# Patient Record
Sex: Male | Born: 1940 | Race: White | Hispanic: No | Marital: Married | Smoking: Former smoker
Health system: Southern US, Community
[De-identification: ages and names within clinical notes are randomized; demographics above are authoritative.]

## PROBLEM LIST (undated history)

## (undated) DIAGNOSIS — N529 Male erectile dysfunction, unspecified: Secondary | ICD-10-CM

## (undated) DIAGNOSIS — Z7901 Long term (current) use of anticoagulants: Secondary | ICD-10-CM

## (undated) DIAGNOSIS — E291 Testicular hypofunction: Secondary | ICD-10-CM

## (undated) DIAGNOSIS — Z85828 Personal history of other malignant neoplasm of skin: Secondary | ICD-10-CM

## (undated) DIAGNOSIS — M1990 Unspecified osteoarthritis, unspecified site: Secondary | ICD-10-CM

## (undated) DIAGNOSIS — Z86010 Personal history of colonic polyps: Secondary | ICD-10-CM

## (undated) DIAGNOSIS — Z860101 Personal history of adenomatous and serrated colon polyps: Secondary | ICD-10-CM

## (undated) DIAGNOSIS — Z8669 Personal history of other diseases of the nervous system and sense organs: Secondary | ICD-10-CM

## (undated) DIAGNOSIS — C439 Malignant melanoma of skin, unspecified: Secondary | ICD-10-CM

## (undated) DIAGNOSIS — Z87442 Personal history of urinary calculi: Secondary | ICD-10-CM

## (undated) DIAGNOSIS — G4733 Obstructive sleep apnea (adult) (pediatric): Secondary | ICD-10-CM

## (undated) DIAGNOSIS — Z974 Presence of external hearing-aid: Secondary | ICD-10-CM

## (undated) DIAGNOSIS — Z9989 Dependence on other enabling machines and devices: Secondary | ICD-10-CM

## (undated) DIAGNOSIS — I351 Nonrheumatic aortic (valve) insufficiency: Secondary | ICD-10-CM

## (undated) DIAGNOSIS — IMO0001 Reserved for inherently not codable concepts without codable children: Secondary | ICD-10-CM

## (undated) DIAGNOSIS — I809 Phlebitis and thrombophlebitis of unspecified site: Secondary | ICD-10-CM

## (undated) DIAGNOSIS — N401 Enlarged prostate with lower urinary tract symptoms: Secondary | ICD-10-CM

## (undated) DIAGNOSIS — I499 Cardiac arrhythmia, unspecified: Secondary | ICD-10-CM

## (undated) DIAGNOSIS — I4819 Other persistent atrial fibrillation: Secondary | ICD-10-CM

## (undated) DIAGNOSIS — T148XXA Other injury of unspecified body region, initial encounter: Secondary | ICD-10-CM

## (undated) DIAGNOSIS — D126 Benign neoplasm of colon, unspecified: Secondary | ICD-10-CM

## (undated) DIAGNOSIS — I77810 Thoracic aortic ectasia: Secondary | ICD-10-CM

## (undated) DIAGNOSIS — G709 Myoneural disorder, unspecified: Secondary | ICD-10-CM

## (undated) DIAGNOSIS — J189 Pneumonia, unspecified organism: Secondary | ICD-10-CM

## (undated) DIAGNOSIS — J302 Other seasonal allergic rhinitis: Secondary | ICD-10-CM

## (undated) DIAGNOSIS — I440 Atrioventricular block, first degree: Secondary | ICD-10-CM

## (undated) DIAGNOSIS — I10 Essential (primary) hypertension: Secondary | ICD-10-CM

## (undated) HISTORY — DX: Obstructive sleep apnea (adult) (pediatric): G47.33

## (undated) HISTORY — PX: TONSILLECTOMY: SUR1361

## (undated) HISTORY — DX: Benign neoplasm of colon, unspecified: D12.6

## (undated) HISTORY — DX: Nonrheumatic aortic (valve) insufficiency: I35.1

## (undated) HISTORY — PX: LITHOTRIPSY: SUR834

## (undated) HISTORY — PX: CATARACT EXTRACTION: SUR2

## (undated) HISTORY — PX: CYSTOSCOPY: SUR368

## (undated) HISTORY — PX: AMPUTATION FINGER: SHX6594

## (undated) HISTORY — DX: Unspecified osteoarthritis, unspecified site: M19.90

## (undated) HISTORY — DX: Other seasonal allergic rhinitis: J30.2

## (undated) HISTORY — PX: CATARACT EXTRACTION W/ INTRAOCULAR LENS IMPLANT: SHX1309

## (undated) HISTORY — DX: Malignant melanoma of skin, unspecified: C43.9

## (undated) HISTORY — PX: UPPER GI ENDOSCOPY: SHX6162

## (undated) HISTORY — DX: Other persistent atrial fibrillation: I48.19

## (undated) HISTORY — DX: Essential (primary) hypertension: I10

---

## 1968-02-12 HISTORY — PX: TONSILLECTOMY: SUR1361

## 1982-02-11 HISTORY — PX: ELBOW SURGERY: SHX618

## 1998-02-11 DIAGNOSIS — C439 Malignant melanoma of skin, unspecified: Secondary | ICD-10-CM

## 1998-02-11 DIAGNOSIS — Z85820 Personal history of malignant melanoma of skin: Secondary | ICD-10-CM

## 1998-02-11 HISTORY — DX: Personal history of malignant melanoma of skin: Z85.820

## 1998-02-11 HISTORY — PX: OTHER SURGICAL HISTORY: SHX169

## 1998-02-11 HISTORY — DX: Malignant melanoma of skin, unspecified: C43.9

## 1998-03-10 ENCOUNTER — Other Ambulatory Visit: Admission: RE | Admit: 1998-03-10 | Discharge: 1998-03-10 | Payer: Self-pay

## 1998-08-23 ENCOUNTER — Encounter (INDEPENDENT_AMBULATORY_CARE_PROVIDER_SITE_OTHER): Payer: Self-pay | Admitting: Specialist

## 1998-08-23 ENCOUNTER — Other Ambulatory Visit: Admission: RE | Admit: 1998-08-23 | Discharge: 1998-08-23 | Payer: Self-pay | Admitting: Gastroenterology

## 2000-01-12 HISTORY — PX: MELANOMA EXCISION: SHX5266

## 2000-01-28 ENCOUNTER — Ambulatory Visit (HOSPITAL_BASED_OUTPATIENT_CLINIC_OR_DEPARTMENT_OTHER): Admission: RE | Admit: 2000-01-28 | Discharge: 2000-01-28 | Payer: Self-pay | Admitting: *Deleted

## 2000-01-28 ENCOUNTER — Encounter (INDEPENDENT_AMBULATORY_CARE_PROVIDER_SITE_OTHER): Payer: Self-pay | Admitting: *Deleted

## 2001-07-12 HISTORY — PX: MASS EXCISION: SHX2000

## 2002-03-10 ENCOUNTER — Encounter: Admission: RE | Admit: 2002-03-10 | Discharge: 2002-03-10 | Payer: Self-pay | Admitting: Sports Medicine

## 2002-03-29 ENCOUNTER — Ambulatory Visit (HOSPITAL_BASED_OUTPATIENT_CLINIC_OR_DEPARTMENT_OTHER): Admission: RE | Admit: 2002-03-29 | Discharge: 2002-03-29 | Payer: Self-pay | Admitting: Sports Medicine

## 2002-12-20 ENCOUNTER — Encounter: Admission: RE | Admit: 2002-12-20 | Discharge: 2002-12-20 | Payer: Self-pay | Admitting: Family Medicine

## 2002-12-20 ENCOUNTER — Encounter: Admission: RE | Admit: 2002-12-20 | Discharge: 2002-12-20 | Payer: Self-pay | Admitting: Sports Medicine

## 2002-12-21 ENCOUNTER — Encounter: Admission: RE | Admit: 2002-12-21 | Discharge: 2002-12-21 | Payer: Self-pay | Admitting: Family Medicine

## 2002-12-28 ENCOUNTER — Encounter: Admission: RE | Admit: 2002-12-28 | Discharge: 2002-12-28 | Payer: Self-pay | Admitting: Family Medicine

## 2003-03-15 ENCOUNTER — Encounter: Admission: RE | Admit: 2003-03-15 | Discharge: 2003-03-15 | Payer: Self-pay | Admitting: Sports Medicine

## 2003-03-21 ENCOUNTER — Encounter: Admission: RE | Admit: 2003-03-21 | Discharge: 2003-03-21 | Payer: Self-pay | Admitting: Family Medicine

## 2003-05-05 ENCOUNTER — Ambulatory Visit (HOSPITAL_COMMUNITY): Admission: RE | Admit: 2003-05-05 | Discharge: 2003-05-05 | Payer: Self-pay | Admitting: Urology

## 2003-05-08 ENCOUNTER — Observation Stay (HOSPITAL_COMMUNITY): Admission: EM | Admit: 2003-05-08 | Discharge: 2003-05-09 | Payer: Self-pay | Admitting: Emergency Medicine

## 2003-05-12 ENCOUNTER — Ambulatory Visit (HOSPITAL_COMMUNITY): Admission: RE | Admit: 2003-05-12 | Discharge: 2003-05-12 | Payer: Self-pay | Admitting: Urology

## 2003-05-12 HISTORY — PX: CYSTOSCOPY W/ URETEROSCOPY W/ LITHOTRIPSY: SUR380

## 2004-01-17 ENCOUNTER — Ambulatory Visit: Payer: Self-pay | Admitting: Family Medicine

## 2004-03-06 ENCOUNTER — Ambulatory Visit: Payer: Self-pay | Admitting: Sports Medicine

## 2004-04-04 ENCOUNTER — Ambulatory Visit: Payer: Self-pay | Admitting: Sports Medicine

## 2004-04-18 ENCOUNTER — Ambulatory Visit: Payer: Self-pay | Admitting: Sports Medicine

## 2004-10-04 ENCOUNTER — Ambulatory Visit: Payer: Self-pay | Admitting: Gastroenterology

## 2004-10-17 ENCOUNTER — Ambulatory Visit: Payer: Self-pay | Admitting: Gastroenterology

## 2004-12-26 ENCOUNTER — Ambulatory Visit: Payer: Self-pay | Admitting: Family Medicine

## 2005-01-18 ENCOUNTER — Ambulatory Visit (HOSPITAL_BASED_OUTPATIENT_CLINIC_OR_DEPARTMENT_OTHER): Admission: RE | Admit: 2005-01-18 | Discharge: 2005-01-18 | Payer: Self-pay | Admitting: Urology

## 2005-01-18 ENCOUNTER — Ambulatory Visit (HOSPITAL_COMMUNITY): Admission: RE | Admit: 2005-01-18 | Discharge: 2005-01-18 | Payer: Self-pay | Admitting: Urology

## 2005-01-18 ENCOUNTER — Encounter (INDEPENDENT_AMBULATORY_CARE_PROVIDER_SITE_OTHER): Payer: Self-pay | Admitting: Specialist

## 2005-01-18 HISTORY — PX: SATURATION BIOPSY OF PROSTATE: SHX2375

## 2005-01-20 ENCOUNTER — Inpatient Hospital Stay (HOSPITAL_COMMUNITY): Admission: EM | Admit: 2005-01-20 | Discharge: 2005-01-24 | Payer: Self-pay | Admitting: Emergency Medicine

## 2005-03-26 ENCOUNTER — Ambulatory Visit: Payer: Self-pay | Admitting: Sports Medicine

## 2005-04-19 ENCOUNTER — Ambulatory Visit: Payer: Self-pay | Admitting: Family Medicine

## 2005-04-24 ENCOUNTER — Ambulatory Visit: Payer: Self-pay | Admitting: Family Medicine

## 2005-04-30 ENCOUNTER — Ambulatory Visit: Payer: Self-pay | Admitting: Sports Medicine

## 2005-05-07 ENCOUNTER — Ambulatory Visit: Payer: Self-pay | Admitting: Sports Medicine

## 2005-09-03 ENCOUNTER — Ambulatory Visit: Payer: Self-pay | Admitting: Family Medicine

## 2005-10-22 ENCOUNTER — Ambulatory Visit: Payer: Self-pay | Admitting: Sports Medicine

## 2005-10-22 ENCOUNTER — Ambulatory Visit (HOSPITAL_COMMUNITY): Admission: RE | Admit: 2005-10-22 | Discharge: 2005-10-22 | Payer: Self-pay | Admitting: Family Medicine

## 2006-04-10 DIAGNOSIS — H269 Unspecified cataract: Secondary | ICD-10-CM | POA: Insufficient documentation

## 2006-04-10 DIAGNOSIS — K5732 Diverticulitis of large intestine without perforation or abscess without bleeding: Secondary | ICD-10-CM | POA: Insufficient documentation

## 2006-04-10 DIAGNOSIS — D126 Benign neoplasm of colon, unspecified: Secondary | ICD-10-CM | POA: Insufficient documentation

## 2006-04-10 DIAGNOSIS — C439 Malignant melanoma of skin, unspecified: Secondary | ICD-10-CM | POA: Insufficient documentation

## 2006-04-10 DIAGNOSIS — N200 Calculus of kidney: Secondary | ICD-10-CM | POA: Insufficient documentation

## 2006-05-06 ENCOUNTER — Ambulatory Visit: Payer: Self-pay | Admitting: Sports Medicine

## 2006-05-06 DIAGNOSIS — R972 Elevated prostate specific antigen [PSA]: Secondary | ICD-10-CM | POA: Insufficient documentation

## 2006-05-06 DIAGNOSIS — I10 Essential (primary) hypertension: Secondary | ICD-10-CM | POA: Insufficient documentation

## 2006-05-08 LAB — CONVERTED CEMR LAB
ALT: 20 units/L (ref 0–53)
AST: 20 units/L (ref 0–37)
Albumin: 4.5 g/dL (ref 3.5–5.2)
Alkaline Phosphatase: 85 units/L (ref 39–117)
Calcium: 9.8 mg/dL (ref 8.4–10.5)
Cholesterol: 215 mg/dL — ABNORMAL HIGH (ref 0–200)
PSA: 2.58 ng/mL (ref 0.10–4.00)
Potassium: 4.3 meq/L (ref 3.5–5.3)
Sodium: 138 meq/L (ref 135–145)
Total Bilirubin: 0.6 mg/dL (ref 0.3–1.2)
Total CHOL/HDL Ratio: 4.1

## 2006-05-22 ENCOUNTER — Encounter (INDEPENDENT_AMBULATORY_CARE_PROVIDER_SITE_OTHER): Payer: Self-pay | Admitting: Sports Medicine

## 2006-06-03 ENCOUNTER — Encounter (INDEPENDENT_AMBULATORY_CARE_PROVIDER_SITE_OTHER): Payer: Self-pay | Admitting: Sports Medicine

## 2006-06-03 ENCOUNTER — Ambulatory Visit: Payer: Self-pay | Admitting: Family Medicine

## 2006-06-03 LAB — CONVERTED CEMR LAB
AST: 19 units/L (ref 0–37)
Albumin: 4.6 g/dL (ref 3.5–5.2)
Basophils Absolute: <0.2 10*3/uL
CO2: 23 meq/L (ref 19–32)
Creatinine, Ser: 1 mg/dL (ref 0.40–1.50)
Granulocyte count absolute: 5.9 10*3/uL
Granulocyte percent: 69.8 %
HCT: 41.5 %
Hemoglobin: 14.6 g/dL
Lymphs Abs: 2 10*3/uL
MCV: 92.6 fL
Monocytes Absolute: 0.5 10*3/uL
Potassium: 5 meq/L (ref 3.5–5.3)

## 2006-06-10 ENCOUNTER — Telehealth (INDEPENDENT_AMBULATORY_CARE_PROVIDER_SITE_OTHER): Payer: Self-pay | Admitting: Sports Medicine

## 2006-10-09 ENCOUNTER — Ambulatory Visit: Payer: Self-pay | Admitting: Gastroenterology

## 2006-10-21 ENCOUNTER — Ambulatory Visit: Payer: Self-pay | Admitting: Gastroenterology

## 2007-07-13 ENCOUNTER — Ambulatory Visit: Payer: Self-pay | Admitting: Family Medicine

## 2007-07-13 DIAGNOSIS — M479 Spondylosis, unspecified: Secondary | ICD-10-CM | POA: Insufficient documentation

## 2007-07-14 ENCOUNTER — Telehealth: Payer: Self-pay | Admitting: *Deleted

## 2007-07-14 ENCOUNTER — Telehealth: Payer: Self-pay | Admitting: Family Medicine

## 2007-07-14 ENCOUNTER — Encounter: Payer: Self-pay | Admitting: Family Medicine

## 2007-07-14 DIAGNOSIS — D2920 Benign neoplasm of unspecified testis: Secondary | ICD-10-CM | POA: Insufficient documentation

## 2007-07-14 LAB — CONVERTED CEMR LAB
AST: 20 units/L (ref 0–37)
Albumin: 4.5 g/dL (ref 3.5–5.2)
Glucose, Bld: 103 mg/dL — ABNORMAL HIGH (ref 70–99)
HDL: 49 mg/dL (ref >39)
LDL Cholesterol: 122 mg/dL — ABNORMAL HIGH (ref 0–99)
Potassium: 5.1 meq/L (ref 3.5–5.3)
Sodium: 140 meq/L (ref 135–145)
Total Bilirubin: 0.7 mg/dL (ref 0.3–1.2)
Total Protein: 7.4 g/dL (ref 6.0–8.3)

## 2007-07-15 ENCOUNTER — Encounter: Payer: Self-pay | Admitting: Family Medicine

## 2007-07-16 LAB — CONVERTED CEMR LAB: PSA, Free: 1 ng/mL

## 2007-08-27 ENCOUNTER — Encounter: Payer: Self-pay | Admitting: Family Medicine

## 2007-12-08 ENCOUNTER — Telehealth: Payer: Self-pay | Admitting: *Deleted

## 2008-05-13 ENCOUNTER — Ambulatory Visit: Payer: Self-pay | Admitting: Family Medicine

## 2008-05-24 ENCOUNTER — Ambulatory Visit: Payer: Self-pay | Admitting: Family Medicine

## 2008-05-27 ENCOUNTER — Encounter: Payer: Self-pay | Admitting: *Deleted

## 2008-06-20 ENCOUNTER — Ambulatory Visit: Payer: Self-pay | Admitting: Gastroenterology

## 2008-06-20 DIAGNOSIS — R1013 Epigastric pain: Secondary | ICD-10-CM | POA: Insufficient documentation

## 2008-06-20 DIAGNOSIS — K222 Esophageal obstruction: Secondary | ICD-10-CM | POA: Insufficient documentation

## 2008-06-20 LAB — CONVERTED CEMR LAB
Albumin: 3.8 g/dL (ref 3.5–5.2)
BUN: 14 mg/dL (ref 6–23)
Basophils Relative: 0.7 % (ref 0.0–3.0)
Chloride: 107 meq/L (ref 96–112)
Creatinine, Ser: 1 mg/dL (ref 0.4–1.5)
GFR calc non Af Amer: 79.05 mL/min (ref >60)
Glucose, Bld: 92 mg/dL (ref 70–99)
Hemoglobin: 13.7 g/dL (ref 13.0–17.0)
Lymphs Abs: 1.6 10*3/uL (ref 0.7–4.0)
MCHC: 35.4 g/dL (ref 30.0–36.0)
Neutro Abs: 2.3 10*3/uL (ref 1.4–7.7)
Platelets: 188 10*3/uL (ref 150.0–400.0)
Potassium: 4.5 meq/L (ref 3.5–5.1)
RBC: 4.19 M/uL — ABNORMAL LOW (ref 4.22–5.81)
Sodium: 142 meq/L (ref 135–145)
Total Bilirubin: 0.7 mg/dL (ref 0.3–1.2)
WBC: 4.7 10*3/uL (ref 4.5–10.5)

## 2008-06-21 ENCOUNTER — Ambulatory Visit: Payer: Self-pay | Admitting: Gastroenterology

## 2008-06-21 ENCOUNTER — Encounter: Payer: Self-pay | Admitting: Gastroenterology

## 2008-06-24 ENCOUNTER — Encounter: Payer: Self-pay | Admitting: Gastroenterology

## 2008-06-24 ENCOUNTER — Telehealth: Payer: Self-pay | Admitting: Gastroenterology

## 2008-07-06 ENCOUNTER — Telehealth (INDEPENDENT_AMBULATORY_CARE_PROVIDER_SITE_OTHER): Payer: Self-pay | Admitting: *Deleted

## 2008-07-06 ENCOUNTER — Ambulatory Visit: Payer: Self-pay | Admitting: Family Medicine

## 2008-07-12 ENCOUNTER — Ambulatory Visit: Payer: Self-pay | Admitting: Gastroenterology

## 2008-07-15 ENCOUNTER — Encounter: Payer: Self-pay | Admitting: Family Medicine

## 2008-08-10 ENCOUNTER — Ambulatory Visit: Payer: Self-pay | Admitting: Family Medicine

## 2008-08-10 LAB — CONVERTED CEMR LAB: PSA: 2.53 ng/mL (ref 0.10–4.00)

## 2008-08-11 ENCOUNTER — Encounter: Payer: Self-pay | Admitting: Family Medicine

## 2009-04-07 ENCOUNTER — Encounter: Payer: Self-pay | Admitting: Family Medicine

## 2009-10-04 ENCOUNTER — Ambulatory Visit: Payer: Self-pay | Admitting: Family Medicine

## 2009-10-04 DIAGNOSIS — M79609 Pain in unspecified limb: Secondary | ICD-10-CM | POA: Insufficient documentation

## 2009-10-04 DIAGNOSIS — R5383 Other fatigue: Secondary | ICD-10-CM

## 2009-10-04 DIAGNOSIS — R5381 Other malaise: Secondary | ICD-10-CM | POA: Insufficient documentation

## 2009-10-04 LAB — CONVERTED CEMR LAB
ALT: 19 units/L (ref 0–53)
AST: 29 units/L (ref 0–37)
Cholesterol: 193 mg/dL (ref 0–200)
HDL: 51 mg/dL (ref >39)
Hemoglobin: 14.9 g/dL (ref 13.0–17.0)
LDL Cholesterol: 115 mg/dL — ABNORMAL HIGH (ref 0–99)
MCHC: 35 g/dL (ref 30.0–36.0)
MCV: 90.1 fL (ref 78.0–100.0)
Platelets: 200 10*3/uL (ref 150–400)
Potassium: 4.2 meq/L (ref 3.5–5.3)
RBC: 4.73 M/uL (ref 4.22–5.81)
Sodium: 137 meq/L (ref 135–145)
Total CHOL/HDL Ratio: 3.8
Triglycerides: 136 mg/dL (ref <150)

## 2009-10-05 ENCOUNTER — Telehealth: Payer: Self-pay | Admitting: *Deleted

## 2009-10-06 ENCOUNTER — Encounter: Payer: Self-pay | Admitting: Family Medicine

## 2009-12-04 ENCOUNTER — Encounter: Payer: Self-pay | Admitting: Family Medicine

## 2009-12-20 ENCOUNTER — Encounter: Payer: Self-pay | Admitting: Family Medicine

## 2010-02-11 HISTORY — PX: TENDON REPAIR: SHX5111

## 2010-03-08 ENCOUNTER — Other Ambulatory Visit: Payer: Self-pay | Admitting: Dermatology

## 2010-03-15 NOTE — Assessment & Plan Note (Signed)
Summary: Mario [name],df   Vital Signs:  Patient profile:   70 year old male Weight:      206.6 pounds Temp:     97.8 degrees F oral Pulse rate:   63 / minute Pulse rhythm:   regular BP sitting:   152 / 95  (left arm) Cuff size:   large  Vitals Entered By: Loralee Pacas CMA (October 04, 2009 10:02 AM)    Primary Care Provider:  Albertha Ghee MD   History of Present Illness: 1) Here for check up  2) BP readings at home 135-145 / 80s. Has started training hard again as he wants to compete   one last time in weight lifting---feels this may be contributing to his elevated levels. No chest pains, no SOB. Is not running rigt now but has no exertional pain with his weigt lifting. We had him on benazepril last--he thought that may have contributed to his stomach issues--was previously on tenormin--did well with that.  3) had right triceps injury (sounds like avulsion of insertion while doing tricep extensions with 175 punds).. At the moment the triceps ruptured, he felt instant pain in his right hand. Was seen by Dr Eulah Pont who did surgery on his arm--he still has hand pain, loss of dexterity and notes hand swelling daily. Wants to see a Hydrographic surveyor.  4) Has nothad his PSA checked recently--asymptomatic. (was supposed to have a 6 m check with [place] before his f/u at [facility])  5) follow with dermatology for complete skin check every 4 months or so for his hx melanoma, BCC and one SCC.  6) follows with urology yearly at [facility]  Habits & Providers  Alcohol-Tobacco-Diet     Tobacco Status: quit     Year Quit: 1983  Current Medications (verified): 1)  Hyomax-Sl 0.125 Mg Subl (Hyoscyamine Sulfate) .... Take 2 Tabs Sublingual Q.4 H. P.r.n. 2)  Atenolol 50 Mg Tabs (Atenolol) .[name].. 1 By Mouth Once Daily  Allergies (verified): 1)  Lisinopril  Social History: Smoking Status:  quit  Review of Systems  The patient denies anorexia, fever, weight loss, hoarseness, chest pain, syncope, dyspnea on exertion,  peripheral edema, prolonged cough, headaches, hemoptysis, abdominal pain, hematochezia, and severe indigestion/heartburn.    Physical Exam  General:  alert and well-developed.   Eyes:  pupils equal, pupils round, and pupils reactive to light.   Nose:  no external deformity.   Mouth:  good dentition.   Neck:  supple, full ROM, no masses, no thyromegaly, and no carotid bruits.   Lungs:  normal respiratory effort and normal breath sounds.   Heart:  normal rate, regular rhythm, and no murmur.   Abdomen:  soft, non-tender, and normal bowel sounds.   Genitalia:  [facility] Prostate:  deferred to urology at [facility]:  well healed scar Right elbow (tricep area).  Right hand is swollen diffusely--about 10-20% larger than left. Poor exterity of finger movements right c/w left (his dominant hand is right). remaining MSK grossly without deficit  Pulses:  radial pulses 2+ B = Neurologic:  alert & oriented X3 and gait normal.  no gross focal deficit Skin:  deferred to derm for complete skin exam Psych:  Oriented X3, memory intact for recent and remote, normally interactive, good eye contact, not anxious appearing, and not depressed appearing.     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 Take 2 tabs sublingual q.4 h. p.r.n. 2)  Atenolol 50 Mg Tabs (Atenolol) .[name].. 1 by mouth once daily Lipid-[facility] [phone]) Comp Met-[facility] [phone]) PSA-[facility] [phone]) CBC-[facility] (44010) [facility]- Est  Level 4 (27253) Orthopedic Surgeon Referral (Ortho Surgeon)  Patient Instructions: 1)  We are starting you on generic tenormin--it is called atenolol. I am starting you on  a small dose. Please record 2-3 readings a week for the next few weeks--different times of day---and send them in by mail. 2)  Call me with problems relating to the new medicine. 3)  I will send you a letter about your labs and we will call you in a few days with an appointment for the hand surgeon. 4)  Geat to see you! Prescriptions: ATENOLOL 50 MG TABS (ATENOLOL) 1 by mouth once daily  #90 x 3   Entered and Authorized by:   Denny Levy MD   Signed by:   Denny Levy MD on 10/04/2009   Method used:   Electronically to        [facility] (retail)       [address]       [place], [place]  [address]       Ph: [phone]       Fax: [phone]   RxID:   [id number]    Impression & Recommendations:  His updated medication list for this problem includes:    Atenolol 50 Mg Tabs (Atenolol) .[name]... 1 by mouth once daily  Orders: Lipid-[facility] ([id number]) Comp Met-[facility] [phone]) CBC-[facility] (76283) [facility]- Est  Level 4 (15176)   Orders: Orthopedic Surgeon Referral (Ortho Surgeon)   Orders: PSA-[facility] ([id number]) [facility]- Est  Level 4 (69485)   Complete Medication List: 1)  Hyomax-sl 0.125 Mg Subl (Hyoscyamine sulfate) .... Take 2 tabs sublingual q.4 h. p.r.n. 2)  Atenolol 50 Mg Tabs (Atenolol) .[name].. 1 by mouth once daily  Other Orders: Testosterone-[facility] ([id number])   Prevention & Chronic Care Immunizations   Influenza vaccine: Not documented    Tetanus booster: 07/13/2007: given   Tetanus booster due: 07/12/2017    Pneumococcal  vaccine: given  (07/13/2007)   Pneumococcal vaccine due: None    H. zoster vaccine: Not documented  Colorectal Screening   Hemoccult: Done.  (12/12/2001)   Hemoccult due: 12/13/2002    Colonoscopy: Results: Polyp.  Results: Diverticulosis.       Location:  [facility].    (10/21/2006)   Colonoscopy due: 12/2011  Other Screening   PSA: 2.53  (08/10/2008)   PSA ordered.   PSA due due: 05/2007   Smoking status: quit  (10/04/2009)  Lipids   Total Cholesterol: 202  (07/13/2007)   LDL: 122  (07/13/2007)   LDL Direct: Not documented   HDL: 49  (07/13/2007)   Triglycerides: 157  (07/13/2007)  Hypertension   Last Blood Pressure: 152 /  95  (10/04/2009)   Serum creatinine: 1.0  (06/20/2008)   Serum potassium 4.5  (06/20/2008) CMP ordered   Self-Management Support :    Hypertension self-management support: Not documented

## 2010-03-15 NOTE — Miscellaneous (Signed)
    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

## 2010-03-15 NOTE — Consult Note (Signed)
Summary: [facility]   Imported By: De Nurse 12/20/2009 14:47:56  _____________________________________________________________________  External Attachment:    Type:   Image     Comment:   External Document

## 2010-03-15 NOTE — Progress Notes (Signed)
   Phone Note Outgoing Call   Summary of Call: DEAR WHITE TEAM please let him know his PSA was NORMAL at 2.3. I will send a note about the rest of his labs Thanks!  Denny Levy MD  October 05, 2009 10:06 AM   Follow-up for Phone Call        informed pt of results. and told him that Dr. Jennette Kettle will send a letter concerning the rest of his results Follow-up by: Loralee Pacas CMA,  October 05, 2009 11:19 AM

## 2010-03-15 NOTE — Miscellaneous (Signed)
   Clinical Lists Changes  Orders: Added new Test order of PSA-[facility] [phone]) - Signed      Complete Medication List: 1)  Hyomax-sl 0.125 Mg Subl (Hyoscyamine sulfate) .... Take 2 tabs sublingual q.4 h. p.r.n.

## 2010-03-15 NOTE — Letter (Signed)
Summary: LAB Letter  [facility]  [address]   [place], [place] [address]   Phone: [phone]  Fax: [phone]    10/06/2009  Mario [name] [address] [place], [place]  [address]  Dear [name],  Cholesterol               193 mg/dL                        ATP III Classification:           < 200        mg/dL        Desirable          200 - 239     mg/dL        Borderline High          >= 240        mg/dL        High         Triglyceride              136 mg/dL                   <629   HDL Cholesterol           51 mg/dL                    >52   Total Chol/HDL Ratio      3.8 Ratio  VLDL Cholesterol (Calc)                             27 mg/dL                    8-41  LDL Cholesterol (Calc)                          115 mg/dL                   3-244 Your cholesterol looks great. Your PSA is normal. Your testosterone is just a little low---not low enogh that I would be in a hurry to start replacement. You have history of a benign neoplasm of the testis--so it might be a good idea to ask the Urologist you see at [facility] about whether testosterone replacement would be OK for yu to take/  All of the other labs including blood sugar, hemoglobin and kidney function were normal        Sincerely,   Denny Levy MD  Appended Document: LAB Letter mailed

## 2010-03-15 NOTE — Letter (Signed)
Summary:  Letter: PSA DUE  [facility]  [address]   [place], [place] [address]   Phone: [phone]  Fax: [phone]    04/07/2009  [facility] [address] [place], [place]  [address]  Dear Mr. WESELY,  I had sent myself a note electronically to remind me that we wanted to rechek your PSA at this time. I will put in a lab order, you can come by at your convenience for a lab only test. No fasting required. If you have gotten it drawn elsewhere let me know.  Hope your shoulder is doing better.         Sincerely,   Denny Levy MD

## 2010-03-22 ENCOUNTER — Encounter: Payer: Self-pay | Admitting: *Deleted

## 2010-04-10 ENCOUNTER — Encounter: Payer: Self-pay | Admitting: Family Medicine

## 2010-04-16 ENCOUNTER — Encounter (HOSPITAL_COMMUNITY): Payer: Medicare Other | Attending: General Surgery

## 2010-04-16 ENCOUNTER — Other Ambulatory Visit: Payer: Self-pay | Admitting: General Surgery

## 2010-04-16 DIAGNOSIS — Z01810 Encounter for preprocedural cardiovascular examination: Secondary | ICD-10-CM | POA: Insufficient documentation

## 2010-04-16 DIAGNOSIS — Z01812 Encounter for preprocedural laboratory examination: Secondary | ICD-10-CM | POA: Insufficient documentation

## 2010-04-16 LAB — BASIC METABOLIC PANEL
CO2: 26 mEq/L (ref 19–32)
Calcium: 9.5 mg/dL (ref 8.4–10.5)
Creatinine, Ser: 0.59 mg/dL (ref 0.4–1.5)
GFR calc Af Amer: >60 mL/min (ref >60)
Sodium: 138 mEq/L (ref 135–145)

## 2010-04-16 LAB — DIFFERENTIAL
Basophils Relative: 1 % (ref 0–1)
Eosinophils Relative: 5 % (ref 0–5)
Lymphs Abs: 2.6 10*3/uL (ref 0.7–4.0)
Monocytes Absolute: 0.7 10*3/uL (ref 0.1–1.0)
Neutro Abs: 2.9 10*3/uL (ref 1.7–7.7)

## 2010-04-16 LAB — CBC
HCT: 41.6 % (ref 39.0–52.0)
RDW: 13 % (ref 11.5–15.5)
WBC: 6.6 10*3/uL (ref 4.0–10.5)

## 2010-04-16 LAB — SURGICAL PCR SCREEN: Staphylococcus aureus: NEGATIVE

## 2010-04-18 ENCOUNTER — Other Ambulatory Visit: Payer: Self-pay | Admitting: General Surgery

## 2010-04-18 ENCOUNTER — Ambulatory Visit (HOSPITAL_COMMUNITY)
Admission: RE | Admit: 2010-04-18 | Discharge: 2010-04-18 | Disposition: A | Discharge status: Home / Self Care | Payer: Medicare Other | Source: Ambulatory Visit | Attending: General Surgery | Admitting: General Surgery

## 2010-04-18 DIAGNOSIS — D1739 Benign lipomatous neoplasm of skin and subcutaneous tissue of other sites: Secondary | ICD-10-CM | POA: Insufficient documentation

## 2010-04-18 DIAGNOSIS — Z85820 Personal history of malignant melanoma of skin: Secondary | ICD-10-CM | POA: Insufficient documentation

## 2010-04-18 DIAGNOSIS — I10 Essential (primary) hypertension: Secondary | ICD-10-CM | POA: Insufficient documentation

## 2010-04-18 HISTORY — PX: LIPOMA EXCISION: SHX5283

## 2010-04-24 ENCOUNTER — Encounter: Payer: Self-pay | Admitting: Home Health Services

## 2010-04-26 NOTE — Op Note (Signed)
  NAME:  Mario [name], Mario [name] NO.:  [id number]  MEDICAL RECORD NO.:  [id number]           PATIENT TYPE:  O  LOCATION:  PADM                         FACILITY:  [facility]  PHYSICIAN:  Juanetta Gosling, MDDATE OF BIRTH:  [birth year]  DATE OF PROCEDURE: DATE OF DISCHARGE:                              OPERATIVE REPORT   PREOPERATIVE DIAGNOSIS:  Right thigh lipoma, 2 x 3 cm subcutaneous.  POSTOPERATIVE DIAGNOSIS:  Right thigh lipoma, 2 x 3 cm subcutaneous.  PROCEDURE:  Excision of right thigh lipoma, 2 x 3 cm subcutaneous.  SURGEON:  Juanetta Gosling, M.D.  ASSISTANT:  None.  ANESTHESIA:  General.  SPECIMENS:  Lipoma to pathology.  ESTIMATED BLOOD LOSS:  Minimal.  COMPLICATIONS:  None.  DRAINS:  None.  DISPOSITION:  Recovery room in stable condition.  INDICATIONS:  [name] is a 70 year old male with a history of a longstanding right thigh mass, begun causing symptoms, causing difficulty doing some of his activities.  He had a hard what felt like a 3 or 4 cm subcutaneous mass in his right inner thigh.  We discussed excising this in the operating room due to its location and it's filling.  PROCEDURE IN DETAIL:  After informed consent was obtained, patient was taken to the operating room.  He was administered 1 gram of intravenous cefazolin.  Sequential compression devices were placed on the lower extremities prior to induction of anesthesia.  He was then placed under general anesthesia with an LMA and was then placed in lithotomy position, with this, it was the best to identify this area.  It was on his inner thigh towards his perineum.  He then was prepped and draped in the standard sterile surgical fashion.  A surgical time-out was then performed.  I infiltrated 0.25% Marcaine and made an incision and excised the lipoma fairly easily.  Hemostasis was observed.  I closed this with a 3-0 Vicryl and 4-0 Monocryl.  Steri-Strips and a dressing was placed.   He tolerated this well, extubated in the operating room, transferred to the recovery room in stable condition.     Juanetta Gosling, MD     MCW/MEDQ  D:  04/18/2010  T:  04/18/2010  Job:  [id number]  cc:   Nestor Ramp, MD  Electronically Signed by Emelia Loron MD on 04/26/2010 09:25:34 AM

## 2010-06-29 NOTE — Op Note (Signed)
NAME:  Mario [name], Mario [name] NO.:  [id number]   MEDICAL RECORD NO.:  [id number]                   PATIENT TYPE:  AMB   LOCATION:  DAY                                  FACILITY:  [facility]   PHYSICIAN:  Sigmund I. Patsi Sears, M.D.         DATE OF BIRTH:  05-Mar-1940   DATE OF PROCEDURE:  05/12/2003  DATE OF DISCHARGE:                                 OPERATIVE REPORT   PREOPERATIVE DIAGNOSES:  Obstructed left ureter status post lithotripsy.   POSTOPERATIVE DIAGNOSES:  Obstructed left ureter status post lithotripsy.   OPERATION:  Cystourethroscopy, left retrograde pyelogram with  interpretation, removal of left ureteral orifice stone and left double J  catheter.   SURGEON:  Sigmund I. Patsi Sears, M.D.   ANESTHESIA:  General LMA.   PREPARATION:  After appropriate preanesthesia, the patient was brought to  the operating room, placed on the operating room in the dorsal supine  position where general LMA anesthesia was introduced. He was then replaced  in dorsal lithotomy position where the pubis was prepped with Betadine  solution and draped in the usual fashion.   DESCRIPTION OF PROCEDURE:  Cystourethroscopy was accomplished. Left  retrograde pyelogram was performed which showed a stone in the left lower  ureter, in the intramural portion of the ureter. There was hydronephrosis  above that, mild to moderate.  A guidewire was passed in the renal pelvis,  double J catheter passed.  In the process of opening up the tunnel of the  left ureter, a left ureteral stone was recovered. This tiny chip appeared to  be the offending ureteral obstruction.  The double J was passed, measuring 6  [place] x28 cm.  It coiled in the renal pelvis and the bladder. The patient  was awakened after given IV Toradol and taken to the recovery room in good  condition.                                               Sigmund I. Patsi Sears, M.D.    SIT/MEDQ  D:  05/12/2003  T:  05/12/2003   Job:  [id number]

## 2010-06-29 NOTE — H&P (Signed)
NAME:  ETHAN, CLAYBURN NO.:  [id number]   MEDICAL RECORD NO.:  [id number]          PATIENT TYPE:  INP   LOCATION:  1421                         FACILITY:  [facility]   PHYSICIAN:  Heloise Purpura, MD      DATE OF BIRTH:  Sep 10, 1940   DATE OF ADMISSION:  01/19/2005  DATE OF DISCHARGE:                                HISTORY & PHYSICAL   CHIEF COMPLAINT:  Fever and chills.   HISTORY:  Mr. Kronberg is a 70 year old gentleman who underwent a prostate  biopsy by Dr. Patsi Sears on Thursday under anesthesia in the operating room.  On Saturday, the patient began having severe fever and chills as well as  malaise.  I initially spoke with him and his wife over the phone.  At that  particular time, he was feeling much better.  He was not on oral antibiotics  following his procedure.  I, therefore, called in oral Cipro for the patient  to begin.  However, the patient then began developing fever and chills again  and, therefore, presented to the emergency department where he was  evaluated.   PAST MEDICAL HISTORY:  Melanoma.   PAST SURGICAL HISTORY:  1.  Left elbow surgery.  2.  Eye surgery.  3.  Removal of melanoma.  4.  Right orchiectomy.   MEDICATIONS:  None.   ALLERGIES:  No known drug allergies.   FAMILY HISTORY:  Noncontributory.   SOCIAL HISTORY:  Noncontributory.   REVIEW OF SYSTEMS:  CONSTITUTIONAL:  The patient has had significant fever  and chills.  GI: Positive for nausea. CARDIOVASCULAR: No chest pain or  palpitations.  GU: The patient denies dysuria, frequency, urgency, or  hematuria.   PHYSICAL EXAMINATION:  VITAL SIGNS:  Temperature 99.1, pulse 95,  respirations 20, blood pressure 168/71.  GENERAL:  The patient is a well-nourished, well-developed, age-appropriate  male in no acute distress.  HEENT:  Normocephalic and atraumatic.  Oropharynx clear.  LUNGS:  Clear bilaterally.  CARDIOVASCULAR:  Regular rate and rhythm.  ABDOMEN:  Soft and nondistended.   The patient does have significant  tenderness over his suprapubic region.  BACK:  No CVA tenderness.  PERINEUM:  Tender.  RECTAL:  Deferred secondary to acute probable prostatitis.  EXTREMITIES:  No edema.  NEUROLOGIC:  Grossly intact.   LABORATORY STUDIES:  The patient's white blood count is currently 10.5.  Blood cultures and urine cultures are currently pending.  Urinalysis  demonstrated too-numerous-to-count red blood cells, 7 to 10 white blood  cells, and many bacteria.   IMPRESSION:  Acute prostatitis status post prostate biopsy.   PLAN:  1.  Urine cultures and blood cultures have been sent.  2.  The patient will be started on IV ciprofloxacin and managed with IV      fluid hydration.  3.  We will plan to follow him clinically.  If he appears to be clinically      worsening, we will consider broadening his antibiotic coverage.      However, due to the fact that he had not been on Cipro upon presentation  to the emergency department, we will begin with ciprofloxacin to cover      the usual organisms pending culture results.           ______________________________  Heloise Purpura, MD  Electronically Signed     LB/MEDQ  D:  01/20/2005  T:  01/20/2005  Job:  [id number]

## 2010-06-29 NOTE — Op Note (Signed)
NAME:  ERYCK, NEGRON NO.:  [id number]   MEDICAL RECORD NO.:  [id number]          PATIENT TYPE:  AMB   LOCATION:  [facility]                         FACILITY:  [facility]   PHYSICIAN:  Sigmund I. Patsi Sears, M.D.DATE OF BIRTH:  February 02, 1941   DATE OF PROCEDURE:  01/18/2005  DATE OF DISCHARGE:                                 OPERATIVE REPORT   PREOPERATIVE DIAGNOSIS:  PSA elevation.   POSTOPERATIVE DIAGNOSIS:  PSA elevation.   OPERATION:  Transrectal needle ultrasound, biopsy of the prostate.   SURGEON:  Dr. Patsi Sears   ANESTHESIA:  General LMA.   PREPARATION:  After appropriate preanesthesia, the patient is brought to the  operating room, placed on the operating table in dorsal supine position  where general LMA anesthesia was introduced.  He was then re-placed in the  left lateral decubitus position where a transrectal ultrasound was  accomplished.   HISTORY:  This 70 year old male has a history of BPH and significant bladder  outlet obstruction symptoms. However, he was noted to have an elevated PSA  velocity with a current PSA of 5.7, for prostate ultrasound and of biopsy.   PROCEDURE:  The prostate is ultrasounded in the horizontal and longitudinal  positions. The rectal wall and seminal vesicals are normal. The prostate is  biopsied x12, and the prostate is mapped. The patient tolerated the  procedure with no bleeding noted. He will be notified with results. The  prostate size is pending.      Sigmund I. Patsi Sears, M.D.  Electronically Signed     SIT/MEDQ  D:  01/18/2005  T:  01/18/2005  Job:  [id number]

## 2010-06-29 NOTE — Discharge Summary (Signed)
NAMEMarland Kitchen  JONAVEN, HILGERS NO.:  [id number]   MEDICAL RECORD NO.:  [id number]          PATIENT TYPE:  INP   LOCATION:  1421                         FACILITY:  [facility]   PHYSICIAN:  Heloise Purpura, MD      DATE OF BIRTH:  1941-02-07   DATE OF ADMISSION:  01/19/2005  DATE OF DISCHARGE:  01/24/2005                                 DISCHARGE SUMMARY   ADMISSION DIAGNOSIS:  Acute prostatitis.   DISCHARGE DIAGNOSIS:  Acute prostatitis.   HISTORY:  For full details, please see admission history and physical.  Briefly, Mario [name] is a 70 year old gentleman who had a prostate biopsy  performed by Dr. Patsi Sears. He subsequently developed shaking, chills and  fever. He was evaluated in the emergency room and therefore admitted  secondary to his febrile acute prostatitis. He was admitted for management  with IV antibiotics and IV fluid hydration.   HOSPITAL COURSE:  The patient was initially placed on IV ciprofloxacin and  intravenous fluids. Over the course of the next two days, the patient  continued to remain slightly febrile. A urine culture which was obtained  upon admission demonstrated E-coli which was resistant to ciprofloxacin but  sensitive to trimethoprim sulfamethoxazole. Following adjustment of his  antibiotic therapy, the patient subsequently defervesced by January 23, 2005 and remained afebrile on January 24, 2005. He was therefore able to be  discharged home on oral antibiotics.   DISPOSITION:  Home.   DISCHARGE MEDICATIONS:  The patient was instructed to resume his regular  home medications. He was also given a prescription for 14-day course of  trimethoprim sulfamethoxazole.   FOLLOW UP:  He was instructed to follow-up with Dr. Patsi Sears as instructed  for further evaluation.           ______________________________  Heloise Purpura, MD  Electronically Signed     LB/MEDQ  D:  03/13/2005  T:  03/14/2005  Job:  [id number]

## 2010-07-25 ENCOUNTER — Ambulatory Visit (INDEPENDENT_AMBULATORY_CARE_PROVIDER_SITE_OTHER): Payer: BC Managed Care – PPO | Admitting: Family Medicine

## 2010-07-25 VITALS — BP 140/81 | HR 71 | Temp 97.6°F | Ht 72.0 in | Wt 206.0 lb

## 2010-07-25 DIAGNOSIS — R300 Dysuria: Secondary | ICD-10-CM

## 2010-07-25 DIAGNOSIS — R509 Fever, unspecified: Secondary | ICD-10-CM

## 2010-07-25 DIAGNOSIS — R61 Generalized hyperhidrosis: Secondary | ICD-10-CM

## 2010-07-25 LAB — URINALYSIS, ROUTINE W REFLEX MICROSCOPIC
Bilirubin Urine: NEGATIVE
Ketones, ur: NEGATIVE mg/dL
Leukocytes, UA: NEGATIVE
Urobilinogen, UA: 0.2 mg/dL (ref 0.0–1.0)
pH: 5.5 (ref 5.0–8.0)

## 2010-07-25 LAB — COMPREHENSIVE METABOLIC PANEL
Albumin: 4.4 g/dL (ref 3.5–5.2)
CO2: 24 mEq/L (ref 19–32)
Potassium: 4.4 mEq/L (ref 3.5–5.3)
Sodium: 138 mEq/L (ref 135–145)
Total Bilirubin: 0.5 mg/dL (ref 0.3–1.2)
Total Protein: 7 g/dL (ref 6.0–8.3)

## 2010-07-25 LAB — CBC
HCT: 42.3 % (ref 39.0–52.0)
MCH: 30.5 pg (ref 26.0–34.0)
MCV: 89.6 fL (ref 78.0–100.0)
RDW: 13.6 % (ref 11.5–15.5)
WBC: 10.8 10*3/uL — ABNORMAL HIGH (ref 4.0–10.5)

## 2010-07-25 NOTE — Patient Instructions (Signed)
Please make an appointment to see me in 2 weeks STAFF: OK to dbl book THANKS!

## 2010-07-26 ENCOUNTER — Other Ambulatory Visit (INDEPENDENT_AMBULATORY_CARE_PROVIDER_SITE_OTHER): Payer: Medicare Other | Admitting: Family Medicine

## 2010-07-26 ENCOUNTER — Encounter: Payer: Self-pay | Admitting: Family Medicine

## 2010-07-26 DIAGNOSIS — N410 Acute prostatitis: Secondary | ICD-10-CM

## 2010-07-26 LAB — DIFFERENTIAL
Basophils Absolute: 0.1 10*3/uL (ref 0.0–0.1)
Basophils Relative: 1 % (ref 0–1)
Eosinophils Relative: 1 % (ref 0–5)
Monocytes Relative: 9 % (ref 3–12)

## 2010-07-26 MED ORDER — SULFAMETHOXAZOLE-TMP DS 800-160 MG PO TABS: 1.0000 | ORAL_TABLET | Freq: Two times a day (BID) | ORAL | Status: AC

## 2010-07-26 MED ORDER — ALFUZOSIN HCL ER 10 MG PO TB24: 10.0000 mg | ORAL_TABLET | Freq: Every day | ORAL | Status: AC

## 2010-07-26 MED ORDER — CIPROFLOXACIN HCL 250 MG PO TABS: 250.0000 mg | ORAL_TABLET | Freq: Two times a day (BID) | ORAL | Status: AC

## 2010-07-26 NOTE — Progress Notes (Signed)
  Subjective:    Patient ID: Mario [name], male    DOB: 10-14-1940, 70 y.o.   MRN: [id number]  HPI  Followup recent diagnosis of pneumonia. For the last 6-8 weeks he's had episodic periods of cough with fatigue and fever. Started after he sprayed his house with some would finish. He was seen at an urgent care center and told he had pneumonia. Placed on levofloxacin and an inhaler. He feels he is getting some better. He still having night sweats that are fairly severe. He has also had some burning on urination that has gotten some better since he started on the antibiotic.  Review of Systems    positive for fever,  Sweats, fatigue. Has had cough but that is improving. Positive for dysuria that is also improving. Denies headache or chest pains or shortness of breath. Objective:   Physical Exam GENERALl: Well developed, well nourished, in no acute distress. NECK: Supple, FROM, without lymphadenopathy.  THYROID: normal without nodularity CAROTID ARTERIES: without bruits LUNGS: clear to auscultation bilaterally. No wheezes or rales. HEART: Regular rate and rhythm, no murmurs ABDOMEN: soft with positive bowel sounds NEURO: No gross focal deficits         Assessment & Plan:  #1: Diagnosis of pneumonia and placed on levofloxacin at urgent care center. He is on day 5 of antibiotic. Continues to have night sweats and some cough fatigue. In review he has had symptoms for about 6 weeks that are intermittent. Lung exam today was totally clear. We'll have him complete the levofloxacin. Gets some lab work today in followup one week. I would also like to get his records from the urgent care center including his chest x-ray.

## 2010-07-27 LAB — URINE CULTURE: Colony Count: NO GROWTH

## 2010-08-10 ENCOUNTER — Ambulatory Visit (INDEPENDENT_AMBULATORY_CARE_PROVIDER_SITE_OTHER): Payer: BC Managed Care – PPO | Admitting: Family Medicine

## 2010-08-10 DIAGNOSIS — R972 Elevated prostate specific antigen [PSA]: Secondary | ICD-10-CM

## 2010-08-10 DIAGNOSIS — N410 Acute prostatitis: Secondary | ICD-10-CM

## 2010-08-14 NOTE — Progress Notes (Signed)
  Subjective:    Patient ID: Mario [name], male    DOB: [birth year], 70 y.o.   MRN: [id number]  HPI Followup of respiratory infection that was partially treated when I saw him initially a few weeks ago. I also in the interim start him on some antibiotics for what I think is a prostatitis. He has continued to have night sweats up until 2 nights ago and he still having occasional chills at night. He reports feeling almost back to his baseline for the first time in many weeks. He's had no urinary burning or difficulties. His cough is gone. His energy level is improved.   Review of Systems     See history of present illness. Objective:   Physical Exam    GENERAL: Well-developed male no acute distress LUNGS: Clear to auscultation bilaterally CARDIOVASCULAR: Regular rate and rhythm no murmur ABDOMEN: Soft nontender positive bowel sounds    Assessment & Plan:  #1 prostatitis. I still have not received the records from where he was diagnosed with "pneumonia". Given the whole clinical picture I certainly think a prostatitis preceded any respiratory infection. He is going to bring those records with him next time. Given the fact that he is just now starting to improve he'll continue his antibiotic therapy for his otitis or 4 more weeks and see him back at that point. #2. Elevated PSA. He has had this in the past reviewed his records with him today. It is entirely possible that the prostatitis is causing this elevation. We will need to follow this very closely and I plan to check PSA one week after he has completed antibiotic therapy or at the next office visit. I do not expect it to be fully resolve but should be trending down.  to follow it long-term and make sure he returns to his baseline.  We discussed his weight lifting which certainly may be an inciting factor. He tends to do squats with over 300 or 350 pounds. It is possible that during this time he is getting bacterial movement across the wall of  the prostate from the surrounding gastrointestinal and GU structures. We spent a long time discussing this, and  he says he is going to retire the heavy weights and stick to squats with 135 or less

## 2010-09-06 ENCOUNTER — Other Ambulatory Visit: Payer: Self-pay | Admitting: Dermatology

## 2010-09-26 ENCOUNTER — Encounter: Payer: Self-pay | Admitting: Family Medicine

## 2010-09-26 ENCOUNTER — Ambulatory Visit (INDEPENDENT_AMBULATORY_CARE_PROVIDER_SITE_OTHER): Payer: BC Managed Care – PPO | Admitting: Family Medicine

## 2010-09-26 VITALS — BP 137/84 | HR 56 | Ht 72.0 in | Wt 209.0 lb

## 2010-09-26 DIAGNOSIS — N419 Inflammatory disease of prostate, unspecified: Secondary | ICD-10-CM

## 2010-09-26 LAB — POCT URINALYSIS DIPSTICK
Blood, UA: NEGATIVE
Glucose, UA: NEGATIVE
Leukocytes, UA: NEGATIVE
Protein, UA: NEGATIVE
Spec Grav, UA: 1.02
Urobilinogen, UA: 0.2
pH, UA: 5.5

## 2010-09-26 NOTE — Progress Notes (Signed)
  Subjective:    Patient ID: Mario [name], male    DOB: 08-23-1940, 70 y.o.   MRN: [id number]  HPI  Followup prostatitis. Energy level is better. No longer having sweats and chills at night. Still having a fair amount of pain when he sits. No penile discharge.  Review of Systems No unusual weight loss or gain. No burning on urination.    Objective:   Physical Exam  Genital well developed male no acute distress Abdomen soft positive bowel sounds We deferred GU exam today his fever he decided that he is going to see the urologist and it would not change our management.     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 On Sunday for complete resolution of symptoms. I think he would best be served by following up with his urologist Dr. Leta Jungling and he will make that appointment today.

## 2010-09-27 ENCOUNTER — Telehealth: Payer: Self-pay | Admitting: Family Medicine

## 2010-09-27 NOTE — Telephone Encounter (Signed)
Dear [facility] Team Tell him his PSA is [facility] better--the PSA is 3.2. Tell him I STILL want him to seethe Dr at [facility]! Denny Levy

## 2010-09-27 NOTE — Telephone Encounter (Signed)
Spoke with patient and informed him of below 

## 2010-10-17 ENCOUNTER — Encounter: Payer: Self-pay | Admitting: Family Medicine

## 2010-10-17 NOTE — Progress Notes (Signed)
  Subjective:    Patient ID: Mario [name], male    DOB: 23-Feb-1940, 70 y.o.   MRN: [id number]  HPI    Review of Systems     Objective:   Physical Exam        Assessment & Plan:  PSA at [facility] 3.2 on 10/11/2010

## 2010-10-31 ENCOUNTER — Other Ambulatory Visit: Payer: Self-pay | Admitting: Family Medicine

## 2010-11-05 ENCOUNTER — Other Ambulatory Visit: Payer: Self-pay | Admitting: Family Medicine

## 2010-11-05 MED ORDER — ATENOLOL 50 MG PO TABS: 50.0000 mg | ORAL_TABLET | Freq: Every day | ORAL | Status: DC

## 2011-01-17 ENCOUNTER — Other Ambulatory Visit: Payer: Self-pay | Admitting: Dermatology

## 2011-02-12 DIAGNOSIS — D126 Benign neoplasm of colon, unspecified: Secondary | ICD-10-CM

## 2011-02-12 HISTORY — DX: Benign neoplasm of colon, unspecified: D12.6

## 2011-10-23 ENCOUNTER — Encounter: Payer: Self-pay | Admitting: Family Medicine

## 2011-10-23 ENCOUNTER — Ambulatory Visit (INDEPENDENT_AMBULATORY_CARE_PROVIDER_SITE_OTHER): Payer: BC Managed Care – PPO | Admitting: Family Medicine

## 2011-10-23 VITALS — BP 148/62 | HR 65 | Temp 99.4°F | Ht 72.0 in | Wt 209.0 lb

## 2011-10-23 DIAGNOSIS — H9209 Otalgia, unspecified ear: Secondary | ICD-10-CM

## 2011-10-23 DIAGNOSIS — H6120 Impacted cerumen, unspecified ear: Secondary | ICD-10-CM

## 2011-10-23 DIAGNOSIS — Z23 Encounter for immunization: Secondary | ICD-10-CM

## 2011-10-23 DIAGNOSIS — I10 Essential (primary) hypertension: Secondary | ICD-10-CM

## 2011-10-23 LAB — BASIC METABOLIC PANEL
CO2: 24 mEq/L (ref 19–32)
Calcium: 9.8 mg/dL (ref 8.4–10.5)
Creat: 1.13 mg/dL (ref 0.50–1.35)

## 2011-10-23 MED ORDER — HYDROCORTISONE-ACETIC ACID 1-2 % OT SOLN: 3.0000 [drp] | Freq: Two times a day (BID) | OTIC | Status: AC

## 2011-10-23 MED ORDER — ATENOLOL 100 MG PO TABS: 100.0000 mg | ORAL_TABLET | Freq: Every day | ORAL | Status: DC

## 2011-10-23 MED ORDER — ATENOLOL 50 MG PO TABS: 100.0000 mg | ORAL_TABLET | Freq: Every day | ORAL | Status: DC

## 2011-10-24 NOTE — Progress Notes (Signed)
  Subjective:    Patient ID: Mario [name], male    DOB: 1940/08/02, 71 y.o.   MRN: [id number]  HPI  #1 followup hypertension. He brings with him several blood pressure readings which range from about 1:30 to 145 systolic and 70-85 diastolic. He's having no chest pain. No shortness of breath. No lower stream any edema. He is still quite active lifting heavy weights. He is doing a 6 her 50 pound weight press and can benchpress 300 pounds. He is no longer doing squats or cleaning jerk, both of which we thought contributed to his recurrent prostatitis. He has recently seen his urologist and had a PSA done that was actually improved from his previous. #2. Low testosterone. His urologist is giving him AndroGel. Feels like this is made a significant improvement in his fatigue level and general sense of well-being. He's having no problems with that. He was concerned this might elevate his PSA but in fact his PSA on last check was fine. Does note some facial redness associated with this replacement therapy and has questions about whether or not that means his blood pressure is elevated as well. #3. Bilateral ear itching and some decreased hearing particularly noted in the right ear going on several months.  Review of Systems Denies unusual weight change, fevers, sweats, chills. See history of present also above for additional partner review of systems.    Objective:   Physical Exam  Vital signs reviewed. GENERAL: Well developed, well nourished, no acute distress PROGRESS or: Regular rate and rhythm without murmur gallop or rub LUNGS: Clear to auscultation bilaterally without wheeze ABDOMEN: Soft positive bowel sounds nontender nondistended SKIN: Some generalized erythema of the face without lesion. EARS: There is cerumen occluding the right ear canal. Once as removed his TM looks fairly normal. Both ear canal seem quite irritated but without sign of infection.      Assessment & Plan:  Decreased  hearing in the right ear likely secondary to cerumen impaction which was removed. Both ear canals are somewhat irritated I think this is the cause of his discomfort. We'll treat that with some VoSoL.

## 2011-10-24 NOTE — Assessment & Plan Note (Signed)
Not quite ideal control. He has been on 50 mg of atenolol. I will take him to 100 mg and he will followup in one month. I'm concerned that we might drop his blood pressure and pulse a little too low, which would interfere with his weightlifting activities. Alternatively we could consider other medication or other dosing.

## 2011-10-28 ENCOUNTER — Encounter: Payer: Self-pay | Admitting: Family Medicine

## 2011-10-31 ENCOUNTER — Encounter: Payer: Self-pay | Admitting: Gastroenterology

## 2011-11-21 ENCOUNTER — Ambulatory Visit (AMBULATORY_SURGERY_CENTER): Payer: BC Managed Care – PPO | Admitting: *Deleted

## 2011-11-21 VITALS — Ht 72.0 in | Wt 212.6 lb

## 2011-11-21 DIAGNOSIS — Z1211 Encounter for screening for malignant neoplasm of colon: Secondary | ICD-10-CM

## 2011-11-21 MED ORDER — NA SULFATE-K SULFATE-MG SULF 17.5-3.13-1.6 GM/177ML PO SOLN: ORAL | Status: DC

## 2011-12-05 ENCOUNTER — Encounter: Payer: Self-pay | Admitting: Gastroenterology

## 2011-12-05 ENCOUNTER — Ambulatory Visit (AMBULATORY_SURGERY_CENTER): Payer: Medicare Other | Admitting: Gastroenterology

## 2011-12-05 VITALS — BP 110/60 | HR 56 | Temp 98.2°F | Resp 21 | Ht 72.0 in | Wt 212.0 lb

## 2011-12-05 DIAGNOSIS — Z1211 Encounter for screening for malignant neoplasm of colon: Secondary | ICD-10-CM

## 2011-12-05 DIAGNOSIS — K5730 Diverticulosis of large intestine without perforation or abscess without bleeding: Secondary | ICD-10-CM

## 2011-12-05 DIAGNOSIS — D126 Benign neoplasm of colon, unspecified: Secondary | ICD-10-CM

## 2011-12-05 MED ORDER — SODIUM CHLORIDE 0.9 % IV SOLN: 500.0000 mL | INTRAVENOUS | Status: DC

## 2011-12-05 NOTE — Op Note (Signed)
[facility] [address] [place], [address]   COLONOSCOPY PROCEDURE REPORT  PATIENT: Mario [name], Mario [name]  MR#: [id number] BIRTHDATE: 09-02-1940 , 71  yrs. old GENDER: Male ENDOSCOPIST: Louis Meckel, MD REFERRED AO:ZHYQ Neal, M.D. PROCEDURE DATE:  12/05/2011 PROCEDURE:   Colonoscopy with cold biopsy polypectomy ASA CLASS:   Class II INDICATIONS: MEDICATIONS: MAC sedation, administered by CRNA and propofol (Diprivan) 300mg  IV  DESCRIPTION OF PROCEDURE:   After the risks benefits and alternatives of the procedure were thoroughly explained, informed consent was obtained.  A digital rectal exam revealed no abnormalities of the rectum.   The LB CF-[id number]  endoscope was introduced through the anus and advanced to the cecum, which was identified by both the appendix and ileocecal valve. No adverse events experienced.   The quality of the prep was Suprep excellent The instrument was then slowly withdrawn as the colon was fully examined.      COLON FINDINGS: A sessile polyp measuring 2 mm in size was found in the sigmoid colon.  A polypectomy was performed with cold forceps. The resection was complete and the polyp tissue was completely retrieved.   Moderate diverticulosis was noted in the sigmoid colon.   Mild diverticulosis was noted at the cecum, in the transverse colon, and descending colon.  Retroflexed views revealed no abnormalities. The time to cecum=4 minutes 39 seconds. Withdrawal time=6 minutes 36 seconds.  The scope was withdrawn and the procedure completed. COMPLICATIONS: There were no complications.  ENDOSCOPIC IMPRESSION: 1.   Sessile polyp measuring 2 mm in size was found in the sigmoid colon; polypectomy was performed with cold forceps 2.   Moderate diverticulosis was noted in the sigmoid colon 3.   Mild diverticulosis was noted at the cecum, in the transverse colon, and descending colon  RECOMMENDATIONS: If the polyp(s) removed today  are proven to be adenomatous (pre-cancerous) polyps, you will need a repeat colonoscopy in 5 years.  Otherwise you should continue to follow colorectal cancer screening guidelines for "routine risk" patients with colonoscopy in 10 years.  You will receive a letter within 1-2 weeks with the results of your biopsy as well as final recommendations.  Please call my office if you have not received a letter after 3 weeks.   eSigned:  Louis Meckel, MD 12/05/2011 11:12 AM   cc:

## 2011-12-05 NOTE — Patient Instructions (Addendum)

## 2011-12-05 NOTE — Progress Notes (Addendum)
Patient did not have preoperative order for IV antibiotic SSI prophylaxis. (G8918)  Patient did not experience any of the following events: a burn prior to discharge; a fall within the facility; wrong site/side/patient/procedure/implant event; or a hospital transfer or hospital admission upon discharge from the facility. (G8907)  

## 2011-12-06 ENCOUNTER — Telehealth: Payer: Self-pay | Admitting: *Deleted

## 2011-12-06 NOTE — Telephone Encounter (Signed)
  Follow up Call-  Call back number 12/05/2011  Post procedure Call Back phone  # [phone]  Permission to leave phone message Yes     Patient questions:  Do you have a fever, pain , or abdominal swelling? no Pain Score  0 *  Have you tolerated food without any problems? yes  Have you been able to return to your normal activities? yes  Do you have any questions about your discharge instructions: Diet   no Medications  no Follow up visit  no  Do you have questions or concerns about your Care? no  Actions: * If pain score is 4 or above: No action needed, pain <4.

## 2011-12-11 ENCOUNTER — Encounter: Payer: Self-pay | Admitting: Gastroenterology

## 2011-12-14 ENCOUNTER — Encounter: Payer: Self-pay | Admitting: Family Medicine

## 2011-12-18 ENCOUNTER — Encounter: Payer: Self-pay | Admitting: Family Medicine

## 2011-12-18 ENCOUNTER — Ambulatory Visit (INDEPENDENT_AMBULATORY_CARE_PROVIDER_SITE_OTHER): Payer: BC Managed Care – PPO | Admitting: Family Medicine

## 2011-12-18 VITALS — BP 140/84 | HR 63 | Temp 97.8°F | Ht 72.0 in | Wt 216.0 lb

## 2011-12-18 DIAGNOSIS — I10 Essential (primary) hypertension: Secondary | ICD-10-CM

## 2011-12-18 MED ORDER — ATENOLOL 100 MG PO TABS: ORAL_TABLET | ORAL | Status: DC

## 2011-12-18 NOTE — Progress Notes (Signed)
  Subjective:    Patient ID: Mario [name], male    DOB: 1941-01-01, 71 y.o.   MRN: [id number]  HPI #1. Followup hypertension. Had increased his medication at last  office visit. Somehow he got our communications confused and he ended up taking 150 mg of the atenolol daily. He brings his blood pressure readings today and they're actually quite good although still occasionally he's in the high 130s systolic. He feels much better with less headache, less palpitations on this dose.   Review of Systems Denies chest pain, has had decrease in palpitations. No lower extremity edema, shortness of breath.    Objective:   Physical Exam  Vital signs reviewed. GENERAL: Well-developed, well-nourished, no acute distress. CARDIOVASCULAR: Regular rate and rhythm no murmur gallop or rub LUNGS: Clear to auscultation bilaterally, no rales or wheeze. ABDOMEN: Soft positive bowel sounds NEURO: No gross focal neurological deficits. MSK: Movement of extremity x 4.        Assessment & Plan:

## 2011-12-18 NOTE — Assessment & Plan Note (Signed)
He is currently doing extremely well on the 150 mg dose of atenolol. We'll continue that dose as long as his insurance is willing to pay for it.  New  Prescription given. Followup 6 months. He is due for a cholesterol check today

## 2012-01-22 ENCOUNTER — Other Ambulatory Visit: Payer: Self-pay | Admitting: Dermatology

## 2012-02-26 ENCOUNTER — Encounter: Payer: Self-pay | Admitting: Family Medicine

## 2012-02-26 ENCOUNTER — Ambulatory Visit (INDEPENDENT_AMBULATORY_CARE_PROVIDER_SITE_OTHER): Payer: BC Managed Care – PPO | Admitting: Family Medicine

## 2012-02-26 VITALS — BP 153/63 | HR 69 | Temp 97.8°F | Ht 72.0 in | Wt 214.7 lb

## 2012-02-26 DIAGNOSIS — K219 Gastro-esophageal reflux disease without esophagitis: Secondary | ICD-10-CM

## 2012-02-26 DIAGNOSIS — M79605 Pain in left leg: Secondary | ICD-10-CM

## 2012-02-26 DIAGNOSIS — R51 Headache: Secondary | ICD-10-CM

## 2012-02-26 DIAGNOSIS — R519 Headache, unspecified: Secondary | ICD-10-CM

## 2012-02-26 DIAGNOSIS — M79609 Pain in unspecified limb: Secondary | ICD-10-CM

## 2012-02-26 DIAGNOSIS — K222 Esophageal obstruction: Secondary | ICD-10-CM

## 2012-02-26 MED ORDER — CYCLOBENZAPRINE HCL 10 MG PO TABS: ORAL_TABLET | ORAL | Status: DC

## 2012-02-26 MED ORDER — LANSOPRAZOLE 30 MG PO TBDP: 30.0000 mg | ORAL_TABLET | Freq: Every day | ORAL | Status: DC

## 2012-02-26 NOTE — Patient Instructions (Addendum)
I am calling ion some flexeril---take one at night and you may take one during day for headache. It will make you sleepy. I am also calling in some prevacid--one a day for at least 4 weeks. Let me see you back in 4-6 weeks. Call sooner if you have another fever or sweats.

## 2012-03-02 ENCOUNTER — Encounter: Payer: Self-pay | Admitting: Family Medicine

## 2012-03-02 ENCOUNTER — Ambulatory Visit (HOSPITAL_COMMUNITY)
Admission: RE | Admit: 2012-03-02 | Discharge: 2012-03-02 | Disposition: A | Discharge status: Home / Self Care | Payer: Medicare Other | Source: Ambulatory Visit | Attending: Family Medicine | Admitting: Family Medicine

## 2012-03-02 DIAGNOSIS — I82819 Embolism and thrombosis of superficial veins of unspecified lower extremities: Secondary | ICD-10-CM | POA: Insufficient documentation

## 2012-03-02 DIAGNOSIS — M7989 Other specified soft tissue disorders: Secondary | ICD-10-CM

## 2012-03-02 DIAGNOSIS — M79605 Pain in left leg: Secondary | ICD-10-CM

## 2012-03-02 DIAGNOSIS — M79609 Pain in unspecified limb: Secondary | ICD-10-CM

## 2012-03-02 NOTE — Progress Notes (Signed)
  Subjective:    Patient ID: Mario [name], male    DOB: 1940-04-17, 72 y.o.   MRN: [id number]  HPI #1. Headaches for 3 weeks. They start in the posterior base of his head and proceed upward. Associated with neck pain. Does not awaken with them but they use a start midmorning and last all day. He's tried [employer] which seemed to help some. #2. Has noticed some lesions on his left leg. Unsure what they are. Has also had some mild pain in his left leg intermittently over the last 2-3 weeks. #3. Increased problems with acid reflux. Bothers him most at night.  Review of Systems Denies fever sweats, chills denies locking or giving way of his knees. Denies visual changes, no shortness of breath, no chest pain. Vital signs are reviewed     Objective:   Physical Exam  GENERAL: Well-developed male no acute distress NECK: Full range of motion in flexion extension and lateral rotation. Negative Spurling's. He does have a lot of tight musculature in the posterior portion of his neck in his strep easiest. CARDIOVASCULAR: Regular rate and rhythm LUNGS: Clear to auscultation bilaterally EXTREMITY: Left lower extremity reveals a few macules that are reddish brown; no more than 4 mm in diameter and fairly sparse, no more than 10 lesions on the entire leg. Calf is nontender. He does have some varicosities. Negative Homans sign. Distally he is neurovascularly intact in both feet.      Assessment & Plan:  #1. Headaches. He has history of some arthritis in his Think these are related to musculoskeletal issues. We'll try him on some Flexeril and see if this improves. #2. He may have a mild superficial thrombophlebitis that is resolving. I'm not really concerned that he has a DVT but I think it prudent to get venous Dopplers we'll set those up. I do not think they need to the urgent. He macules appear benign. We will follow them. I'll see him back in one month #3. Regarding his reflux we'll start him on  previously.

## 2012-03-02 NOTE — Progress Notes (Signed)
*  PRELIMINARY RESULTS* Vascular Ultrasound Left lower extremity venous duplex has been completed.  Preliminary findings: Left:  No evidence of DVT or Baker's cyst. Superficial thrombosis is noted in a varicosity of the upper calf.   Farrel Demark, RDMS, RVT 03/02/2012, 10:03 AM

## 2012-03-03 ENCOUNTER — Encounter: Payer: Self-pay | Admitting: Family Medicine

## 2012-04-01 ENCOUNTER — Encounter: Payer: Self-pay | Admitting: Family Medicine

## 2012-04-01 ENCOUNTER — Ambulatory Visit (INDEPENDENT_AMBULATORY_CARE_PROVIDER_SITE_OTHER): Payer: BC Managed Care – PPO | Admitting: Family Medicine

## 2012-04-01 VITALS — BP 144/73 | HR 68 | Temp 97.9°F | Ht 72.0 in | Wt 215.1 lb

## 2012-04-01 DIAGNOSIS — M479 Spondylosis, unspecified: Secondary | ICD-10-CM

## 2012-04-01 DIAGNOSIS — M25579 Pain in unspecified ankle and joints of unspecified foot: Secondary | ICD-10-CM

## 2012-04-01 DIAGNOSIS — K219 Gastro-esophageal reflux disease without esophagitis: Secondary | ICD-10-CM

## 2012-04-02 ENCOUNTER — Encounter: Payer: Self-pay | Admitting: Family Medicine
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
## 2012-04-29 ENCOUNTER — Telehealth: Payer: Self-pay | Admitting: Family Medicine

## 2012-04-29 NOTE — Telephone Encounter (Signed)
Pt has pain in both elbows and is asking for anti inflammatory or something else - not sure if he needs to be seen first - they are asking to check with Jennette Kettle first

## 2012-04-30 MED ORDER — DICLOFENAC SODIUM 75 MG PO TBEC: 75.0000 mg | DELAYED_RELEASE_TABLET | Freq: Two times a day (BID) | ORAL | Status: DC

## 2012-04-30 NOTE — Telephone Encounter (Signed)
Left message on patient's voicemail.Kennya Schwenn S  

## 2012-04-30 NOTE — Telephone Encounter (Signed)
Dear [facility] A[facility] Team I have sent in some diclofenac. Do NOT Take any OTC advil, ibuprofen or aleve with  It.If elbows not getting better, needs appt THANKS! Denny Levy

## 2012-06-17 ENCOUNTER — Ambulatory Visit (INDEPENDENT_AMBULATORY_CARE_PROVIDER_SITE_OTHER): Payer: BC Managed Care – PPO | Admitting: Family Medicine

## 2012-06-17 ENCOUNTER — Encounter: Payer: Self-pay | Admitting: Family Medicine

## 2012-06-17 VITALS — BP 151/61 | HR 62 | Temp 97.4°F | Ht 72.0 in | Wt 214.1 lb

## 2012-06-17 DIAGNOSIS — M7712 Lateral epicondylitis, left elbow: Secondary | ICD-10-CM

## 2012-06-17 DIAGNOSIS — M7710 Lateral epicondylitis, unspecified elbow: Secondary | ICD-10-CM

## 2012-06-17 DIAGNOSIS — H6990 Unspecified Eustachian tube disorder, unspecified ear: Secondary | ICD-10-CM

## 2012-06-17 DIAGNOSIS — H6992 Unspecified Eustachian tube disorder, left ear: Secondary | ICD-10-CM

## 2012-06-17 MED ORDER — FLUTICASONE PROPIONATE 50 MCG/ACT NA SUSP: 2.0000 | Freq: Every day | NASAL | Status: DC

## 2012-06-17 MED ORDER — COLCHICINE 0.6 MG PO TABS: ORAL_TABLET | ORAL | Status: DC

## 2012-06-17 NOTE — Patient Instructions (Signed)
Today I gave you and injection for your tennis elbow. I would recommend no heavy work status no heavy weight lifting etc. for at least 3 days. When he returned to activity, especially using a chainsaw a lot, use the elbow strap. When you're done with activity for the day ice the area for 20 minutes. I'm also giving him a prescription for some colchicine. We can try that in place of the NSAIDs.  For your hearing loss, I am prescribing and nasal spray. If you are not having some improvement in ear stuffiness and decreased hearing within the next 4-6 weeks and let me know.
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
## 2012-07-22 ENCOUNTER — Other Ambulatory Visit: Payer: Self-pay | Admitting: Dermatology

## 2012-12-10 ENCOUNTER — Other Ambulatory Visit: Payer: Self-pay | Admitting: Family Medicine

## 2012-12-30 ENCOUNTER — Encounter: Payer: Self-pay | Admitting: Family Medicine

## 2012-12-30 ENCOUNTER — Ambulatory Visit (INDEPENDENT_AMBULATORY_CARE_PROVIDER_SITE_OTHER): Payer: Medicare Other | Admitting: Family Medicine

## 2012-12-30 VITALS — BP 156/84 | HR 59 | Temp 97.6°F | Ht 72.0 in | Wt 213.2 lb

## 2012-12-30 DIAGNOSIS — D2921 Benign neoplasm of right testis: Secondary | ICD-10-CM

## 2012-12-30 DIAGNOSIS — I10 Essential (primary) hypertension: Secondary | ICD-10-CM

## 2012-12-30 DIAGNOSIS — E291 Testicular hypofunction: Secondary | ICD-10-CM

## 2012-12-30 DIAGNOSIS — R972 Elevated prostate specific antigen [PSA]: Secondary | ICD-10-CM

## 2012-12-30 DIAGNOSIS — D2920 Benign neoplasm of unspecified testis: Secondary | ICD-10-CM

## 2012-12-30 DIAGNOSIS — Z23 Encounter for immunization: Secondary | ICD-10-CM

## 2012-12-30 LAB — COMPREHENSIVE METABOLIC PANEL
ALT: 22 U/L (ref 0–53)
AST: 26 U/L (ref 0–37)
Albumin: 4.2 g/dL (ref 3.5–5.2)
Alkaline Phosphatase: 70 U/L (ref 39–117)
BUN: 13 mg/dL (ref 6–23)
Calcium: 9.6 mg/dL (ref 8.4–10.5)
Chloride: 101 mEq/L (ref 96–112)
Potassium: 4.5 mEq/L (ref 3.5–5.3)
Sodium: 135 mEq/L (ref 135–145)
Total Protein: 7 g/dL (ref 6.0–8.3)

## 2012-12-30 LAB — CBC WITH DIFFERENTIAL/PLATELET
Basophils Absolute: 0 10*3/uL (ref 0.0–0.1)
Basophils Relative: 0 % (ref 0–1)
Eosinophils Absolute: 0.1 10*3/uL (ref 0.0–0.7)
MCH: 32.3 pg (ref 26.0–34.0)
MCHC: 35.6 g/dL (ref 30.0–36.0)
Monocytes Relative: 13 % — ABNORMAL HIGH (ref 3–12)
Neutro Abs: 3.5 10*3/uL (ref 1.7–7.7)
Neutrophils Relative %: 55 % (ref 43–77)
Platelets: 212 10*3/uL (ref 150–400)
RDW: 14.4 % (ref 11.5–15.5)

## 2012-12-30 NOTE — Patient Instructions (Signed)
Try increasing your blood pressure medicine to 1 1/2 tabs a day. After you have been on it a couple of weeks get three random blood pressure readings and mail them to me. Great to see you! I will send you a note about your blood work.

## 2013-01-01 ENCOUNTER — Encounter: Payer: Self-pay | Admitting: Family Medicine

## 2013-01-01 NOTE — Progress Notes (Signed)
  Subjective:    Patient ID: Mario [name], male    DOB: Jul 29, 1940, 72 y.o.   MRN: [id number]  HPI  Followup hypertension. He's only been taking 100 mg of his beta blocker. Feels like occasionally his blood pressure is up as he has some headache. No visual changes, no shortness of breath, no lower extremity edema and no chest pain. Continues to do powerlifting. Otherwise is doing well.  Review of Systems See history of present illness.    Objective:   Physical Exam  Vital signs reviewed. GENERAL: Well-developed, well-nourished, no acute distress. CARDIOVASCULAR: Regular rate and rhythm no murmur gallop or rub LUNGS: Clear to auscultation bilaterally, no rales or wheeze. ABDOMEN: Soft positive bowel sounds NEURO: No gross focal neurological deficits. MSK: Movement of extremity x 4.        Assessment & Plan:

## 2013-01-01 NOTE — Assessment & Plan Note (Signed)
Will increase him back to 150 mg of atenolol. He'll get some blood pressure readings in male that is in today. We'll see how he does with this. We'll check some blood work today

## 2013-01-05 ENCOUNTER — Encounter: Payer: Self-pay | Admitting: Family Medicine

## 2013-01-21 ENCOUNTER — Other Ambulatory Visit: Payer: Self-pay | Admitting: Dermatology

## 2013-02-26 ENCOUNTER — Ambulatory Visit: Payer: Medicare Other

## 2013-07-22 ENCOUNTER — Other Ambulatory Visit: Payer: Self-pay | Admitting: Dermatology

## 2013-08-07 ENCOUNTER — Emergency Department (HOSPITAL_COMMUNITY)
Admission: EM | Admit: 2013-08-07 | Discharge: 2013-08-07 | Disposition: A | Discharge status: Home / Self Care | Payer: Medicare HMO | Attending: Emergency Medicine | Admitting: Emergency Medicine

## 2013-08-07 ENCOUNTER — Emergency Department (HOSPITAL_COMMUNITY): Payer: Medicare HMO

## 2013-08-07 ENCOUNTER — Encounter (HOSPITAL_COMMUNITY): Payer: Self-pay | Admitting: Emergency Medicine

## 2013-08-07 DIAGNOSIS — K5712 Diverticulitis of small intestine without perforation or abscess without bleeding: Secondary | ICD-10-CM

## 2013-08-07 DIAGNOSIS — M129 Arthropathy, unspecified: Secondary | ICD-10-CM | POA: Insufficient documentation

## 2013-08-07 DIAGNOSIS — R110 Nausea: Secondary | ICD-10-CM | POA: Insufficient documentation

## 2013-08-07 DIAGNOSIS — R509 Fever, unspecified: Secondary | ICD-10-CM | POA: Insufficient documentation

## 2013-08-07 DIAGNOSIS — Z85828 Personal history of other malignant neoplasm of skin: Secondary | ICD-10-CM | POA: Insufficient documentation

## 2013-08-07 DIAGNOSIS — Z87891 Personal history of nicotine dependence: Secondary | ICD-10-CM | POA: Insufficient documentation

## 2013-08-07 DIAGNOSIS — I10 Essential (primary) hypertension: Secondary | ICD-10-CM | POA: Insufficient documentation

## 2013-08-07 DIAGNOSIS — Z79899 Other long term (current) drug therapy: Secondary | ICD-10-CM | POA: Insufficient documentation

## 2013-08-07 DIAGNOSIS — Z87442 Personal history of urinary calculi: Secondary | ICD-10-CM | POA: Insufficient documentation

## 2013-08-07 LAB — URINALYSIS, ROUTINE W REFLEX MICROSCOPIC
Bilirubin Urine: NEGATIVE
Glucose, UA: NEGATIVE mg/dL
Hgb urine dipstick: NEGATIVE
KETONES UR: NEGATIVE mg/dL
LEUKOCYTES UA: NEGATIVE
NITRITE: NEGATIVE
PH: 6 (ref 5.0–8.0)
PROTEIN: NEGATIVE mg/dL
Specific Gravity, Urine: 1.011 (ref 1.005–1.030)
Urobilinogen, UA: 0.2 mg/dL (ref 0.0–1.0)

## 2013-08-07 LAB — CBC WITH DIFFERENTIAL/PLATELET
Basophils Absolute: 0 10*3/uL (ref 0.0–0.1)
Basophils Relative: 0 % (ref 0–1)
Eosinophils Absolute: 0 10*3/uL (ref 0.0–0.7)
Eosinophils Relative: 0 % (ref 0–5)
HCT: 47.8 % (ref 39.0–52.0)
HEMOGLOBIN: 16.7 g/dL (ref 13.0–17.0)
LYMPHS ABS: 1.4 10*3/uL (ref 0.7–4.0)
LYMPHS PCT: 11 % — AB (ref 12–46)
MCH: 33 pg (ref 26.0–34.0)
MCHC: 34.9 g/dL (ref 30.0–36.0)
MCV: 94.5 fL (ref 78.0–100.0)
MONO ABS: 1.6 10*3/uL — AB (ref 0.1–1.0)
MONOS PCT: 12 % (ref 3–12)
NEUTROS ABS: 9.8 10*3/uL — AB (ref 1.7–7.7)
NEUTROS PCT: 77 % (ref 43–77)
Platelets: 171 10*3/uL (ref 150–400)
RBC: 5.06 MIL/uL (ref 4.22–5.81)
RDW: 13.9 % (ref 11.5–15.5)
WBC: 12.8 10*3/uL — ABNORMAL HIGH (ref 4.0–10.5)

## 2013-08-07 LAB — COMPREHENSIVE METABOLIC PANEL
ALK PHOS: 87 U/L (ref 39–117)
ALT: 19 U/L (ref 0–53)
AST: 27 U/L (ref 0–37)
Albumin: 3.8 g/dL (ref 3.5–5.2)
BUN: 12 mg/dL (ref 6–23)
CHLORIDE: 96 meq/L (ref 96–112)
CO2: 24 meq/L (ref 19–32)
CREATININE: 1.15 mg/dL (ref 0.50–1.35)
Calcium: 9.3 mg/dL (ref 8.4–10.5)
GFR calc Af Amer: 72 mL/min — ABNORMAL LOW (ref >90)
GFR, EST NON AFRICAN AMERICAN: 62 mL/min — AB (ref >90)
GLUCOSE: 116 mg/dL — AB (ref 70–99)
POTASSIUM: 4.4 meq/L (ref 3.7–5.3)
Sodium: 135 mEq/L — ABNORMAL LOW (ref 137–147)
Total Bilirubin: 0.9 mg/dL (ref 0.3–1.2)
Total Protein: 7.4 g/dL (ref 6.0–8.3)

## 2013-08-07 LAB — LIPASE, BLOOD: LIPASE: 38 U/L (ref 11–59)

## 2013-08-07 MED ORDER — MORPHINE SULFATE 4 MG/ML IJ SOLN
4.0000 mg | Freq: Once | INTRAMUSCULAR | Status: AC
  Administered 2013-08-07: 4 mg via INTRAVENOUS
  Filled 2013-08-07: qty 1

## 2013-08-07 MED ORDER — METRONIDAZOLE IN NACL 5-0.79 MG/ML-% IV SOLN
500.0000 mg | Freq: Once | INTRAVENOUS | Status: AC
  Administered 2013-08-07: 500 mg via INTRAVENOUS
  Filled 2013-08-07: qty 100

## 2013-08-07 MED ORDER — CIPROFLOXACIN IN D5W 400 MG/200ML IV SOLN
400.0000 mg | Freq: Once | INTRAVENOUS | Status: AC
  Administered 2013-08-07: 400 mg via INTRAVENOUS
  Filled 2013-08-07: qty 200

## 2013-08-07 MED ORDER — HYDROCODONE-ACETAMINOPHEN 5-325 MG PO TABS: 1.0000 | ORAL_TABLET | Freq: Four times a day (QID) | ORAL | Status: DC | PRN

## 2013-08-07 MED ORDER — SODIUM CHLORIDE 0.9 % IV BOLUS (SEPSIS)
1000.0000 mL | Freq: Once | INTRAVENOUS | Status: AC
  Administered 2013-08-07: 1000 mL via INTRAVENOUS

## 2013-08-07 MED ORDER — ONDANSETRON HCL 4 MG/2ML IJ SOLN
4.0000 mg | Freq: Once | INTRAMUSCULAR | Status: AC
  Administered 2013-08-07: 4 mg via INTRAVENOUS
  Filled 2013-08-07: qty 2

## 2013-08-07 MED ORDER — CIPROFLOXACIN HCL 500 MG PO TABS: 500.0000 mg | ORAL_TABLET | Freq: Two times a day (BID) | ORAL | Status: DC

## 2013-08-07 MED ORDER — METRONIDAZOLE 500 MG PO TABS: 500.0000 mg | ORAL_TABLET | Freq: Three times a day (TID) | ORAL | Status: DC

## 2013-08-07 NOTE — ED Notes (Addendum)
Pt presents to department for evaluation of L sided abdominal pain, fever and chills. Onset last night. 10/10 pain upon arrival. Also states nausea. Pt is alert and oriented x4.

## 2013-08-07 NOTE — ED Provider Notes (Addendum)
CSN: [id number]     Arrival date & time 08/07/13  1645 History   First MD Initiated Contact with Patient 08/07/13 [place]     Chief Complaint  Patient presents with  . Abdominal Pain     (Consider location/radiation/quality/duration/timing/severity/associated sxs/prior Treatment) The history is provided by the patient.  Mario [name] is a 73 y.o. male hx of HTN here with L sided ab pain. L sided abdominal pain since yesterday. He thought it was gas and took gas x with no relief. Has history of colitis with similar symptoms. Has nausea and subjective fevers. Denies urinary symptoms. Went to urgent care, sent here because WBC elevated. No previous abdominal surgeries.    Past Medical History  Diagnosis Date  . Hypertension   . Seasonal allergies   . Arthritis     neck  . Melanoma 2000    back; stage 3  . Kidney stones   . Tubular adenoma of colon 2013    colonoscpy in 5 years [name]   Past Surgical History  Procedure Laterality Date  . Cataract extraction  2002, 2004    bilateral  . Melanoma removal  2000    back  . Tendon repair  2012    right  . Elbow surgery  1984    tendon repair left arm   Family History  Problem Relation Age of Onset  . Colon cancer Neg Hx   . Stomach cancer Neg Hx    History  Substance Use Topics  . Smoking status: Former Smoker    Start date: 11/20/1976  . Smokeless tobacco: Never Used  . Alcohol Use: 12.6 oz/week    21 Cans of beer per week    Review of Systems  Constitutional: Positive for fever.  Gastrointestinal: Positive for nausea and abdominal pain.  All other systems reviewed and are negative.     Allergies  Lisinopril  Home Medications   Prior to Admission medications   Medication Sig Start Date End Date Taking? Authorizing Provider  atenolol (TENORMIN) 100 MG tablet Take 100 mg by mouth daily.   Yes Historical Provider, MD  diclofenac (VOLTAREN) 75 MG EC tablet Take 75 mg by mouth daily as needed for mild pain.   Yes  Historical Provider, MD  GLUCOSAMINE-CHONDROITIN PO Take 1 tablet by mouth.   Yes Historical Provider, MD  Multiple Vitamin (MULTIVITAMIN) tablet Take 1 tablet by mouth daily.   Yes Historical Provider, MD  Multiple Vitamins-Minerals (ZINC PO) Take 1 tablet by mouth daily.   Yes Historical Provider, MD  testosterone cypionate (DEPOTESTOTERONE CYPIONATE) 100 MG/ML injection Inject 100 mg into the muscle once a week. Inject on Wednesdays. For IM use only.   Yes Historical Provider, MD   BP 139/54  Pulse 78  Temp(Src) 99.5 F (37.5 C) (Oral)  Resp 17  SpO2 94% Physical Exam  Nursing note and vitals reviewed. Constitutional: He is oriented to person, place, and time.  Uncomfortable   HENT:  Head: Normocephalic.  Mouth/Throat: Oropharynx is clear and moist.  Eyes: Conjunctivae and EOM are normal. Pupils are equal, round, and reactive to light.  Neck: Normal range of motion. Neck supple.  Cardiovascular: Normal rate, regular rhythm and normal heart sounds.   Pulmonary/Chest: Effort normal and breath sounds normal. No respiratory distress. He has no wheezes. He has no rales.  Abdominal:  Distended, tympanic. Mild diffuse tenderness, no rebound. Worse tenderness in LLQ  Musculoskeletal: Normal range of motion. He exhibits no edema and no tenderness.  Neurological: He  is alert and oriented to person, place, and time. No cranial nerve deficit. Coordination normal.  Skin: Skin is warm and dry.  Psychiatric: He has a normal mood and affect. His behavior is normal. Judgment and thought content normal.    ED Course  Procedures (including critical care time) Labs Review Labs Reviewed  CBC WITH DIFFERENTIAL - Abnormal; Notable for the following:    WBC 12.8 (*)    Neutro Abs 9.8 (*)    Lymphocytes Relative 11 (*)    Monocytes Absolute 1.6 (*)    All other components within normal limits  COMPREHENSIVE METABOLIC PANEL - Abnormal; Notable for the following:    Sodium 135 (*)    Glucose, Bld  116 (*)    GFR calc non Af Amer 62 (*)    GFR calc Af Amer 72 (*)    All other components within normal limits  LIPASE, BLOOD  URINALYSIS, ROUTINE W REFLEX MICROSCOPIC    Imaging Review Dg Abd Acute W/chest  08/07/2013   CLINICAL DATA:  Abdominal pain.  EXAM: ACUTE ABDOMEN SERIES (ABDOMEN 2 VIEW & CHEST 1 VIEW)  COMPARISON:  January 19, 2005.  FINDINGS: There is no evidence of dilated bowel loops or free intraperitoneal air. Degenerative changes of lumbar spine are noted. No radiopaque calculi or other significant radiographic abnormality is seen. Heart size and mediastinal contours are within normal limits. Both lungs are clear.  IMPRESSION: No evidence of bowel obstruction or ileus. No acute cardiopulmonary disease.   Electronically Signed   By: Sabino Dick M.D.   On: 08/07/2013 19:59     EKG Interpretation None      MDM   Final diagnoses:  None    Mario [name] is a 73 y.o. male here with ab distention. Will get labs, xrays. Will hydrate and reassess.   11:07 PM Xray nl. WBC 12.8. Mild LLQ tenderness. Had uncomplicated diverticulitis previously. Counseled him regarding another CT vs empiric abx. Wants abx for now. Given cipro, flagyl, will d/c home with same.      Wandra Arthurs, MD 08/07/13 2308  Wandra Arthurs, MD 08/08/13 [phone]

## 2013-08-07 NOTE — Discharge Instructions (Signed)
Take cipro and flagyl for 10 days.   Take vicodin for pain.   Follow up with your doctor.   Return to ER if you have fever, severe pain, vomiting.

## 2013-08-11 ENCOUNTER — Other Ambulatory Visit: Payer: Self-pay | Admitting: Dermatology

## 2013-10-12 ENCOUNTER — Other Ambulatory Visit: Payer: Self-pay | Admitting: Family Medicine

## 2014-01-27 ENCOUNTER — Other Ambulatory Visit: Payer: Self-pay | Admitting: Dermatology

## 2014-09-02 ENCOUNTER — Encounter: Payer: Self-pay | Admitting: Family Medicine

## 2014-09-02 ENCOUNTER — Ambulatory Visit (INDEPENDENT_AMBULATORY_CARE_PROVIDER_SITE_OTHER): Payer: Medicare HMO | Admitting: Family Medicine

## 2014-09-02 VITALS — BP 176/85 | HR 64 | Temp 98.1°F | Ht 72.0 in | Wt 207.0 lb

## 2014-09-02 DIAGNOSIS — R1032 Left lower quadrant pain: Secondary | ICD-10-CM | POA: Diagnosis not present

## 2014-09-02 LAB — CBC WITH DIFFERENTIAL/PLATELET
BASOS ABS: 0 10*3/uL (ref 0.0–0.1)
Basophils Relative: 0 % (ref 0–1)
Eosinophils Absolute: 0.3 10*3/uL (ref 0.0–0.7)
Eosinophils Relative: 3 % (ref 0–5)
HCT: 45.9 % (ref 39.0–52.0)
Hemoglobin: 16 g/dL (ref 13.0–17.0)
Lymphocytes Relative: 16 % (ref 12–46)
Lymphs Abs: 1.7 10*3/uL (ref 0.7–4.0)
MCH: 32.3 pg (ref 26.0–34.0)
MCHC: 34.9 g/dL (ref 30.0–36.0)
MCV: 92.5 fL (ref 78.0–100.0)
MPV: 9.9 fL (ref 8.6–12.4)
Monocytes Absolute: 1.2 10*3/uL — ABNORMAL HIGH (ref 0.1–1.0)
Monocytes Relative: 11 % (ref 3–12)
Neutro Abs: 7.4 10*3/uL (ref 1.7–7.7)
Neutrophils Relative %: 70 % (ref 43–77)
Platelets: 194 10*3/uL (ref 150–400)
RBC: 4.96 MIL/uL (ref 4.22–5.81)
RDW: 13.6 % (ref 11.5–15.5)
WBC: 10.6 10*3/uL — AB (ref 4.0–10.5)

## 2014-09-02 LAB — COMPREHENSIVE METABOLIC PANEL
ALBUMIN: 3.7 g/dL (ref 3.5–5.2)
ALK PHOS: 69 U/L (ref 39–117)
ALT: 11 U/L (ref 0–53)
AST: 18 U/L (ref 0–37)
BUN: 13 mg/dL (ref 6–23)
CO2: 29 mEq/L (ref 19–32)
Calcium: 9.3 mg/dL (ref 8.4–10.5)
Chloride: 99 mEq/L (ref 96–112)
Creat: 1.26 mg/dL (ref 0.50–1.35)
GLUCOSE: 93 mg/dL (ref 70–99)
POTASSIUM: 4.5 meq/L (ref 3.5–5.3)
SODIUM: 137 meq/L (ref 135–145)
TOTAL PROTEIN: 6.5 g/dL (ref 6.0–8.3)
Total Bilirubin: 0.8 mg/dL (ref 0.2–1.2)

## 2014-09-02 LAB — LIPASE: Lipase: 37 U/L (ref 0–75)

## 2014-09-02 MED ORDER — METRONIDAZOLE 500 MG PO TABS: 500.0000 mg | ORAL_TABLET | Freq: Three times a day (TID) | ORAL | Status: DC

## 2014-09-02 MED ORDER — CIPROFLOXACIN HCL 500 MG PO TABS: 500.0000 mg | ORAL_TABLET | Freq: Two times a day (BID) | ORAL | Status: DC

## 2014-09-02 NOTE — Patient Instructions (Signed)
We are going to treat you for diverticulitis Take ciprofloxacin and metronidazole - sent in to your pharmacy Checking some labwork today as well  Return Monday for repeat evaluation If you get worse at all over the weekend (fevers, worsening pain, vomiting, blood in stool) please go to the ER immediately  Be well, Dr. Ardelia Mems

## 2014-09-02 NOTE — Progress Notes (Signed)
Patient ID: Mario [name], male   DOB: September 26, 1940, 74 y.o.   MRN: [id number]  HPI:  Pt presents for a same day appointment to discuss abdominal pain.  Patient reports a history of diverticulitis. Typically has flares once a year, however has had several flares in the last year. He reports that yesterday morning he began having pain in his abdomen on the front and sides, and also in the back. The pain was quite significant, and spontaneously seemed to get a little bit better throughout the day. Has had a lot of bloating and gas. He took his wife's lansoprazole which did help some. Has also tried Gas-X. Hte felt subjectively febrile last night with lots of sweats. Did not actually check his temperature. The pain is worse with movement. Denies any history of abdominal surgeries in the past. No history of hernias. He felt nauseated but not vomited. He's had diarrhea twice without blood. Thinks abdominal pain is improved from yesterday versus today. He is now tolerating intake of food and liquids normally. The pain is primarily in his left lower quadrant, also on the right side. No dysuria. No testicular or scrotal pain. This feels similar to his prior episodes of diverticulitis, which improved with taking oral antibiotics.  ROS: See HPI  [place]: hx orchiectomy, diverticulitis, HTN, melanoma, nephrolithiasis, esophageal stricture  PHYSICAL EXAM: BP 176/85 mmHg  Pulse 64  Temp(Src) 98.1 F (36.7 C) (Oral)  Ht 6' (1.829 m)  Wt 207 lb (93.895 kg)  BMI 28.07 kg/m2 Gen: NAD, pleasant, cooperative HEENT: NCAT. MMM without lesions. Face symmetric. Heart: RRR no murmur Lungs: CTAB NWOB Abdomen: normoactive bowel sounds. Soft. Moderate tenderness in LLQ. No rebound tenderness. No masses palpable. No peritoneal signs. Neuro: grossly nonfocal, speech normal  ASSESSMENT/PLAN:  1. Abdominal pain: ddx broad, includes diverticulitis, appendicitis, viral gastroenteritis, colitis, among other etiologies. At this  time most likely dx is diverticulitis, given similar presentations in the past which improved with PO antibiotic therapy. Discussed options for workup with patient, including outpatient CT abdomen and labwork, versus ER visit to expedite workup. Unfortunately today's visit was on a Friday afternoon, and I doubt we would be able to get outpatient CT scheduled before next week. While he does have tenderness, his abdominal exam does not suggest an acute surgical process and he is generally well appearing. After discussion of options with patient, will proceed with labwork today (CBC/diff, CMET, lipase) and treat empirically with cipro & flagyl for diverticulitis. Pt will return on Monday for repeat examination. Discussed ER return precautions extensively with patient, and provided in writing.  FOLLOW UP: F/u in 3 days for abdominal pain.  [place]. Ardelia Mems, [place]

## 2014-09-05 ENCOUNTER — Ambulatory Visit (INDEPENDENT_AMBULATORY_CARE_PROVIDER_SITE_OTHER): Payer: Medicare HMO | Admitting: Family Medicine

## 2014-09-05 ENCOUNTER — Encounter: Payer: Self-pay | Admitting: Family Medicine

## 2014-09-05 VITALS — BP 177/88 | HR 73 | Temp 97.7°F | Ht 73.0 in | Wt 206.9 lb

## 2014-09-05 DIAGNOSIS — I10 Essential (primary) hypertension: Secondary | ICD-10-CM | POA: Diagnosis not present

## 2014-09-05 DIAGNOSIS — K5732 Diverticulitis of large intestine without perforation or abscess without bleeding: Secondary | ICD-10-CM

## 2014-09-05 NOTE — Progress Notes (Signed)
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   Was seen in [place] [facility] on Friday, 7/22, with LLQ pain and fevers consistent with prior diverticulitis flares, started on cipro/flagyl and told to return today. Labs at that time were significant for WBC 10.6 - otherwise normal including lipase. Today he reports he has been tolerating the medications well, has had no fevers, pain is improving. He is having about 3 formed BMs per day, with 2 BMs/day baseline, and lots of gas. He still has some intermittent, mild-moderate, non-radiating, crampy-type generalized/midline abdominal pain that is relieved entirely with passing gas or a BM. He is eating normally, taking protein, about 60g per day in addition to dietary sources, and drinking milk very frequently. He has also been taking his wife's PPI.    - ROS: No fevers, chills, night sweats, HA, changes in appetite/taste, neuropathic pain, nausea, vomiting.  - Non-smoker  Objective: BP 177/88 mmHg  Pulse 73  Temp(Src) 97.7 F (36.5 C) (Oral)  Ht 6\' 1"  (1.854 m)  Wt 206 lb 14.4 oz (93.849 kg)  BMI 27.30 kg/m2 Gen: Robust 74 y.o. male in no distress GI: Normoactive BS; soft, non-tender to deep palpation x 4 quadrants, non-distended, no organomegaly or stool burden. No hernia appreciated  Assessment/Plan: Mario [name] is a 74 y.o. male here for follow up of diverticulitis.  See problem list for plan.

## 2014-09-05 NOTE — Assessment & Plan Note (Signed)
Asymptomatic. Likely white coat HTN given reports of home BP readings at goal. Will not change medications, though DASH diet principles were reviewed and PCP follow up recommended. Could consider ambulatory monitoring.

## 2014-09-05 NOTE — Patient Instructions (Signed)
Keep taking the antibiotics. If anything changes/gets worse let us know at [phone]. You should follow up with Dr. Deatra Ina as these episodes keep happening and there are symptoms that may not be related to diverticulitis.

## 2014-09-05 NOTE — Assessment & Plan Note (Addendum)
Mild, uncomplicated by history/PE, so no imaging required. Effectively treated with po abx, but recurrent episode (3 in past 12 months), endorsing frequent fecal urgency, pain w/relief with defecation. Recommended to continue abx, separating administration from milk drinking, stop taking his wife's PPI, continue hydration ad lib. Urged to follow up here if any changes/worsening during or after abx course. Follow up with gastroenterologist, Dr. Deatra Ina recommended for recurrent diverticulitis and symptoms of IBS.

## 2014-09-07 ENCOUNTER — Encounter: Payer: Self-pay | Admitting: Family Medicine

## 2014-09-20 ENCOUNTER — Encounter: Payer: Self-pay | Admitting: Gastroenterology

## 2014-09-20 ENCOUNTER — Ambulatory Visit (INDEPENDENT_AMBULATORY_CARE_PROVIDER_SITE_OTHER): Payer: Medicare HMO | Admitting: Gastroenterology

## 2014-09-20 VITALS — BP 132/70 | HR 58 | Ht 70.28 in | Wt 206.2 lb

## 2014-09-20 DIAGNOSIS — Z860100 Personal history of colon polyps, unspecified: Secondary | ICD-10-CM | POA: Insufficient documentation

## 2014-09-20 DIAGNOSIS — K222 Esophageal obstruction: Secondary | ICD-10-CM | POA: Diagnosis not present

## 2014-09-20 NOTE — Assessment & Plan Note (Signed)
The patient complains of choking with swallowing and mild dysphagia.  He may have a recurrent stricture.  Recommendations #1 EGD with dilation as indicated

## 2014-09-20 NOTE — Patient Instructions (Addendum)
You have been scheduled for an endoscopy. Please follow written instructions given to you at your visit today. If you use inhalers (even only as needed), please bring them with you on the day of your procedure. Your physician has requested that you go to www.startemmi.com and enter the access code given to you at your visit today. This web site gives a general overview about your procedure. However, you should still follow specific instructions given to you by our office regarding your preparation for the procedure.  Low-Fiber Diet Fiber is found in fruits, vegetables, and whole grains. A low-fiber diet restricts fibrous foods that are not digested in the small intestine. A diet containing about 10-15 grams of fiber per day is considered low fiber. Low-fiber diets may be used to:  Promote healing and rest the bowel during intestinal flare-ups.  Prevent blockage of a partially obstructed or narrowed gastrointestinal tract.  Reduce fecal weight and volume.  Slow the movement of feces. You may be on a low-fiber diet as a transitional diet following surgery, after an injury (trauma), or because of a short (acute) or lifelong (chronic) illness. Your health care provider will determine the length of time you need to stay on this diet.  WHAT DO I NEED TO KNOW ABOUT A LOW-FIBER DIET? Always check the fiber content on the packaging's Nutrition Facts label, especially on foods from the grains list. Ask your dietitian if you have questions about specific foods that are related to your condition, especially if the food is not listed below. In general, a low-fiber food will have less than 2 g of fiber. WHAT FOODS CAN I EAT? Grains All breads and crackers made with white flour. Sweet rolls, doughnuts, waffles, pancakes, [place] toast, bagels. Pretzels, Melba toast, zwieback. Well-cooked cereals, such as cornmeal, farina, or cream cereals. Dry cereals that do not contain whole grains, fruit, or nuts, such as  refined corn, wheat, rice, and oat cereals. Potatoes prepared any way without skins, plain pastas and noodles, refined white rice. Use white flour for baking and making sauces. Use allowed list of grains for casseroles, dumplings, and puddings.  Vegetables Strained tomato and vegetable juices. Fresh lettuce, cucumber, spinach. Well-cooked (no skin or pulp) or canned vegetables, such as asparagus, bean sprouts, beets, carrots, green beans, mushrooms, potatoes, pumpkin, spinach, yellow squash, tomato sauce/puree, turnips, yams, and zucchini. Keep servings limited to  cup.  Fruits All fruit juices except prune juice. Cooked or canned fruits without skin and seeds, such as applesauce, apricots, cherries, fruit cocktail, grapefruit, grapes, mandarin oranges, melons, peaches, pears, pineapple, and plums. Fresh fruits without skin, such as apricots, avocados, bananas, melons, pineapple, nectarines, and peaches. Keep servings limited to  cup or 1 piece.  Meat and Other Protein Sources Ground or well-cooked tender beef, ham, veal, lamb, pork, or poultry. Eggs, plain cheese. Fish, oysters, shrimp, lobster, and other seafood. Liver, organ meats. Smooth nut butters. Dairy All milk products and alternative dairy substitutes, such as soy, rice, almond, and coconut, not containing added whole nuts, seeds, or added fruit. Beverages Decaf coffee, fruit, and vegetable juices or smoothies (small amounts, with no pulp or skins, and with fruits from allowed list), sports drinks, herbal tea. Condiments Ketchup, mustard, vinegar, cream sauce, cheese sauce, cocoa powder. Spices in moderation, such as allspice, basil, bay leaves, celery powder or leaves, cinnamon, cumin powder, curry powder, ginger, mace, marjoram, onion or garlic powder, oregano, paprika, parsley flakes, ground pepper, rosemary, sage, savory, tarragon, thyme, and turmeric. Sweets and Desserts Plain  cakes and cookies, pie made with allowed fruit, pudding,  custard, cream pie. Gelatin, fruit, ice, sherbet, frozen ice pops. Ice cream, ice milk without nuts. Plain hard candy, honey, jelly, molasses, syrup, sugar, chocolate syrup, gumdrops, marshmallows. Limit overall sugar intake.  Fats and Oil Margarine, butter, cream, mayonnaise, salad oils, plain salad dressings made from allowed foods. Choose healthy fats such as olive oil, canola oil, and omega-3 fatty acids (such as found in salmon or tuna) when possible.  Other Bouillon, broth, or cream soups made from allowed foods. Any strained soup. Casseroles or mixed dishes made with allowed foods. The items listed above may not be a complete list of recommended foods or beverages. Contact your dietitian for more options.  WHAT FOODS ARE NOT RECOMMENDED? Grains All whole wheat and whole grain breads and crackers. Multigrains, rye, bran seeds, nuts, or coconut. Cereals containing whole grains, multigrains, bran, coconut, nuts, raisins. Cooked or dry oatmeal, steel-cut oats. Coarse wheat cereals, granola. Cereals advertised as high fiber. Potato skins. Whole grain pasta, wild or brown rice. Popcorn. Coconut flour. Bran, buckwheat, corn bread, multigrains, rye, wheat germ.  Vegetables Fresh, cooked or canned vegetables, such as artichokes, asparagus, beet greens, broccoli, Brussels sprouts, cabbage, celery, cauliflower, corn, eggplant, kale, legumes or beans, okra, peas, and tomatoes. Avoid large servings of any vegetables, especially raw vegetables.  Fruits Fresh fruits, such as apples with or without skin, berries, cherries, figs, grapes, grapefruit, guavas, kiwis, mangoes, oranges, papayas, pears, persimmons, pineapple, and pomegranate. Prune juice and juices with pulp, stewed or dried prunes. Dried fruits, dates, raisins. Fruit seeds or skins. Avoid large servings of all fresh fruits. Meats and Other Protein Sources Tough, fibrous meats with gristle. Chunky nut butter. Cheese made with seeds, nuts, or other  foods not recommended. Nuts, seeds, legumes (beans, including baked beans), dried peas, beans, lentils.  Dairy Yogurt or cheese that contains nuts, seeds, or added fruit.  Beverages Fruit juices with high pulp, prune juice. Caffeinated coffee and teas.  Condiments Coconut, maple syrup, pickles, olives. Sweets and Desserts Desserts, cookies, or candies that contain nuts or coconut, chunky peanut butter, dried fruits. Jams, preserves with seeds, marmalade. Large amounts of sugar and sweets. Any other dessert made with fruits from the not recommended list.  Other Soups made from vegetables that are not recommended or that contain other foods not recommended.  The items listed above may not be a complete list of foods and beverages to avoid. Contact your dietitian for more information. Document Released: 07/20/2001 Document Revised: 02/02/2013 Document Reviewed: 12/21/2012 [facility] Patient Information 2015 [place], [place]. This information is not intended to replace advice given to you by your health care provider. Make sure you discuss any questions you have with your health care provider.

## 2014-09-20 NOTE — Assessment & Plan Note (Signed)
I history patient has had at least 2 and possibly 3 episodes of acute diverticulitis in the last 3 months.  Currently he is asymptomatic.  He was instructed to start a low fiber diet in the event that he develops lower abdominal pain and did contact this office so that we may obtain a CT scan during an acute episode.  CC Dr. Nori Riis

## 2014-09-20 NOTE — Progress Notes (Signed)
_                                                                                                                History of Present Illness:  Mario [name] is a pleasant 74 year old white male referred at the request of Dr. Milta Deiters for evaluation of abdominal pain.  In the past 6 months he's had 3 discrete episodes of severe lower abdominal pain clinically diagnosis diverticulitis.  He received antibiotics on 2 separate occasions.  An episode of pain dissipated within a couple of days without antibiotics.  Between episodes he has felt well.  There is been no change in bowel habits.  Last colonoscopy in 2013 demonstrated diverticulosis.  A small adenomatous polyp was removed.  Patient also is complaining of choking when he swallows.  He may have mild dysphagia to solids.  He denies pyrosis.  He has a history of an esophageal stricture.   Past Medical History  Diagnosis Date  . Hypertension   . Seasonal allergies   . Arthritis     neck  . Melanoma 2000    back; stage 3  . Kidney stones   . Tubular adenoma of colon 2013    colonoscpy in 5 years [name]   Past Surgical History  Procedure Laterality Date  . Cataract extraction  2002, 2004    bilateral  . Melanoma removal  2000    back  . Tendon repair  2012    right  . Elbow surgery  1984    tendon repair left arm  . Tonsillectomy     family history includes Heart disease in his mother. There is no history of Colon cancer or Stomach cancer. Current Outpatient Prescriptions  Medication Sig Dispense Refill  . atenolol (TENORMIN) 100 MG tablet Take 100 mg by mouth daily.    . Multiple Vitamin (MULTIVITAMIN) tablet Take 1 tablet by mouth daily.    [name] testosterone cypionate (DEPOTESTOTERONE CYPIONATE) 100 MG/ML injection Inject 100 mg into the muscle once a week. Inject on Wednesdays. For IM use only.     No current facility-administered medications for this visit.   Allergies as of 09/20/2014 - Review Complete 09/20/2014    Allergen Reaction Noted  . Lisinopril  07/12/2008    reports that he has quit smoking. He started smoking about 37 years ago. He has never used smokeless tobacco. He reports that he drinks about 12.6 oz of alcohol per week. He reports that he does not use illicit drugs.   Review of Systems: He complains of PVCs Pertinent positive and negative review of systems were noted in the above HPI section. All other review of systems were otherwise negative.  Vital signs were reviewed in today's medical record Physical Exam: General: Well developed , well nourished, no acute distress Skin: anicteric Head: Normocephalic and atraumatic Eyes:  sclerae anicteric, EOMI Ears: Normal auditory acuity Mouth: No deformity or lesions Neck: Supple, no masses or thyromegaly Lymph Nodes: no lymphadenopathy Lungs: Clear throughout to auscultation Heart: Regular rate  and rhythm; no murmurs, rubs or bruits Gastroinestinal: Soft, non tender and non distended. No masses, hepatosplenomegaly or hernias noted. Normal Bowel sounds Rectal:deferred Musculoskeletal: Symmetrical with no gross deformities  Skin: No lesions on visible extremities Pulses:  Normal pulses noted Extremities: No clubbing, cyanosis, edema or deformities noted Neurological: Alert oriented x 4, grossly nonfocal Cervical Nodes:  No significant cervical adenopathy Inguinal Nodes: No significant inguinal adenopathy Psychological:  Alert and cooperative. Normal mood and affect  See Assessment and Plan under Problem List

## 2014-09-20 NOTE — Assessment & Plan Note (Signed)
Followup colonoscopy 2018 

## 2014-09-21 ENCOUNTER — Ambulatory Visit (AMBULATORY_SURGERY_CENTER): Payer: Medicare HMO | Admitting: Gastroenterology

## 2014-09-21 ENCOUNTER — Encounter: Payer: Self-pay | Admitting: Gastroenterology

## 2014-09-21 VITALS — BP 128/82 | HR 68 | Temp 97.3°F | Resp 13 | Ht 70.0 in | Wt 206.0 lb

## 2014-09-21 DIAGNOSIS — K222 Esophageal obstruction: Secondary | ICD-10-CM

## 2014-09-21 DIAGNOSIS — R1310 Dysphagia, unspecified: Secondary | ICD-10-CM | POA: Diagnosis present

## 2014-09-21 MED ORDER — SODIUM CHLORIDE 0.9 % IV SOLN: 500.0000 mL | INTRAVENOUS | Status: DC

## 2014-09-21 NOTE — Op Note (Addendum)
[facility]  [address] [place], [address]   ENDOSCOPY PROCEDURE REPORT  PATIENT: Mario, [name]  MR#: [id number] BIRTHDATE: [birth year] , 73  yrs. old GENDER: male ENDOSCOPIST: Inda Castle, MD REFERRED BY:  Dorcas Mcmurray, M.D. PROCEDURE DATE:  09/21/2014 PROCEDURE:  EGD, diagnostic ASA CLASS:     Class II INDICATIONS:  dysphagia. MEDICATIONS: Monitored anesthesia care, Propofol 150 mg IV, and Lidocaine 40 mg IV TOPICAL ANESTHETIC:  DESCRIPTION OF PROCEDURE: After the risks benefits and alternatives of the procedure were thoroughly explained, informed consent was obtained.  The LB [id number] endoscope was introduced through the mouth and advanced to the second portion of the duodenum , Without limitations.  The instrument was slowly withdrawn as the mucosa was fully examined.    In the larynx there appeared to be a vascular nodule in an area adjacent to the vocal cords but not involving the vocal cords. Except for the findings listed, the EGD was otherwise normal. Retroflexed views revealed no abnormalities.     The scope was then withdrawn from the patient and the procedure completed.  COMPLICATIONS: There were no immediate complications.  ENDOSCOPIC IMPRESSION: 1.   In the larynx there appeared to be a vascular nodule in an area adjacent to the vocal cords but not involving the vocal cords 2.   EGD was otherwise normal  RECOMMENDATIONS: ENT evaluation  REPEAT EXAM:  eSigned:  Inda Castle, MD 09/21/2014 11:39 AM Revised: 09/21/2014 11:39 AM   CC: Melissa Montane, MD

## 2014-09-21 NOTE — Patient Instructions (Signed)
YOU HAD AN ENDOSCOPIC PROCEDURE TODAY AT [facility]:   Refer to the procedure report that was given to you for any specific questions about what was found during the examination.  If the procedure report does not answer your questions, please call your gastroenterologist to clarify.  If you requested that your care partner not be given the details of your procedure findings, then the procedure report has been included in a sealed envelope for you to review at your convenience later.  YOU SHOULD EXPECT: Some feelings of bloating in the abdomen. Passage of more gas than usual.  Walking can help get rid of the air that was put into your GI tract during the procedure and reduce the bloating. If you had a lower endoscopy (such as a colonoscopy or flexible sigmoidoscopy) you may notice spotting of blood in your stool or on the toilet paper. If you underwent a bowel prep for your procedure, you may not have a normal bowel movement for a few days.  Please Note:  You might notice some irritation and congestion in your nose or some drainage.  This is from the oxygen used during your procedure.  There is no need for concern and it should clear up in a day or so.  SYMPTOMS TO REPORT IMMEDIATELY:     Following upper endoscopy (EGD)  Vomiting of blood or coffee ground material  New chest pain or pain under the shoulder blades  Painful or persistently difficult swallowing  New shortness of breath  Fever of 100F or higher  Black, tarry-looking stools  For urgent or emergent issues, a gastroenterologist can be reached at any hour by calling [phone].   DIET: Your first meal following the procedure should be a small meal and then it is ok to progress to your normal diet. Heavy or fried foods are harder to digest and may make you feel nauseous or bloated.  Likewise, meals heavy in dairy and vegetables can increase bloating.  Drink plenty of fluids but you should avoid alcoholic beverages  for 24 hours.  ACTIVITY:  You should plan to take it easy for the rest of today and you should NOT DRIVE or use heavy machinery until tomorrow (because of the sedation medicines used during the test).    FOLLOW UP: Our staff will call the number listed on your records the next business day following your procedure to check on you and address any questions or concerns that you may have regarding the information given to you following your procedure. If we do not reach you, we will leave a message.  However, if you are feeling well and you are not experiencing any problems, there is no need to return our call.  We will assume that you have returned to your regular daily activities without incident.  If any biopsies were taken you will be contacted by phone or by letter within the next 1-3 weeks.  Please call us at [phone] if you have not heard about the biopsies in 3 weeks.    SIGNATURES/CONFIDENTIALITY: You and/or your care partner have signed paperwork which will be entered into your electronic medical record.  These signatures attest to the fact that that the information above on your After Visit Summary has been reviewed and is understood.  Full responsibility of the confidentiality of this discharge information lies with you and/or your care-partner.   Need evaluation by ENT Dr. Dr. Kelby Fam office will set this up for you .  Need  to be evaluated by your cardiologist for Irregular heart rhythm noted today in procedure room and recovery area

## 2014-09-21 NOTE — Progress Notes (Signed)
Noted NSR with PVC's and PAC's when connected to EKG monitor. [name] aware End of procedure- pt coughing constantly - suctioned increased Sa02 dropped to 89 % - 92 %  to 100 % awake encouraged swallowing  To RR HOB elevated SaO2 increased to 98% awake responsive

## 2014-09-21 NOTE — Progress Notes (Signed)
Genella Mech notified by Red Christians RN concerning need for ENT evaluation as ordered by Dr. Deatra Ina.Pt also instructed by Dr. Deatra Ina to see his cardiologist regarding irregular heart rhythm.

## 2014-09-23 ENCOUNTER — Telehealth: Payer: Self-pay

## 2014-09-23 NOTE — Telephone Encounter (Signed)
See EGD report from 09/21/14. Mario [name] notified of the appointment with Dr Redmond Baseman at [facility] on 10/06/14 arrive at 9:30 am. Report and copy of insurance card faxed to [facility].

## 2014-09-23 NOTE — Telephone Encounter (Signed)
  Follow up Call-  Call back number 09/21/2014  Post procedure Call Back phone  # [phone]  Permission to leave phone message Yes     Patient questions:  Do you have a fever, pain , or abdominal swelling? No. Pain Score  0 *  Have you tolerated food without any problems? Yes.    Have you been able to return to your normal activities? Yes.    Do you have any questions about your discharge instructions: Diet   No. Medications  No. Follow up visit  No.  Do you have questions or concerns about your Care? No.  Actions: * If pain score is 4 or above: No action needed, pain <4.

## 2014-09-27 ENCOUNTER — Ambulatory Visit (INDEPENDENT_AMBULATORY_CARE_PROVIDER_SITE_OTHER): Payer: Medicare HMO | Admitting: Cardiovascular Disease

## 2014-09-27 ENCOUNTER — Encounter: Payer: Self-pay | Admitting: Gastroenterology

## 2014-09-27 VITALS — BP 178/100 | HR 59 | Ht 72.0 in | Wt 210.7 lb

## 2014-09-27 DIAGNOSIS — I481 Persistent atrial fibrillation: Secondary | ICD-10-CM

## 2014-09-27 DIAGNOSIS — G4720 Circadian rhythm sleep disorder, unspecified type: Secondary | ICD-10-CM

## 2014-09-27 DIAGNOSIS — I4819 Other persistent atrial fibrillation: Secondary | ICD-10-CM

## 2014-09-27 DIAGNOSIS — G478 Other sleep disorders: Secondary | ICD-10-CM | POA: Diagnosis not present

## 2014-09-27 DIAGNOSIS — I10 Essential (primary) hypertension: Secondary | ICD-10-CM

## 2014-09-27 DIAGNOSIS — Z7189 Other specified counseling: Secondary | ICD-10-CM

## 2014-09-27 DIAGNOSIS — I4891 Unspecified atrial fibrillation: Secondary | ICD-10-CM

## 2014-09-27 DIAGNOSIS — G4730 Sleep apnea, unspecified: Secondary | ICD-10-CM

## 2014-09-27 MED ORDER — AMLODIPINE BESYLATE 5 MG PO TABS: 5.0000 mg | ORAL_TABLET | Freq: Every day | ORAL | Status: DC

## 2014-09-27 MED ORDER — APIXABAN 5 MG PO TABS: 5.0000 mg | ORAL_TABLET | Freq: Two times a day (BID) | ORAL | Status: DC

## 2014-09-27 MED ORDER — ATENOLOL 100 MG PO TABS: ORAL_TABLET | ORAL | Status: DC

## 2014-09-27 NOTE — Patient Instructions (Addendum)
Your physician has requested that you have an echocardiogram. Echocardiography is a painless test that uses sound waves to create images of your heart. It provides your doctor with information about the size and shape of your heart and how well your heart's chambers and valves are working. This procedure takes approximately one hour. There are no restrictions for this procedure.  Your physician has requested that you have en exercise stress myoview. For further information please visit [email or web address]. Please follow instruction sheet, as given.  Your physician has recommended that you have a sleep study. This test records several body functions during sleep, including: brain activity, eye movement, oxygen and carbon dioxide blood levels, heart rate and rhythm, breathing rate and rhythm, the flow of air through your mouth and nose, snoring, body muscle movements, and chest and belly movement.  Your physician recommends that you return for lab work fasting  Your physician has recommended you make the following change in your medication: START NEW PRESCRIPTIONS FOR ELIQUIS 5 MG and AMLODIPINE 5 mg  These has ben sent to your pharmacy. The atenolol has been changed to 100 mg in the morning and 50 mg in the evening. This change has also been sent to your pharmacy.  Your physician recommends that you schedule a follow-up appointment in: 4 weeks with Dr. Claiborne Billings.

## 2014-09-28 LAB — CBC
HEMATOCRIT: 46.8 % (ref 39.0–52.0)
Hemoglobin: 16 g/dL (ref 13.0–17.0)
MCH: 31.7 pg (ref 26.0–34.0)
MCHC: 34.2 g/dL (ref 30.0–36.0)
MCV: 92.7 fL (ref 78.0–100.0)
MPV: 10.4 fL (ref 8.6–12.4)
Platelets: 175 10*3/uL (ref 150–400)
RBC: 5.05 MIL/uL (ref 4.22–5.81)
RDW: 13.8 % (ref 11.5–15.5)
WBC: 7.5 10*3/uL (ref 4.0–10.5)

## 2014-09-28 LAB — COMPREHENSIVE METABOLIC PANEL
ALK PHOS: 77 U/L (ref 40–115)
ALT: 68 U/L — ABNORMAL HIGH (ref 9–46)
AST: 35 U/L (ref 10–35)
Albumin: 4 g/dL (ref 3.6–5.1)
BUN: 13 mg/dL (ref 7–25)
CALCIUM: 9.3 mg/dL (ref 8.6–10.3)
CO2: 26 mmol/L (ref 20–31)
Chloride: 100 mmol/L (ref 98–110)
Creat: 1.16 mg/dL (ref 0.70–1.18)
GLUCOSE: 90 mg/dL (ref 65–99)
POTASSIUM: 4.4 mmol/L (ref 3.5–5.3)
Sodium: 136 mmol/L (ref 135–146)
Total Bilirubin: 1.2 mg/dL (ref 0.2–1.2)
Total Protein: 6.4 g/dL (ref 6.1–8.1)

## 2014-09-28 LAB — MAGNESIUM: Magnesium: 1.7 mg/dL (ref 1.5–2.5)

## 2014-09-28 LAB — TSH: TSH: 3.363 u[IU]/mL (ref 0.350–4.500)

## 2014-09-29 ENCOUNTER — Encounter: Payer: Self-pay | Admitting: Cardiovascular Disease

## 2014-09-29 DIAGNOSIS — G4730 Sleep apnea, unspecified: Secondary | ICD-10-CM | POA: Insufficient documentation

## 2014-09-29 DIAGNOSIS — I4891 Unspecified atrial fibrillation: Secondary | ICD-10-CM | POA: Insufficient documentation

## 2014-09-29 DIAGNOSIS — I10 Essential (primary) hypertension: Secondary | ICD-10-CM | POA: Insufficient documentation

## 2014-09-29 DIAGNOSIS — Z7189 Other specified counseling: Secondary | ICD-10-CM | POA: Insufficient documentation

## 2014-09-29 NOTE — Progress Notes (Signed)
Patient ID: Joshu Furukawa, male   DOB: 22-Jul-1940, 74 y.o.   MRN: [id number]     Primary M.D.: Dr. Mallie Mussel    PATIENT PROFILE: Hilmer Aliberti is a 74 y.o. male who is referred through the courtesy of Dr. Deatra Ina for evaluation of an irregular heart rhythm.  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  Last week, he underwent endoscopy by Dr. Erskine Emery.  He was noted to have an irregular heart rhythm and had normal sinus rhythm with PVCs and PACs when connected to the monitor.  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  The patient states that he had undergone a cardiac catheterization in approximately 1985.  Recently, he has noticed chest tightness when working out and also has noticed significant increasing shortness of breath.  He has been aware of a somewhat irregular rhythm.  Over several months.  In July he was recently treated with diverticular lightness with Cipro and Flagyl and had noticed increased palpitations at that time.  He denies presyncope or syncope.  On further questioning, he snores very loudly and has had significant episodes of witnessed apnea.  He can fall asleep during the day at any time if circumstances permit.  He has a history of hypertension.  Recently he has been taking atenolol 150 mg in the morning.  He also takes testosterone injections once a week.  Past Medical History  Diagnosis Date  . Hypertension   . Seasonal allergies   . Arthritis     neck  . Melanoma 2000    back; stage 3  . Kidney stones   . Tubular adenoma of colon 2013    colonoscpy in 5 years [name]    Past Surgical History  Procedure Laterality Date  . Cataract extraction  2002, 2004    bilateral  . Melanoma removal  2000    back  . Tendon repair  2012    right  . Elbow surgery  1984   tendon repair left arm  . Tonsillectomy      Allergies  Allergen Reactions  . Lisinopril     REACTION: severe GI upset requiring EGD--temporally related to lisinopril--tolerated micardis    Current Outpatient Prescriptions  Medication Sig Dispense Refill  . atenolol (TENORMIN) 100 MG tablet Take 1 tablet in the morning and 1/2 tablet at night 45 tablet 6  . Multiple Vitamin (MULTIVITAMIN) tablet Take 1 tablet by mouth daily.    [name] testosterone cypionate (DEPOTESTOSTERONE CYPIONATE) 200 MG/ML injection Inject 0.5 mg into the muscle once a week.    [name] amLODipine (NORVASC) 5 MG tablet Take 1 tablet (5 mg total) by mouth daily. 180 tablet 3  . apixaban (ELIQUIS) 5 MG TABS tablet Take 1 tablet (5 mg total) by mouth 2 (two) times daily. 60 tablet 6   No current facility-administered medications for this visit.    Social History   Social History  . Marital Status: Married    Spouse Name: N/A  . Number of Children: 2  . Years of Education: N/A   Occupational History  . retired    Social History Main Topics  . Smoking status: Former Smoker    Start date: 11/20/1976  . Smokeless tobacco: Never Used  . Alcohol Use: 12.6 oz/week  21 Cans of beer per week     Comment: occ   . Drug Use: No  . Sexual Activity: Not on file   Other Topics Concern  . Not on file   Social History Narrative   Additional social history is notable in that he is married for 41 years.  He has 2 children and 2 grandchildren.  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  He completed 12th grade of education.  Remotely he had smoked but quit in 1977.  He drinks 3 beers per day.  He has been exercising fairly regularly at least 4 days per week including weightlifting and cardio for proximally 2 hours per session.  Family History  Problem Relation Age of Onset  . Colon cancer Neg Hx   . Stomach cancer Neg Hx   . Heart disease Mother    Family history is notable that his mother died  at age 69 with heart disease and had atrial fibrillation and congestive heart failure.  Father died at 29 with a brain tumor.  He has 2 sisters and both have hypertension.   ROS General: Negative; No fevers, chills, or night sweats HEENT: Negative; No changes in vision or hearing, sinus congestion, difficulty swallowing Pulmonary: Negative; No cough, wheezing, shortness of breath, hemoptysis Cardiovascular:  See HPI;  GI: Negative; No nausea, vomiting, diarrhea, or abdominal pain GU: Negative; No dysuria, hematuria, or difficulty voiding Musculoskeletal: Negative; no myalgias, joint pain, or weakness Hematologic/Oncologic: Negative; no easy bruising, bleeding Endocrine: Negative; no heat/cold intolerance; no diabetes Neuro: Negative; no changes in balance, headaches Skin: Negative; No rashes or skin lesions Psychiatric: Negative; No behavioral problems, depression Sleep: Positive for loud snoring, frequent awakenings, nonrestorative sleep, and daytime sleepiness, hypersomnolence,; no bruxism, restless legs, hypnogagnic hallucinations Other comprehensive 14 point system review is negative   Physical Exam BP 178/100 mmHg  Pulse 59  Ht 6' (1.829 m)  Wt 210 lb 11.2 oz (95.573 kg)  BMI 28.57 kg/m2  Wt Readings from Last 3 Encounters:  09/27/14 210 lb 11.2 oz (95.573 kg)  09/21/14 206 lb (93.441 kg)  09/20/14 206 lb 4 oz (93.554 kg)   General: Alert, oriented, no distress.  Skin: normal turgor, no rashes, warm and dry HEENT: Normocephalic, atraumatic. Pupils equal round and reactive to light; sclera anicteric; extraocular muscles intact; Fundi mild arterial narrowing Nose without nasal septal hypertrophy Mouth/Parynx benign; Mallinpatti scale 3 Neck: No JVD, no carotid bruits; normal carotid upstroke Lungs: clear to ausculatation and percussion; no wheezing or rales Chest wall: without tenderness to palpitation Heart: PMI not displaced, irregularly irregular rhythm with a  ventricular rate around 60, s1 s2 normal, 1/6 systolic murmur, no diastolic murmur, no rubs, gallops, thrills, or heaves Abdomen: soft, nontender; no hepatosplenomehaly, BS+; abdominal aorta nontender and not dilated by palpation. Back: no CVA tenderness Pulses 2+ Musculoskeletal: full range of motion, normal strength, no joint deformities Extremities: no clubbing cyanosis or edema, Homan's sign negative  Neurologic: grossly nonfocal; Cranial nerves grossly wnl Psychologic: Normal mood and affect   ECG (independently read by me): Atrial fibrillation with ventricular rate at 59 bpm.  Nonspecific ST-T changes.  LABS:  BMP Latest Ref Rng 09/27/2014 09/02/2014 08/07/2013  Glucose 65 - 99 mg/dL 90 93 116(H)  BUN 7 - 25 mg/dL '13 13 12  ' Creatinine 0.70 - 1.18 mg/dL 1.16 1.26 1.15  Sodium 135 - 146 mmol/L 136 137 135(L)  Potassium 3.5 - 5.3 mmol/L 4.4 4.5 4.4  Chloride 98 -  110 mmol/L 100 99 96  CO2 20 - 31 mmol/L '26 29 24  ' Calcium 8.6 - 10.3 mg/dL 9.3 9.3 9.3     Hepatic Function Latest Ref Rng 09/27/2014 09/02/2014 08/07/2013  Total Protein 6.1 - 8.1 g/dL 6.4 6.5 7.4  Albumin 3.6 - 5.1 g/dL 4.0 3.7 3.8  AST 10 - 35 U/L 35 18 27  ALT 9 - 46 U/L 68(H) 11 19  Alk Phosphatase 40 - 115 U/L 77 69 87  Total Bilirubin 0.2 - 1.2 mg/dL 1.2 0.8 0.9    CBC Latest Ref Rng 09/27/2014 09/02/2014 08/07/2013  WBC 4.0 - 10.5 K/uL 7.5 10.6(H) 12.8(H)  Hemoglobin 13.0 - 17.0 g/dL 16.0 16.0 16.7  Hematocrit 39.0 - 52.0 % 46.8 45.9 47.8  Platelets 150 - 400 K/uL 175 194 171   Lab Results  Component Value Date   MCV 92.7 09/27/2014   MCV 92.5 09/02/2014   MCV 94.5 08/07/2013   Lab Results  Component Value Date   TSH 3.363 09/27/2014   Lab Results  Component Value Date   HGBA1C 4.9 05/06/2006     BNP No results found for: BNP  ProBNP No results found for: PROBNP   Lipid Panel     Component Value Date/Time   CHOL 193 10/04/2009 1928   TRIG 136 10/04/2009 1928   HDL 51 10/04/2009 1928    CHOLHDL 3.8 Ratio 10/04/2009 1928   VLDL 27 10/04/2009 1928   LDLCALC 115* 10/04/2009 1928    RADIOLOGY: No results found.   ASSESSMENT AND PLAN: Mr. Keysean Savino is a 74 year old male who has been active for most of his life and has previously been a gym owner exercising regularly.  For the past several months he has noticed heart rhythm irregularity and apparently on endoscopy last week was noted to have sinus rhythm with PVCs and PACs.  Today is elevated at 178/100 initially and on repeat was 178/96.  I'm electing to add amlodipine 5 mg to his medical regimen and have recommended he change his atenolol and take 100 mg in the morning and 50 mg at night.  I'm scheduling him for an echo Doppler study to assess systolic and diastolic function, chamber dimensions and valvular architecture.  A complete set of laboratory will be checked including chemistry profile, magnesium level, CBC, thyroid studies, and lipid panel.  He is in atrial fibrillation and I am starting him on eliquis 5 mg twice a day.  I am also scheduling him for an exercise Myoview study in light of his chest pain to make certain this is not ischemic in etiology.  I am highly suspect that he has obstructive sleep apnea which may be playing a role in his atrial fibrillation and will schedule him for a sleep study. If he meets criteria this will be done in a split-night protocol.  I will see him in 4 weeks for cardiology reevaluation and further recommendations will be made at that time.   Troy Sine, MD, [facility] 09/29/2014 6:37 PM

## 2014-10-03 ENCOUNTER — Telehealth: Payer: Self-pay | Admitting: *Deleted

## 2014-10-03 DIAGNOSIS — R7989 Other specified abnormal findings of blood chemistry: Secondary | ICD-10-CM

## 2014-10-03 DIAGNOSIS — R945 Abnormal results of liver function studies: Principal | ICD-10-CM

## 2014-10-03 NOTE — Telephone Encounter (Signed)
-----   Message from Troy Sine, MD sent at 10/03/2014  9:21 AM EDT ----- Re check LFT's in 4 weeks; other labs good

## 2014-10-03 NOTE — Telephone Encounter (Signed)
Patient notified of lab results and recommendations. Hepatic function ordered.

## 2014-10-05 ENCOUNTER — Telehealth (HOSPITAL_COMMUNITY): Payer: Self-pay | Admitting: *Deleted

## 2014-10-05 NOTE — Telephone Encounter (Signed)
Left message on voicemail per DPR in reference to upcoming appointment scheduled on 10/07/14 at 0915 with detailed instructions given per Myocardial Perfusion Study Information Sheet for the test. LM to arrive 15 minutes early, and that it is imperative to arrive on time for appointment to keep from having the test rescheduled.Phone number given for call back for any questions. Aunika Kirsten, Ranae Palms

## 2014-10-07 ENCOUNTER — Other Ambulatory Visit: Payer: Self-pay

## 2014-10-07 ENCOUNTER — Ambulatory Visit (HOSPITAL_BASED_OUTPATIENT_CLINIC_OR_DEPARTMENT_OTHER): Payer: Medicare HMO

## 2014-10-07 ENCOUNTER — Ambulatory Visit (HOSPITAL_COMMUNITY): Payer: Medicare HMO | Attending: Cardiovascular Disease

## 2014-10-07 DIAGNOSIS — I4891 Unspecified atrial fibrillation: Secondary | ICD-10-CM

## 2014-10-07 LAB — MYOCARDIAL PERFUSION IMAGING
CHL CUP NUCLEAR SDS: 1
CHL CUP NUCLEAR SSS: 1
CSEPPHR: 77 {beats}/min
LHR: 0.3
LV dias vol: 153 mL
LV sys vol: 63 mL
Rest HR: 65 {beats}/min
SRS: 0
TID: 0.92

## 2014-10-07 MED ORDER — REGADENOSON 0.4 MG/5ML IV SOLN
0.4000 mg | Freq: Once | INTRAVENOUS | Status: AC
  Administered 2014-10-07: 0.4 mg via INTRAVENOUS

## 2014-10-07 MED ORDER — TECHNETIUM TC 99M SESTAMIBI GENERIC - CARDIOLITE
30.4000 | Freq: Once | INTRAVENOUS | Status: AC | PRN
  Administered 2014-10-07: 30 via INTRAVENOUS

## 2014-10-07 MED ORDER — TECHNETIUM TC 99M SESTAMIBI GENERIC - CARDIOLITE
10.7000 | Freq: Once | INTRAVENOUS | Status: AC | PRN
  Administered 2014-10-07: 11 via INTRAVENOUS

## 2014-10-21 ENCOUNTER — Ambulatory Visit (INDEPENDENT_AMBULATORY_CARE_PROVIDER_SITE_OTHER): Payer: Medicare HMO | Admitting: Cardiovascular Disease

## 2014-10-21 ENCOUNTER — Encounter: Payer: Self-pay | Admitting: Cardiovascular Disease

## 2014-10-21 VITALS — BP 170/82 | HR 52 | Ht 70.5 in | Wt 208.0 lb

## 2014-10-21 DIAGNOSIS — Z7189 Other specified counseling: Secondary | ICD-10-CM

## 2014-10-21 DIAGNOSIS — Z79899 Other long term (current) drug therapy: Secondary | ICD-10-CM

## 2014-10-21 DIAGNOSIS — E785 Hyperlipidemia, unspecified: Secondary | ICD-10-CM | POA: Diagnosis not present

## 2014-10-21 DIAGNOSIS — I10 Essential (primary) hypertension: Secondary | ICD-10-CM | POA: Diagnosis not present

## 2014-10-21 DIAGNOSIS — I480 Paroxysmal atrial fibrillation: Secondary | ICD-10-CM

## 2014-10-21 DIAGNOSIS — G4730 Sleep apnea, unspecified: Secondary | ICD-10-CM

## 2014-10-21 LAB — LIPID PANEL
CHOL/HDL RATIO: 3.9 ratio (ref <=5.0)
CHOLESTEROL: 160 mg/dL (ref 125–200)
HDL: 41 mg/dL (ref >=40)
LDL Cholesterol: 95 mg/dL (ref <130)
Triglycerides: 119 mg/dL (ref <150)
VLDL: 24 mg/dL (ref <30)

## 2014-10-21 LAB — COMPREHENSIVE METABOLIC PANEL
ALK PHOS: 81 U/L (ref 40–115)
ALT: 42 U/L (ref 9–46)
AST: 31 U/L (ref 10–35)
Albumin: 4 g/dL (ref 3.6–5.1)
BUN: 13 mg/dL (ref 7–25)
CO2: 27 mmol/L (ref 20–31)
CREATININE: 1.08 mg/dL (ref 0.70–1.18)
Calcium: 9.2 mg/dL (ref 8.6–10.3)
Chloride: 101 mmol/L (ref 98–110)
GLUCOSE: 90 mg/dL (ref 65–99)
POTASSIUM: 4.5 mmol/L (ref 3.5–5.3)
SODIUM: 137 mmol/L (ref 135–146)
TOTAL PROTEIN: 6.8 g/dL (ref 6.1–8.1)
Total Bilirubin: 1.1 mg/dL (ref 0.2–1.2)

## 2014-10-21 MED ORDER — AMLODIPINE BESYLATE 5 MG PO TABS: 7.5000 mg | ORAL_TABLET | Freq: Every day | ORAL | Status: DC

## 2014-10-21 MED ORDER — LOSARTAN POTASSIUM 50 MG PO TABS: 50.0000 mg | ORAL_TABLET | Freq: Every day | ORAL | Status: DC

## 2014-10-21 NOTE — Patient Instructions (Signed)
Your physician recommends that you return for lab work TODAY.  Your physician has recommended you make the following change in your medication: start new prescription for losartan. Increase the amlodipine to 1 & 1/2 tablet daily. New prescriptions has been sent to your pharmacy.  Your physician recommends that you schedule a follow-up appointment in: 2-3 weeks with Dr. Claiborne Billings.

## 2014-10-21 NOTE — Progress Notes (Signed)
Patient ID: Mario [name], male   DOB: 1940-04-05, 74 y.o.   MRN: [id number]     Primary M.D.: Dr. Mallie Mussel    PATIENT PROFILE: Mario [name] is a 74 y.o. male who was referred through the courtesy of Dr. Deatra Ina for evaluation of an irregular heart rhythm.  I saw him for initial evaluation on 09/27/2014.  He presents for follow-up evaluation.  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  Last month he underwent endoscopy by Dr. Erskine Emery and was noted to have an irregular heart rhythm with  sinus rhythm with PVCs and PACs when connected to the monitor.  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  The patient states that he had undergone a cardiac catheterization in approximately 1985.  Recently, he has noticed chest tightness when working out and also has noticed significant increasing shortness of breath.  He has been aware of a somewhat irregular rhythm.  Over several months.  In July he was recently treated with diverticular lightness with Cipro and Flagyl and had noticed increased palpitations at that time.  When I saw for initial evaluation he was in atrial fibrillation which he was completely unaware of.  On further questioning, he omitted that he snores very loudly and has had significant episodes of witnessed apnea.  He can fall asleep during the day at any time if circumstances permit.  He has a history of hypertension.  Recently he had been taking atenolol 150 mg in the morning had taken testosterone injections once a week.  When I saw him, I changed his atenolol to 100 mg in the morning and 50 Milligan grams at night.  He was significantly hypertensive and I added amlodipine 5 mg to his medical regimen.  I scheduled him for a nuclear perfusion study which was done in 10/07/2014.  There were no ECG  changes.  There was a small defect of mild severity in the basal inferior wall.  It was felt most likely this was diaphragmatic attenuation.  Ejection fraction was 59%.  An echo Doppler study revealed an EF of 50-55% and raise the possibility of mild posterior basal hypokinesis.  He had mild AR and mild MR.  He was started on eliquis at his office visit.  He denies bleeding.  He presents for follow-up evaluation.  Past Medical History  Diagnosis Date  . Hypertension   . Seasonal allergies   . Arthritis     neck  . Melanoma 2000    back; stage 3  . Kidney stones   . Tubular adenoma of colon 2013    colonoscpy in 5 years [name]    Past Surgical History  Procedure Laterality Date  . Cataract extraction  2002, 2004    bilateral  . Melanoma removal  2000    back  . Tendon repair  2012    right  . Elbow surgery  1984    tendon repair left arm  . Tonsillectomy      Allergies  Allergen Reactions  . Lisinopril     REACTION: severe GI upset requiring EGD--temporally related to lisinopril--tolerated micardis    Current Outpatient Prescriptions  Medication Sig Dispense Refill  . amLODipine (NORVASC) 5 MG tablet Take 1 tablet (5 mg total) by mouth daily. 180 tablet 3  .  apixaban (ELIQUIS) 5 MG TABS tablet Take 1 tablet (5 mg total) by mouth 2 (two) times daily. 60 tablet 6  . atenolol (TENORMIN) 100 MG tablet Take 1 tablet in the morning and 1/2 tablet at night 45 tablet 6  . Multiple Vitamin (MULTIVITAMIN) tablet Take 1 tablet by mouth daily.    [name] testosterone cypionate (DEPOTESTOSTERONE CYPIONATE) 200 MG/ML injection Inject 0.5 mg into the muscle once a week.     No current facility-administered medications for this visit.    Social History   Social History  . Marital Status: Married    Spouse Name: N/A  . Number of Children: 2  . Years of Education: N/A   Occupational History  . retired    Social History Main Topics  . Smoking status: Former Smoker    Start date:  11/20/1976  . Smokeless tobacco: Never Used  . Alcohol Use: 12.6 oz/week    21 Cans of beer per week     Comment: occ   . Drug Use: No  . Sexual Activity: Not on file   Other Topics Concern  . Not on file   Social History Narrative   Additional social history is notable in that he is married for 41 years.  He has 2 children and 2 grandchildren.  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  He completed 12th grade of education.  Remotely he had smoked but quit in 1977.  He drinks 3 beers per day.  He has been exercising fairly regularly at least 4 days per week including weightlifting and cardio for proximally 2 hours per session.  Family History  Problem Relation Age of Onset  . Colon cancer Neg Hx   . Stomach cancer Neg Hx   . Heart disease Mother    Family history is notable that his mother died at age 4 with heart disease and had atrial fibrillation and congestive heart failure.  Father died at 23 with a brain tumor.  He has 2 sisters and both have hypertension.   ROS General: Negative; No fevers, chills, or night sweats HEENT: Negative; No changes in vision or hearing, sinus congestion, difficulty swallowing Pulmonary: Negative; No cough, wheezing, shortness of breath, hemoptysis Cardiovascular:  See HPI;  GI: Negative; No nausea, vomiting, diarrhea, or abdominal pain GU: Negative; No dysuria, hematuria, or difficulty voiding Musculoskeletal: Negative; no myalgias, joint pain, or weakness Hematologic/Oncologic: Negative; no easy bruising, bleeding Endocrine: Negative; no heat/cold intolerance; no diabetes Neuro: Negative; no changes in balance, headaches Skin: Negative; No rashes or skin lesions Psychiatric: Negative; No behavioral problems, depression Sleep: Positive for loud snoring, frequent awakenings, nonrestorative sleep, and daytime sleepiness, hypersomnolence,; no bruxism, restless legs, hypnogagnic hallucinations Other comprehensive 14  point system review is negative   Physical Exam BP 170/82 mmHg  Pulse 52  Ht 5' 10.5" (1.791 m)  Wt 208 lb (94.348 kg)  BMI 29.41 kg/m2  Wt Readings from Last 3 Encounters:  10/21/14 208 lb (94.348 kg)  10/07/14 210 lb (95.255 kg)  09/27/14 210 lb 11.2 oz (95.573 kg)   General: Alert, oriented, no distress.  Skin: normal turgor, no rashes, warm and dry HEENT: Normocephalic, atraumatic. Pupils equal round and reactive to light; sclera anicteric; extraocular muscles intact; Fundi mild arterial narrowing Nose without nasal septal hypertrophy Mouth/Parynx benign; Mallinpatti scale 3 Neck: No JVD, no carotid bruits; normal carotid upstroke Lungs: clear to ausculatation and percussion; no wheezing or rales Chest wall: without tenderness to palpitation Heart: PMI not  displaced, irregularly irregular rhythm with a ventricular rate around 60, s1 s2 normal, 1/6 systolic murmur, no diastolic murmur, no rubs, gallops, thrills, or heaves Abdomen: soft, nontender; no hepatosplenomehaly, BS+; abdominal aorta nontender and not dilated by palpation. Back: no CVA tenderness Pulses 2+ Musculoskeletal: full range of motion, normal strength, no joint deformities Extremities: no clubbing cyanosis or edema, Homan's sign negative  Neurologic: grossly nonfocal; Cranial nerves grossly wnl Psychologic: Normal mood and affect  ECG (independently read by me):  Atrial fibrillation with ventricular rate at 52 bpm.  No significant ST-T change.  09/27/2014 ECG (independently read by me): Atrial fibrillation with ventricular rate at 59 bpm.  Nonspecific ST-T changes.  LABS:  BMP Latest Ref Rng 09/27/2014 09/02/2014 08/07/2013  Glucose 65 - 99 mg/dL 90 93 116(H)  BUN 7 - 25 mg/dL '13 13 12  ' Creatinine 0.70 - 1.18 mg/dL 1.16 1.26 1.15  Sodium 135 - 146 mmol/L 136 137 135(L)  Potassium 3.5 - 5.3 mmol/L 4.4 4.5 4.4  Chloride 98 - 110 mmol/L 100 99 96  CO2 20 - 31 mmol/L '26 29 24  ' Calcium 8.6 - 10.3 mg/dL 9.3 9.3  9.3     Hepatic Function Latest Ref Rng 09/27/2014 09/02/2014 08/07/2013  Total Protein 6.1 - 8.1 g/dL 6.4 6.5 7.4  Albumin 3.6 - 5.1 g/dL 4.0 3.7 3.8  AST 10 - 35 U/L 35 18 27  ALT 9 - 46 U/L 68(H) 11 19  Alk Phosphatase 40 - 115 U/L 77 69 87  Total Bilirubin 0.2 - 1.2 mg/dL 1.2 0.8 0.9    CBC Latest Ref Rng 09/27/2014 09/02/2014 08/07/2013  WBC 4.0 - 10.5 K/uL 7.5 10.6(H) 12.8(H)  Hemoglobin 13.0 - 17.0 g/dL 16.0 16.0 16.7  Hematocrit 39.0 - 52.0 % 46.8 45.9 47.8  Platelets 150 - 400 K/uL 175 194 171   Lab Results  Component Value Date   MCV 92.7 09/27/2014   MCV 92.5 09/02/2014   MCV 94.5 08/07/2013   Lab Results  Component Value Date   TSH 3.363 09/27/2014   Lab Results  Component Value Date   HGBA1C 4.9 05/06/2006     BNP No results found for: BNP  ProBNP No results found for: PROBNP   Lipid Panel     Component Value Date/Time   CHOL 193 10/04/2009 1928   TRIG 136 10/04/2009 1928   HDL 51 10/04/2009 1928   CHOLHDL 3.8 Ratio 10/04/2009 1928   VLDL 27 10/04/2009 1928   LDLCALC 115* 10/04/2009 1928    RADIOLOGY: No results found.   ASSESSMENT AND PLAN: Mr. Amarien Carne is a 74 year old male who has been active for most of his life and has previously been a gym owner exercising regularly.  He was recently found to be in atrial fibrillation which he was completely unaware of.  His AF, rate is controlled on his current dose of atenolol 100 mg in the morning and 50 g at night.  His blood pressure today continues to be elevated despite the addition of amlodipine 5 mg to his medical regimen.  I am increasing this to 7.5 mg and am also starting him on losartan 50 g daily.  I reviewed his nuclear stress test as well as his echo Doppler study in detail with him.  He is on anticoagulation with eloquence 5 mg twice a day.  He is fasting today.  No recheck chemistry profile and will also check lipid studies.  I reviewed his additional laboratory from last month with him.  He  will return in 2-3 weeks for follow-up evaluation to reassess his blood pressure and AF.  If at that time, he still in atrial fibrillation plans will be made for outpatient cardioversion.  I also scheduled him for a sleep study, which has not yet been done.  He tells me he tentatively scheduled for October 31.  He has had periods where he wakes up gasping for breath with witnessed apnea.  Try to see if we can expedite his sleep study.  Time spent: 25 minutes  Troy Sine, MD, [facility] 10/21/2014 10:40 AM

## 2014-10-31 ENCOUNTER — Encounter: Payer: Self-pay | Admitting: *Deleted

## 2014-11-01 ENCOUNTER — Telehealth: Payer: Self-pay | Admitting: Cardiovascular Disease

## 2014-11-01 ENCOUNTER — Ambulatory Visit
Admission: RE | Admit: 2014-11-01 | Discharge: 2014-11-01 | Disposition: A | Discharge status: Home / Self Care | Payer: Medicare HMO | Source: Ambulatory Visit | Attending: Cardiovascular Disease | Admitting: Cardiovascular Disease

## 2014-11-01 ENCOUNTER — Encounter: Payer: Self-pay | Admitting: Cardiovascular Disease

## 2014-11-01 ENCOUNTER — Ambulatory Visit (HOSPITAL_BASED_OUTPATIENT_CLINIC_OR_DEPARTMENT_OTHER): Payer: Medicare HMO | Attending: Cardiovascular Disease

## 2014-11-01 ENCOUNTER — Ambulatory Visit (INDEPENDENT_AMBULATORY_CARE_PROVIDER_SITE_OTHER): Payer: Medicare HMO | Admitting: Cardiovascular Disease

## 2014-11-01 ENCOUNTER — Telehealth: Payer: Self-pay | Admitting: *Deleted

## 2014-11-01 VITALS — Ht 72.0 in | Wt 210.0 lb

## 2014-11-01 VITALS — BP 148/74 | HR 50 | Ht 72.0 in | Wt 210.1 lb

## 2014-11-01 DIAGNOSIS — Z7901 Long term (current) use of anticoagulants: Secondary | ICD-10-CM

## 2014-11-01 DIAGNOSIS — Z01818 Encounter for other preprocedural examination: Secondary | ICD-10-CM | POA: Diagnosis not present

## 2014-11-01 DIAGNOSIS — D689 Coagulation defect, unspecified: Secondary | ICD-10-CM

## 2014-11-01 DIAGNOSIS — G478 Other sleep disorders: Secondary | ICD-10-CM

## 2014-11-01 DIAGNOSIS — G4730 Sleep apnea, unspecified: Secondary | ICD-10-CM

## 2014-11-01 DIAGNOSIS — G4761 Periodic limb movement disorder: Secondary | ICD-10-CM | POA: Insufficient documentation

## 2014-11-01 DIAGNOSIS — G4733 Obstructive sleep apnea (adult) (pediatric): Secondary | ICD-10-CM | POA: Diagnosis not present

## 2014-11-01 DIAGNOSIS — I480 Paroxysmal atrial fibrillation: Secondary | ICD-10-CM

## 2014-11-01 DIAGNOSIS — I481 Persistent atrial fibrillation: Secondary | ICD-10-CM

## 2014-11-01 DIAGNOSIS — I4819 Other persistent atrial fibrillation: Secondary | ICD-10-CM

## 2014-11-01 DIAGNOSIS — I10 Essential (primary) hypertension: Secondary | ICD-10-CM | POA: Diagnosis not present

## 2014-11-01 DIAGNOSIS — R0683 Snoring: Secondary | ICD-10-CM | POA: Insufficient documentation

## 2014-11-01 DIAGNOSIS — I4891 Unspecified atrial fibrillation: Secondary | ICD-10-CM | POA: Diagnosis not present

## 2014-11-01 DIAGNOSIS — G4720 Circadian rhythm sleep disorder, unspecified type: Secondary | ICD-10-CM

## 2014-11-01 MED ORDER — ATENOLOL 50 MG PO TABS: ORAL_TABLET | ORAL | Status: DC

## 2014-11-01 MED ORDER — LOSARTAN POTASSIUM 100 MG PO TABS: 100.0000 mg | ORAL_TABLET | Freq: Every day | ORAL | Status: DC

## 2014-11-01 MED ORDER — ZOLPIDEM TARTRATE 5 MG PO TABS: 5.0000 mg | ORAL_TABLET | Freq: Once | ORAL | Status: DC

## 2014-11-01 NOTE — Telephone Encounter (Signed)
Called and spoke with Hughes @ [facility]. Provided her with authorization number for sleep study scheduled for tonight.    PA # [id number].

## 2014-11-01 NOTE — Telephone Encounter (Signed)
Patients sleep study that is scheduled for tonight has been denied.  Holland Falling states he does not meet criteria for an in lab study and are suggesting a home study first.  I spoke with the patient and his wife and they state he has an appt with Dr. Claiborne Billings this morning at 11:00 and will talk to [facility] and Dr. Claiborne Billings then

## 2014-11-01 NOTE — Progress Notes (Signed)
Patient ID: Mario [name], male   DOB: 1940-06-18, 74 y.o.   MRN: [id number]     Primary M.D.: Dr. Mallie Mussel   HPI:  Mario [name] is  Mario [name] is a 74 y.o. male who was referred through the courtesy of Dr. Deatra Ina for evaluation of an irregular heart rhythm.  He presents for follow-up evaluation of his atrial fibrillation.  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  He underwent endoscopy by Dr. Erskine Emery and was noted to have an irregular heart rhythm with  sinus rhythm with PVCs and PACs when connected to the monitor.  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  The patient states that he had undergone a cardiac catheterization in approximately 1985.  Recently, he has noticed chest tightness when working out and also has noticed significant increasing shortness of breath.  He has been aware of a somewhat irregular rhythm.  Over several months.  In July he was recently treated with diverticular lightness with Cipro and Flagyl and had noticed increased palpitations at that time.  When I saw for initial evaluation on 09/27/2014 he was in atrial fibrillation which he was completely unaware of.  On further questioning, he omitted that he snores very loudly and has had significant episodes of witnessed apnea.  He can fall asleep during the day at any time if circumstances permit.  He has a history of hypertension.  Recently he had been taking atenolol 150 mg in the morning had taken testosterone injections once a week.  I changed his atenolol to 100 mg in the morning and 50 mg grams at night.  He was significantly hypertensive and I added amlodipine 5 mg to his medical regimen.  I scheduled him for a nuclear perfusion study which was done in 10/07/2014.  There were no ECG changes.  There was a small defect of mild severity  in the basal inferior wall.  It was felt most likely this was diaphragmatic attenuation.  Ejection fraction was 59%.  An echo Doppler study revealed an EF of 50-55% and raise the possibility of mild posterior basal hypokinesis.  He had mild AR and mild MR.  He was started on eliquis at his office visit.  Follow-up evaluation.  Several weeks ago, his blood pressure was still elevated.  I increased his amlodipine to 7.5 mg in the morning and also started him on losartan 50 mg daily.  He was scheduled for sleep study, which had not yet been done.  He presents today for follow-up evaluation.  He tells me that this morning he was contacted by his insurance company and they were going to deny his sleep study, which was scheduled for tonight.  He states he has been waking up gasping for breath at nighttime.  He is unaware of his heart rhythm.  He denies chest pain.  He presents for evaluation.   Past Medical History  Diagnosis Date  . Hypertension   . Seasonal allergies   . Arthritis     neck  . Melanoma 2000    back; stage 3  . Kidney stones   . Tubular adenoma of colon 2013    colonoscpy in 5 years [name]    Past Surgical History  Procedure Laterality Date  . Cataract extraction  2002, 2004  bilateral  . Melanoma removal  2000    back  . Tendon repair  2012    right  . Elbow surgery  1984    tendon repair left arm  . Tonsillectomy      Allergies  Allergen Reactions  . Lisinopril     REACTION: severe GI upset requiring EGD--temporally related to lisinopril--tolerated micardis    Current Outpatient Prescriptions  Medication Sig Dispense Refill  . amLODipine (NORVASC) 5 MG tablet Take 1.5 tablets (7.5 mg total) by mouth daily. 45 tablet 6  . apixaban (ELIQUIS) 5 MG TABS tablet Take 1 tablet (5 mg total) by mouth 2 (two) times daily. 60 tablet 6  . atenolol (TENORMIN) 100 MG tablet Take 1 tablet in the morning and 1/2 tablet at night 45 tablet 6  . losartan (COZAAR) 50 MG  tablet Take 1 tablet (50 mg total) by mouth daily. 30 tablet 6  . Multiple Vitamin (MULTIVITAMIN) tablet Take 1 tablet by mouth daily.    . testosterone cypionate (DEPOTESTOSTERONE CYPIONATE) 200 MG/ML injection Inject 0.5 mg into the muscle once a week.     No current facility-administered medications for this visit.    Social History   Social History  . Marital Status: Married    Spouse Name: N/A  . Number of Children: 2  . Years of Education: N/A   Occupational History  . retired    Social History Main Topics  . Smoking status: Former Smoker    Start date: 11/20/1976  . Smokeless tobacco: Never Used  . Alcohol Use: 12.6 oz/week    21 Cans of beer per week     Comment: occ   . Drug Use: No  . Sexual Activity: Not on file   Other Topics Concern  . Not on file   Social History Narrative   Additional social history is notable in that he is married for 41 years.  He has 2 children and 2 grandchildren.  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  He completed 12th grade of education.  Remotely he had smoked but quit in 1977.  He drinks 3 beers per day.  He has been exercising fairly regularly at least 4 days per week including weightlifting and cardio for proximally 2 hours per session.  Family History  Problem Relation Age of Onset  . Colon cancer Neg Hx   . Stomach cancer Neg Hx   . Heart disease Mother    Family history is notable that his mother died at age 93 with heart disease and had atrial fibrillation and congestive heart failure.  Father died at 65 with a brain tumor.  He has 2 sisters and both have hypertension.   ROS General: Negative; No fevers, chills, or night sweats HEENT: Negative; No changes in vision or hearing, sinus congestion, difficulty swallowing Pulmonary: Negative; No cough, wheezing, shortness of breath, hemoptysis Cardiovascular:  See HPI;  GI: Negative; No nausea, vomiting, diarrhea, or abdominal pain GU: Negative;  No dysuria, hematuria, or difficulty voiding Musculoskeletal: Negative; no myalgias, joint pain, or weakness Hematologic/Oncologic: Negative; no easy bruising, bleeding Endocrine: Negative; no heat/cold intolerance; no diabetes Neuro: Negative; no changes in balance, headaches Skin: Negative; No rashes or skin lesions Psychiatric: Negative; No behavioral problems, depression Sleep: Positive for loud snoring, frequent awakenings, nonrestorative sleep, and daytime sleepiness, hypersomnolence,; no bruxism, restless legs, hypnogagnic hallucinations Other comprehensive 14 point system review is negative   Physical Exam BP 148/74 mmHg  Pulse 50  Ht   6' (1.829 m)  Wt 210 lb 1.6 oz (95.301 kg)  BMI 28.49 kg/m2  Repeat blood pressure by me 150/78  Wt Readings from Last 3 Encounters:  11/01/14 210 lb 1.6 oz (95.301 kg)  10/21/14 208 lb (94.348 kg)  10/07/14 210 lb (95.255 kg)   General: Alert, oriented, no distress.  Skin: normal turgor, no rashes, warm and dry HEENT: Normocephalic, atraumatic. Pupils equal round and reactive to light; sclera anicteric; extraocular muscles intact; Fundi mild arterial narrowing Nose without nasal septal hypertrophy Mouth/Parynx benign; Mallinpatti scale 3 Neck: No JVD, no carotid bruits; normal carotid upstroke Lungs: clear to ausculatation and percussion; no wheezing or rales Chest wall: without tenderness to palpitation Heart: PMI not displaced, irregularly irregular rhythm with a ventricular rate in the 50's, s1 s2 normal, 1/6 systolic murmur, no diastolic murmur, no rubs, gallops, thrills, or heaves Abdomen: soft, nontender; no hepatosplenomehaly, BS+; abdominal aorta nontender and not dilated by palpation. Back: no CVA tenderness Pulses 2+ Musculoskeletal: full range of motion, normal strength, no joint deformities Extremities: no clubbing cyanosis or edema, Homan's sign negative  Neurologic: grossly nonfocal; Cranial nerves grossly wnl Psychologic:  Normal mood and affect  ECG (independently read by me): Atrial fibrillation with a slow ventricular response in the 50s.  QTc interval 364 ms.  ECG (independently read by me):  Atrial fibrillation with ventricular rate at 52 bpm.  No significant ST-T change.  09/27/2014 ECG (independently read by me): Atrial fibrillation with ventricular rate at 59 bpm.  Nonspecific ST-T changes.  LABS:  BMP Latest Ref Rng 10/21/2014 09/27/2014 09/02/2014  Glucose 65 - 99 mg/dL 90 90 93  BUN 7 - 25 mg/dL 13 13 13  Creatinine 0.70 - 1.18 mg/dL 1.08 1.16 1.26  Sodium 135 - 146 mmol/L 137 136 137  Potassium 3.5 - 5.3 mmol/L 4.5 4.4 4.5  Chloride 98 - 110 mmol/L 101 100 99  CO2 20 - 31 mmol/L 27 26 29  Calcium 8.6 - 10.3 mg/dL 9.2 9.3 9.3     Hepatic Function Latest Ref Rng 10/21/2014 09/27/2014 09/02/2014  Total Protein 6.1 - 8.1 g/dL 6.8 6.4 6.5  Albumin 3.6 - 5.1 g/dL 4.0 4.0 3.7  AST 10 - 35 U/L 31 35 18  ALT 9 - 46 U/L 42 68(H) 11  Alk Phosphatase 40 - 115 U/L 81 77 69  Total Bilirubin 0.2 - 1.2 mg/dL 1.1 1.2 0.8    CBC Latest Ref Rng 09/27/2014 09/02/2014 08/07/2013  WBC 4.0 - 10.5 K/uL 7.5 10.6(H) 12.8(H)  Hemoglobin 13.0 - 17.0 g/dL 16.0 16.0 16.7  Hematocrit 39.0 - 52.0 % 46.8 45.9 47.8  Platelets 150 - 400 K/uL 175 194 171   Lab Results  Component Value Date   MCV 92.7 09/27/2014   MCV 92.5 09/02/2014   MCV 94.5 08/07/2013   Lab Results  Component Value Date   TSH 3.363 09/27/2014   Lab Results  Component Value Date   HGBA1C 4.9 05/06/2006     BNP No results found for: BNP  ProBNP No results found for: PROBNP   Lipid Panel     Component Value Date/Time   CHOL 160 10/21/2014 1055   TRIG 119 10/21/2014 1055   HDL 41 10/21/2014 1055   CHOLHDL 3.9 10/21/2014 1055   VLDL 24 10/21/2014 1055   LDLCALC 95 10/21/2014 1055    RADIOLOGY: No results found.   ASSESSMENT AND PLAN: Mario [name] is a 74-year-old male who was recently found to be in atrial fibrillation on    exam last month and at that time was unaware of his rhythm abnormality.  His AF, rate is controlled on his current dose of atenolol 100 mg in the morning and 50 g at night.  His blood pressure today remains elevated, but is improved with the adjustments to his medical regimen.  Several weeks ago.  I am now further titrating his losartan to 100 mg daily.  He is significant only bradycardic, and I will reduce his atenolol to 75 mg in the morning and 50 mg at night.  He continues to be on eloquence for anticoagulation.  He is taking amlodipine 7.5 mg daily in addition to his atenolol and losartan for blood pressure.  We had been able to expedite his sleep study which had been scheduled for October 31 and was to be done tonight.  He received a phone call today from his insurance company stating that they would not cover it.  In house sleep study.  While Mario [name] was in the office, I spent over 25 minutes on the telephone with his  insurance company and directly spoke  with the Market researcher.  As result, I was able to get a sleep study authorized for this evening.  (Authorization number: [id number]).  I will defer his planned cardioversion for several weeks to allow time for his CPAP initiation since the risk of recurrent AF is greater if his sleep apnea is left untreated.   Time spent: 25 minutes  Troy Sine, MD, [facility] 11/01/2014 12:17 PM

## 2014-11-01 NOTE — Patient Instructions (Signed)
Your physician has recommended you make the following change in your medication: the losartan has been increased to 100 mg daily. The atenolol has been changed to 75 mg in the AM and 50 mg at night., new prescriptions were sent to your pharmacy to reflect this change.  Your physician has recommended that you have a Cardioversion (DCCV). Electrical Cardioversion uses a jolt of electricity to your heart either through paddles or wired patches attached to your chest. This is a controlled, usually prescheduled, procedure. Defibrillation is done under light anesthesia in the hospital, and you usually go home the day of the procedure. This is done to get your heart back into a normal rhythm. You are not awake for the procedure. Please see the instruction sheet given to you today. This will be scheduled in 3 weeks with Dr. Claiborne Billings.   Your physician recommends that you return for lab work and chest xray prior to your procedure. The blood work cannot be older than 7 days.

## 2014-11-03 ENCOUNTER — Encounter: Payer: Self-pay | Admitting: Cardiovascular Disease

## 2014-11-03 DIAGNOSIS — Z7901 Long term (current) use of anticoagulants: Secondary | ICD-10-CM | POA: Insufficient documentation

## 2014-11-07 NOTE — Telephone Encounter (Signed)
I spoke with medical director and was able to get  it approved; so sleep study was done

## 2014-11-14 NOTE — Sleep Study (Signed)
NAME: Mario [name] DATE OF BIRTH:  01/25/1941 MEDICAL RECORD NUMBER [id number]  LOCATION: [facility]   DATE OF STUDY: 11/01/2014  Gender: Male D.O.B: January 08, 1941 Age (years): 49 Referring Provider: Shelva Majestic MD, ABSM Height (inches): 72 Interpreting Physician: Shelva Majestic MD, ABSM Weight (lbs): 210 RPSGT: Madelon Lips BMI: 28 MRN: Neck Size: 17.00  CLINICAL INFORMATION Sleep Study Type: NPSG  Indication for sleep study: OSA, Snoring, daytime sleepiness, nonrestorative sleep in this patient with atrial fibrillation.  Epworth Sleepiness Score: 12  SLEEP STUDY TECHNIQUE As per the AASM Manual for the Scoring of Sleep and Associated Events v2.3 (April 2016) with a hypopnea requiring 4% desaturations.  The channels recorded and monitored were frontal, central and occipital EEG, electrooculogram (EOG), submentalis EMG (chin), nasal and oral airflow, thoracic and abdominal wall motion, anterior tibialis EMG, snore microphone, electrocardiogram, and pulse oximetry.  MEDICATIONS  amLODipine (NORVASC) 5 MG tablet 7.5 mg, Daily apixaban (ELIQUIS) 5 MG TABS tablet 5 mg, 2 times daily atenolol (TENORMIN) 50 MG tablet losartan (COZAAR) 100 MG tablet 100 mg, Daily Multiple Vitamin (MULTIVITAMIN) tablet 1 tablet, Daily testosterone cypionate (DEPOTESTOSTERONE CYPIONATE) 200 MG/ML injection 0.5 mg, Weekly  Note: Received from: External Pharmacy Received Sig: (Written 09/27/2014 1411)  zolpidem (AMBIEN) 5 MG tablet   SLEEP ARCHITECTURE The study was initiated at 10:27:47 PM and ended at 4:46:58 AM.  Sleep onset time was 2.0 minutes and the sleep efficiency was 61.8%. The total sleep time was 234.5 minutes.  Wake after sleep onset (WASO): 142.7 minutes  Stage REM latency was 119.0 minutes.  The patient spent 18.34% of the night in stage N1 sleep, 57.78% in stage N2 sleep, 7.89% in stage N3 and 15.99% in REM.  Alpha intrusion was absent.  Supine sleep was  39.23%.  RESPIRATORY PARAMETERS The overall apnea/hypopnea index (AHI) was 12.3 per hour. There were 31 total apneas, including 30 obstructive, 1 central and 0 mixed apneas. There were 17 hypopneas and 15 RERAs.  The AHI during Stage REM sleep was 9.6 per hour.  AHI while supine was 22.8 per hour.  The mean oxygen saturation was 92.90%. The minimum SpO2 during sleep was 81.00%.  Loud snoring was noted during this study.  CARDIAC DATA The 2 lead EKG demonstrated atrial fibrillation. The mean heart rate was 58.87 beats per minute. Other EKG findings include: None.  LEG MOVEMENT DATA The total PLMS were 96 with a resulting PLMS index of 24.56. Associated arousal with leg movement index was 1.0 .  IMPRESSIONS Mild obstructive sleep apnea/hypopnea syndrome with an overall AHI of 12.3/h. events were moderate with supine sleep with an AHI of 22.8 per hour. No significant central sleep apnea occurred during this study (CAI = 0.3/h). Respiratory events with oxygen desaturation to a nadir of 81.00%. Reduced sleep efficiency at 61.8%. Loud snoring. No cardiac abnormalities were noted during this study. Mild periodic limb movements of sleep occurred during the study. No significant associated arousals.  DIAGNOSIS Obstructive Sleep Apnea (327.23 [G47.33 ICD-10]) Periodic limb movement disorder  RECOMMENDATIONS Therapeutic CPAP titration is recommended in this patient with symptomatic sleep apnea and cardiovascular comorbidities including atrial fibrillation to determine optimal pressure required to alleviate sleep disordered breathing. The patient should be counseled to avoid sleeping in the supine position and consider positional therapy. Avoid alcohol, sedatives and other CNS depressants that may worsen sleep apnea and disrupt normal sleep architecture. Sleep hygiene should be reviewed to assess factors that may improve sleep quality. Weight management and regular exercise  should be  initiated or continued if appropriate.  Troy Sine, MD, [place], American Board of Sleep Medicine  ELECTRONICALLY SIGNED ON:  11/14/2014, 5:44 PM [place] PH: [phone]   FX: [phone] [place]

## 2014-11-17 ENCOUNTER — Other Ambulatory Visit: Payer: Self-pay | Admitting: *Deleted

## 2014-11-17 ENCOUNTER — Telehealth: Payer: Self-pay | Admitting: *Deleted

## 2014-11-17 DIAGNOSIS — G4733 Obstructive sleep apnea (adult) (pediatric): Secondary | ICD-10-CM

## 2014-11-17 DIAGNOSIS — I4891 Unspecified atrial fibrillation: Secondary | ICD-10-CM

## 2014-11-17 NOTE — Telephone Encounter (Signed)
-----   Message from Troy Sine, MD sent at 11/14/2014  6:03 PM EDT ----- Mario [name]; set up for CPAP titration ASAP so that we then can do a cardioversion.

## 2014-11-17 NOTE — Progress Notes (Signed)
CPAP TITRATION STUDY ORDERED.

## 2014-11-17 NOTE — Telephone Encounter (Signed)
Left detailed message sleep study results and recommendations. CPAP titration study ordered.

## 2014-11-22 ENCOUNTER — Other Ambulatory Visit: Payer: Self-pay | Admitting: *Deleted

## 2014-11-22 DIAGNOSIS — Z01818 Encounter for other preprocedural examination: Secondary | ICD-10-CM

## 2014-11-22 DIAGNOSIS — I480 Paroxysmal atrial fibrillation: Secondary | ICD-10-CM

## 2014-11-23 ENCOUNTER — Ambulatory Visit (HOSPITAL_BASED_OUTPATIENT_CLINIC_OR_DEPARTMENT_OTHER): Payer: Medicare HMO | Attending: Cardiovascular Disease | Admitting: Radiology

## 2014-11-23 VITALS — Ht 72.0 in | Wt 210.0 lb

## 2014-11-23 DIAGNOSIS — I4891 Unspecified atrial fibrillation: Secondary | ICD-10-CM | POA: Diagnosis not present

## 2014-11-23 DIAGNOSIS — I4819 Other persistent atrial fibrillation: Secondary | ICD-10-CM

## 2014-11-23 DIAGNOSIS — I481 Persistent atrial fibrillation: Secondary | ICD-10-CM

## 2014-11-23 DIAGNOSIS — G4733 Obstructive sleep apnea (adult) (pediatric): Secondary | ICD-10-CM | POA: Diagnosis not present

## 2014-11-23 DIAGNOSIS — G4730 Sleep apnea, unspecified: Secondary | ICD-10-CM | POA: Diagnosis present

## 2014-11-24 DIAGNOSIS — I10 Essential (primary) hypertension: Secondary | ICD-10-CM | POA: Diagnosis not present

## 2014-11-24 DIAGNOSIS — I480 Paroxysmal atrial fibrillation: Secondary | ICD-10-CM | POA: Diagnosis not present

## 2014-11-24 DIAGNOSIS — I4891 Unspecified atrial fibrillation: Secondary | ICD-10-CM | POA: Diagnosis not present

## 2014-11-24 DIAGNOSIS — D689 Coagulation defect, unspecified: Secondary | ICD-10-CM | POA: Diagnosis not present

## 2014-11-24 LAB — APTT: APTT: 40 s — AB (ref 24–37)

## 2014-11-24 LAB — PROTIME-INR
INR: 1.12 (ref <1.50)
PROTHROMBIN TIME: 14.5 s (ref 11.6–15.2)

## 2014-11-25 LAB — BASIC METABOLIC PANEL
BUN: 13 mg/dL (ref 7–25)
CALCIUM: 9.5 mg/dL (ref 8.6–10.3)
CO2: 28 mmol/L (ref 20–31)
Chloride: 98 mmol/L (ref 98–110)
Creat: 1.1 mg/dL (ref 0.70–1.18)
Glucose, Bld: 85 mg/dL (ref 65–99)
POTASSIUM: 4.3 mmol/L (ref 3.5–5.3)
SODIUM: 137 mmol/L (ref 135–146)

## 2014-11-25 LAB — CBC
HEMATOCRIT: 45.1 % (ref 39.0–52.0)
Hemoglobin: 15.9 g/dL (ref 13.0–17.0)
MCH: 32.1 pg (ref 26.0–34.0)
MCHC: 35.3 g/dL (ref 30.0–36.0)
MCV: 91.1 fL (ref 78.0–100.0)
MPV: 9.4 fL (ref 8.6–12.4)
PLATELETS: 169 10*3/uL (ref 150–400)
RBC: 4.95 MIL/uL (ref 4.22–5.81)
RDW: 14.3 % (ref 11.5–15.5)
WBC: 6 10*3/uL (ref 4.0–10.5)

## 2014-11-28 ENCOUNTER — Telehealth: Payer: Self-pay | Admitting: Cardiovascular Disease

## 2014-11-28 ENCOUNTER — Other Ambulatory Visit: Payer: Self-pay | Admitting: Cardiovascular Disease

## 2014-11-28 MED ORDER — AMLODIPINE BESYLATE 5 MG PO TABS: 7.5000 mg | ORAL_TABLET | Freq: Every day | ORAL | Status: DC

## 2014-11-28 NOTE — Sleep Study (Signed)
Patient Name: Duriel, Deery Date: 11/23/2014 Gender: Male D.O.B: 05-11-1940 Age (years): 53 Referring Provider: Shelva Majestic MD, ABSM Height (inches): 73 Interpreting Physician: Shelva Majestic MD, ABSM Weight (lbs): 210 RPSGT: Zadie Rhine BMI: 28 MRN: Neck Size: 16.50  CLINICAL INFORMATION The patient is referred for a CPAP titration to treat sleep apnea. AHI on diagnostic study was 12.3/hr and oxygen desaturated to a nadir of 81%.   Date of NPSG, Split Night or HST: 11/14/2014  SLEEP STUDY TECHNIQUE As per the AASM Manual for the Scoring of Sleep and Associated Events v2.3 (April 2016) with a hypopnea requiring 4% desaturations. The channels recorded and monitored were frontal, central and occipital EEG, electrooculogram (EOG), submentalis EMG (chin), nasal and oral airflow, thoracic and abdominal wall motion, anterior tibialis EMG, snore microphone, electrocardiogram, and pulse oximetry. Continuous positive airway pressure (CPAP) was initiated at the beginning of the study and titrated to treat sleep-disordered breathing.  MEDICATIONS    amLODipine (NORVASC) 5 MG tablet 7.5 mg, Daily     apixaban (ELIQUIS) 5 MG TABS tablet 5 mg, 2 times daily     atenolol (TENORMIN) 50 MG tablet      losartan (COZAAR) 100 MG tablet 100 mg, Daily     Multiple Vitamin (MULTIVITAMIN) tablet 1 tablet, Daily     testosterone cypionate (DEPOTESTOSTERONE CYPIONATE) 200 MG/ML injection 0.5 mg,  Weekly     Note: Received from: External Pharmacy Received Sig: (Written 09/27/2014 1411)   zolpidem (AMBIEN) 5 MG tablet   Medications administered by patient during sleep study : No sleep medicine administered.  TECHNICIAN COMMENTS Comments added by technician: PT DID NOT TAKEN ANY MEDICATION. ONE RESTROOM VISTED  Comments added by scorer: N/A  RESPIRATORY PARAMETERS Optimal PAP Pressure (cm): 8 AHI at Optimal Pressure (/hr): 0.6 Overall Minimal O2 (%): 88.00 Supine % at Optimal Pressure  (%): 34 Minimal O2 at Optimal Pressure (%): 88.0    SLEEP ARCHITECTURE The study was initiated at 10:26:16 PM and ended at 5:26:32 AM. Sleep onset time was 10.0 minutes and the sleep efficiency was 79.1%. The total sleep time was 332.5 minutes. Wake after sleep onset (WASO): 77.8 minutes The patient spent 5.11% of the night in stage N1 sleep, 56.84% in stage N2 sleep, 0.00% in stage N3 and 38.05% in REM.Stage REM latency was 64.0 minutes Wake after sleep onset was 77.8. Alpha intrusion was absent. Supine sleep was 79.70%.  CARDIAC DATA The 2 lead EKG demonstrated atrial fibrillation. The mean heart rate was 47.38 beats per minute. Other EKG findings include: None.  LEG MOVEMENT DATA The total Periodic Limb Movements of Sleep (PLMS) were 241. The PLMS index was 43.49. A PLMS index of <15 is considered normal in adults.  IMPRESSIONS - The optimal CPAP pressure was 8 cm of water. - Central sleep apnea was not noted during this titration (CAI = 0.0/h). - Moderate oxygen desaturations were observed during this titration (min O2 = 88.00%). - Resolution of snoring with CPAP therapy. - Atrial Fibrillation was present throughout the study. - Moderate periodic limb movements were observed during this study. Arousals associated with PLMs were rare.  DIAGNOSIS - Obstructive Sleep Apnea (327.23 [G47.33 ICD-10])  RECOMMENDATIONS - Recommend initial CPAP therapy with EPR/C-Flex of 3 at 8 cm water pressure with heated humidification. - The patient tolerated CPAP well. - Avoid alcohol, sedatives and other CNS depressants that may worsen sleep apnea and disrupt normal sleep architecture. - Sleep hygiene should be reviewed to assess factors that may improve  sleep quality. - Weight management and regular exercise should be initiated or continued.   Troy Sine, MD, [place], American Board of Sleep Medicine  ELECTRONICALLY SIGNED ON:  11/28/2014, 3:22 PM [place] PH: [phone]   FX: [phone] [place]

## 2014-11-28 NOTE — Telephone Encounter (Signed)
Pt wants to know when will he receive his C-Pap machine?

## 2014-11-29 ENCOUNTER — Encounter: Payer: Self-pay | Admitting: *Deleted

## 2014-11-30 NOTE — Telephone Encounter (Signed)
Pt still have not received his C Pap  Machine,please call her today.

## 2014-11-30 NOTE — Telephone Encounter (Signed)
Returned a call to patient's wife informing her that the study was just done and there are additional steps before he gets his machine. The information has to be sent over to the [employer] company then they have to verify benefits first before they set him up on therapy. She had concerns that Dr. Claiborne Billings wanted him on therapy before he does his DCCV which is scheduled for tomorrow. I paged Dr. Claiborne Billings and questioned if he wanted them to keep the cardioversion appointment. He said that it will be okay but give them the choice. The patient's wife was informed and she decided they will keep the cardioversion appointment.

## 2014-12-01 ENCOUNTER — Ambulatory Visit (HOSPITAL_COMMUNITY)
Admission: RE | Admit: 2014-12-01 | Discharge: 2014-12-01 | Disposition: A | Discharge status: Home / Self Care | Payer: Medicare HMO | Source: Ambulatory Visit | Attending: Cardiovascular Disease | Admitting: Cardiovascular Disease

## 2014-12-01 ENCOUNTER — Encounter (HOSPITAL_COMMUNITY): Payer: Self-pay | Admitting: *Deleted

## 2014-12-01 ENCOUNTER — Encounter (HOSPITAL_COMMUNITY)
Admission: RE | Disposition: A | Discharge status: Home / Self Care | Payer: Self-pay | Source: Ambulatory Visit | Attending: Cardiovascular Disease

## 2014-12-01 ENCOUNTER — Telehealth: Payer: Self-pay | Admitting: *Deleted

## 2014-12-01 ENCOUNTER — Ambulatory Visit (HOSPITAL_COMMUNITY): Payer: Medicare HMO | Admitting: Certified Registered"

## 2014-12-01 DIAGNOSIS — I481 Persistent atrial fibrillation: Secondary | ICD-10-CM | POA: Diagnosis not present

## 2014-12-01 DIAGNOSIS — Z888 Allergy status to other drugs, medicaments and biological substances status: Secondary | ICD-10-CM | POA: Insufficient documentation

## 2014-12-01 DIAGNOSIS — Z87442 Personal history of urinary calculi: Secondary | ICD-10-CM | POA: Insufficient documentation

## 2014-12-01 DIAGNOSIS — M1388 Other specified arthritis, other site: Secondary | ICD-10-CM | POA: Diagnosis not present

## 2014-12-01 DIAGNOSIS — I4891 Unspecified atrial fibrillation: Secondary | ICD-10-CM | POA: Insufficient documentation

## 2014-12-01 DIAGNOSIS — I4819 Other persistent atrial fibrillation: Secondary | ICD-10-CM | POA: Insufficient documentation

## 2014-12-01 DIAGNOSIS — G4730 Sleep apnea, unspecified: Secondary | ICD-10-CM | POA: Diagnosis not present

## 2014-12-01 DIAGNOSIS — Z87891 Personal history of nicotine dependence: Secondary | ICD-10-CM | POA: Insufficient documentation

## 2014-12-01 DIAGNOSIS — I10 Essential (primary) hypertension: Secondary | ICD-10-CM | POA: Diagnosis not present

## 2014-12-01 DIAGNOSIS — Z86010 Personal history of colonic polyps: Secondary | ICD-10-CM | POA: Diagnosis not present

## 2014-12-01 DIAGNOSIS — Z85820 Personal history of malignant melanoma of skin: Secondary | ICD-10-CM | POA: Diagnosis not present

## 2014-12-01 DIAGNOSIS — Z7901 Long term (current) use of anticoagulants: Secondary | ICD-10-CM | POA: Diagnosis not present

## 2014-12-01 DIAGNOSIS — I480 Paroxysmal atrial fibrillation: Secondary | ICD-10-CM

## 2014-12-01 DIAGNOSIS — Z01818 Encounter for other preprocedural examination: Secondary | ICD-10-CM

## 2014-12-01 HISTORY — PX: CARDIOVERSION: SHX1299

## 2014-12-01 SURGERY — CARDIOVERSION
Anesthesia: Monitor Anesthesia Care

## 2014-12-01 MED ORDER — LIDOCAINE HCL (CARDIAC) 20 MG/ML IV SOLN
INTRAVENOUS | Status: DC | PRN
  Administered 2014-12-01: 60 mg via INTRAVENOUS

## 2014-12-01 MED ORDER — SODIUM CHLORIDE 0.9 % IV SOLN
INTRAVENOUS | Status: DC
  Administered 2014-12-01: 13:00:00 via INTRAVENOUS

## 2014-12-01 MED ORDER — SODIUM CHLORIDE 0.9 % IV SOLN: 250.0000 mL | INTRAVENOUS | Status: DC

## 2014-12-01 MED ORDER — SODIUM CHLORIDE 0.9 % IJ SOLN: 3.0000 mL | Freq: Two times a day (BID) | INTRAMUSCULAR | Status: DC

## 2014-12-01 MED ORDER — HYDROCORTISONE 1 % EX CREA: 1.0000 "application " | TOPICAL_CREAM | Freq: Three times a day (TID) | CUTANEOUS | Status: DC | PRN

## 2014-12-01 MED ORDER — PROPOFOL 10 MG/ML IV BOLUS
INTRAVENOUS | Status: DC | PRN
  Administered 2014-12-01: 150 mg via INTRAVENOUS

## 2014-12-01 MED ORDER — LACTATED RINGERS IV SOLN
INTRAVENOUS | Status: DC | PRN
  Administered 2014-12-01: 13:00:00 via INTRAVENOUS

## 2014-12-01 MED ORDER — SODIUM CHLORIDE 0.9 % IJ SOLN: 3.0000 mL | INTRAMUSCULAR | Status: DC | PRN

## 2014-12-01 NOTE — Anesthesia Postprocedure Evaluation (Signed)
  Anesthesia Post-op Note  Patient: Mario [name]  Procedure(s) Performed: Procedure(s): CARDIOVERSION (N/A)  Patient Location: Endoscopy Unit  Anesthesia Type:General  Level of Consciousness: awake and sedated  Airway and Oxygen Therapy: Patient connected to nasal cannula oxygen  Post-op Pain: none  Post-op Assessment: Patient's Cardiovascular Status Stable              Post-op Vital Signs: stable  Last Vitals:  Filed Vitals:   12/01/14 1117  BP: 141/76  Pulse: 60  Resp: 15    Complications: No apparent anesthesia complications

## 2014-12-01 NOTE — Progress Notes (Signed)
[facility]

## 2014-12-01 NOTE — Interval H&P Note (Signed)
History and Physical Interval Note:  12/01/2014 1:05 PM  Mario [name]  has presented today for surgery, with the diagnosis of AFIB  The various methods of treatment have been discussed with the patient and family. After consideration of risks, benefits and other options for treatment, the patient has consented to  Procedure(s): CARDIOVERSION (N/A) as a surgical intervention .  The patient's history has been reviewed, patient examined, no change in status, stable for surgery.  I have reviewed the patient's chart and labs.  Questions were answered to the patient's satisfaction.     KELLY,THOMAS A

## 2014-12-01 NOTE — Discharge Instructions (Signed)
Monitored Anesthesia Care °Monitored anesthesia care is an anesthesia service for a medical procedure. Anesthesia is the loss of the ability to feel pain. It is produced by medicines called anesthetics. It may affect a small area of your body (local anesthesia), a large area of your body (regional anesthesia), or your entire body (general anesthesia). The need for monitored anesthesia care depends your procedure, your condition, and the potential need for regional or general anesthesia. It is often provided during procedures where:  °· General anesthesia may be needed if there are complications. This is because you need special care when you are under general anesthesia.   °· You will be under local or regional anesthesia. This is so that you are able to have higher levels of anesthesia if needed.   °· You will receive calming medicines (sedatives). This is especially the case if sedatives are given to put you in a semi-conscious state of relaxation (deep sedation). This is because the amount of sedative needed to produce this state can be hard to predict. Too much of a sedative can produce general anesthesia. °Monitored anesthesia care is performed by one or more health care providers who have special training in all types of anesthesia. You will need to meet with these health care providers before your procedure. During this meeting, they will ask you about your medical history. They will also give you instructions to follow. (For example, you will need to stop eating and drinking before your procedure. You may also need to stop or change medicines you are taking.) During your procedure, your health care providers will stay with you. They will:  °· Watch your condition. This includes watching your blood pressure, breathing, and level of pain.   °· Diagnose and treat problems that occur.   °· Give medicines if they are needed. These may include calming medicines (sedatives) and anesthetics.   °· Make sure you are  comfortable.   °Having monitored anesthesia care does not necessarily mean that you will be under anesthesia. It does mean that your health care providers will be able to manage anesthesia if you need it or if it occurs. It also means that you will be able to have a different type of anesthesia than you are having if you need it. When your procedure is complete, your health care providers will continue to watch your condition. They will make sure any medicines wear off before you are allowed to go home.  °  °This information is not intended to replace advice given to you by your health care provider. Make sure you discuss any questions you have with your health care provider. °  °Document Released: 10/24/2004 Document Revised: 02/18/2014 Document Reviewed: 03/11/2012 °Elsevier Interactive Patient Education ©2016 [employer] °Electrical Cardioversion, Care After °Refer to this sheet in the next few weeks. These instructions provide you with information on caring for yourself after your procedure. Your health care provider may also give you more specific instructions. Your treatment has been planned according to current medical practices, but problems sometimes occur. Call your health care provider if you have any problems or questions after your procedure. °WHAT TO EXPECT AFTER THE PROCEDURE °After your procedure, it is typical to have the following sensations: °· Some redness on the skin where the shocks were delivered. If this is tender, a sunburn lotion or hydrocortisone cream may help. °· Possible return of an abnormal heart rhythm within hours or days after the procedure. °HOME CARE INSTRUCTIONS °· Take medicines only as directed by your health care provider.   Be sure you understand how and when to take your medicine. °· Learn how to feel your pulse and check it often. °· Limit your activity for 48 hours after the procedure or as directed by your health care provider. °· Avoid or minimize caffeine and other  stimulants as directed by your health care provider. °SEEK MEDICAL CARE IF: °· You feel like your heart is beating too fast or your pulse is not regular. °· You have any questions about your medicines. °· You have bleeding that will not stop. °SEEK IMMEDIATE MEDICAL CARE IF: °· You are dizzy or feel faint. °· It is hard to breathe or you feel short of breath. °· There is a change in discomfort in your chest. °· Your speech is slurred or you have trouble moving an arm or leg on one side of your body. °· You get a serious muscle cramp that does not go away. °· Your fingers or toes turn cold or blue. °  °This information is not intended to replace advice given to you by your health care provider. Make sure you discuss any questions you have with your health care provider. °  °Document Released: 11/18/2012 Document Revised: 02/18/2014 Document Reviewed: 11/18/2012 °Elsevier Interactive Patient Education ©2016 [employer] ° °

## 2014-12-01 NOTE — Transfer of Care (Signed)
Immediate Anesthesia Transfer of Care Note  Patient: Mario [name]  Procedure(s) Performed: Procedure(s): CARDIOVERSION (N/A)  Patient Location: Endoscopy Unit  Anesthesia Type:MAC  Level of Consciousness: awake, alert  and sedated  Airway & Oxygen Therapy: Patient connected to nasal cannula oxygen  Post-op Assessment: Post -op Vital signs reviewed and stable  Post vital signs: stable  Last Vitals:  Filed Vitals:   12/01/14 1117  BP: 141/76  Pulse: 60  Resp: 15    Complications: No apparent anesthesia complications

## 2014-12-01 NOTE — H&P (View-Only) (Signed)
Patient ID: Mario [name], male   DOB: 12/03/1940, 74 y.o.   MRN: [id number]     Primary M.D.: Dr. Sarah Neal   HPI:  Mario [name] is  Mario [name] is a 74 y.o. male who was referred through the courtesy of Dr. Kaplan for evaluation of an irregular heart rhythm.  He presents for follow-up evaluation of his atrial fibrillation.  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  He underwent endoscopy by Dr. Robert Kaplan and was noted to have an irregular heart rhythm with  sinus rhythm with PVCs and PACs when connected to the monitor.  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  The patient states that he had undergone a cardiac catheterization in approximately 1985.  Recently, he has noticed chest tightness when working out and also has noticed significant increasing shortness of breath.  He has been aware of a somewhat irregular rhythm.  Over several months.  In July he was recently treated with diverticular lightness with Cipro and Flagyl and had noticed increased palpitations at that time.  When I saw for initial evaluation on 09/27/2014 he was in atrial fibrillation which he was completely unaware of.  On further questioning, he omitted that he snores very loudly and has had significant episodes of witnessed apnea.  He can fall asleep during the day at any time if circumstances permit.  He has a history of hypertension.  Recently he had been taking atenolol 150 mg in the morning had taken testosterone injections once a week.  I changed his atenolol to 100 mg in the morning and 50 mg grams at night.  He was significantly hypertensive and I added amlodipine 5 mg to his medical regimen.  I scheduled him for a nuclear perfusion study which was done in 10/07/2014.  There were no ECG changes.  There was a small defect of mild severity  in the basal inferior wall.  It was felt most likely this was diaphragmatic attenuation.  Ejection fraction was 59%.  An echo Doppler study revealed an EF of 50-55% and raise the possibility of mild posterior basal hypokinesis.  He had mild AR and mild MR.  He was started on eliquis at his office visit.  Follow-up evaluation.  Several weeks ago, his blood pressure was still elevated.  I increased his amlodipine to 7.5 mg in the morning and also started him on losartan 50 mg daily.  He was scheduled for sleep study, which had not yet been done.  He presents today for follow-up evaluation.  He tells me that this morning he was contacted by his insurance company and they were going to deny his sleep study, which was scheduled for tonight.  He states he has been waking up gasping for breath at nighttime.  He is unaware of his heart rhythm.  He denies chest pain.  He presents for evaluation.   Past Medical History  Diagnosis Date  . Hypertension   . Seasonal allergies   . Arthritis     neck  . Melanoma 2000    back; stage 3  . Kidney stones   . Tubular adenoma of colon 2013    colonoscpy in 5 years Dr Kaplan    Past Surgical History  Procedure Laterality Date  . Cataract extraction  2002, 2004      bilateral  . Melanoma removal  2000    back  . Tendon repair  2012    right  . Elbow surgery  1984    tendon repair left arm  . Tonsillectomy      Allergies  Allergen Reactions  . Lisinopril     REACTION: severe GI upset requiring EGD--temporally related to lisinopril--tolerated micardis    Current Outpatient Prescriptions  Medication Sig Dispense Refill  . amLODipine (NORVASC) 5 MG tablet Take 1.5 tablets (7.5 mg total) by mouth daily. 45 tablet 6  . apixaban (ELIQUIS) 5 MG TABS tablet Take 1 tablet (5 mg total) by mouth 2 (two) times daily. 60 tablet 6  . atenolol (TENORMIN) 100 MG tablet Take 1 tablet in the morning and 1/2 tablet at night 45 tablet 6  . losartan (COZAAR) 50 MG  tablet Take 1 tablet (50 mg total) by mouth daily. 30 tablet 6  . Multiple Vitamin (MULTIVITAMIN) tablet Take 1 tablet by mouth daily.    . testosterone cypionate (DEPOTESTOSTERONE CYPIONATE) 200 MG/ML injection Inject 0.5 mg into the muscle once a week.     No current facility-administered medications for this visit.    Social History   Social History  . Marital Status: Married    Spouse Name: N/A  . Number of Children: 2  . Years of Education: N/A   Occupational History  . retired    Social History Main Topics  . Smoking status: Former Smoker    Start date: 11/20/1976  . Smokeless tobacco: Never Used  . Alcohol Use: 12.6 oz/week    21 Cans of beer per week     Comment: occ   . Drug Use: No  . Sexual Activity: Not on file   Other Topics Concern  . Not on file   Social History Narrative   Additional social history is notable in that he is married for 41 years.  He has 2 children and 2 grandchildren.  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  He completed 12th grade of education.  Remotely he had smoked but quit in 1977.  He drinks 3 beers per day.  He has been exercising fairly regularly at least 4 days per week including weightlifting and cardio for proximally 2 hours per session.  Family History  Problem Relation Age of Onset  . Colon cancer Neg Hx   . Stomach cancer Neg Hx   . Heart disease Mother    Family history is notable that his mother died at age 93 with heart disease and had atrial fibrillation and congestive heart failure.  Father died at 65 with a brain tumor.  He has 2 sisters and both have hypertension.   ROS General: Negative; No fevers, chills, or night sweats HEENT: Negative; No changes in vision or hearing, sinus congestion, difficulty swallowing Pulmonary: Negative; No cough, wheezing, shortness of breath, hemoptysis Cardiovascular:  See HPI;  GI: Negative; No nausea, vomiting, diarrhea, or abdominal pain GU: Negative;  No dysuria, hematuria, or difficulty voiding Musculoskeletal: Negative; no myalgias, joint pain, or weakness Hematologic/Oncologic: Negative; no easy bruising, bleeding Endocrine: Negative; no heat/cold intolerance; no diabetes Neuro: Negative; no changes in balance, headaches Skin: Negative; No rashes or skin lesions Psychiatric: Negative; No behavioral problems, depression Sleep: Positive for loud snoring, frequent awakenings, nonrestorative sleep, and daytime sleepiness, hypersomnolence,; no bruxism, restless legs, hypnogagnic hallucinations Other comprehensive 14 point system review is negative   Physical Exam BP 148/74 mmHg  Pulse 50  Ht   6' (1.829 m)  Wt 210 lb 1.6 oz (95.301 kg)  BMI 28.49 kg/m2  Repeat blood pressure by me 150/78  Wt Readings from Last 3 Encounters:  11/01/14 210 lb 1.6 oz (95.301 kg)  10/21/14 208 lb (94.348 kg)  10/07/14 210 lb (95.255 kg)   General: Alert, oriented, no distress.  Skin: normal turgor, no rashes, warm and dry HEENT: Normocephalic, atraumatic. Pupils equal round and reactive to light; sclera anicteric; extraocular muscles intact; Fundi mild arterial narrowing Nose without nasal septal hypertrophy Mouth/Parynx benign; Mallinpatti scale 3 Neck: No JVD, no carotid bruits; normal carotid upstroke Lungs: clear to ausculatation and percussion; no wheezing or rales Chest wall: without tenderness to palpitation Heart: PMI not displaced, irregularly irregular rhythm with a ventricular rate in the 50's, s1 s2 normal, 1/6 systolic murmur, no diastolic murmur, no rubs, gallops, thrills, or heaves Abdomen: soft, nontender; no hepatosplenomehaly, BS+; abdominal aorta nontender and not dilated by palpation. Back: no CVA tenderness Pulses 2+ Musculoskeletal: full range of motion, normal strength, no joint deformities Extremities: no clubbing cyanosis or edema, Homan's sign negative  Neurologic: grossly nonfocal; Cranial nerves grossly wnl Psychologic:  Normal mood and affect  ECG (independently read by me): Atrial fibrillation with a slow ventricular response in the 50s.  QTc interval 364 ms.  ECG (independently read by me):  Atrial fibrillation with ventricular rate at 52 bpm.  No significant ST-T change.  09/27/2014 ECG (independently read by me): Atrial fibrillation with ventricular rate at 59 bpm.  Nonspecific ST-T changes.  LABS:  BMP Latest Ref Rng 10/21/2014 09/27/2014 09/02/2014  Glucose 65 - 99 mg/dL 90 90 93  BUN 7 - 25 mg/dL 13 13 13  Creatinine 0.70 - 1.18 mg/dL 1.08 1.16 1.26  Sodium 135 - 146 mmol/L 137 136 137  Potassium 3.5 - 5.3 mmol/L 4.5 4.4 4.5  Chloride 98 - 110 mmol/L 101 100 99  CO2 20 - 31 mmol/L 27 26 29  Calcium 8.6 - 10.3 mg/dL 9.2 9.3 9.3     Hepatic Function Latest Ref Rng 10/21/2014 09/27/2014 09/02/2014  Total Protein 6.1 - 8.1 g/dL 6.8 6.4 6.5  Albumin 3.6 - 5.1 g/dL 4.0 4.0 3.7  AST 10 - 35 U/L 31 35 18  ALT 9 - 46 U/L 42 68(H) 11  Alk Phosphatase 40 - 115 U/L 81 77 69  Total Bilirubin 0.2 - 1.2 mg/dL 1.1 1.2 0.8    CBC Latest Ref Rng 09/27/2014 09/02/2014 08/07/2013  WBC 4.0 - 10.5 K/uL 7.5 10.6(H) 12.8(H)  Hemoglobin 13.0 - 17.0 g/dL 16.0 16.0 16.7  Hematocrit 39.0 - 52.0 % 46.8 45.9 47.8  Platelets 150 - 400 K/uL 175 194 171   Lab Results  Component Value Date   MCV 92.7 09/27/2014   MCV 92.5 09/02/2014   MCV 94.5 08/07/2013   Lab Results  Component Value Date   TSH 3.363 09/27/2014   Lab Results  Component Value Date   HGBA1C 4.9 05/06/2006     BNP No results found for: BNP  ProBNP No results found for: PROBNP   Lipid Panel     Component Value Date/Time   CHOL 160 10/21/2014 1055   TRIG 119 10/21/2014 1055   HDL 41 10/21/2014 1055   CHOLHDL 3.9 10/21/2014 1055   VLDL 24 10/21/2014 1055   LDLCALC 95 10/21/2014 1055    RADIOLOGY: No results found.   ASSESSMENT AND PLAN: Mario [name] is a 74-year-old male who was recently found to be in atrial fibrillation on    exam last month and at that time was unaware of his rhythm abnormality.  His AF, rate is controlled on his current dose of atenolol 100 mg in the morning and 50 g at night.  His blood pressure today remains elevated, but is improved with the adjustments to his medical regimen.  Several weeks ago.  I am now further titrating his losartan to 100 mg daily.  He is significant only bradycardic, and I will reduce his atenolol to 75 mg in the morning and 50 mg at night.  He continues to be on eloquence for anticoagulation.  He is taking amlodipine 7.5 mg daily in addition to his atenolol and losartan for blood pressure.  We had been able to expedite his sleep study which had been scheduled for October 31 and was to be done tonight.  He received a phone call today from his insurance company stating that they would not cover it.  In house sleep study.  While Mario [name] was in the office, I spent over 25 minutes on the telephone with his  insurance company and directly spoke  with the Market researcher.  As result, I was able to get a sleep study authorized for this evening.  (Authorization number: [id number]).  I will defer his planned cardioversion for several weeks to allow time for his CPAP initiation since the risk of recurrent AF is greater if his sleep apnea is left untreated.   Time spent: 25 minutes  Troy Sine, MD, [facility] 11/01/2014 12:17 PM

## 2014-12-01 NOTE — CV Procedure (Signed)
  CARDIOVERSION NOTE   Procedure: Electrical Cardioversion Indications:  Atrial Fibrillation  Procedure Details:  Consent: Risks of procedure as well as the alternatives and risks of each were explained to the (patient/caregiver).  Consent for procedure obtained.  Time Out: Verified patient identification, verified procedure, site/side was marked, verified correct patient position, special equipment/implants available, medications/allergies/relevent history reviewed, required imaging and test results available.  Performed  Patient placed on cardiac monitor, pulse oximetry, supplemental oxygen as necessary.  Sedation given: Short-acting barbiturates and Lidocaine 60 mg and propofol 150 mg Pacer pads placed anterior and posterior chest.  Cardioverted 3 time(s).  Cardioverted at 200 J.  Evaluation: Findings: Post procedure EKG shows: NSR Complications: None Patient did tolerate procedure well.    Troy Sine, MD, [facility] 12/01/2014 1:13 PM

## 2014-12-01 NOTE — Telephone Encounter (Signed)
Faxed CPAP referral to [facility]. 

## 2014-12-01 NOTE — Anesthesia Preprocedure Evaluation (Addendum)
Anesthesia Evaluation  Patient identified by MRN, date of birth, ID band Patient awake    Reviewed: Allergy & Precautions, NPO status , Patient's Chart, lab work & pertinent test results  Airway Mallampati: I  TM Distance: >3 FB     Dental  (+) Teeth Intact, Dental Advisory Given   Pulmonary sleep apnea , former smoker,    breath sounds clear to auscultation       Cardiovascular hypertension, Pt. on medications and Pt. on home beta blockers  Rhythm:Irregular Rate:Normal     Neuro/Psych    GI/Hepatic negative GI ROS, Neg liver ROS,   Endo/Other  negative endocrine ROS  Renal/GU Renal InsufficiencyRenal disease     Musculoskeletal  (+) Arthritis ,   Abdominal (+)  Abdomen: soft. Bowel sounds: normal.  Peds  Hematology negative hematology ROS (+)   Anesthesia Other Findings   Reproductive/Obstetrics                           Anesthesia Physical Anesthesia Plan  ASA: II  Anesthesia Plan: General   Post-op Pain Management:    Induction: Intravenous  Airway Management Planned: Mask  Additional Equipment:   Intra-op Plan:   Post-operative Plan:   Informed Consent: I have reviewed the patients History and Physical, chart, labs and discussed the procedure including the risks, benefits and alternatives for the proposed anesthesia with the patient or authorized representative who has indicated his/her understanding and acceptance.     Plan Discussed with: CRNA  Anesthesia Plan Comments:         Anesthesia Quick Evaluation

## 2014-12-02 ENCOUNTER — Encounter (HOSPITAL_COMMUNITY): Payer: Self-pay | Admitting: Cardiovascular Disease

## 2014-12-09 ENCOUNTER — Telehealth: Payer: Self-pay | Admitting: Cardiovascular Disease

## 2014-12-09 NOTE — Telephone Encounter (Signed)
Pt called in wanting to speak with Mariann Laster. She says that she has yet to hear anything in regards to the pt's CPAP machine. She wanted to know if would help any for her to contact the people who handle the CPAP equipment. Please f/u  Thanks

## 2014-12-09 NOTE — Telephone Encounter (Signed)
Returned call to patient's wife.She stated it has been 2 weeks and have not heard anything about receiving new cpap machine.Abbie Sons in clinic today will send message to her.

## 2014-12-09 NOTE — Telephone Encounter (Signed)
Returned a call to patient's wife. She informed me that [facility] called them and they have a appointment on Monday morning for CPAP set up.

## 2014-12-12 ENCOUNTER — Ambulatory Visit (HOSPITAL_BASED_OUTPATIENT_CLINIC_OR_DEPARTMENT_OTHER): Payer: Medicare HMO

## 2014-12-12 DIAGNOSIS — G4733 Obstructive sleep apnea (adult) (pediatric): Secondary | ICD-10-CM | POA: Diagnosis not present

## 2015-01-02 ENCOUNTER — Ambulatory Visit: Payer: Medicare HMO | Admitting: Cardiovascular Disease

## 2015-01-11 DIAGNOSIS — G4733 Obstructive sleep apnea (adult) (pediatric): Secondary | ICD-10-CM | POA: Diagnosis not present

## 2015-01-12 ENCOUNTER — Ambulatory Visit (INDEPENDENT_AMBULATORY_CARE_PROVIDER_SITE_OTHER): Payer: Medicare HMO | Admitting: Cardiovascular Disease

## 2015-01-12 ENCOUNTER — Encounter: Payer: Self-pay | Admitting: Cardiovascular Disease

## 2015-01-12 VITALS — BP 140/80 | HR 62 | Ht 72.0 in | Wt 214.0 lb

## 2015-01-12 DIAGNOSIS — Z7901 Long term (current) use of anticoagulants: Secondary | ICD-10-CM

## 2015-01-12 DIAGNOSIS — I480 Paroxysmal atrial fibrillation: Secondary | ICD-10-CM

## 2015-01-12 DIAGNOSIS — I10 Essential (primary) hypertension: Secondary | ICD-10-CM

## 2015-01-12 DIAGNOSIS — G4733 Obstructive sleep apnea (adult) (pediatric): Secondary | ICD-10-CM | POA: Insufficient documentation

## 2015-01-12 DIAGNOSIS — Z9989 Dependence on other enabling machines and devices: Secondary | ICD-10-CM

## 2015-01-12 MED ORDER — ATENOLOL 50 MG PO TABS: ORAL_TABLET | ORAL | Status: DC

## 2015-01-12 NOTE — Patient Instructions (Signed)
Your physician has recommended you make the following change in your medication: increase the atenolol to 1& 1/2 tablets twice a day.  Your physician wants you to follow-up in: 6 months or sooner if needed. You will receive a reminder letter in the mail two months in advance. If you don't receive a letter, please call our office to schedule the follow-up appointment.  If you need a refill on your cardiac medications before your next appointment, please call your pharmacy.

## 2015-01-12 NOTE — Progress Notes (Signed)
Patient ID: Mario [name], male   DOB: October 26, 1940, 74 y.o.   MRN: [id number]     Primary M.D.: Dr. Mallie Mussel   HPI:  Mario [name] is  Mario [name] is a 74 y.o. male  Has a history of PAF, and recent diagnosis of obstructive sleep apnea.  He presents for sleep clinic following initiation of CPAP therapy.  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  He underwent endoscopy by Dr. Erskine Emery and was noted to have an irregular heart rhythm with  sinus rhythm with PVCs and PACs when connected to the monitor.  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  He had undergone a cardiac catheterization in approximately 1985.  Recently, he has noticed chest tightness when working out and also has noticed significant increasing shortness of breath.  He has been aware of a somewhat irregular rhythm.  Over several months.  In July he was recently treated with diverticular lightness with Cipro and Flagyl and had noticed increased palpitations at that time.  When I saw for initial evaluation on 09/27/2014 he was in atrial fibrillation which he was completely unaware of.  On further questioning, he omitted that he snores very loudly and has had significant episodes of witnessed apnea.  He can fall asleep during the day at any time if circumstances permit.  He has a history of hypertension.  Recently he had been taking atenolol 150 mg in the morning had taken testosterone injections once a week.  I changed his atenolol to 100 mg in the morning and 50 mg grams at night.  He was significantly hypertensive and I added amlodipine 5 mg to his medical regimen.  I scheduled him for a nuclear perfusion study which was done in 10/07/2014.  There were no ECG changes.  There was a small defect of mild severity in the basal inferior wall.  It was felt most  likely this was diaphragmatic attenuation.  Ejection fraction was 59%.  An echo Doppler study revealed an EF of 50-55% and raise the possibility of mild posterior basal hypokinesis.  He had mild AR and mild MR.  He was started on eliquis at his office visit.  On follow-up evaluation, his blood pressure was still elevated.  I increased his amlodipine to 7.5 mg in the morning and also started him on losartan 50 mg daily.  He was scheduled for sleep study, which had not yet been done.  He underwent a sleep study which confirm my suspicion for obstructive sleep apnea.  Overall sleep apnea was mild with an AHI of 12.3 , but events were moderate with supine sleep with an AHI of 22.8 per hour. He has significant oxygen desaturation to a nadir of 81% and on the initial study had reduced sleep efficiency at 61.8% and evidence for loud snoring.  He subsequently underwent a CPAP titration trial and he was titrated up to 8 cm water pressure with excellent result.  He underwent successful cardioversion for his atrial fibrillation  On 12/01/2014.Mario [name] He admits to one brief episode following the cardioversion while sleeping before CPAP therapy was initiated and this had reverted back to normal rhythm in the morning when he awakened  Since initiating CPAP therapy feel significantly improved.  However, at times when he sleeps on his back.  He still  feels he may not be getting enough pressure.  He has noticed occasional palpitation.  CPAP was initiated on November 14, 2014. He has a ResMed air fluid.  PT 10 pillow mask, medium size. A download was reviewed from 12/12/2014 through 01/10/2015. His compliance is excellent at 100% of usage.  He is averaging 8 hours and 24 minutes.  His AHI was still mildly elevated at 6.5, but significant improved initially.  There is no significant leak.  Since initiating CPAP he notes more energy. His snoring is markedly improved except at times while lying on his back.  He feels the air still  may be inadequate. He tells me he will need also undergo a biopsy and possible removal of a laryngeal nodule  by Dr. Redmond Baseman.  He presents for evaluation  Past Medical History  Diagnosis Date  . Hypertension   . Seasonal allergies   . Arthritis     neck  . Melanoma ([place]) 2000    back; stage 3  . Kidney stones   . Tubular adenoma of colon 2013    colonoscpy in 5 years [name]    Past Surgical History  Procedure Laterality Date  . Cataract extraction  2002, 2004    bilateral  . Melanoma removal  2000    back  . Tendon repair  2012    right  . Elbow surgery  1984    tendon repair left arm  . Tonsillectomy    . Cardioversion N/A 12/01/2014    Procedure: CARDIOVERSION;  Surgeon: Troy Sine, MD;  Location: [facility] END[facility]COPY;  Service: Cardiovascular;  Laterality: N/A;    Allergies  Allergen Reactions  . Lisinopril     REACTION: severe GI upset requiring EGD--temporally related to lisinopril--tolerated micardis    Current Outpatient Prescriptions  Medication Sig Dispense Refill  . amLODipine (NORVASC) 5 MG tablet Take 1.5 tablets (7.5 mg total) by mouth daily. 45 tablet 6  . apixaban (ELIQUIS) 5 MG TABS tablet Take 1 tablet (5 mg total) by mouth 2 (two) times daily. 60 tablet 6  . atenolol (TENORMIN) 50 MG tablet Take 1.5 tablets twice a day 90 tablet 6  . losartan (COZAAR) 100 MG tablet Take 1 tablet (100 mg total) by mouth daily. (Patient taking differently: Take 100 mg by mouth 2 (two) times daily. ) 90 tablet 3  . Multiple Vitamin (MULTIVITAMIN) tablet Take 1 tablet by mouth daily.    Mario [name] testosterone cypionate (DEPOTESTOSTERONE CYPIONATE) 200 MG/ML injection Inject 0.5 mg into the muscle once a week.     No current facility-administered medications for this visit.    Social History   Social History  . Marital Status: Married    Spouse Name: N/A  . Number of Children: 2  . Years of Education: N/A   Occupational History  . retired    Social History Main Topics  .  Smoking status: Former Smoker    Start date: 11/20/1976  . Smokeless tobacco: Never Used  . Alcohol Use: 12.6 oz/week    21 Cans of beer per week     Comment: occ   . Drug Use: No  . Sexual Activity: Not on file   Other Topics Concern  . Not on file   Social History Narrative   Additional social history is notable in that he is married for 41 years.  He has 2 children and 2 grandchildren.  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  He completed 12th grade  of education.  Remotely he had smoked but quit in 1977.  He drinks 3 beers per day.  He has been exercising fairly regularly at least 4 days per week including weightlifting and cardio for proximally 2 hours per session.  Family History  Problem Relation Age of Onset  . Colon cancer Neg Hx   . Stomach cancer Neg Hx   . Heart disease Mother    Family history is notable that his mother died at age 58 with heart disease and had atrial fibrillation and congestive heart failure.  Father died at 17 with a brain tumor.  He has 2 sisters and both have hypertension.   ROS General: Negative; No fevers, chills, or night sweats HEENT: Negative; No changes in vision or hearing, sinus congestion, difficulty swallowing Pulmonary: Negative; No cough, wheezing, shortness of breath, hemoptysis Cardiovascular:  See HPI;  GI: Negative; No nausea, vomiting, diarrhea, or abdominal pain GU: Negative; No dysuria, hematuria, or difficulty voiding Musculoskeletal: Negative; no myalgias, joint pain, or weakness Hematologic/Oncologic: Negative; no easy bruising, bleeding Endocrine: Negative; no heat/cold intolerance; no diabetes Neuro: Negative; no changes in balance, headaches Skin: Negative; No rashes or skin lesions Psychiatric: Negative; No behavioral problems, depression Sleep: Positive for loud snoring, frequent awakenings, nonrestorative sleep, and daytime sleepiness, hypersomnolence,; no bruxism, restless legs,  hypnogagnic hallucinations Other comprehensive 14 point system review is negative   Physical Exam BP 140/80 mmHg  Pulse 62  Ht 6' (1.829 m)  Wt 214 lb (97.07 kg)  BMI 29.02 kg/m2  Repeat blood pressure by me 150/78  Wt Readings from Last 3 Encounters:  01/12/15 214 lb (97.07 kg)  11/23/14 210 lb (95.255 kg)  11/01/14 210 lb (95.255 kg)   General: Alert, oriented, no distress.  Skin: normal turgor, no rashes, warm and dry HEENT: Normocephalic, atraumatic. Pupils equal round and reactive to light; sclera anicteric; extraocular muscles intact; Fundi mild arterial narrowing Nose without nasal septal hypertrophy Mouth/Parynx benign; Mallinpatti scale 3 Neck: No JVD, no carotid bruits; normal carotid upstroke Lungs: clear to ausculatation and percussion; no wheezing or rales Chest wall: without tenderness to palpitation Heart: PMI not displaced, irregularly irregular rhythm with a ventricular rate in the 50's, s1 s2 normal, 1/6 systolic murmur, no diastolic murmur, no rubs, gallops, thrills, or heaves Abdomen: soft, nontender; no hepatosplenomehaly, BS+; abdominal aorta nontender and not dilated by palpation. Back: no CVA tenderness Pulses 2+ Musculoskeletal: full range of motion, normal strength, no joint deformities Extremities: no clubbing cyanosis or edema, Homan's sign negative  Neurologic: grossly nonfocal; Cranial nerves grossly wnl Psychologic: Normal mood and affect  ECG (independently read by me):  Normal sinus rhythm at 62 bpm. Nonspecific ST-T changes.  September 2016 ECG (independently read by me): Atrial fibrillation with a slow ventricular response in the 50s.  QTc interval 364 ms.  Prior CG (independently read by me):  Atrial fibrillation with ventricular rate at 52 bpm.  No significant ST-T change.  09/27/2014 ECG (independently read by me): Atrial fibrillation with ventricular rate at 59 bpm.  Nonspecific ST-T changes.  LABS:  BMP Latest Ref Rng 11/24/2014  10/21/2014 09/27/2014  Glucose 65 - 99 mg/dL 85 90 90  BUN 7 - 25 mg/dL _0 Creatinine 0.70 - 1.18 mg/dL 1.10 1.08 1.16  Sodium 135 - 146 mmol/L 137 137 136  Potassium 3.5 - 5.3 mmol/L 4.3 4.5 4.4  Chloride 98 - 110 mmol/L 98 101 100  CO2 20 - 31 mmol/L _1 Calcium 8.6 -  10.3 mg/dL 9.5 9.2 9.3     Hepatic Function Latest Ref Rng 10/21/2014 09/27/2014 09/02/2014  Total Protein 6.1 - 8.1 g/dL 6.8 6.4 6.5  Albumin 3.6 - 5.1 g/dL 4.0 4.0 3.7  AST 10 - 35 U/L 31 35 18  ALT 9 - 46 U/L 42 68(H) 11  Alk Phosphatase 40 - 115 U/L 81 77 69  Total Bilirubin 0.2 - 1.2 mg/dL 1.1 1.2 0.8    CBC Latest Ref Rng 11/24/2014 09/27/2014 09/02/2014  WBC 4.0 - 10.5 K/uL 6.0 7.5 10.6(H)  Hemoglobin 13.0 - 17.0 g/dL 15.9 16.0 16.0  Hematocrit 39.0 - 52.0 % 45.1 46.8 45.9  Platelets 150 - 400 K/uL 169 175 194   Lab Results  Component Value Date   MCV 91.1 11/24/2014   MCV 92.7 09/27/2014   MCV 92.5 09/02/2014   Lab Results  Component Value Date   TSH 3.363 09/27/2014   Lab Results  Component Value Date   HGBA1C 4.9 05/06/2006     BNP No results found for: BNP  ProBNP No results found for: PROBNP   Lipid Panel     Component Value Date/Time   CHOL 160 10/21/2014 1055   TRIG 119 10/21/2014 1055   HDL 41 10/21/2014 1055   CHOLHDL 3.9 10/21/2014 1055   VLDL 24 10/21/2014 1055   LDLCALC 95 10/21/2014 1055    RADIOLOGY: No results found.   ASSESSMENT AND PLAN: Mr. Gurfateh Mcclain is a 74 year old male who was recently found to be in atrial fibrillation  Was started on anticoagulation.  He has been on beta blocker therapy and was started on ARB therapy with losartan.  Due to high suspicion for sleep apnea, a subsequent sleep study revealed  Mild tomoderate sleep apnea  Overall, but moderate sleep apnea during supine sleep within HI of 22.8 per hour.  He feels significantly improved since initiating CPAP therapy.  His blood pressure today is stable. At this point, with his positional  component noted on his study.  I am recommending changing him from a fixed pressure of 8 cm to a CPAP auto which will allow for increased increased pressures with supine sleep. It has  been over a month since his cardioversion and he is maintaining sinus rhythm.  He is in need for a biopsy and possible removal of a laryngeal nodule  by Dr. Redmond Baseman. This can be done but he will need to hold his eliquis for a minimum of 48 hours prior to undergoing the procedure.  With some occasional palpitations I am further titrating his atenolol to 75 mg twice a day from his present dose.  He will continue taking the losartan 100 mg daily as well as amlodipine.  Once he is stable from a surgical standpoint and is given clearance to resume anticoagulation eliquis will need to be reinstituted.  As long as he is stable, I will see him in 6 months for reevaluation.  Time spent: 25 minutes  Troy Sine, MD, [facility] 01/12/2015 9:16 PM

## 2015-01-16 DIAGNOSIS — Z85820 Personal history of malignant melanoma of skin: Secondary | ICD-10-CM | POA: Diagnosis not present

## 2015-01-16 DIAGNOSIS — D1801 Hemangioma of skin and subcutaneous tissue: Secondary | ICD-10-CM | POA: Diagnosis not present

## 2015-01-16 DIAGNOSIS — D485 Neoplasm of uncertain behavior of skin: Secondary | ICD-10-CM | POA: Diagnosis not present

## 2015-01-16 DIAGNOSIS — Z85828 Personal history of other malignant neoplasm of skin: Secondary | ICD-10-CM | POA: Diagnosis not present

## 2015-01-16 DIAGNOSIS — L821 Other seborrheic keratosis: Secondary | ICD-10-CM | POA: Diagnosis not present

## 2015-01-16 DIAGNOSIS — L565 Disseminated superficial actinic porokeratosis (DSAP): Secondary | ICD-10-CM | POA: Diagnosis not present

## 2015-01-16 DIAGNOSIS — L570 Actinic keratosis: Secondary | ICD-10-CM | POA: Diagnosis not present

## 2015-01-16 DIAGNOSIS — L820 Inflamed seborrheic keratosis: Secondary | ICD-10-CM | POA: Diagnosis not present

## 2015-01-16 DIAGNOSIS — L111 Transient acantholytic dermatosis [Grover]: Secondary | ICD-10-CM | POA: Diagnosis not present

## 2015-01-17 ENCOUNTER — Encounter: Payer: Self-pay | Admitting: Family Medicine

## 2015-01-17 DIAGNOSIS — J387 Other diseases of larynx: Secondary | ICD-10-CM | POA: Insufficient documentation

## 2015-01-17 DIAGNOSIS — R69 Illness, unspecified: Secondary | ICD-10-CM | POA: Diagnosis not present

## 2015-01-24 ENCOUNTER — Encounter (HOSPITAL_COMMUNITY): Payer: Self-pay | Admitting: *Deleted

## 2015-01-24 ENCOUNTER — Other Ambulatory Visit (HOSPITAL_COMMUNITY): Payer: Self-pay | Admitting: Otolaryngology

## 2015-01-24 NOTE — Addendum Note (Signed)
Addended by: Melida Quitter D on: 01/24/2015 04:09 PM   Modules accepted: Orders

## 2015-01-25 ENCOUNTER — Encounter (HOSPITAL_COMMUNITY)
Admission: RE | Disposition: A | Discharge status: Home / Self Care | Payer: Self-pay | Source: Ambulatory Visit | Attending: Otolaryngology

## 2015-01-25 ENCOUNTER — Ambulatory Visit (HOSPITAL_COMMUNITY): Payer: Medicare HMO | Admitting: Anesthesiology

## 2015-01-25 ENCOUNTER — Ambulatory Visit (HOSPITAL_COMMUNITY)
Admission: RE | Admit: 2015-01-25 | Discharge: 2015-01-25 | Disposition: A | Discharge status: Home / Self Care | Payer: Medicare HMO | Source: Ambulatory Visit | Attending: Otolaryngology | Admitting: Otolaryngology

## 2015-01-25 ENCOUNTER — Encounter (HOSPITAL_COMMUNITY): Payer: Self-pay | Admitting: *Deleted

## 2015-01-25 DIAGNOSIS — Z79899 Other long term (current) drug therapy: Secondary | ICD-10-CM | POA: Insufficient documentation

## 2015-01-25 DIAGNOSIS — Z85820 Personal history of malignant melanoma of skin: Secondary | ICD-10-CM | POA: Diagnosis not present

## 2015-01-25 DIAGNOSIS — M1990 Unspecified osteoarthritis, unspecified site: Secondary | ICD-10-CM | POA: Diagnosis not present

## 2015-01-25 DIAGNOSIS — I10 Essential (primary) hypertension: Secondary | ICD-10-CM | POA: Insufficient documentation

## 2015-01-25 DIAGNOSIS — D1809 Hemangioma of other sites: Secondary | ICD-10-CM | POA: Insufficient documentation

## 2015-01-25 DIAGNOSIS — J387 Other diseases of larynx: Secondary | ICD-10-CM | POA: Diagnosis not present

## 2015-01-25 DIAGNOSIS — Z87891 Personal history of nicotine dependence: Secondary | ICD-10-CM | POA: Diagnosis not present

## 2015-01-25 DIAGNOSIS — Z7901 Long term (current) use of anticoagulants: Secondary | ICD-10-CM | POA: Diagnosis not present

## 2015-01-25 DIAGNOSIS — D491 Neoplasm of unspecified behavior of respiratory system: Secondary | ICD-10-CM | POA: Diagnosis not present

## 2015-01-25 HISTORY — DX: Phlebitis and thrombophlebitis of unspecified site: I80.9

## 2015-01-25 HISTORY — PX: MICROLARYNGOSCOPY WITH LASER: SHX5972

## 2015-01-25 HISTORY — DX: Myoneural disorder, unspecified: G70.9

## 2015-01-25 HISTORY — DX: Pneumonia, unspecified organism: J18.9

## 2015-01-25 HISTORY — DX: Reserved for inherently not codable concepts without codable children: IMO0001

## 2015-01-25 HISTORY — DX: Cardiac arrhythmia, unspecified: I49.9

## 2015-01-25 HISTORY — DX: Personal history of urinary calculi: Z87.442

## 2015-01-25 LAB — BASIC METABOLIC PANEL
Anion gap: 7 (ref 5–15)
BUN: 17 mg/dL (ref 6–20)
CHLORIDE: 106 mmol/L (ref 101–111)
CO2: 23 mmol/L (ref 22–32)
CREATININE: 1.16 mg/dL (ref 0.61–1.24)
Calcium: 9.4 mg/dL (ref 8.9–10.3)
Glucose, Bld: 102 mg/dL — ABNORMAL HIGH (ref 65–99)
POTASSIUM: 4.6 mmol/L (ref 3.5–5.1)
SODIUM: 136 mmol/L (ref 135–145)

## 2015-01-25 LAB — CBC
HCT: 45.8 % (ref 39.0–52.0)
Hemoglobin: 16 g/dL (ref 13.0–17.0)
MCH: 31.4 pg (ref 26.0–34.0)
MCHC: 34.9 g/dL (ref 30.0–36.0)
MCV: 90 fL (ref 78.0–100.0)
PLATELETS: 175 10*3/uL (ref 150–400)
RBC: 5.09 MIL/uL (ref 4.22–5.81)
RDW: 13.5 % (ref 11.5–15.5)
WBC: 5.5 10*3/uL (ref 4.0–10.5)

## 2015-01-25 SURGERY — MICROLARYNGOSCOPY, WITH PROCEDURE USING LASER
Anesthesia: General

## 2015-01-25 MED ORDER — MIDAZOLAM HCL 2 MG/2ML IJ SOLN
INTRAMUSCULAR | Status: AC
  Filled 2015-01-25: qty 2

## 2015-01-25 MED ORDER — EPINEPHRINE HCL 1 MG/ML IJ SOLN
INTRAMUSCULAR | Status: DC | PRN
  Administered 2015-01-25: 30 mL

## 2015-01-25 MED ORDER — LIDOCAINE HCL (CARDIAC) 20 MG/ML IV SOLN
INTRAVENOUS | Status: AC
  Filled 2015-01-25: qty 5

## 2015-01-25 MED ORDER — SUGAMMADEX SODIUM 200 MG/2ML IV SOLN
INTRAVENOUS | Status: DC | PRN
  Administered 2015-01-25: 200 mg via INTRAVENOUS

## 2015-01-25 MED ORDER — 0.9 % SODIUM CHLORIDE (POUR BTL) OPTIME
TOPICAL | Status: DC | PRN
  Administered 2015-01-25: 1000 mL

## 2015-01-25 MED ORDER — ONDANSETRON HCL 4 MG/2ML IJ SOLN
INTRAMUSCULAR | Status: AC
  Filled 2015-01-25: qty 2

## 2015-01-25 MED ORDER — HYDROCODONE-ACETAMINOPHEN 7.5-325 MG/15ML PO SOLN: 15.0000 mL | Freq: Four times a day (QID) | ORAL | Status: DC | PRN

## 2015-01-25 MED ORDER — ROCURONIUM BROMIDE 50 MG/5ML IV SOLN
INTRAVENOUS | Status: AC
  Filled 2015-01-25: qty 1

## 2015-01-25 MED ORDER — LACTATED RINGERS IV SOLN
INTRAVENOUS | Status: DC
  Administered 2015-01-25: 10:00:00 via INTRAVENOUS

## 2015-01-25 MED ORDER — MIDAZOLAM HCL 5 MG/5ML IJ SOLN
INTRAMUSCULAR | Status: DC | PRN
  Administered 2015-01-25: 2 mg via INTRAVENOUS

## 2015-01-25 MED ORDER — SODIUM CHLORIDE 0.9 % IJ SOLN
INTRAMUSCULAR | Status: AC
  Filled 2015-01-25: qty 10

## 2015-01-25 MED ORDER — LIDOCAINE HCL (CARDIAC) 20 MG/ML IV SOLN
INTRAVENOUS | Status: DC | PRN
  Administered 2015-01-25: 40 mg via INTRAVENOUS

## 2015-01-25 MED ORDER — DEXAMETHASONE SODIUM PHOSPHATE 10 MG/ML IJ SOLN
INTRAMUSCULAR | Status: DC | PRN
  Administered 2015-01-25: 10 mg via INTRAVENOUS

## 2015-01-25 MED ORDER — HYDROCODONE-ACETAMINOPHEN 7.5-325 MG/15ML PO SOLN
15.0000 mL | Freq: Once | ORAL | Status: AC
  Administered 2015-01-25: 15 mL via ORAL

## 2015-01-25 MED ORDER — HYDROCODONE-ACETAMINOPHEN 7.5-325 MG/15ML PO SOLN
ORAL | Status: AC
  Filled 2015-01-25: qty 15

## 2015-01-25 MED ORDER — ROCURONIUM BROMIDE 100 MG/10ML IV SOLN
INTRAVENOUS | Status: DC | PRN
  Administered 2015-01-25: 20 mg via INTRAVENOUS
  Administered 2015-01-25: 40 mg via INTRAVENOUS
  Administered 2015-01-25: 10 mg via INTRAVENOUS

## 2015-01-25 MED ORDER — FENTANYL CITRATE (PF) 250 MCG/5ML IJ SOLN
INTRAMUSCULAR | Status: AC
  Filled 2015-01-25: qty 5

## 2015-01-25 MED ORDER — DEXAMETHASONE SODIUM PHOSPHATE 10 MG/ML IJ SOLN
INTRAMUSCULAR | Status: AC
  Filled 2015-01-25: qty 1

## 2015-01-25 MED ORDER — GLYCOPYRROLATE 0.2 MG/ML IJ SOLN
INTRAMUSCULAR | Status: DC | PRN
  Administered 2015-01-25: 0.2 mg via INTRAVENOUS

## 2015-01-25 MED ORDER — SUGAMMADEX SODIUM 200 MG/2ML IV SOLN
INTRAVENOUS | Status: AC
  Filled 2015-01-25: qty 2

## 2015-01-25 MED ORDER — EPINEPHRINE HCL (NASAL) 0.1 % NA SOLN
NASAL | Status: AC
  Filled 2015-01-25: qty 30

## 2015-01-25 MED ORDER — PROPOFOL 10 MG/ML IV BOLUS
INTRAVENOUS | Status: DC | PRN
  Administered 2015-01-25: 130 mg via INTRAVENOUS

## 2015-01-25 MED ORDER — FENTANYL CITRATE (PF) 100 MCG/2ML IJ SOLN
INTRAMUSCULAR | Status: DC | PRN
Start: 2015-01-25 — End: 2015-01-25
  Administered 2015-01-25: 150 ug via INTRAVENOUS
  Administered 2015-01-25: 50 ug via INTRAVENOUS

## 2015-01-25 MED ORDER — FENTANYL CITRATE (PF) 100 MCG/2ML IJ SOLN: 25.0000 ug | INTRAMUSCULAR | Status: DC | PRN

## 2015-01-25 MED ORDER — PROPOFOL 10 MG/ML IV BOLUS
INTRAVENOUS | Status: AC
  Filled 2015-01-25: qty 20

## 2015-01-25 SURGICAL SUPPLY — 29 items
BALLN PULM 12 13.5 15X75 (BALLOONS)
BALLN PULMONARY 10-12 (MISCELLANEOUS) IMPLANT
BALLOON PULM 12 13.5 15X75 (BALLOONS) IMPLANT
BANDAGE EYE OVAL (MISCELLANEOUS) ×2 IMPLANT
BLADE SURG 15 STRL LF DISP TIS (BLADE) IMPLANT
BLADE SURG 15 STRL SS (BLADE)
CANISTER SUCTION 2500CC (MISCELLANEOUS) ×2 IMPLANT
COVER MAYO STAND STRL (DRAPES) ×1 IMPLANT
COVER TABLE BACK 60X90 (DRAPES) ×2 IMPLANT
CRADLE DONUT ADULT HEAD (MISCELLANEOUS) ×1 IMPLANT
DRAPE PROXIMA HALF (DRAPES) ×2 IMPLANT
GAUZE SPONGE 4X4 12PLY STRL (GAUZE/BANDAGES/DRESSINGS) ×2 IMPLANT
GLOVE BIO SURGEON STRL SZ7.5 (GLOVE) ×2 IMPLANT
GOWN STRL REUS W/ TWL LRG LVL3 (GOWN DISPOSABLE) IMPLANT
GOWN STRL REUS W/TWL LRG LVL3 (GOWN DISPOSABLE)
KIT BASIN OR (CUSTOM PROCEDURE TRAY) ×2 IMPLANT
KIT ROOM TURNOVER OR (KITS) ×2 IMPLANT
NDL HYPO 25GX1X1/2 BEV (NEEDLE) IMPLANT
NEEDLE HYPO 25GX1X1/2 BEV (NEEDLE) IMPLANT
NS IRRIG 1000ML POUR BTL (IV SOLUTION) ×2 IMPLANT
PAD ARMBOARD 7.5X6 YLW CONV (MISCELLANEOUS) ×4 IMPLANT
PATTIES SURGICAL .5 X3 (DISPOSABLE) ×2 IMPLANT
PATTIES SURGICAL .5X1.5 (GAUZE/BANDAGES/DRESSINGS) ×1 IMPLANT
PENCIL BUTTON HOLSTER BLD 10FT (ELECTRODE) ×1 IMPLANT
SOLUTION ANTI FOG 6CC (MISCELLANEOUS) ×1 IMPLANT
SURGILUBE 2OZ TUBE FLIPTOP (MISCELLANEOUS) IMPLANT
SUT SILK 2 0 FS (SUTURE) IMPLANT
TOWEL OR 17X24 6PK STRL BLUE (TOWEL DISPOSABLE) ×4 IMPLANT
TUBE CONNECTING 12X1/4 (SUCTIONS) ×2 IMPLANT

## 2015-01-25 NOTE — Brief Op Note (Signed)
01/25/2015  12:45 PM  PATIENT:  Mario [name]  74 y.o. male  PRE-OPERATIVE DIAGNOSIS:  laryngeal mass  POST-OPERATIVE DIAGNOSIS:  laryngeal mass  PROCEDURE:  Procedure(s) with comments: MICROLARYNGOSCOPY  (N/A) - Microlaryngoscopy with 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 with CO2 laser   SURGEON:  Surgeon(s) and Role:    * Melida Quitter, MD - Primary  PHYSICIAN ASSISTANT:   ASSISTANTS: none   ANESTHESIA:   general  EBL:     BLOOD ADMINISTERED:none  DRAINS: none   LOCAL MEDICATIONS USED:  NONE  SPECIMEN:  Source of Specimen:  right medial pyriform sinus wall mass  DISPOSITION OF SPECIMEN:  PATHOLOGY  COUNTS:  YES  TOURNIQUET:  * No tourniquets in log *  DICTATION: .Other Dictation: Dictation Number [id number]  PLAN OF CARE: Discharge to home after PACU  PATIENT DISPOSITION:  PACU - hemodynamically stable.   Delay start of Pharmacological VTE agent (>24hrs) due to surgical blood loss or risk of bleeding: yes

## 2015-01-25 NOTE — Anesthesia Postprocedure Evaluation (Signed)
Anesthesia Post Note  Patient: Mario [name]  Procedure(s) Performed: Procedure(s) (LRB): MICROLARYNGOSCOPY  (N/A)  Patient location during evaluation: PACU Anesthesia Type: General Level of consciousness: awake and alert Pain management: pain level controlled Vital Signs Assessment: post-procedure vital signs reviewed and stable Respiratory status: spontaneous breathing, nonlabored ventilation and respiratory function stable Cardiovascular status: blood pressure returned to baseline and stable Postop Assessment: no signs of nausea or vomiting Anesthetic complications: no    Last Vitals:  Filed Vitals:   01/25/15 1320 01/25/15 1335  BP: 156/77   Pulse: 57 62  Temp:    Resp: 12 14    Last Pain: There were no vitals filed for this visit.               Ahja Martello,W. [place]MOND

## 2015-01-25 NOTE — Transfer of Care (Signed)
Immediate Anesthesia Transfer of Care Note  Patient: Mario [name]  Procedure(s) Performed: Procedure(s) with comments: MICROLARYNGOSCOPY  (N/A) - Microlaryngoscopy with 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 with CO2 laser   Patient Location: PACU  Anesthesia Type:General  Level of Consciousness: awake, alert , oriented and patient cooperative  Airway & Oxygen Therapy: Patient Spontanous Breathing and Patient connected to nasal cannula oxygen  Post-op Assessment: Report given to RN and Post -op Vital signs reviewed and stable  Post vital signs: Reviewed and stable  Last Vitals:  Filed Vitals:   01/25/15 0933 01/25/15 1305  BP: 150/69 152/70  Pulse: 56 66  Temp: 36.4 C 36.4 C  Resp: 99 15    Complications: No apparent anesthesia complications

## 2015-01-25 NOTE — Anesthesia Procedure Notes (Addendum)
Procedure Name: Intubation Date/Time: 01/25/2015 11:53 AM Performed by: Melina Copa, Zackariah Vanderpol R Pre-anesthesia Checklist: Patient identified, Emergency Drugs available, Suction available, Patient being monitored and Timeout performed Patient Re-evaluated:Patient Re-evaluated prior to inductionOxygen Delivery Method: Circle system utilized Preoxygenation: Pre-oxygenation with 100% oxygen Intubation Type: IV induction Ventilation: Mask ventilation without difficulty Laryngoscope Size: Mac and 4 Grade View: Grade I Laser Tube: Laser Tube and Cuffed inflated with minimal occlusive pressure - saline Tube size: 6.0 mm Number of attempts: 1 Airway Equipment and Method: Stylet Placement Confirmation: ETT inserted through vocal cords under direct vision,  positive ETCO2 and breath sounds checked- equal and bilateral Secured at: 22 cm Tube secured with: Tape Dental Injury: Teeth and Oropharynx as per pre-operative assessment

## 2015-01-25 NOTE — Anesthesia Preprocedure Evaluation (Addendum)
Anesthesia Evaluation  Patient identified by MRN, date of birth, ID band Patient awake    Reviewed: Allergy & Precautions, H&P , NPO status , Patient's Chart, lab work & pertinent test results, reviewed documented beta blocker date and time   Airway Mallampati: II  TM Distance: >3 FB Neck ROM: Full    Dental no notable dental hx. (+) Teeth Intact, Dental Advisory Given   Pulmonary sleep apnea and Continuous Positive Airway Pressure Ventilation , former smoker,    Pulmonary exam normal breath sounds clear to auscultation       Cardiovascular hypertension, Pt. on medications and Pt. on home beta blockers + dysrhythmias Atrial Fibrillation  Rhythm:Regular Rate:Normal     Neuro/Psych negative neurological ROS  negative psych ROS   GI/Hepatic negative GI ROS, Neg liver ROS,   Endo/Other  negative endocrine ROS  Renal/GU Renal disease  negative genitourinary   Musculoskeletal  (+) Arthritis , Osteoarthritis,    Abdominal   Peds  Hematology negative hematology ROS (+)   Anesthesia Other Findings   Reproductive/Obstetrics negative OB ROS                            Anesthesia Physical Anesthesia Plan  ASA: III  Anesthesia Plan: General   Post-op Pain Management:    Induction: Intravenous  Airway Management Planned: Oral ETT  Additional Equipment:   Intra-op Plan:   Post-operative Plan: Extubation in OR  Informed Consent: I have reviewed the patients History and Physical, chart, labs and discussed the procedure including the risks, benefits and alternatives for the proposed anesthesia with the patient or authorized representative who has indicated his/her understanding and acceptance.   Dental advisory given  Plan Discussed with: CRNA  Anesthesia Plan Comments:         Anesthesia Quick Evaluation

## 2015-01-25 NOTE — H&P (Signed)
Mario [name] is an 74 y.o. male.   Chief Complaint: Laryngeal mass HPI: 74 year old male had a mass found in the larynx during upper endoscopy.  In the office, he was found to have a vascular-appearing mass in the right arytenoid area.  He presents for microlaryngoscopy with biopsy versus removal.  He has had Eliquis for two days.  Past Medical History  Diagnosis Date  . Hypertension   . Seasonal allergies   . Arthritis     neck  . Tubular adenoma of colon 2013    colonoscpy in 5 years [name]  . Dysrhythmia     afib - cardioverted 01/2015  . Neuromuscular disorder ([place])   . History of kidney stones   . Pneumonia   . Shortness of breath dyspnea     with exertion  . Melanoma ([place]) 2000    back; stage 3, squamous 1 - basal- several  . Phlebitis     Past Surgical History  Procedure Laterality Date  . Cataract extraction  2002, 2004    bilateral  . Melanoma removal  2000    back  . Tendon repair Right 2012    right  . Elbow surgery Left 1984    tendon repair left arm  . Tonsillectomy    . Cardioversion N/A 12/01/2014    Procedure: CARDIOVERSION;  Surgeon: Troy Sine, MD;  Location: [place];  Service: Cardiovascular;  Laterality: N/A;  . Upper gi endoscopy    . Cystoscopy    . Lithotripsy      Family History  Problem Relation Age of Onset  . Colon cancer Neg Hx   . Stomach cancer Neg Hx   . Heart disease Mother    Social History:  reports that he has quit smoking. He started smoking about 38 years ago. He has never used smokeless tobacco. He reports that he does not drink alcohol or use illicit drugs.  Allergies:  Allergies  Allergen Reactions  . Lisinopril     REACTION: severe GI upset requiring EGD--temporally related to lisinopril--tolerated micardis    Medications Prior to Admission  Medication Sig Dispense Refill  . amLODipine (NORVASC) 5 MG tablet Take 1.5 tablets (7.5 mg total) by mouth daily. 45 tablet 6  . apixaban (ELIQUIS) 5 MG TABS tablet  Take 1 tablet (5 mg total) by mouth 2 (two) times daily. 60 tablet 6  . atenolol (TENORMIN) 50 MG tablet Take 1.5 tablets twice a day 90 tablet 6  . losartan (COZAAR) 100 MG tablet Take 1 tablet (100 mg total) by mouth daily. 90 tablet 3  . Multiple Vitamin (MULTIVITAMIN) tablet Take 1 tablet by mouth daily.    [name] testosterone cypionate (DEPOTESTOSTERONE CYPIONATE) 200 MG/ML injection Inject 100 mg into the muscle once a week.       Results for orders placed or performed during the hospital encounter of 01/25/15 (from the past 48 hour(s))  Basic metabolic panel     Status: Abnormal   Collection Time: 01/25/15  9:41 AM  Result Value Ref Range   Sodium 136 135 - 145 mmol/L   Potassium 4.6 3.5 - 5.1 mmol/L   Chloride 106 101 - 111 mmol/L   CO2 23 22 - 32 mmol/L   Glucose, Bld 102 (H) 65 - 99 mg/dL   BUN 17 6 - 20 mg/dL   Creatinine, Ser 1.16 0.61 - 1.24 mg/dL   Calcium 9.4 8.9 - 10.3 mg/dL   GFR calc non Af Amer >60 >60 mL/min  GFR calc Af Amer >60 >60 mL/min    Comment: (NOTE) The eGFR has been calculated using the CKD EPI equation. This calculation has not been validated in all clinical situations. eGFR's persistently <60 mL/min signify possible Chronic Kidney Disease.    Anion gap 7 5 - 15  CBC     Status: None   Collection Time: 01/25/15  9:41 AM  Result Value Ref Range   WBC 5.5 4.0 - 10.5 K/uL   RBC 5.09 4.22 - 5.81 MIL/uL   Hemoglobin 16.0 13.0 - 17.0 g/dL   HCT 45.8 39.0 - 52.0 %   MCV 90.0 78.0 - 100.0 fL   MCH 31.4 26.0 - 34.0 pg   MCHC 34.9 30.0 - 36.0 g/dL   RDW 13.5 11.5 - 15.5 %   Platelets 175 150 - 400 K/uL   No results found.  Review of Systems  HENT:       Throat fullness.  All other systems reviewed and are negative.   Blood pressure 150/69, pulse 56, temperature 97.5 F (36.4 C), temperature source Oral, resp. rate 99, height 6' (1.829 m), weight 97.07 kg (214 lb), SpO2 97 %. Physical Exam  Constitutional: He is oriented to person, place, and time.  He appears well-developed and well-nourished. No distress.  HENT:  Head: Normocephalic and atraumatic.  Right Ear: External ear normal.  Left Ear: External ear normal.  Nose: Nose normal.  Mouth/Throat: Oropharynx is clear and moist.  Eyes: Conjunctivae and EOM are normal. Pupils are equal, round, and reactive to light.  Neck: Normal range of motion. Neck supple.  Cardiovascular: Normal rate.   Respiratory: Effort normal.  Musculoskeletal: Normal range of motion.  Neurological: He is alert and oriented to person, place, and time. No cranial nerve deficit.  Skin: Skin is warm and dry.  Psychiatric: He has a normal mood and affect. His behavior is normal. Judgment and thought content normal.     Assessment/Plan Laryngeal mass To OR for microlaryngoscopy with biopsy versus removal, likely CO2 laser use.  May observe in hospital overnight versus discharge from recovery, depending on extent of surgery.  Mario [name] 01/25/2015, 11:39 AM

## 2015-01-26 ENCOUNTER — Encounter (HOSPITAL_COMMUNITY): Payer: Self-pay | Admitting: Otolaryngology

## 2015-01-26 NOTE — Op Note (Signed)
NAME:  THARON, SCHLEIFER NO.:  [id number]  MEDICAL RECORD NO.:  [id number]  LOCATION:  [facility]                         FACILITY:  [facility]  PHYSICIAN:  Onnie Graham, MD     DATE OF BIRTH:  [birth year]  DATE OF PROCEDURE:  01/25/2015 DATE OF DISCHARGE:  01/25/2015                              OPERATIVE REPORT   PREOPERATIVE DIAGNOSIS:  Right medial piriform sinus mass.  POSTOPERATIVE DIAGNOSIS:  Right medial piriform sinus mass.  PROCEDURE:  Suspended microdirect laryngoscopy with CO2 laser excision of right medial piriform sinus mass.  SURGEON:  Leane Para. Redmond Baseman, MD  ANESTHESIA:  General endotracheal anesthesia.  COMPLICATIONS:  None.  INDICATION:  The patient is a 74 year old male who recently underwent upper endoscopy and a mass was incidentally seen on the right larynx. Upon flexible laryngoscopy in the office, there was a vascular-appearing mass on the lateral surface of the right arytenoid or what corresponds to the medial piriform sinus wall.  He has since felt like he can feel the mass in his throat and he presents to the operating for biopsy versus removal.  FINDINGS:  The mass was multilobulated and submucosal and very soft as a fluid-filled.  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  The mass was removed from the medial piriform sinus wall and sent for pathology.  DESCRIPTION OF PROCEDURE:  The patient was identified in the holding room and informed consent having been obtained including discussion of risks, benefits, alternatives, the patient was brought to the operative suite, and put on operative table in supine position.  Anesthesia was induced and the patient was intubated by anesthesia team without difficulty.  A small laser safe tube was used.  The eyes were taped closed and bed was turned 90 degrees from anesthesia.  The patient was given intravenous Decadron during the case.  A tooth guard was placed over the  upper teeth and a Storz laryngoscope was then placed in the supraglottic position and off to the right side exposing the right piriform sinus.  This was placed in suspension on Mayo stand.  A 0- degree telescope was used to make a preoperative photograph.  Damp eye pads were taped over the eyes and a damp towel was placed over the face. An epinephrine-soaked pledget was held against the area for a couple minutes and then removed.  The mass was grasped with a cup forceps and retracted laterally while an incision was made with CO2 laser on a setting of 4 watts along the superior edge of the medial piriform sinus wall.  Dissection was then performed, trying to stay deep to the mass, but stayed close to the mass using the CO2 laser and careful dissection. Bleeding was controlled at points using an epinephrine-soaked pledgets followed as well as by using the CO2 laser and electrocautery applied to the suction tip.  Continued dissection was then performed and the mass extended down toward the apex of the piriform sinus.  Ultimately, the mass was able to be removed with overlying mucosa and was passed to nursing for pathology.  Bleeding was controlled with epinephrine soaked pledgets.  The resulting wound was then  photographed with a 0-degree telescope.  The area was then sprayed with topical lidocaine and the larynx and pharynx were suctioned while the laryngoscope was taken out of suspension and removed from the patient's mouth.  The tooth guard was removed.  The towel and damp eye pads removed.  He was returned back to Anesthesia for wake up, was extubated and moved to recovery room in stable condition.     Onnie Graham, MD     DDB/MEDQ  D:  01/25/2015  T:  01/26/2015  Job:  [id number]

## 2015-02-11 DIAGNOSIS — G4733 Obstructive sleep apnea (adult) (pediatric): Secondary | ICD-10-CM | POA: Diagnosis not present

## 2015-02-14 ENCOUNTER — Telehealth: Payer: Self-pay | Admitting: Cardiovascular Disease

## 2015-02-14 ENCOUNTER — Ambulatory Visit (INDEPENDENT_AMBULATORY_CARE_PROVIDER_SITE_OTHER): Payer: Medicare HMO | Admitting: *Deleted

## 2015-02-14 VITALS — BP 140/76 | HR 53 | Ht 72.0 in | Wt 210.0 lb

## 2015-02-14 DIAGNOSIS — I4891 Unspecified atrial fibrillation: Secondary | ICD-10-CM

## 2015-02-14 NOTE — Progress Notes (Signed)
Discussed with dr Claiborne Billings. Pt appt moved up to Friday this week. Pt made aware.

## 2015-02-14 NOTE — Telephone Encounter (Signed)
Mr. Palms is calling because he is back in AFIB . Please call   Thanks

## 2015-02-14 NOTE — Progress Notes (Signed)
Pt here and EKG confirms atrial fib with a rate of 53. Reassurance given to the pt, questions regarding atrial fib and treatment options answered. Pt given an appt to see dr Claiborne Billings 03-09-15. Will discuss with dr Claiborne Billings and call the pt later today. Pt agreed with this plan.

## 2015-02-14 NOTE — Telephone Encounter (Signed)
Spoke with pt, his DCCV was 11-2014, he has done well since then. Saturday he had a sneezing attack and then just felt bad, he went to bed. Yesterday morning when he got up he could feel his heart flip, flopping. He states his pulse feels different than the atrial fib he has felt in the past. His bp is a little higher 145/70 and his pulse is 72. He does feel better today. He is also had some issues with his CPAP that he thinks he has gotten resolved. He would like to come to the office for an EKG to see what rhythm he is in.  Follow up scheduled

## 2015-02-17 ENCOUNTER — Ambulatory Visit (INDEPENDENT_AMBULATORY_CARE_PROVIDER_SITE_OTHER): Payer: Medicare HMO | Admitting: Cardiovascular Disease

## 2015-02-17 VITALS — BP 130/70 | HR 58 | Ht 72.0 in | Wt 213.4 lb

## 2015-02-17 DIAGNOSIS — G4733 Obstructive sleep apnea (adult) (pediatric): Secondary | ICD-10-CM

## 2015-02-17 DIAGNOSIS — Z7901 Long term (current) use of anticoagulants: Secondary | ICD-10-CM

## 2015-02-17 DIAGNOSIS — J381 Polyp of vocal cord and larynx: Secondary | ICD-10-CM

## 2015-02-17 DIAGNOSIS — I480 Paroxysmal atrial fibrillation: Secondary | ICD-10-CM | POA: Diagnosis not present

## 2015-02-17 DIAGNOSIS — Z9989 Dependence on other enabling machines and devices: Secondary | ICD-10-CM

## 2015-02-17 DIAGNOSIS — Z79899 Other long term (current) drug therapy: Secondary | ICD-10-CM | POA: Diagnosis not present

## 2015-02-17 DIAGNOSIS — I10 Essential (primary) hypertension: Secondary | ICD-10-CM

## 2015-02-17 DIAGNOSIS — I4891 Unspecified atrial fibrillation: Secondary | ICD-10-CM | POA: Diagnosis not present

## 2015-02-17 DIAGNOSIS — J387 Other diseases of larynx: Secondary | ICD-10-CM

## 2015-02-17 LAB — CBC
HCT: 45.3 % (ref 39.0–52.0)
Hemoglobin: 16 g/dL (ref 13.0–17.0)
MCH: 31.5 pg (ref 26.0–34.0)
MCHC: 35.3 g/dL (ref 30.0–36.0)
MCV: 89.2 fL (ref 78.0–100.0)
MPV: 9.2 fL (ref 8.6–12.4)
PLATELETS: 208 10*3/uL (ref 150–400)
RBC: 5.08 MIL/uL (ref 4.22–5.81)
RDW: 14.7 % (ref 11.5–15.5)
WBC: 8.9 10*3/uL (ref 4.0–10.5)

## 2015-02-17 LAB — COMPREHENSIVE METABOLIC PANEL
ALBUMIN: 4.1 g/dL (ref 3.6–5.1)
ALT: 19 U/L (ref 9–46)
AST: 20 U/L (ref 10–35)
Alkaline Phosphatase: 67 U/L (ref 40–115)
BUN: 24 mg/dL (ref 7–25)
CALCIUM: 9.8 mg/dL (ref 8.6–10.3)
CHLORIDE: 102 mmol/L (ref 98–110)
CO2: 26 mmol/L (ref 20–31)
CREATININE: 1.37 mg/dL — AB (ref 0.70–1.18)
Glucose, Bld: 75 mg/dL (ref 65–99)
Potassium: 4.6 mmol/L (ref 3.5–5.3)
SODIUM: 138 mmol/L (ref 135–146)
Total Bilirubin: 0.9 mg/dL (ref 0.2–1.2)
Total Protein: 6.8 g/dL (ref 6.1–8.1)

## 2015-02-17 LAB — MAGNESIUM: MAGNESIUM: 1.9 mg/dL (ref 1.5–2.5)

## 2015-02-17 LAB — TSH: TSH: 2.626 u[IU]/mL (ref 0.350–4.500)

## 2015-02-17 MED ORDER — FLECAINIDE ACETATE 50 MG PO TABS: 50.0000 mg | ORAL_TABLET | Freq: Two times a day (BID) | ORAL | Status: DC

## 2015-02-17 NOTE — Patient Instructions (Addendum)
Medication Instructions:  DECREASE Atenolol - take 1 tablet (50 mg total) by mouth twice daily for 2 days THEN 0.5 tablet (25 mg total) by mouth twice daily START Flecainide 50 mg - take 1 tablet by mouth twice daily  >>A new prescription has been sent to the pharmacy electronically.  Labwork: Your physician recommends that you return for lab work TODAY.  Testing/Procedures: NONE  Follow-Up: Dr Claiborne Billings recommends that you schedule a follow-up appointment next week - please work him into Thursday, 02/23/15 at 11:45a per Dr Claiborne Billings.  If you need a refill on your cardiac medications before your next appointment, please call your pharmacy.    **If your heart rate is less than 54 bpm, HOLD ATENOLOL

## 2015-02-18 ENCOUNTER — Encounter: Payer: Self-pay | Admitting: Cardiovascular Disease

## 2015-02-18 NOTE — Progress Notes (Signed)
Patient ID: Jep Dyas, male   DOB: 06-20-1940, 75 y.o.   MRN: [id number]     Primary M.D.: Dr. Mallie Mussel   HPI:  Curran Lenderman is  Sandy Blouch is a 75 y.o. male  Has a history of PAF, and recent diagnosis of obstructive sleep apnea.  He presents for sleep clinic following initiation of CPAP therapy.  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  He underwent endoscopy by Dr. Erskine Emery and was noted to have an irregular heart rhythm with  sinus rhythm with PVCs and PACs when connected to the monitor.  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  He had undergone a cardiac catheterization in approximately 1985.  Recently, he has noticed chest tightness when working out and also has noticed significant increasing shortness of breath.  He has been aware of a somewhat irregular rhythm.  Over several months.  In July he was recently treated with diverticular lightness with Cipro and Flagyl and had noticed increased palpitations at that time.  When I saw for initial evaluation on 09/27/2014 he was in atrial fibrillation which he was completely unaware of.  On further questioning, he omitted that he snores very loudly and has had significant episodes of witnessed apnea.  He can fall asleep during the day at any time if circumstances permit.  He has a history of hypertension.  Recently he had been taking atenolol 150 mg in the morning had taken testosterone injections once a week.  I changed his atenolol to 100 mg in the morning and 50 mg grams at night.  He was significantly hypertensive and I added amlodipine 5 mg to his medical regimen.  I scheduled him for a nuclear perfusion study which was done in 10/07/2014.  There were no ECG changes.  There was a small defect of mild severity in the basal inferior wall.  It was felt most  likely this was diaphragmatic attenuation.  Ejection fraction was 59%.  An echo Doppler study revealed an EF of 50-55% and raise the possibility of mild posterior basal hypokinesis.  He had mild AR and mild MR.  He was started on eliquis at his office visit.  On follow-up evaluation, his blood pressure was still elevated.  I increased his amlodipine to 7.5 mg in the morning and also started him on losartan 50 mg daily.  He was scheduled for sleep study, which had not yet been done.  He underwent a sleep study which confirm my suspicion for obstructive sleep apnea.  Overall sleep apnea was mild with an AHI of 12.3 , but events were moderate with supine sleep with an AHI of 22.8 per hour. He has significant oxygen desaturation to a nadir of 81% and on the initial study had reduced sleep efficiency at 61.8% and evidence for loud snoring.  He subsequently underwent a CPAP titration trial and he was titrated up to 8 cm water pressure with excellent result.  He underwent successful cardioversion for his atrial fibrillation  On 12/01/2014. He admits to one brief episode following the cardioversion while sleeping before CPAP therapy was initiated and this had reverted back to normal rhythm in the morning when he awakened  CPAP was initiated on November 14, 2014. He has a ResMed air fluid.  PT 10 pillow mask, medium  size. A download was reviewed from 12/12/2014 through 01/10/2015. His compliance is excellent at 100% of usage.  He is averaging 8 hours and 24 minutes.  His AHI was still mildly elevated at 6.5, but significant improved initially.  There is no significant leak.  Since I last saw him, he had undergone biopsy of his laryngeal nodule, which was negative.  He had been feeling exceptionally well, but last Sunday while he was sitting in a choir.  He felt that his blood pressure had risen.  He began to sweat profusely.  Later that night he had a prolonged sneezing spell.  He went to sleep.  When he woke up in  the morning.  His heart rate was irregular and he was back in atrial fibrillation.  He has continued to use CPAP therapy.  He is seen now as an add-on for evaluation.  Past Medical History  Diagnosis Date  . Hypertension   . Seasonal allergies   . Arthritis     neck  . Tubular adenoma of colon 2013    colonoscpy in 5 years [name]  . Dysrhythmia     afib - cardioverted 01/2015  . Neuromuscular disorder ([place])   . History of kidney stones   . Pneumonia   . Shortness of breath dyspnea     with exertion  . Melanoma ([place]) 2000    back; stage 3, squamous 1 - basal- several  . Phlebitis     Past Surgical History  Procedure Laterality Date  . Cataract extraction  2002, 2004    bilateral  . Melanoma removal  2000    back  . Tendon repair Right 2012    right  . Elbow surgery Left 1984    tendon repair left arm  . Tonsillectomy    . Cardioversion N/A 12/01/2014    Procedure: CARDIOVERSION;  Surgeon: Troy Sine, MD;  Location: [facility];  Service: Cardiovascular;  Laterality: N/A;  . Upper gi endoscopy    . Cystoscopy    . Lithotripsy    . Microlaryngoscopy with laser N/A 01/25/2015    Procedure: MICROLARYNGOSCOPY ;  Surgeon: Melida Quitter, MD;  Location: [facility];  Service: ENT;  Laterality: N/A;  Microlaryngoscopy with 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 with CO2 laser     Allergies  Allergen Reactions  . Lisinopril     REACTION: severe GI upset requiring EGD--temporally related to lisinopril--tolerated micardis    Current Outpatient Prescriptions  Medication Sig Dispense Refill  . amLODipine (NORVASC) 5 MG tablet Take 1.5 tablets (7.5 mg total) by mouth daily. 45 tablet 6  . apixaban (ELIQUIS) 5 MG TABS tablet Take 1 tablet (5 mg total) by mouth 2 (two) times daily. 60 tablet 6  . atenolol (TENORMIN) 50 MG tablet Take 1.5 tablets twice a day 90 tablet 6  . losartan (COZAAR) 100 MG tablet Take 1 tablet (100 mg total) by mouth daily. 90 tablet 3  . Multiple  Vitamin (MULTIVITAMIN) tablet Take 1 tablet by mouth daily.    [name] testosterone cypionate (DEPOTESTOSTERONE CYPIONATE) 200 MG/ML injection Inject 100 mg into the muscle once a week.     . flecainide (TAMBOCOR) 50 MG tablet Take 1 tablet (50 mg total) by mouth 2 (two) times daily. 180 tablet 3   No current facility-administered medications for this visit.    Social History   Social History  . Marital Status: Married    Spouse Name: N/A  . Number of Children: 2  .  Years of Education: N/A   Occupational History  . retired    Social History Main Topics  . Smoking status: Former Smoker    Start date: 11/20/1976  . Smokeless tobacco: Never Used     Comment: quit smoking 1986 ish  . Alcohol Use: No     Comment: occ   . Drug Use: No  . Sexual Activity: Not on file   Other Topics Concern  . Not on file   Social History Narrative   Additional social history is notable in that he is married for 41 years.  He has 2 children and 2 grandchildren.  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  He completed 12th grade of education.  Remotely he had smoked but quit in 1977.  He drinks 3 beers per day.  He has been exercising fairly regularly at least 4 days per week including weightlifting and cardio for proximally 2 hours per session.  Family History  Problem Relation Age of Onset  . Colon cancer Neg Hx   . Stomach cancer Neg Hx   . Heart disease Mother    Family history is notable that his mother died at age 57 with heart disease and had atrial fibrillation and congestive heart failure.  Father died at 17 with a brain tumor.  He has 2 sisters and both have hypertension.   ROS General: Negative; No fevers, chills, or night sweats HEENT: Negative; No changes in vision or hearing, sinus congestion, difficulty swallowing Pulmonary: Negative; No cough, wheezing, shortness of breath, hemoptysis Cardiovascular:  See HPI;  GI: Negative; No nausea, vomiting, diarrhea,  or abdominal pain GU: Negative; No dysuria, hematuria, or difficulty voiding Musculoskeletal: Negative; no myalgias, joint pain, or weakness Hematologic/Oncologic: Negative; no easy bruising, bleeding Endocrine: Negative; no heat/cold intolerance; no diabetes Neuro: Negative; no changes in balance, headaches Skin: Negative; No rashes or skin lesions Psychiatric: Negative; No behavioral problems, depression Sleep: Positive for previously loud snoring, frequent awakenings, nonrestorative sleep, and daytime sleepiness, hypersomnolence, now on CPAP therapy with resolution of symptoms; no bruxism, restless legs, hypnogagnic hallucinations Other comprehensive 14 point system review is negative   Physical Exam BP 130/70 mmHg  Pulse 58  Ht 6' (1.829 m)  Wt 213 lb 6.4 oz (96.798 kg)  BMI 28.94 kg/m2  Repeat blood pressure by me 150/78  Wt Readings from Last 3 Encounters:  02/17/15 213 lb 6.4 oz (96.798 kg)  02/14/15 210 lb (95.255 kg)  01/25/15 214 lb (97.07 kg)   General: Alert, oriented, no distress.  Skin: normal turgor, no rashes, warm and dry HEENT: Normocephalic, atraumatic. Pupils equal round and reactive to light; sclera anicteric; extraocular muscles intact; Fundi mild arterial narrowing Nose without nasal septal hypertrophy Mouth/Parynx benign; Mallinpatti scale 3 Neck: No JVD, no carotid bruits; normal carotid upstroke Lungs: clear to ausculatation and percussion; no wheezing or rales Chest wall: without tenderness to palpitation Heart: PMI not displaced, irregularly irregular rhythm with a ventricular rate in the 50's, s1 s2 normal, 1/6 systolic murmur, no diastolic murmur, no rubs, gallops, thrills, or heaves Abdomen: soft, nontender; no hepatosplenomehaly, BS+; abdominal aorta nontender and not dilated by palpation. Back: no CVA tenderness Pulses 2+ Musculoskeletal: full range of motion, normal strength, no joint deformities Extremities: no clubbing cyanosis or edema,  Homan's sign negative  Neurologic: grossly nonfocal; Cranial nerves grossly wnl Psychologic: Normal mood and affect  ECG (independently read by me): Atrial fibrillation at 58 bpm.  Nonspecific ST changes.  QTc interval 380  ms.  01/12/2015 ECG  (independently read by me):  Normal sinus rhythm at 62 bpm. Nonspecific ST-T changes.  September 2016 ECG (independently read by me): Atrial fibrillation with a slow ventricular response in the 50s.  QTc interval 364 ms.  Prior CG (independently read by me):  Atrial fibrillation with ventricular rate at 52 bpm.  No significant ST-T change.  09/27/2014 ECG (independently read by me): Atrial fibrillation with ventricular rate at 59 bpm.  Nonspecific ST-T changes.  LABS:  BMP Latest Ref Rng 02/17/2015 01/25/2015 11/24/2014  Glucose 65 - 99 mg/dL 75 102(H) 85  BUN 7 - 25 mg/dL '24 17 13  ' Creatinine 0.70 - 1.18 mg/dL 1.37(H) 1.16 1.10  Sodium 135 - 146 mmol/L 138 136 137  Potassium 3.5 - 5.3 mmol/L 4.6 4.6 4.3  Chloride 98 - 110 mmol/L 102 106 98  CO2 20 - 31 mmol/L '26 23 28  ' Calcium 8.6 - 10.3 mg/dL 9.8 9.4 9.5     Hepatic Function Latest Ref Rng 02/17/2015 10/21/2014 09/27/2014  Total Protein 6.1 - 8.1 g/dL 6.8 6.8 6.4  Albumin 3.6 - 5.1 g/dL 4.1 4.0 4.0  AST 10 - 35 U/L 20 31 35  ALT 9 - 46 U/L 19 42 68(H)  Alk Phosphatase 40 - 115 U/L 67 81 77  Total Bilirubin 0.2 - 1.2 mg/dL 0.9 1.1 1.2    CBC Latest Ref Rng 02/17/2015 01/25/2015 11/24/2014  WBC 4.0 - 10.5 K/uL 8.9 5.5 6.0  Hemoglobin 13.0 - 17.0 g/dL 16.0 16.0 15.9  Hematocrit 39.0 - 52.0 % 45.3 45.8 45.1  Platelets 150 - 400 K/uL 208 175 169   Lab Results  Component Value Date   MCV 89.2 02/17/2015   MCV 90.0 01/25/2015   MCV 91.1 11/24/2014   Lab Results  Component Value Date   TSH 2.626 02/17/2015   Lab Results  Component Value Date   HGBA1C 4.9 05/06/2006     BNP No results found for: BNP  ProBNP No results found for: PROBNP   Lipid Panel     Component Value  Date/Time   CHOL 160 10/21/2014 1055   TRIG 119 10/21/2014 1055   HDL 41 10/21/2014 1055   CHOLHDL 3.9 10/21/2014 1055   VLDL 24 10/21/2014 1055   LDLCALC 95 10/21/2014 1055    RADIOLOGY: No results found.   ASSESSMENT AND PLAN: Mr. Sahil Milner is a 75 year old male who was found to be in atrial fibrillation in August 2016.  At that time, he  was started on anticoagulation.  He has been on beta blocker therapy and was started on ARB therapy with losartan.  Due to high suspicion for sleep apnea, a subsequent sleep study revealed mild to moderate sleep apnea overall, but moderate sleep apnea during supine sleep with an AHI of 22.8 per hour.  He feels significantly improved since initiating CPAP therapy.  He had undergone successful cardioversion several weeks ago after holding eloquence underwent successful biopsy of his laryngeal nodule which proved negative for malignancy.  He's been back on eliquis.  Several days ago he had a prolonged sneezing spell which may precipitated recurrent atrial fibrillation.  His ECG confirms a half with a ventricular rate at 58 and a normal QT interval.  With his fairly prompt recurrence of AF and his continued CPAP therapy, I have recommended initiation of antiarrhythmic therapy.  He has normal LV function.  I'm electing to start him on flecanide 50 mg twice a day.  He will reduce his current dose  of atenolol from 75 mg twice a day to 50 mg twice a day for the next 2 days and then he will reduce this to 25 mg twice a day.  If his resting pulse gets below 54.  He will hold his atenolol and discontinue it altogether.  I have recommended blood work be obtained today.  I will see him back in the office next week to follow-up initiation of flecainide.  If he does not pharmacologically cardiovert repeat cardioversion will be planned in several weeks.  Time spent: 25 minutes  Troy Sine, MD, [facility] 02/18/2015 11:57 AM

## 2015-02-23 ENCOUNTER — Encounter: Payer: Self-pay | Admitting: Cardiovascular Disease

## 2015-02-23 ENCOUNTER — Ambulatory Visit (INDEPENDENT_AMBULATORY_CARE_PROVIDER_SITE_OTHER): Payer: Medicare HMO | Admitting: Cardiovascular Disease

## 2015-02-23 VITALS — BP 141/78 | HR 58 | Ht 72.0 in | Wt 212.6 lb

## 2015-02-23 DIAGNOSIS — I10 Essential (primary) hypertension: Secondary | ICD-10-CM

## 2015-02-23 DIAGNOSIS — Z7901 Long term (current) use of anticoagulants: Secondary | ICD-10-CM | POA: Diagnosis not present

## 2015-02-23 DIAGNOSIS — G4733 Obstructive sleep apnea (adult) (pediatric): Secondary | ICD-10-CM | POA: Diagnosis not present

## 2015-02-23 DIAGNOSIS — Z9989 Dependence on other enabling machines and devices: Secondary | ICD-10-CM

## 2015-02-23 DIAGNOSIS — I480 Paroxysmal atrial fibrillation: Secondary | ICD-10-CM

## 2015-02-23 MED ORDER — AMLODIPINE BESYLATE 5 MG PO TABS: 7.5000 mg | ORAL_TABLET | Freq: Every day | ORAL | Status: DC

## 2015-02-23 NOTE — Patient Instructions (Signed)
Your physician recommends that you schedule a follow-up appointment in: 3 months with Dr Claiborne Billings.

## 2015-02-23 NOTE — Progress Notes (Signed)
Patient ID: Kendon Sedeno, male   DOB: 1940-08-21, 75 y.o.   MRN: [id number]     Primary M.D.: Dr. Mallie Mussel   HPI:  Andreu Drudge is  Zymier Rodgers is a 75 y.o. male  Has a history of PAF, and recent diagnosis of obstructive sleep apnea.  He is found to have recurrent atrial fibrillation when seen one week ago.  He presents for follow-up cardiology and sleep evaluation.  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  He underwent endoscopy by Dr. Erskine Emery and was noted to have an irregular heart rhythm with  sinus rhythm with PVCs and PACs when connected to the monitor.  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  He had undergone a cardiac catheterization in approximately 1985.  Recently, he has noticed chest tightness when working out and also has noticed significant increasing shortness of breath.  He has been aware of a somewhat irregular rhythm.  Over several months.  In July he was recently treated with diverticular lightness with Cipro and Flagyl and had noticed increased palpitations at that time.  When I saw for initial evaluation on 09/27/2014 he was in atrial fibrillation which he was completely unaware of.  On further questioning, he omitted that he snores very loudly and has had significant episodes of witnessed apnea.  He can fall asleep during the day at any time if circumstances permit.  He has a history of hypertension.  Recently he had been taking atenolol 150 mg in the morning had taken testosterone injections once a week.  I changed his atenolol to 100 mg in the morning and 50 mg grams at night.  He was significantly hypertensive and I added amlodipine 5 mg to his medical regimen.  I scheduled him for a nuclear perfusion study which was done in 10/07/2014.  There were no ECG changes.  There was a small  defect of mild severity in the basal inferior wall.  It was felt most likely this was diaphragmatic attenuation.  Ejection fraction was 59%.  An echo Doppler study revealed an EF of 50-55% and raise the possibility of mild posterior basal hypokinesis.  He had mild AR and mild MR.  He was started on eliquis at his office visit.  On follow-up evaluation, his blood pressure was still elevated.  I increased his amlodipine to 7.5 mg in the morning and also started him on losartan 50 mg daily.  He was scheduled for sleep study, which had not yet been done.  He underwent a sleep study which confirm my suspicion for obstructive sleep apnea.  Overall sleep apnea was mild with an AHI of 12.3 , but events were moderate with supine sleep with an AHI of 22.8 per hour. He has significant oxygen desaturation to a nadir of 81% and on the initial study had reduced sleep efficiency at 61.8% and evidence for loud snoring.  He subsequently underwent a CPAP titration trial and he was titrated up to 8 cm water pressure with excellent result.  He underwent successful cardioversion for his atrial fibrillation  On 12/01/2014. He admits to one brief episode following the cardioversion while sleeping before CPAP therapy was initiated and this had reverted back to normal rhythm in the morning when he awakened  CPAP was initiated on November 14, 2014.  He has a ResMed Airfit P10 nasalpillow mask, medium size. A download was reviewed from 12/12/2014 through 01/10/2015. His compliance is excellent at 100% of usage.  He is averaging 8 hours and 24 minutes.  His AHI was still mildly elevated at 6.5, but significant improved initially.  There is no significant leak.  Since I last saw him, he had undergone biopsy of his laryngeal nodule, which was negative.  When I saw him one week ago, he complained of several days of being back in atrial fibrillation which seem to be precipitated after prolonged sneezing spell.  When I saw him, with his  recurrent AF and normal LV function.  I recommended initiation of flecanideat 50 mg twice a day.  He was advised to reduce his atenolol from 75 g twice a day to 50 mg twice a day for 2 days and on the third day to reduce his atenolol to 25 g twice a day.  After 2 doses of flecanide he felt that his heart rhythm had again stabilized.  A download was obtained from December 10 3 2016 through 02/19/2015.  He is using CPAP with 100% compliance.  The average 9 hours of sleep per night.  He is set at an auto mode and his 95th percentile pressure was 13.7 with a maximum 16.9.  AHI was 7.9.  Of note, one week ago when I saw the patient.  We discussed turning off his ramp since at the beginning of his sleep.  He was having some difficulty getting adequate air. I rechecked a download for the past week when the has been off, AHI was slightly improved at 5.8/hr. .  He feels well.  He has more energy.  He is exercising regularly.  He believes his sleep is good quality.  He denies residual daytime sleepiness.  Past Medical History  Diagnosis Date  . Hypertension   . Seasonal allergies   . Arthritis     neck  . Tubular adenoma of colon 2013    colonoscpy in 5 years [name]  . Dysrhythmia     afib - cardioverted 01/2015  . Neuromuscular disorder ([place])   . History of kidney stones   . Pneumonia   . Shortness of breath dyspnea     with exertion  . Melanoma ([place]) 2000    back; stage 3, squamous 1 - basal- several  . Phlebitis     Past Surgical History  Procedure Laterality Date  . Cataract extraction  2002, 2004    bilateral  . Melanoma removal  2000    back  . Tendon repair Right 2012    right  . Elbow surgery Left 1984    tendon repair left arm  . Tonsillectomy    . Cardioversion N/A 12/01/2014    Procedure: CARDIOVERSION;  Surgeon: Troy Sine, MD;  Location: [facility];  Service: Cardiovascular;  Laterality: N/A;  . Upper gi endoscopy    . Cystoscopy    . Lithotripsy    .  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 Lisinopril     REACTION: severe GI upset requiring EGD--temporally related to lisinopril--tolerated micardis    Current Outpatient Prescriptions  Medication Sig Dispense Refill  . amLODipine (NORVASC) 5 MG tablet Take 1.5  tablets (7.5 mg total) by mouth daily. 135 tablet 3  . apixaban (ELIQUIS) 5 MG TABS tablet Take 1 tablet (5 mg total) by mouth 2 (two) times daily. 60 tablet 6  . atenolol (TENORMIN) 50 MG tablet Take 1.5 tablets twice a day 90 tablet 6  . flecainide (TAMBOCOR) 50 MG tablet Take 1 tablet (50 mg total) by mouth 2 (two) times daily. 180 tablet 3  . GLUCOSAMINE-CHONDROITIN PO Take 2 tablets by mouth daily.    [name] losartan (COZAAR) 100 MG tablet Take 1 tablet (100 mg total) by mouth daily. 90 tablet 3  . Multiple Vitamin (MULTIVITAMIN) tablet Take 1 tablet by mouth daily.    . Saw Palmetto, Serenoa repens, (SAW PALMETTO PO) Take 1 tablet by mouth daily.    [name] testosterone cypionate (DEPOTESTOSTERONE CYPIONATE) 200 MG/ML injection Inject 100 mg into the muscle once a week.      No current facility-administered medications for this visit.    Social History   Social History  . Marital Status: Married    Spouse Name: N/A  . Number of Children: 2  . Years of Education: N/A   Occupational History  . retired    Social History Main Topics  . Smoking status: Former Smoker    Start date: 11/20/1976  . Smokeless tobacco: Never Used     Comment: quit smoking 1986 ish  . Alcohol Use: No     Comment: occ   . Drug Use: No  . Sexual Activity: Not on file   Other Topics Concern  . Not on file   Social History Narrative   Additional social history is notable in that he is married for 41 years.  He  has 2 children and 2 grandchildren.  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  He completed 12th grade of education.  Remotely he had smoked but quit in 1977.  He drinks 3 beers per day.  He has been exercising fairly regularly at least 4 days per week including weightlifting and cardio for proximally 2 hours per session.  Family History  Problem Relation Age of Onset  . Colon cancer Neg Hx   . Stomach cancer Neg Hx   . Heart disease Mother    Family history is notable that his mother died at age 58 with heart disease and had atrial fibrillation and congestive heart failure.  Father died at 2 with a brain tumor.  He has 2 sisters and both have hypertension.   ROS General: Negative; No fevers, chills, or night sweats HEENT: Negative; No changes in vision or hearing, sinus congestion, difficulty swallowing Pulmonary: Negative; No cough, wheezing, shortness of breath, hemoptysis Cardiovascular:  See HPI;  GI: Negative; No nausea, vomiting, diarrhea, or abdominal pain GU: Negative; No dysuria, hematuria, or difficulty voiding Musculoskeletal: Negative; no myalgias, joint pain, or weakness Hematologic/Oncologic: Negative; no easy bruising, bleeding Endocrine: Negative; no heat/cold intolerance; no diabetes Neuro: Negative; no changes in balance, headaches Skin: Negative; No rashes or skin lesions Psychiatric: Negative; No behavioral problems, depression Sleep: Positive for previously loud snoring, frequent awakenings, nonrestorative sleep, and daytime sleepiness, hypersomnolence, now on CPAP therapy with resolution of symptoms; no bruxism, restless legs, hypnogagnic hallucinations Other comprehensive 14 point system review is negative   Physical Exam BP 141/78 mmHg  Pulse 58  Ht 6' (1.829 m)  Wt 212 lb 9.6 oz (96.435 kg)  BMI 28.83 kg/m2  Repeat blood pressure by me 140/76  Wt Readings from Last 3 Encounters:  02/23/15 212 lb 9.6 oz (96.435 kg)  02/17/15  213 lb 6.4 oz (96.798 kg)  02/14/15 210 lb (95.255 kg)   General: Alert, oriented, no distress.  Skin: normal turgor, no rashes, warm and dry HEENT: Normocephalic, atraumatic. Pupils equal round and reactive to light; sclera anicteric; extraocular muscles intact; Fundi mild arterial narrowing Nose without nasal septal hypertrophy Mouth/Parynx benign; Mallinpatti scale 3 Neck: No JVD, no carotid bruits; normal carotid upstroke Lungs: clear to ausculatation and percussion; no wheezing or rales Chest wall: without tenderness to palpitation Heart: PMI not displaced, rhythm is now regular with a ventricular rate in the 50's, s1 s2 normal, 1/6 systolic murmur, no diastolic murmur, no rubs, gallops, thrills, or heaves Abdomen: soft, nontender; no hepatosplenomehaly, BS+; abdominal aorta nontender and not dilated by palpation. Back: no CVA tenderness Pulses 2+ Musculoskeletal: full range of motion, normal strength, no joint deformities Extremities: no clubbing cyanosis or edema, Homan's sign negative  Neurologic: grossly nonfocal; Cranial nerves grossly wnl Psychologic: Normal mood and affect  ECG (independently read by me): Sinus bradycardia 59 bpm.  QTc interval 390 ms.  Nonspecific T changes.  02/17/15 ECG (independently read by me): Atrial fibrillation at 58 bpm.  Nonspecific ST changes.  QTc interval 380 ms.  01/12/2015 ECG  (independently read by me):  Normal sinus rhythm at 62 bpm. Nonspecific ST-T changes.  September 2016 ECG (independently read by me): Atrial fibrillation with a slow ventricular response in the 50s.  QTc interval 364 ms.  Prior CG (independently read by me):  Atrial fibrillation with ventricular rate at 52 bpm.  No significant ST-T change.  09/27/2014 ECG (independently read by me): Atrial fibrillation with ventricular rate at 59 bpm.  Nonspecific ST-T changes.  LABS:  BMP Latest Ref Rng 02/17/2015 01/25/2015 11/24/2014  Glucose 65 - 99 mg/dL 75 102(H) 85  BUN 7 - 25  mg/dL '24 17 13  ' Creatinine 0.70 - 1.18 mg/dL 1.37(H) 1.16 1.10  Sodium 135 - 146 mmol/L 138 136 137  Potassium 3.5 - 5.3 mmol/L 4.6 4.6 4.3  Chloride 98 - 110 mmol/L 102 106 98  CO2 20 - 31 mmol/L '26 23 28  ' Calcium 8.6 - 10.3 mg/dL 9.8 9.4 9.5     Hepatic Function Latest Ref Rng 02/17/2015 10/21/2014 09/27/2014  Total Protein 6.1 - 8.1 g/dL 6.8 6.8 6.4  Albumin 3.6 - 5.1 g/dL 4.1 4.0 4.0  AST 10 - 35 U/L 20 31 35  ALT 9 - 46 U/L 19 42 68(H)  Alk Phosphatase 40 - 115 U/L 67 81 77  Total Bilirubin 0.2 - 1.2 mg/dL 0.9 1.1 1.2    CBC Latest Ref Rng 02/17/2015 01/25/2015 11/24/2014  WBC 4.0 - 10.5 K/uL 8.9 5.5 6.0  Hemoglobin 13.0 - 17.0 g/dL 16.0 16.0 15.9  Hematocrit 39.0 - 52.0 % 45.3 45.8 45.1  Platelets 150 - 400 K/uL 208 175 169   Lab Results  Component Value Date   MCV 89.2 02/17/2015   MCV 90.0 01/25/2015   MCV 91.1 11/24/2014   Lab Results  Component Value Date   TSH 2.626 02/17/2015   Lab Results  Component Value Date   HGBA1C 4.9 05/06/2006     BNP No results found for: BNP  ProBNP No results found for: PROBNP   Lipid Panel     Component Value Date/Time   CHOL 160 10/21/2014 1055   TRIG 119 10/21/2014 1055   HDL 41 10/21/2014 1055   CHOLHDL 3.9 10/21/2014 1055   VLDL 24 10/21/2014 1055  [place] 95 10/21/2014 1055    RADIOLOGY: No results found.   ASSESSMENT AND PLAN: Mr. Davis Ambrosini is a 75 year old male who was found to be in atrial fibrillation in August 2016 and was started on anticoagulation.  He has been on beta blocker therapy and was started on ARB therapy with losartan.  He was found to have mild to moderate sleep apnea overall, but moderate sleep apnea during supine sleep with an AHI of 22.8 per hour.  He feels significantly improved since initiating CPAP therapy.  He had undergone successful cardioversion on 12/01/2014 and in December after holding eliquis underwent successful biopsy of his laryngeal nodule which proved negative for  malignancy.  When I saw him last week, he was back in atrial fibrillation for several days duration which was precipitated by prolonged sneezing spell.  He was started on flecanide  50 mg twice a day, and his atenolol dose was reduced such that now he is just on 25 mg twice a day.  His ECG today confirms restoration of sinus rhythm with sinus bradycardia 59 bpm.  QTc interval is normal.  He feels significantly improved.  He admits to sleeping great.  He is sleeping for over 9 hours per night.  He has discontinued his infrequent EtOH use.  I also reviewed his CPAP downloads, both for the past month and also for the past week.  His downloads have improved with discontinuance of his ramp time.  I also reviewed recent blood work done last week.  His blood pressure today is controlled on losartan 100 mg, amlodipine 7.5 mg and his reduced dose of atenolol.  I will see him in 3 months for cardiology reevaluation.  Time spent: 25 minutes  Troy Sine, MD, [facility] 02/23/2015 1:16 PM

## 2015-03-09 ENCOUNTER — Ambulatory Visit: Payer: Medicare HMO | Admitting: Cardiovascular Disease

## 2015-03-10 ENCOUNTER — Encounter: Payer: Self-pay | Admitting: *Deleted

## 2015-03-13 DIAGNOSIS — J384 Edema of larynx: Secondary | ICD-10-CM | POA: Diagnosis not present

## 2015-03-13 DIAGNOSIS — Z09 Encounter for follow-up examination after completed treatment for conditions other than malignant neoplasm: Secondary | ICD-10-CM | POA: Diagnosis not present

## 2015-03-13 DIAGNOSIS — Z87898 Personal history of other specified conditions: Secondary | ICD-10-CM | POA: Diagnosis not present

## 2015-03-14 DIAGNOSIS — G4733 Obstructive sleep apnea (adult) (pediatric): Secondary | ICD-10-CM | POA: Diagnosis not present

## 2015-03-16 DIAGNOSIS — E291 Testicular hypofunction: Secondary | ICD-10-CM | POA: Diagnosis not present

## 2015-03-16 DIAGNOSIS — N401 Enlarged prostate with lower urinary tract symptoms: Secondary | ICD-10-CM | POA: Diagnosis not present

## 2015-03-24 DIAGNOSIS — N401 Enlarged prostate with lower urinary tract symptoms: Secondary | ICD-10-CM | POA: Diagnosis not present

## 2015-03-24 DIAGNOSIS — R351 Nocturia: Secondary | ICD-10-CM | POA: Diagnosis not present

## 2015-03-24 DIAGNOSIS — N138 Other obstructive and reflux uropathy: Secondary | ICD-10-CM | POA: Diagnosis not present

## 2015-03-24 DIAGNOSIS — R972 Elevated prostate specific antigen [PSA]: Secondary | ICD-10-CM | POA: Diagnosis not present

## 2015-03-24 DIAGNOSIS — Z0000 Encounter for general adult medical examination without abnormal findings: Secondary | ICD-10-CM | POA: Diagnosis not present

## 2015-03-24 DIAGNOSIS — E291 Testicular hypofunction: Secondary | ICD-10-CM | POA: Diagnosis not present

## 2015-03-29 DIAGNOSIS — G4733 Obstructive sleep apnea (adult) (pediatric): Secondary | ICD-10-CM | POA: Diagnosis not present

## 2015-04-06 ENCOUNTER — Telehealth: Payer: Self-pay | Admitting: *Deleted

## 2015-04-06 NOTE — Telephone Encounter (Signed)
Called patient to offer flu vaccine Left message with patient's wifel to return call. Velora Heckler, RN

## 2015-04-11 ENCOUNTER — Ambulatory Visit (INDEPENDENT_AMBULATORY_CARE_PROVIDER_SITE_OTHER): Payer: Medicare HMO | Admitting: *Deleted

## 2015-04-11 DIAGNOSIS — Z23 Encounter for immunization: Secondary | ICD-10-CM

## 2015-04-11 DIAGNOSIS — G4733 Obstructive sleep apnea (adult) (pediatric): Secondary | ICD-10-CM | POA: Diagnosis not present

## 2015-05-12 DIAGNOSIS — G4733 Obstructive sleep apnea (adult) (pediatric): Secondary | ICD-10-CM | POA: Diagnosis not present

## 2015-05-16 ENCOUNTER — Encounter: Payer: Self-pay | Admitting: Cardiovascular Disease

## 2015-05-16 ENCOUNTER — Ambulatory Visit (INDEPENDENT_AMBULATORY_CARE_PROVIDER_SITE_OTHER): Payer: Medicare HMO | Admitting: Cardiovascular Disease

## 2015-05-16 VITALS — BP 152/84 | HR 59 | Ht 72.0 in | Wt 213.8 lb

## 2015-05-16 DIAGNOSIS — G4733 Obstructive sleep apnea (adult) (pediatric): Secondary | ICD-10-CM

## 2015-05-16 DIAGNOSIS — I480 Paroxysmal atrial fibrillation: Secondary | ICD-10-CM

## 2015-05-16 DIAGNOSIS — I10 Essential (primary) hypertension: Secondary | ICD-10-CM

## 2015-05-16 DIAGNOSIS — Z7901 Long term (current) use of anticoagulants: Secondary | ICD-10-CM | POA: Diagnosis not present

## 2015-05-16 DIAGNOSIS — Z9989 Dependence on other enabling machines and devices: Secondary | ICD-10-CM

## 2015-05-16 MED ORDER — FLECAINIDE ACETATE 50 MG PO TABS: 75.0000 mg | ORAL_TABLET | Freq: Two times a day (BID) | ORAL | Status: DC

## 2015-05-16 NOTE — Patient Instructions (Signed)
Increase Flecainide 50 mg 1&1/2 tablets twice a day   Schedule Treadmill Test   Your physician wants you to follow-up in: 3 months with Dr.Kelly. You will receive a reminder letter in the mail two months in advance. If you don't receive a letter, please call our office to schedule the follow-up appointment.

## 2015-05-17 ENCOUNTER — Encounter: Payer: Self-pay | Admitting: Cardiovascular Disease

## 2015-05-17 NOTE — Progress Notes (Signed)
Patient ID: Mario [name], male   DOB: 1940-03-05, 75 y.o.   MRN: [id number]     Primary M.D.: Dr. Mallie Mussel   HPI:  Mario [name] is  Mario [name] is a 75 y.o. male who has a history of PAF, and recent diagnosis of obstructive sleep apnea.  He developed recurrent atrial fibrillation and was started on flecanide.  He presents for follow-up evaluation.  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  He underwent endoscopy by Dr. Erskine Emery and was noted to have an irregular heart rhythm with  sinus rhythm with PVCs and PACs when connected to the monitor.  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  He had undergone a cardiac catheterization in approximately 1985.  Las t year  noticed chest tightness when working out and also had noticed significant increasing shortness of breath.  He has been aware of a somewhat irregular rhythm.  Over several months.  In July he was recently treated with diverticulitis with Cipro and Flagyl and had noticed increased palpitations at that time.  When I saw for initial evaluation on 09/27/2014 he was in atrial fibrillation which he was completely unaware of.  On further questioning, he omitted that he snores very loudly and has had significant episodes of witnessed apnea.  He can fall asleep during the day at any time if circumstances permit.  He has a history of hypertension.  Recently he had been taking atenolol 150 mg in the morning had taken testosterone injections once a week.  I changed his atenolol to 100 mg in the morning and 50 mg grams at night.  He was significantly hypertensive and I added amlodipine 5 mg to his medical regimen.  I scheduled him for a nuclear perfusion study which was done in 10/07/2014.  There were no ECG changes.  There was a small defect of mild severity in the basal  inferior wall.  It was felt most likely this was diaphragmatic attenuation.  Ejection fraction was 59%.  An echo Doppler study revealed an EF of 50-55% and raise the possibility of mild posterior basal hypokinesis.  He had mild AR and mild MR.  He was started on eliquis at his office visit.  On follow-up evaluation, his blood pressure was still elevated.  I increased his amlodipine to 7.5 mg in the morning and also started him on losartan 50 mg daily.  He underwent a sleep study which confirm my suspicion for obstructive sleep apnea.  Overall sleep apnea was mild with an AHI of 12.3 , but events were moderate with supine sleep with an AHI of 22.8 per hour. He has significant oxygen desaturation to a nadir of 81% and on the initial study had reduced sleep efficiency at 61.8% and evidence for loud snoring.  He subsequently underwent a CPAP titration trial and he was titrated up to 8 cm water pressure with excellent result.  He underwent successful cardioversion for his atrial fibrillation  On 12/01/2014. He admits to one brief episode following the cardioversion while sleeping before CPAP therapy was initiated and this had reverted back to normal rhythm in the morning when he awakened  CPAP was initiated on November 14, 2014. He has a ResMed Airfit P10 nasalpillow mask, medium size. A download was reviewed from 12/12/2014 through 01/10/2015. His  compliance is excellent at 100% of usage.  He is averaging 8 hours and 24 minutes.  His AHI was still mildly elevated at 6.5, but significant improved initially.  There is no significant leak.  Since I last saw him, he had undergone biopsy of his laryngeal nodule, which was negative.  When I saw him in January 2017, he complained of several days of being back in atrial fibrillation which seem to be precipitated after prolonged sneezing spell.  When I saw him, with his recurrent AF and normal LV function.  I recommended initiation of flecanide at 50 mg twice a day.  He  was advised to reduce his atenolol from 75 g twice a day to 50 mg twice a day for 2 days and on the third day to reduce his atenolol to 25 g twice a day.  After 2 doses of flecanide he felt that his heart rhythm had again stabilized.  A CPAP download  from January 24, 2015 through 02/19/2015 demonstrated 100% compliance.  He averaged 9 hours of sleep per night.  He is set at an auto mode and his 95th percentile pressure was 13.7 with a maximum 16.9.  AHI was 7.9.  Of note, one week ago when I saw the patient.  We discussed turning off his ramp since at the beginning of his sleep.  He was having some difficulty getting adequate air. I rechecked a download for the past week when the has been off, AHI was slightly improved at 5.8/hr.  He feels well.  He has more energy.  He is exercising regularly.  He believes his sleep is good quality.  He denies residual daytime sleepiness.  Since I last saw him, he states that he is been doing exceptionally well.  He has been working out very hard.  He has been under increased mental stress over the past 2-3 weeks.  2 days ago.  He states that he believes he went back into atrial fibrillation for approximately 12 hours, but then ultimately his rhythm stabilized.  He presents for follow-up evaluation.  Past Medical History  Diagnosis Date  . Hypertension   . Seasonal allergies   . Arthritis     neck  . Tubular adenoma of colon 2013    colonoscpy in 5 years [name]  . Dysrhythmia     afib - cardioverted 01/2015  . Neuromuscular disorder ([place])   . History of kidney stones   . Pneumonia   . Shortness of breath dyspnea     with exertion  . Melanoma ([place]) 2000    back; stage 3, squamous 1 - basal- several  . Phlebitis     Past Surgical History  Procedure Laterality Date  . Cataract extraction  2002, 2004    bilateral  . Melanoma removal  2000    back  . Tendon repair Right 2012    right  . Elbow surgery Left 1984    tendon repair left arm  .  Tonsillectomy    . Cardioversion N/A 12/01/2014    Procedure: CARDIOVERSION;  Surgeon: Troy Sine, MD;  Location: [facility];  Service: Cardiovascular;  Laterality: N/A;  . Upper gi endoscopy    . Cystoscopy    . Lithotripsy    . 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 Lisinopril     REACTION: severe GI upset requiring EGD--temporally related to lisinopril--tolerated micardis    Current Outpatient Prescriptions  Medication Sig Dispense Refill  . amLODipine (NORVASC) 5 MG tablet Take 1.5 tablets (7.5 mg total) by mouth daily. 135 tablet 3  . apixaban (ELIQUIS) 5 MG TABS tablet Take 1 tablet (5 mg total) by mouth 2 (two) times daily. 60 tablet 6  . atenolol (TENORMIN) 25 MG tablet Take 25 mg by mouth 2 (two) times daily.    . flecainide (TAMBOCOR) 50 MG tablet Take 1.5 tablets (75 mg total) by mouth 2 (two) times daily. 180 tablet 6  . GLUCOSAMINE-CHONDROITIN PO Take 2 tablets by mouth daily.    [name] losartan (COZAAR) 100 MG tablet Take 1 tablet (100 mg total) by mouth daily. 90 tablet 3  . Multiple Vitamin (MULTIVITAMIN) tablet Take 1 tablet by mouth daily.    . Saw Palmetto, Serenoa repens, (SAW PALMETTO PO) Take 1 tablet by mouth daily.    [name] testosterone cypionate (DEPOTESTOSTERONE CYPIONATE) 200 MG/ML injection Inject 100 mg into the muscle once a week.      No current facility-administered medications for this visit.    Social History   Social History  . Marital Status: Married    Spouse Name: N/A  . Number of Children: 2  . Years of Education: N/A   Occupational History  . retired    Social History Main Topics  . Smoking status: Former Smoker    Start date: 11/20/1976  . Smokeless tobacco: Never Used     Comment: quit smoking 1986  ish  . Alcohol Use: No     Comment: occ   . Drug Use: No  . Sexual Activity: Not on file   Other Topics Concern  . Not on file   Social History Narrative   Additional social history is notable in that he is married for 41 years.  He has 2 children and 2 grandchildren.  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  He completed 12th grade of education.  Remotely he had smoked but quit in 1977.  He drinks 3 beers per day.  He has been exercising fairly regularly at least 4 days per week including weightlifting and cardio for proximally 2 hours per session.  Family History  Problem Relation Age of Onset  . Colon cancer Neg Hx   . Stomach cancer Neg Hx   . Heart disease Mother    Family history is notable that his mother died at age 53 with heart disease and had atrial fibrillation and congestive heart failure.  Father died at 55 with a brain tumor.  He has 2 sisters and both have hypertension.   ROS General: Negative; No fevers, chills, or night sweats HEENT: Negative; No changes in vision or hearing, sinus congestion, difficulty swallowing Pulmonary: Negative; No cough, wheezing, shortness of breath, hemoptysis Cardiovascular:  See HPI;  GI: Negative; No nausea, vomiting, diarrhea, or abdominal pain GU: Negative; No dysuria, hematuria, or difficulty voiding Musculoskeletal: Negative; no myalgias, joint pain, or weakness Hematologic/Oncologic: Negative; no easy bruising, bleeding Endocrine: Negative; no heat/cold intolerance; no diabetes Neuro: Negative; no changes in balance, headaches Skin: Negative; No rashes or skin lesions Psychiatric: Negative; No behavioral problems, depression Sleep: Positive for previously loud snoring, frequent awakenings, nonrestorative sleep, and daytime sleepiness, hypersomnolence, now on CPAP therapy with resolution of symptoms; no bruxism, restless legs, hypnogagnic hallucinations Other comprehensive 14 point system review  is  negative   Physical Exam BP 152/84 mmHg  Pulse 59  Ht 6' (1.829 m)  Wt 213 lb 12.8 oz (96.979 kg)  BMI 28.99 kg/m2  Repeat blood pressure by me 140/80  Wt Readings from Last 3 Encounters:  05/16/15 213 lb 12.8 oz (96.979 kg)  02/23/15 212 lb 9.6 oz (96.435 kg)  02/17/15 213 lb 6.4 oz (96.798 kg)   General: Alert, oriented, no distress.  Skin: normal turgor, no rashes, warm and dry HEENT: Normocephalic, atraumatic. Pupils equal round and reactive to light; sclera anicteric; extraocular muscles intact; Fundi mild arterial narrowing Nose without nasal septal hypertrophy Mouth/Parynx benign; Mallinpatti scale 3 Neck: No JVD, no carotid bruits; normal carotid upstroke Lungs: clear to ausculatation and percussion; no wheezing or rales Chest wall: without tenderness to palpitation Heart: PMI not displaced, rhythm is now regular with a ventricular rate in the 50's, s1 s2 normal, 1/6 systolic murmur, no diastolic murmur, no rubs, gallops, thrills, or heaves Abdomen: soft, nontender; no hepatosplenomehaly, BS+; abdominal aorta nontender and not dilated by palpation. Back: no CVA tenderness Pulses 2+ Musculoskeletal: full range of motion, normal strength, no joint deformities Extremities: no clubbing cyanosis or edema, Homan's sign negative  Neurologic: grossly nonfocal; Cranial nerves grossly wnl Psychologic: Normal mood and affect  ECG (independently read by me): Sinus bradycardia 59 bpm.  QTc interval 376 ms.  No significant ST changes.  02/23/2015 ECG (independently read by me): Sinus bradycardia 59 bpm.  QTc interval 390 ms.  Nonspecific T changes.  02/17/15 ECG (independently read by me): Atrial fibrillation at 58 bpm.  Nonspecific ST changes.  QTc interval 380 ms.  01/12/2015 ECG  (independently read by me):  Normal sinus rhythm at 62 bpm. Nonspecific ST-T changes.  September 2016 ECG (independently read by me): Atrial fibrillation with a slow ventricular response in the 50s.  QTc  interval 364 ms.  Prior CG (independently read by me):  Atrial fibrillation with ventricular rate at 52 bpm.  No significant ST-T change.  09/27/2014 ECG (independently read by me): Atrial fibrillation with ventricular rate at 59 bpm.  Nonspecific ST-T changes.  LABS:  BMP Latest Ref Rng 02/17/2015 01/25/2015 11/24/2014  Glucose 65 - 99 mg/dL 75 102(H) 85  BUN 7 - 25 mg/dL '24 17 13  ' Creatinine 0.70 - 1.18 mg/dL 1.37(H) 1.16 1.10  Sodium 135 - 146 mmol/L 138 136 137  Potassium 3.5 - 5.3 mmol/L 4.6 4.6 4.3  Chloride 98 - 110 mmol/L 102 106 98  CO2 20 - 31 mmol/L '26 23 28  ' Calcium 8.6 - 10.3 mg/dL 9.8 9.4 9.5     Hepatic Function Latest Ref Rng 02/17/2015 10/21/2014 09/27/2014  Total Protein 6.1 - 8.1 g/dL 6.8 6.8 6.4  Albumin 3.6 - 5.1 g/dL 4.1 4.0 4.0  AST 10 - 35 U/L 20 31 35  ALT 9 - 46 U/L 19 42 68(H)  Alk Phosphatase 40 - 115 U/L 67 81 77  Total Bilirubin 0.2 - 1.2 mg/dL 0.9 1.1 1.2    CBC Latest Ref Rng 02/17/2015 01/25/2015 11/24/2014  WBC 4.0 - 10.5 K/uL 8.9 5.5 6.0  Hemoglobin 13.0 - 17.0 g/dL 16.0 16.0 15.9  Hematocrit 39.0 - 52.0 % 45.3 45.8 45.1  Platelets 150 - 400 K/uL 208 175 169   Lab Results  Component Value Date   MCV 89.2 02/17/2015   MCV 90.0 01/25/2015   MCV 91.1 11/24/2014   Lab Results  Component Value Date   TSH 2.626 02/17/2015   Lab Results  Component Value Date   HGBA1C 4.9 05/06/2006     BNP No results found for: BNP  ProBNP No results found for: PROBNP   Lipid Panel     Component Value Date/Time   CHOL 160 10/21/2014 1055   TRIG 119 10/21/2014 1055   HDL 41 10/21/2014 1055   CHOLHDL 3.9 10/21/2014 1055   VLDL 24 10/21/2014 1055   LDLCALC 95 10/21/2014 1055    RADIOLOGY: No results found.   ASSESSMENT AND PLAN: Mr. Artemis Loyal is a 75 year old male who was found to be in atrial fibrillation in August 2016 and was started on anticoagulation.  He has been on beta blocker therapy and was started on ARB therapy with losartan.  He  was found to have mild to moderate sleep apnea overall, but moderate sleep apnea during supine sleep with an AHI of 22.8 per hour.  He feels significantly improved since initiating CPAP therapy and continues to have 100% compliance.  He underwent initial successful cardioversion on 12/01/2014 and in December after holding eliquis underwent successful biopsy of his laryngeal nodule which proved negative for malignancy.  He develop recurrent atrial fibrillation in January but ultimately converted to sinus rhythm with institution of flecainide 50 mg bid.  He again had another episode of short-lived PAF lasting less than 12 hours 2 days ago despite taking atenolol 25 mg twice a day, and fllecanide 50 mg twice a day, losartan 100 mg daily and amlodipine 7.5 mg.  His blood pressure today is controlled on this regimen.  He is a muscular gentleman and weighs 213 pounds.  With his recurrent PAF, I have recommended slight additional titration of flecanide to 75 mg twice a day.  On this higher dose I will also schedule him for routine treadmill test to make certain there is no proarrhythmic effect, particularly since he does workout very aggressively and hard.  He is on Eliquis for anticoagulation.  Time spent: 25 minutes  Troy Sine, MD, [facility] 05/17/2015 4:48 PM

## 2015-06-06 ENCOUNTER — Telehealth (HOSPITAL_COMMUNITY): Payer: Self-pay

## 2015-06-06 NOTE — Telephone Encounter (Signed)
Encounter complete. 

## 2015-06-08 ENCOUNTER — Ambulatory Visit (HOSPITAL_COMMUNITY)
Admission: RE | Admit: 2015-06-08 | Discharge: 2015-06-08 | Disposition: A | Discharge status: Home / Self Care | Payer: Medicare HMO | Source: Ambulatory Visit | Attending: Cardiovascular Disease | Admitting: Cardiovascular Disease

## 2015-06-08 DIAGNOSIS — I480 Paroxysmal atrial fibrillation: Secondary | ICD-10-CM | POA: Insufficient documentation

## 2015-06-08 DIAGNOSIS — I10 Essential (primary) hypertension: Secondary | ICD-10-CM | POA: Diagnosis not present

## 2015-06-08 DIAGNOSIS — R9439 Abnormal result of other cardiovascular function study: Secondary | ICD-10-CM | POA: Insufficient documentation

## 2015-06-08 LAB — EXERCISE TOLERANCE TEST
CHL CUP RESTING HR STRESS: 75 {beats}/min
CHL RATE OF PERCEIVED EXERTION: 16
CSEPEDS: 10 s
CSEPEW: 11.9 METS
CSEPHR: 100 %
Exercise duration (min): 10 min
MPHR: 146 {beats}/min
Peak HR: 146 {beats}/min

## 2015-06-11 DIAGNOSIS — G4733 Obstructive sleep apnea (adult) (pediatric): Secondary | ICD-10-CM | POA: Diagnosis not present

## 2015-06-20 ENCOUNTER — Other Ambulatory Visit: Payer: Self-pay | Admitting: Cardiovascular Disease

## 2015-06-21 ENCOUNTER — Telehealth: Payer: Self-pay | Admitting: Cardiovascular Disease

## 2015-06-21 NOTE — Telephone Encounter (Signed)
[facility]  Pt following up on GXT results from April. Please call back and discuss.

## 2015-06-21 NOTE — Telephone Encounter (Signed)
Rx has been sent to the pharmacy electronically. ° °

## 2015-06-21 NOTE — Telephone Encounter (Signed)
Returned call to patient - he is unavailable - spoke with wife (DPR) Informed her that there is not a formal MD result note to report to them regarding GXT Informed her I would send a message to Dr. Claiborne Billings & Mariann Laster, CMA to review and advise  Per prelim report:   Study Highlights     Blood pressure demonstrated a hypotensive response to exercise.  Upsloping ST segment depression ST segment depression of 2 mm was noted during stress in the III, aVF, V5 and V6 leads, beginning at 10 minutes of stress, and returning to baseline after less than 1 minute of recovery.  Positive stress test concerning for ischemia.

## 2015-06-30 ENCOUNTER — Encounter: Payer: Self-pay | Admitting: Cardiovascular Disease

## 2015-06-30 ENCOUNTER — Ambulatory Visit (INDEPENDENT_AMBULATORY_CARE_PROVIDER_SITE_OTHER): Payer: Medicare HMO | Admitting: Cardiovascular Disease

## 2015-06-30 VITALS — BP 128/76 | HR 61 | Ht 72.0 in | Wt 214.6 lb

## 2015-06-30 DIAGNOSIS — Z9989 Dependence on other enabling machines and devices: Principal | ICD-10-CM

## 2015-06-30 DIAGNOSIS — G4733 Obstructive sleep apnea (adult) (pediatric): Secondary | ICD-10-CM

## 2015-06-30 DIAGNOSIS — I10 Essential (primary) hypertension: Secondary | ICD-10-CM

## 2015-06-30 DIAGNOSIS — M25471 Effusion, right ankle: Secondary | ICD-10-CM | POA: Diagnosis not present

## 2015-06-30 DIAGNOSIS — I480 Paroxysmal atrial fibrillation: Secondary | ICD-10-CM | POA: Diagnosis not present

## 2015-06-30 DIAGNOSIS — M25472 Effusion, left ankle: Secondary | ICD-10-CM

## 2015-06-30 MED ORDER — HYDROCHLOROTHIAZIDE 12.5 MG PO CAPS: 12.5000 mg | ORAL_CAPSULE | ORAL | Status: DC | PRN

## 2015-06-30 NOTE — Patient Instructions (Signed)
Your physician has recommended you make the following change in your medication:   1.) start new prescription for the fluid pill.as directed. This has been sent to your [facility].  Your physician wants you to follow-up in: 6 months or sooner if needed. You will receive a reminder letter in the mail two months in advance. If you don't receive a letter, please call our office to schedule the follow-up appointment.  If you need a refill on your cardiac medications before your next appointment, please call your pharmacy.

## 2015-07-02 ENCOUNTER — Encounter: Payer: Self-pay | Admitting: Cardiovascular Disease

## 2015-07-02 DIAGNOSIS — M25471 Effusion, right ankle: Secondary | ICD-10-CM | POA: Insufficient documentation

## 2015-07-02 DIAGNOSIS — M25472 Effusion, left ankle: Secondary | ICD-10-CM

## 2015-07-02 NOTE — Progress Notes (Signed)
Patient ID: Yaphet Smethurst, male   DOB: [birth year], 75 y.o.   MRN: [id number]     Primary M.D.: Dr. Mallie Mussel   HPI:  Chritopher Coster is a 75 y.o. male who has a history of PAF, and recent diagnosis of obstructive sleep apnea.  He developed recurrent atrial fibrillation and was started on flecanide.  He presents for follow-up evaluation.  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  He underwent endoscopy by Dr. Erskine Emery and was noted to have an irregular heart rhythm with  sinus rhythm with PVCs and PACs when connected to the monitor.  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  He had undergone a cardiac catheterization in approximately 1985.  Last year  noticed chest tightness when working out and also had noticed significant increasing shortness of breath.  He has been aware of a somewhat irregular rhythm.  Over several months.  In July he was recently treated with diverticulitis with Cipro and Flagyl and had noticed increased palpitations at that time.  When I saw for initial evaluation on 09/27/2014 he was in atrial fibrillation which he was completely unaware of.  On further questioning, he omitted that he snores very loudly and has had significant episodes of witnessed apnea.  He can fall asleep during the day at any time if circumstances permit.  He has a history of hypertension.  Recently he had been taking atenolol 150 mg in the morning had taken testosterone injections once a week.  I changed his atenolol to 100 mg in the morning and 50 mg grams at night.  He was significantly hypertensive and I added amlodipine 5 mg to his medical regimen.  I scheduled him for a nuclear perfusion study which was done in 10/07/2014.  There were no ECG changes.  There was a small defect of mild severity in the basal inferior wall.   It was felt most likely this was diaphragmatic attenuation.  Ejection fraction was 59%.  An echo Doppler study revealed an EF of 50-55% and raise the possibility of mild posterior basal hypokinesis.  He had mild AR and mild MR.  He was started on eliquis at his office visit.  On follow-up evaluation, his blood pressure was still elevated.  I increased his amlodipine to 7.5 mg in the morning and also started him on losartan 50 mg daily.  He underwent a sleep study which confirm my suspicion for obstructive sleep apnea.  Overall sleep apnea was mild with an AHI of 12.3 , but events were moderate with supine sleep with an AHI of 22.8 per hour. He has significant oxygen desaturation to a nadir of 81% and on the initial study had reduced sleep efficiency at 61.8% and evidence for loud snoring.  He subsequently underwent a CPAP titration trial and he was titrated up to 8 cm water pressure with excellent result.  He underwent successful cardioversion for his atrial fibrillation  On 12/01/2014. He admits to one brief episode following the cardioversion while sleeping before CPAP therapy was initiated and this had reverted back to normal rhythm in the morning when he awakened  CPAP was initiated on November 14, 2014. He has a ResMed Airfit P10 nasalpillow mask, medium size. A download was reviewed from 12/12/2014 through 01/10/2015. His compliance is excellent at 100%  of usage.  He is averaging 8 hours and 24 minutes.  His AHI was still mildly elevated at 6.5, but significant improved initially.  There is no significant leak.  He had undergone biopsy of his laryngeal nodule, which was negative.  When I saw him in January 2017, he complained of several days of being back in atrial fibrillation which seem to be precipitated after prolonged sneezing spell.  When I saw him, with his recurrent AF and normal LV function.  I recommended initiation of flecanide at 50 mg twice a day.  He was advised to reduce his atenolol  from 75 g twice a day to 50 mg twice a day for 2 days and on the third day to reduce his atenolol to 25 g twice a day.  After 2 doses of flecanide he felt that his heart rhythm had again stabilized.  A CPAP download  from January 24, 2015 through 02/19/2015 demonstrated 100% compliance.  He averaged 9 hours of sleep per night.  He is set at an auto mode and his 95th percentile pressure was 13.7 with a maximum 16.9.  AHI was 7.9.  Of note, one week ago when I saw the patient.  We discussed turning off his ramp since at the beginning of his sleep.  He was having some difficulty getting adequate air. I rechecked a download for the past week when the has been off, AHI was slightly improved at 5.8/hr.  He feels well.  He has more energy.  He is exercising regularly.  He believes his sleep is good quality.  He denies residual daytime sleepiness.  When I last saw him, he states that he is been doing exceptionally well.  He has been working out very hard.  He has been under increased mental stress and felt that  he went back into atrial fibrillation for approximately 12 hours, but then ultimately his rhythm stabilized.  When I saw him 6 weeks ago, he was in sinus rhythm.  At that time I increased his flecainide to 75 mg twice a day.  I also scheduled him for routine treadmill test to make certain he did not have any flecanide induced proarrhythmia.  This did not demonstrate any exercise-induced arrhythmia.  He was noted to have upsloping ST segment depression of approximately 1 mm with rapid resolution in less than 1 minute in early recovery.  Previously, his nuclear perfusion study did not show any ischemia.  He feels that his mental stress has improved since his daughter seems to be reconciling with her husband and is no longer planning to go through a divorce.  He denies any chest pain or shortness of breath.  On the increased flecainide dose h denies any recurrent palpitations.  He has noticed some intermittent leg  swelling near his ankles.  He is using CPAP with 100% compliance and feels that he is sleeping much better.  He presents for reevaluation.  Past Medical History  Diagnosis Date  . Hypertension   . Seasonal allergies   . Arthritis     neck  . Tubular adenoma of colon 2013    colonoscpy in 5 years [name]  . Dysrhythmia     afib - cardioverted 01/2015  . Neuromuscular disorder ([place])   . History of kidney stones   . Pneumonia   . Shortness of breath dyspnea     with exertion  . Melanoma ([place]) 2000    back; stage 3, squamous 1 - basal- several  . Phlebitis  Past Surgical History  Procedure Laterality Date  . Cataract extraction  2002, 2004    bilateral  . Melanoma removal  2000    back  . Tendon repair Right 2012    right  . Elbow surgery Left 1984    tendon repair left arm  . Tonsillectomy    . Cardioversion N/A 12/01/2014    Procedure: CARDIOVERSION;  Surgeon: Troy Sine, MD;  Location: [facility] [place];  Service: Cardiovascular;  Laterality: N/A;  . Upper gi endoscopy    . Cystoscopy    . Lithotripsy    . 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 Lisinopril     REACTION: severe GI upset requiring EGD--temporally related to lisinopril--tolerated micardis    Current Outpatient Prescriptions  Medication Sig Dispense Refill  . amLODipine (NORVASC) 5 MG tablet Take 1.5 tablets (7.5 mg total) by mouth daily. 135 tablet 3  . atenolol (TENORMIN) 25 MG tablet Take 25 mg by mouth 2 (two) times daily.    [name] ELIQUIS 5 MG TABS tablet TAKE ONE TABLET BY MOUTH TWICE DAILY 60 tablet 9  . flecainide (TAMBOCOR) 50 MG tablet Take 1.5 tablets (75 mg total) by mouth 2 (two) times daily. 180 tablet 6  . GLUCOSAMINE-CHONDROITIN PO Take 2 tablets by  mouth daily.    [name] losartan (COZAAR) 100 MG tablet Take 1 tablet (100 mg total) by mouth daily. 90 tablet 3  . Multiple Vitamin (MULTIVITAMIN) tablet Take 1 tablet by mouth daily.    . Saw Palmetto, Serenoa repens, (SAW PALMETTO PO) Take 1 tablet by mouth daily.    [name] testosterone cypionate (DEPOTESTOSTERONE CYPIONATE) 200 MG/ML injection Inject 100 mg into the muscle once a week.     . hydrochlorothiazide (MICROZIDE) 12.5 MG capsule Take 1 capsule (12.5 mg total) by mouth as needed (for swelling). 30 capsule 6   No current facility-administered medications for this visit.    Social History   Social History  . Marital Status: Married    Spouse Name: N/A  . Number of Children: 2  . Years of Education: N/A   Occupational History  . retired    Social History Main Topics  . Smoking status: Former Smoker    Start date: 11/20/1976  . Smokeless tobacco: Never Used     Comment: quit smoking 1986 ish  . Alcohol Use: No     Comment: occ   . Drug Use: No  . Sexual Activity: Not on file   Other Topics Concern  . Not on file   Social History Narrative   Additional social history is notable in that he is married for 41 years.  He has 2 children and 2 grandchildren.  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  He completed 12th grade of education.  Remotely he had smoked but quit in 1977.  He drinks 3 beers per day.  He has been exercising fairly regularly at least 4 days per week including weightlifting and cardio for~ 2 hours per session.  Family History  Problem Relation Age of Onset  . Colon cancer Neg Hx   . Stomach cancer Neg Hx   . Heart disease Mother    Family history is notable that his mother died at age  93 with heart disease and had atrial fibrillation and congestive heart failure.  Father died at 38 with a brain tumor.  He has 2 sisters and both have hypertension.   ROS General: Negative; No fevers, chills, or night sweats HEENT: Negative; No  changes in vision or hearing, sinus congestion, difficulty swallowing Pulmonary: Negative; No cough, wheezing, shortness of breath, hemoptysis Cardiovascular:  See HPI;  GI: Negative; No nausea, vomiting, diarrhea, or abdominal pain GU: Negative; No dysuria, hematuria, or difficulty voiding Musculoskeletal: Negative; no myalgias, joint pain, or weakness Hematologic/Oncologic: Negative; no easy bruising, bleeding Endocrine: Negative; no heat/cold intolerance; no diabetes Neuro: Negative; no changes in balance, headaches Skin: Negative; No rashes or skin lesions Psychiatric: Negative; No behavioral problems, depression Sleep: Positive for previously loud snoring, frequent awakenings, nonrestorative sleep, and daytime sleepiness, hypersomnolence, now on CPAP therapy with resolution of symptoms; no bruxism, restless legs, hypnogagnic hallucinations Other comprehensive 14 point system review is negative   Physical Exam BP 128/76 mmHg  Pulse 61  Ht 6' (1.829 m)  Wt 214 lb 9.6 oz (97.342 kg)  BMI 29.10 kg/m2  Repeat blood pressure by me 140/80  Wt Readings from Last 3 Encounters:  06/30/15 214 lb 9.6 oz (97.342 kg)  05/16/15 213 lb 12.8 oz (96.979 kg)  02/23/15 212 lb 9.6 oz (96.435 kg)   General: Alert, oriented, no distress.  Skin: normal turgor, no rashes, warm and dry HEENT: Normocephalic, atraumatic. Pupils equal round and reactive to light; sclera anicteric; extraocular muscles intact; Fundi mild arterial narrowing Nose without nasal septal hypertrophy Mouth/Parynx benign; Mallinpatti scale 3 Neck: No JVD, no carotid bruits; normal carotid upstroke Lungs: clear to ausculatation and percussion; no wheezing or rales Chest wall: without tenderness to palpitation Heart: PMI not displaced, rhythm is now regular with a ventricular rate in the 50's, s1 s2 normal, 1/6 systolic murmur, no diastolic murmur, no rubs, gallops, thrills, or heaves Abdomen: soft, nontender; no  hepatosplenomehaly, BS+; abdominal aorta nontender and not dilated by palpation. Back: no CVA tenderness Pulses 2+ Musculoskeletal: full range of motion, normal strength, no joint deformities Extremities: no clubbing cyanosis or edema, Homan's sign negative  Neurologic: grossly nonfocal; Cranial nerves grossly wnl Psychologic: Normal mood and affect  ECG (independently read by me): Sinus bradycardia at 56 bpm.  No significant ST-T changes.  QTc interval 389 ms.  05/16/2015 ECG (independently read by me): Sinus bradycardia 59 bpm.  QTc interval 376 ms.  No significant ST changes.  02/23/2015 ECG (independently read by me): Sinus bradycardia 59 bpm.  QTc interval 390 ms.  Nonspecific T changes.  02/17/15 ECG (independently read by me): Atrial fibrillation at 58 bpm.  Nonspecific ST changes.  QTc interval 380 ms.  01/12/2015 ECG  (independently read by me):  Normal sinus rhythm at 62 bpm. Nonspecific ST-T changes.  September 2016 ECG (independently read by me): Atrial fibrillation with a slow ventricular response in the 50s.  QTc interval 364 ms.  Prior CG (independently read by me):  Atrial fibrillation with ventricular rate at 52 bpm.  No significant ST-T change.  09/27/2014 ECG (independently read by me): Atrial fibrillation with ventricular rate at 59 bpm.  Nonspecific ST-T changes.  LABS:  BMP Latest Ref Rng 02/17/2015 01/25/2015 11/24/2014  Glucose 65 - 99 mg/dL 75 102(H) 85  BUN 7 - 25 mg/dL _0 Creatinine 0.70 - 1.18 mg/dL 1.37(H) 1.16 1.10  Sodium 135 - 146 mmol/L 138 136 137  Potassium 3.5 - 5.3 mmol/L 4.6 4.6 4.3  Chloride 98 - 110 mmol/L 102 106 98  CO2 20 - 31 mmol/L _0 Calcium 8.6 - 10.3 mg/dL 9.8 9.4 9.5     Hepatic Function Latest Ref Rng 02/17/2015 10/21/2014 09/27/2014  Total Protein 6.1 - 8.1 g/dL 6.8 6.8 6.4  Albumin 3.6 - 5.1 g/dL 4.1 4.0 4.0  AST 10 - 35 U/L 20 31 35  ALT 9 - 46 U/L 19 42 68(H)  Alk Phosphatase 40 - 115 U/L 67 81 77  Total Bilirubin  0.2 - 1.2 mg/dL 0.9 1.1 1.2    CBC Latest Ref Rng 02/17/2015 01/25/2015 11/24/2014  WBC 4.0 - 10.5 K/uL 8.9 5.5 6.0  Hemoglobin 13.0 - 17.0 g/dL 16.0 16.0 15.9  Hematocrit 39.0 - 52.0 % 45.3 45.8 45.1  Platelets 150 - 400 K/uL 208 175 169   Lab Results  Component Value Date   MCV 89.2 02/17/2015   MCV 90.0 01/25/2015   MCV 91.1 11/24/2014   Lab Results  Component Value Date   TSH 2.626 02/17/2015   Lab Results  Component Value Date   HGBA1C 4.9 05/06/2006     BNP No results found for: BNP  ProBNP No results found for: PROBNP   Lipid Panel     Component Value Date/Time   CHOL 160 10/21/2014 1055   TRIG 119 10/21/2014 1055   HDL 41 10/21/2014 1055   CHOLHDL 3.9 10/21/2014 1055   VLDL 24 10/21/2014 1055   LDLCALC 95 10/21/2014 1055    RADIOLOGY: No results found.   ASSESSMENT AND PLAN: Mr. Deejay Koppelman is a 75 year old male who was found to be in atrial fibrillation in August 2016 and was started on anticoagulation.  He has been on beta blocker therapy and was started on ARB therapy with losartan.  He was found to have mild to moderate sleep apnea overall, but moderate sleep apnea during supine sleep with an AHI of 22.8 per hour.  He feels significantly improved since initiating CPAP therapy and continues to have 100% compliance.  He underwent initial successful cardioversion on 12/01/2014 and in December after holding eliquis underwent successful biopsy of his laryngeal nodule which proved negative for malignancy.  He develop recurrent atrial fibrillation in January but ultimately converted to sinus rhythm with institution of flecainide 50 mg bid.  He again had another episode of short-lived PAF lasting less than 12 hours , which led to dose titration of flecainide to 75 mg twice a day.  He has not been aware of any palpitations since that time.  His routine treadmill test did not reveal any proarrhythmia.  He did have upsloping ST segment depression with rapid resolution  in recovery.  He is asymptomatic with reference to chest pain and a previous nuclear scan did not reveal ischemia.  He has been experiencing intermittent episodes of ankle swelling.  I'm giving him a prescription for HCTZ 12.5 mg to take on an as-needed basis.  His mental stress is significantly relieved with his daughters reconciliation with her husband.  He is tolerating eloquence without bleeding.  His blood pressure today is stable on current therapy consisting of amlodipine, atenolol and losartan.  I will see him in 6 months for cardiology reevaluation.  Time spent: 25 minutes Troy Sine, MD, [facility] 07/02/2015 1:36 PM

## 2015-07-05 DIAGNOSIS — G4733 Obstructive sleep apnea (adult) (pediatric): Secondary | ICD-10-CM | POA: Diagnosis not present

## 2015-07-05 NOTE — Telephone Encounter (Signed)
Patient saw TK in May

## 2015-07-12 DIAGNOSIS — G4733 Obstructive sleep apnea (adult) (pediatric): Secondary | ICD-10-CM | POA: Diagnosis not present

## 2015-07-17 DIAGNOSIS — L570 Actinic keratosis: Secondary | ICD-10-CM | POA: Diagnosis not present

## 2015-07-17 DIAGNOSIS — Z85828 Personal history of other malignant neoplasm of skin: Secondary | ICD-10-CM | POA: Diagnosis not present

## 2015-07-17 DIAGNOSIS — D485 Neoplasm of uncertain behavior of skin: Secondary | ICD-10-CM | POA: Diagnosis not present

## 2015-07-17 DIAGNOSIS — C44212 Basal cell carcinoma of skin of right ear and external auricular canal: Secondary | ICD-10-CM | POA: Diagnosis not present

## 2015-07-17 DIAGNOSIS — L820 Inflamed seborrheic keratosis: Secondary | ICD-10-CM | POA: Diagnosis not present

## 2015-07-17 DIAGNOSIS — D1801 Hemangioma of skin and subcutaneous tissue: Secondary | ICD-10-CM | POA: Diagnosis not present

## 2015-07-17 DIAGNOSIS — Z85820 Personal history of malignant melanoma of skin: Secondary | ICD-10-CM | POA: Diagnosis not present

## 2015-07-17 DIAGNOSIS — L821 Other seborrheic keratosis: Secondary | ICD-10-CM | POA: Diagnosis not present

## 2015-07-17 NOTE — Addendum Note (Signed)
Addended by: Therisa Doyne on: 07/17/2015 01:00 PM   Modules accepted: Orders

## 2015-08-10 DIAGNOSIS — R69 Illness, unspecified: Secondary | ICD-10-CM | POA: Diagnosis not present

## 2015-08-11 DIAGNOSIS — G4733 Obstructive sleep apnea (adult) (pediatric): Secondary | ICD-10-CM | POA: Diagnosis not present

## 2015-08-17 DIAGNOSIS — Z85820 Personal history of malignant melanoma of skin: Secondary | ICD-10-CM | POA: Diagnosis not present

## 2015-08-17 DIAGNOSIS — Z85828 Personal history of other malignant neoplasm of skin: Secondary | ICD-10-CM | POA: Diagnosis not present

## 2015-08-17 DIAGNOSIS — C44212 Basal cell carcinoma of skin of right ear and external auricular canal: Secondary | ICD-10-CM | POA: Diagnosis not present

## 2015-09-11 DIAGNOSIS — G4733 Obstructive sleep apnea (adult) (pediatric): Secondary | ICD-10-CM | POA: Diagnosis not present

## 2015-09-29 DIAGNOSIS — E291 Testicular hypofunction: Secondary | ICD-10-CM | POA: Diagnosis not present

## 2015-09-29 DIAGNOSIS — R351 Nocturia: Secondary | ICD-10-CM | POA: Diagnosis not present

## 2015-09-29 DIAGNOSIS — R972 Elevated prostate specific antigen [PSA]: Secondary | ICD-10-CM | POA: Diagnosis not present

## 2015-09-29 DIAGNOSIS — R350 Frequency of micturition: Secondary | ICD-10-CM | POA: Diagnosis not present

## 2015-09-29 DIAGNOSIS — N401 Enlarged prostate with lower urinary tract symptoms: Secondary | ICD-10-CM | POA: Diagnosis not present

## 2015-10-06 DIAGNOSIS — G4733 Obstructive sleep apnea (adult) (pediatric): Secondary | ICD-10-CM | POA: Diagnosis not present

## 2015-10-09 ENCOUNTER — Other Ambulatory Visit: Payer: Self-pay | Admitting: Cardiovascular Disease

## 2015-10-09 NOTE — Telephone Encounter (Signed)
Rx(s) sent to pharmacy electronically.  

## 2015-10-12 DIAGNOSIS — G4733 Obstructive sleep apnea (adult) (pediatric): Secondary | ICD-10-CM | POA: Diagnosis not present

## 2015-11-06 DIAGNOSIS — H52203 Unspecified astigmatism, bilateral: Secondary | ICD-10-CM | POA: Diagnosis not present

## 2015-11-06 DIAGNOSIS — Z961 Presence of intraocular lens: Secondary | ICD-10-CM | POA: Diagnosis not present

## 2015-11-11 DIAGNOSIS — G4733 Obstructive sleep apnea (adult) (pediatric): Secondary | ICD-10-CM | POA: Diagnosis not present

## 2015-11-21 ENCOUNTER — Encounter: Payer: Self-pay | Admitting: Cardiovascular Disease

## 2015-11-21 ENCOUNTER — Ambulatory Visit (INDEPENDENT_AMBULATORY_CARE_PROVIDER_SITE_OTHER): Payer: Medicare HMO | Admitting: Cardiovascular Disease

## 2015-11-21 VITALS — BP 148/72 | HR 57 | Ht 72.0 in | Wt 216.8 lb

## 2015-11-21 DIAGNOSIS — I481 Persistent atrial fibrillation: Secondary | ICD-10-CM | POA: Diagnosis not present

## 2015-11-21 DIAGNOSIS — I480 Paroxysmal atrial fibrillation: Secondary | ICD-10-CM | POA: Diagnosis not present

## 2015-11-21 DIAGNOSIS — I10 Essential (primary) hypertension: Secondary | ICD-10-CM

## 2015-11-21 DIAGNOSIS — Z79899 Other long term (current) drug therapy: Secondary | ICD-10-CM | POA: Diagnosis not present

## 2015-11-21 DIAGNOSIS — Z9989 Dependence on other enabling machines and devices: Secondary | ICD-10-CM

## 2015-11-21 DIAGNOSIS — Z7901 Long term (current) use of anticoagulants: Secondary | ICD-10-CM

## 2015-11-21 DIAGNOSIS — G4733 Obstructive sleep apnea (adult) (pediatric): Secondary | ICD-10-CM

## 2015-11-21 DIAGNOSIS — I4819 Other persistent atrial fibrillation: Secondary | ICD-10-CM

## 2015-11-21 NOTE — Progress Notes (Signed)
Patient ID: Mario [name], male   DOB: Nov 22, 1940, 75 y.o.   MRN: [id number]     Primary M.D.: Dr. Mallie Mussel   HPI:  Mario [name] is a 75 y.o. male who presents for a 5 month follow-up cardiology evaluation.  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  He underwent endoscopy by Dr. Erskine Emery and was noted to have an irregular heart rhythm with  sinus rhythm with PVCs and PACs when connected to the monitor.  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  He had undergone a cardiac catheterization in approximately 1985.  Last year  noticed chest tightness when working out and also had noticed significant increasing shortness of breath.  He has been aware of a somewhat irregular rhythm.  Over several months.  In July he was recently treated with diverticulitis with Cipro and Flagyl and had noticed increased palpitations at that time.  When I saw for initial evaluation on 09/27/2014 he was in atrial fibrillation which he was completely unaware of.  On further questioning, he omitted that he snores very loudly and has had significant episodes of witnessed apnea.  He can fall asleep during the day at any time if circumstances permit.  He has a history of hypertension.  Recently he had been taking atenolol 150 mg in the morning had taken testosterone injections once a week.  I changed his atenolol to 100 mg in the morning and 50 mg grams at night.  He was significantly hypertensive and I added amlodipine 5 mg to his medical regimen.  I scheduled him for a nuclear perfusion study which was done in 10/07/2014.  There were no ECG changes.  There was a small defect of mild severity in the basal inferior wall.  It was felt most likely this was diaphragmatic attenuation.  Ejection fraction was 59%.  An echo Doppler study revealed an EF of  50-55% and raise the possibility of mild posterior basal hypokinesis.  He had mild AR and mild MR.  He was started on eliquis at his office visit.  On follow-up evaluation, his blood pressure was still elevated.  I increased his amlodipine to 7.5 mg in the morning and also started him on losartan 50 mg daily.  He underwent a sleep study which confirm my suspicion for obstructive sleep apnea.  Overall sleep apnea was mild with an AHI of 12.3 , but events were moderate with supine sleep with an AHI of 22.8 per hour. He has significant oxygen desaturation to a nadir of 81% and on the initial study had reduced sleep efficiency at 61.8% and evidence for loud snoring.  He subsequently underwent a CPAP titration trial and he was titrated up to 8 cm water pressure with excellent result.  He underwent successful cardioversion for his atrial fibrillation  On 12/01/2014. He admits to one brief episode following the cardioversion while sleeping before CPAP therapy was initiated and this had reverted back to normal rhythm in the morning when he awakened  CPAP was initiated on November 14, 2014. He has a ResMed Airfit P10 nasalpillow mask, medium size. A download was reviewed from 12/12/2014 through 01/10/2015. His compliance is excellent at 100% of usage.  He is averaging 8 hours and 24 minutes.  His AHI was still mildly elevated at 6.5, but  significant improved initially.  There is no significant leak.  He had undergone biopsy of his laryngeal nodule, which was negative.  When I saw him in January 2017, he complained of several days of being back in atrial fibrillation which seem to be precipitated after prolonged sneezing spell.  When I saw him, with his recurrent AF and normal LV function.  I recommended initiation of flecanide at 50 mg twice a day.  He was advised to reduce his atenolol from 75 g twice a day to 50 mg twice a day for 2 days and on the third day to reduce his atenolol to 25 g twice a day.  After 2  doses of flecanide he felt that his heart rhythm had again stabilized.  A CPAP download  from January 24, 2015 through 02/19/2015 demonstrated 100% compliance.  He averaged 9 hours of sleep per night.  He is set at an auto mode and his 95th percentile pressure was 13.7 with a maximum 16.9.  AHI was 7.9.  Of note, one week ago when I saw the patient.  We discussed turning off his ramp since at the beginning of his sleep.  He was having some difficulty getting adequate air. I rechecked a download for the past week when the has been off, AHI was slightly improved at 5.8/hr.  He feels well.  He has more energy.  He is exercising regularly.  He believes his sleep is good quality.  He denies residual daytime sleepiness.  In April 2016 he had been under increased mental stress and felt that  he went back into atrial fibrillation for approximately 12 hours, but then ultimately his rhythm stabilized.  When I saw him 6 weeks ago, he was in sinus rhythm.  At that time I increased his flecainide to 75 mg twice a day.  I also scheduled him for routine treadmill test to make certain he did not have any flecanide induced proarrhythmia.  This did not demonstrate any exercise-induced arrhythmia.  He was noted to have upsloping ST segment depression of approximately 1 mm with rapid resolution in less than 1 minute in early recovery.  Previously, his nuclear perfusion study did not show any ischemia.  He feels that his mental stress has improved since his daughter seems to be reconciling with her husband and is no longer planning to go through a divorce.  He denies any chest pain or shortness of breath.    Since I last saw him, he continues to be working out hard at [employer].  He is unaware of any breakthrough arrhythmia.  He is tolerating fleck and 9 at 75 mg twice a day in addition to atenolol 25 mg twice a day.  He has a prescription for HCTZ, but has not been taking this but does note some intermittent leg swelling around  the ankles, particularly on Sundays when he sings in a choir.  He has been taking amlodipine 5 mg and losartan additionally for blood pressure control.  He denies bleeding on eloquence.  He presents for reevaluation. Past Medical History:  Diagnosis Date  . Arthritis    neck  . Dysrhythmia    afib - cardioverted 01/2015  . History of kidney stones   . Hypertension   . Melanoma ([place]) 2000   back; stage 3, squamous 1 - basal- several  . Neuromuscular disorder ([place])   . Phlebitis   . Pneumonia   . Seasonal allergies   . Shortness of breath dyspnea  with exertion  . Tubular adenoma of colon 2013   colonoscpy in 5 years [name]    Past Surgical History:  Procedure Laterality Date  . CARDIOVERSION N/A 12/01/2014   Procedure: CARDIOVERSION;  Surgeon: Troy Sine, MD;  Location: [facility];  Service: Cardiovascular;  Laterality: N/A;  . CATARACT EXTRACTION  2002, 2004   bilateral  . CYSTOSCOPY    . ELBOW SURGERY Left 1984   tendon repair left arm  . LITHOTRIPSY    . melanoma removal  2000   back  . MICROLARYNGOSCOPY WITH LASER N/A 01/25/2015   Procedure: MICROLARYNGOSCOPY ;  Surgeon: Melida Quitter, MD;  Location: [facility];  Service: ENT;  Laterality: N/A;  Microlaryngoscopy with 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 with CO2 laser   . TENDON REPAIR Right 2012   right  . TONSILLECTOMY    . UPPER GI ENDOSCOPY      Allergies  Allergen Reactions  . Lisinopril     REACTION: severe GI upset requiring EGD--temporally related to lisinopril--tolerated micardis    Current Outpatient Prescriptions  Medication Sig Dispense Refill  . amLODipine (NORVASC) 5 MG tablet Take 1.5 tablets (7.5 mg total) by mouth daily. 135 tablet 3  . atenolol (TENORMIN) 25 MG tablet Take 25 mg by mouth 2 (two) times daily.    [name] ELIQUIS 5 MG TABS tablet TAKE ONE TABLET BY MOUTH TWICE DAILY 60 tablet 9  . flecainide (TAMBOCOR) 50 MG tablet Take 1.5 tablets (75 mg total) by mouth 2 (two) times  daily. 180 tablet 6  . GLUCOSAMINE-CHONDROITIN PO Take 1 tablet by mouth daily.     . hydrochlorothiazide (MICROZIDE) 12.5 MG capsule Take 1 capsule (12.5 mg total) by mouth as needed (for swelling). 30 capsule 6  . losartan (COZAAR) 100 MG tablet TAKE ONE TABLET BY MOUTH ONCE DAILY 90 tablet 3  . Multiple Vitamin (MULTIVITAMIN) tablet Take 1 tablet by mouth daily.    . Saw Palmetto, Serenoa repens, (SAW PALMETTO PO) Take 1 tablet by mouth daily.    [name] testosterone cypionate (DEPOTESTOSTERONE CYPIONATE) 200 MG/ML injection Inject 100 mg into the muscle once a week.      No current facility-administered medications for this visit.     Social History   Social History  . Marital status: Married    Spouse name: N/A  . Number of children: 2  . Years of education: N/A   Occupational History  . retired    Social History Main Topics  . Smoking status: Former Smoker    Start date: 11/20/1976  . Smokeless tobacco: Never Used     Comment: quit smoking 1986 ish  . Alcohol use No     Comment: occ   . Drug use: No  . Sexual activity: Not on file   Other Topics Concern  . Not on file   Social History Narrative  . No narrative on file   Additional social history is notable in that he is married for 41 years.  He has 2 children and 2 grandchildren.  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  He completed 12th grade of education.  Remotely he had smoked but quit in 1977.  He drinks 3 beers per day.  He has been exercising fairly regularly at least 4 days per week including weightlifting and cardio for~ 2 hours per session.  Family History  Problem Relation Age of Onset  . Colon cancer Neg Hx   . Stomach cancer Neg  Hx   . Heart disease Mother    Family history is notable that his mother died at age 86 with heart disease and had atrial fibrillation and congestive heart failure.  Father died at 69 with a brain tumor.  He has 2 sisters and both have  hypertension.   ROS General: Negative; No fevers, chills, or night sweats HEENT: Negative; No changes in vision or hearing, sinus congestion, difficulty swallowing Pulmonary: Negative; No cough, wheezing, shortness of breath, hemoptysis Cardiovascular:  See HPI;  GI: Negative; No nausea, vomiting, diarrhea, or abdominal pain GU: Negative; No dysuria, hematuria, or difficulty voiding Musculoskeletal: Negative; no myalgias, joint pain, or weakness Hematologic/Oncologic: Negative; no easy bruising, bleeding Endocrine: Negative; no heat/cold intolerance; no diabetes Neuro: Negative; no changes in balance, headaches Skin: Negative; No rashes or skin lesions Psychiatric: Negative; No behavioral problems, depression Sleep: Positive for previously loud snoring, frequent awakenings, nonrestorative sleep, and daytime sleepiness, hypersomnolence, now on CPAP therapy with resolution of symptoms; no bruxism, restless legs, hypnogagnic hallucinations Other comprehensive 14 point system review is negative   Physical Exam BP (!) 148/72 (BP Location: Right Arm, Patient Position: Sitting, Cuff Size: Normal)   Pulse (!) 57   Ht 6' (1.829 m)   Wt 216 lb 12.8 oz (98.3 kg)   BMI 29.40 kg/m   Repeat blood pressure by me 140/70  Wt Readings from Last 3 Encounters:  11/21/15 216 lb 12.8 oz (98.3 kg)  06/30/15 214 lb 9.6 oz (97.3 kg)  05/16/15 213 lb 12.8 oz (97 kg)   General: Alert, oriented, no distress.  Skin: normal turgor, no rashes, warm and dry HEENT: Normocephalic, atraumatic. Pupils equal round and reactive to light; sclera anicteric; extraocular muscles intact; Fundi mild arterial narrowing Nose without nasal septal hypertrophy Mouth/Parynx benign; Mallinpatti scale 3 Neck: No JVD, no carotid bruits; normal carotid upstroke Lungs: clear to ausculatation and percussion; no wheezing or rales Chest wall: without tenderness to palpitation Heart: PMI not displaced, rhythm is now regular with a  ventricular rate in the 50's, s1 s2 normal, 1/6 systolic murmur, no diastolic murmur, no rubs, gallops, thrills, or heaves Abdomen: soft, nontender; no hepatosplenomehaly, BS+; abdominal aorta nontender and not dilated by palpation. Back: no CVA tenderness Pulses 2+ Musculoskeletal: full range of motion, normal strength, no joint deformities Extremities: Trivial, if any, left ankle edema no clubbing cyanosis, Homan's sign negative  Neurologic: grossly nonfocal; Cranial nerves grossly wnl Psychologic: Normal mood and affect  ECG (independently read by me): Sinus bradycardia 57 bpm.  Nonspecific ST changes.  QTc interval 393 ms.  06/30/2015 ECG (independently read by me): Sinus bradycardia at 56 bpm.  No significant ST-T changes.  QTc interval 389 ms.  05/16/2015 ECG (independently read by me): Sinus bradycardia 59 bpm.  QTc interval 376 ms.  No significant ST changes.  02/23/2015 ECG (independently read by me): Sinus bradycardia 59 bpm.  QTc interval 390 ms.  Nonspecific T changes.  02/17/15 ECG (independently read by me): Atrial fibrillation at 58 bpm.  Nonspecific ST changes.  QTc interval 380 ms.  01/12/2015 ECG  (independently read by me):  Normal sinus rhythm at 62 bpm. Nonspecific ST-T changes.  September 2016 ECG (independently read by me): Atrial fibrillation with a slow ventricular response in the 50s.  QTc interval 364 ms.  Prior CG (independently read by me):  Atrial fibrillation with ventricular rate at 52 bpm.  No significant ST-T change.  09/27/2014 ECG (independently read by me): Atrial fibrillation with ventricular rate at 59 bpm.  Nonspecific ST-T changes.  LABS:  BMP Latest Ref Rng & Units 02/17/2015 01/25/2015 11/24/2014  Glucose 65 - 99 mg/dL 75 102(H) 85  BUN 7 - 25 mg/dL _0 Creatinine 0.70 - 1.18 mg/dL 1.37(H) 1.16 1.10  Sodium 135 - 146 mmol/L 138 136 137  Potassium 3.5 - 5.3 mmol/L 4.6 4.6 4.3  Chloride 98 - 110 mmol/L 102 106 98  CO2 20 - 31 mmol/L _1 Calcium 8.6 - 10.3 mg/dL 9.8 9.4 9.5     Hepatic Function Latest Ref Rng & Units 02/17/2015 10/21/2014 09/27/2014  Total Protein 6.1 - 8.1 g/dL 6.8 6.8 6.4  Albumin 3.6 - 5.1 g/dL 4.1 4.0 4.0  AST 10 - 35 U/L 20 31 35  ALT 9 - 46 U/L 19 42 68(H)  Alk Phosphatase 40 - 115 U/L 67 81 77  Total Bilirubin 0.2 - 1.2 mg/dL 0.9 1.1 1.2    CBC Latest Ref Rng & Units 02/17/2015 01/25/2015 11/24/2014  WBC 4.0 - 10.5 K/uL 8.9 5.5 6.0  Hemoglobin 13.0 - 17.0 g/dL 16.0 16.0 15.9  Hematocrit 39.0 - 52.0 % 45.3 45.8 45.1  Platelets 150 - 400 K/uL 208 175 169   Lab Results  Component Value Date   MCV 89.2 02/17/2015   MCV 90.0 01/25/2015   MCV 91.1 11/24/2014   Lab Results  Component Value Date   TSH 2.626 02/17/2015   Lab Results  Component Value Date   HGBA1C 4.9 05/06/2006     BNP No results found for: BNP  ProBNP No results found for: PROBNP   Lipid Panel     Component Value Date/Time   CHOL 160 10/21/2014 1055   TRIG 119 10/21/2014 1055   HDL 41 10/21/2014 1055   CHOLHDL 3.9 10/21/2014 1055   VLDL 24 10/21/2014 1055   LDLCALC 95 10/21/2014 1055    RADIOLOGY: No results found.   ASSESSMENT AND PLAN: Mr. Mario [name] is a 75 year old male who was found to be in atrial fibrillation in August 2016 and was started on anticoagulation.  He has been on beta blocker therapy and was started on ARB therapy with losartan.  He was found to have mild to moderate sleep apnea overall, but moderate sleep apnea during supine sleep with an AHI of 22.8 per hour.  He feels significantly improved since initiating CPAP therapy and continues to have 100% compliance.  Presently, he feels he cannot sleep without CPAP and has noticed the marked benefit of treatment.  He underwent initial successful cardioversion on 12/01/2014 and in December after holding eliquis underwent successful biopsy of his laryngeal nodule which proved negative for malignancy.  He develop recurrent atrial fibrillation in  January but ultimately converted to sinus rhythm with institution of flecainide 50 mg bid.  He again had another episode of short-lived PAF lasting less than 12 hours , which led to dose titration of flecainide to 75 mg twice a day. His routine treadmill test did not reveal any proarrhythmia.  He did have upsloping ST segment depression with rapid resolution in recovery.  He is asymptomatic with reference to chest pain and a previous nuclear scan did not reveal ischemia.  He has been without any episodes of recurrent AF or awareness of palpitations on his increased medical regimen.  He feels improved with reference to stress and has been working out pretty hard at the gym where he lives, free weights.  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  Repeat blood work will be obtained in the fasting state.  I will contact him regarding the results and adjustments to his medical regimen are necessary.  His blood pressure today is controlled on his current regimen of losartan, atenolol, and amlodipine.  I will see him in 6 months for reevaluation.  Time spent: 25 minutes  Troy Sine, MD, [facility] 11/21/2015 5:59 PM

## 2015-11-21 NOTE — Patient Instructions (Signed)
Your physician recommends that you return for lab work fasting.   Your physician wants you to follow-up in: 6 months or sooner if needed. You will receive a reminder letter in the mail two months in advance. If you don't receive a letter, please call our office to schedule the follow-up appointment.  If you need a refill on your cardiac medications before your next appointment, please call your pharmacy.   

## 2015-11-28 DIAGNOSIS — Z79899 Other long term (current) drug therapy: Secondary | ICD-10-CM | POA: Diagnosis not present

## 2015-11-28 DIAGNOSIS — I481 Persistent atrial fibrillation: Secondary | ICD-10-CM | POA: Diagnosis not present

## 2015-11-28 DIAGNOSIS — I10 Essential (primary) hypertension: Secondary | ICD-10-CM | POA: Diagnosis not present

## 2015-11-29 LAB — COMPREHENSIVE METABOLIC PANEL
ALBUMIN: 3.8 g/dL (ref 3.6–5.1)
ALT: 15 U/L (ref 9–46)
AST: 25 U/L (ref 10–35)
Alkaline Phosphatase: 60 U/L (ref 40–115)
BILIRUBIN TOTAL: 0.7 mg/dL (ref 0.2–1.2)
BUN: 18 mg/dL (ref 7–25)
CO2: 25 mmol/L (ref 20–31)
CREATININE: 1.18 mg/dL (ref 0.70–1.18)
Calcium: 9.1 mg/dL (ref 8.6–10.3)
Chloride: 100 mmol/L (ref 98–110)
Glucose, Bld: 84 mg/dL (ref 65–99)
Potassium: 4.7 mmol/L (ref 3.5–5.3)
SODIUM: 136 mmol/L (ref 135–146)
TOTAL PROTEIN: 6.6 g/dL (ref 6.1–8.1)

## 2015-11-29 LAB — TSH: TSH: 2.62 mIU/L (ref 0.40–4.50)

## 2015-11-29 LAB — LIPID PANEL
Cholesterol: 148 mg/dL (ref 125–200)
HDL: 45 mg/dL (ref >=40)
LDL CALC: 79 mg/dL (ref <130)
TRIGLYCERIDES: 122 mg/dL (ref <150)
Total CHOL/HDL Ratio: 3.3 Ratio (ref <=5.0)
VLDL: 24 mg/dL (ref <30)

## 2015-11-29 LAB — CBC
HCT: 48 % (ref 38.5–50.0)
HEMOGLOBIN: 16.2 g/dL (ref 13.2–17.1)
MCH: 31.7 pg (ref 27.0–33.0)
MCHC: 33.8 g/dL (ref 32.0–36.0)
MCV: 93.9 fL (ref 80.0–100.0)
MPV: 9.2 fL (ref 7.5–12.5)
PLATELETS: 198 10*3/uL (ref 140–400)
RBC: 5.11 MIL/uL (ref 4.20–5.80)
RDW: 14.3 % (ref 11.0–15.0)
WBC: 5 10*3/uL (ref 3.8–10.8)

## 2015-12-11 ENCOUNTER — Other Ambulatory Visit: Payer: Self-pay | Admitting: Cardiovascular Disease

## 2015-12-11 NOTE — Telephone Encounter (Signed)
Rx request sent to pharmacy.  

## 2015-12-12 DIAGNOSIS — G4733 Obstructive sleep apnea (adult) (pediatric): Secondary | ICD-10-CM | POA: Diagnosis not present

## 2015-12-13 ENCOUNTER — Encounter: Payer: Self-pay | Admitting: *Deleted

## 2015-12-13 ENCOUNTER — Telehealth: Payer: Self-pay | Admitting: *Deleted

## 2015-12-13 NOTE — Telephone Encounter (Signed)
-----   Message from Troy Sine, MD sent at 12/03/2015  4:33 PM EDT ----- Labs good

## 2015-12-13 NOTE — Telephone Encounter (Signed)
Patient notified of results. Copy of labs requested. Copy mailed per his request.

## 2016-01-12 ENCOUNTER — Ambulatory Visit: Payer: Medicare HMO | Admitting: Gastroenterology

## 2016-01-15 DIAGNOSIS — Z8709 Personal history of other diseases of the respiratory system: Secondary | ICD-10-CM | POA: Diagnosis not present

## 2016-01-15 DIAGNOSIS — Z7901 Long term (current) use of anticoagulants: Secondary | ICD-10-CM | POA: Diagnosis not present

## 2016-01-15 DIAGNOSIS — R042 Hemoptysis: Secondary | ICD-10-CM | POA: Diagnosis not present

## 2016-01-15 DIAGNOSIS — R490 Dysphonia: Secondary | ICD-10-CM | POA: Diagnosis not present

## 2016-01-16 DIAGNOSIS — Z85820 Personal history of malignant melanoma of skin: Secondary | ICD-10-CM | POA: Diagnosis not present

## 2016-01-16 DIAGNOSIS — D1801 Hemangioma of skin and subcutaneous tissue: Secondary | ICD-10-CM | POA: Diagnosis not present

## 2016-01-16 DIAGNOSIS — C44699 Other specified malignant neoplasm of skin of left upper limb, including shoulder: Secondary | ICD-10-CM | POA: Diagnosis not present

## 2016-01-16 DIAGNOSIS — L280 Lichen simplex chronicus: Secondary | ICD-10-CM | POA: Diagnosis not present

## 2016-01-16 DIAGNOSIS — L821 Other seborrheic keratosis: Secondary | ICD-10-CM | POA: Diagnosis not present

## 2016-01-16 DIAGNOSIS — C44619 Basal cell carcinoma of skin of left upper limb, including shoulder: Secondary | ICD-10-CM | POA: Diagnosis not present

## 2016-01-16 DIAGNOSIS — L570 Actinic keratosis: Secondary | ICD-10-CM | POA: Diagnosis not present

## 2016-01-16 DIAGNOSIS — Z85828 Personal history of other malignant neoplasm of skin: Secondary | ICD-10-CM | POA: Diagnosis not present

## 2016-01-16 DIAGNOSIS — C44519 Basal cell carcinoma of skin of other part of trunk: Secondary | ICD-10-CM | POA: Diagnosis not present

## 2016-01-16 DIAGNOSIS — L820 Inflamed seborrheic keratosis: Secondary | ICD-10-CM | POA: Diagnosis not present

## 2016-01-16 DIAGNOSIS — D485 Neoplasm of uncertain behavior of skin: Secondary | ICD-10-CM | POA: Diagnosis not present

## 2016-02-02 DIAGNOSIS — G4733 Obstructive sleep apnea (adult) (pediatric): Secondary | ICD-10-CM | POA: Diagnosis not present

## 2016-02-19 DIAGNOSIS — R69 Illness, unspecified: Secondary | ICD-10-CM | POA: Diagnosis not present

## 2016-02-23 ENCOUNTER — Other Ambulatory Visit: Payer: Self-pay | Admitting: Cardiovascular Disease

## 2016-03-18 DIAGNOSIS — R69 Illness, unspecified: Secondary | ICD-10-CM | POA: Diagnosis not present

## 2016-03-18 DIAGNOSIS — M25512 Pain in left shoulder: Secondary | ICD-10-CM | POA: Diagnosis not present

## 2016-03-20 DIAGNOSIS — E291 Testicular hypofunction: Secondary | ICD-10-CM | POA: Diagnosis not present

## 2016-03-20 DIAGNOSIS — R972 Elevated prostate specific antigen [PSA]: Secondary | ICD-10-CM | POA: Diagnosis not present

## 2016-03-21 DIAGNOSIS — M25512 Pain in left shoulder: Secondary | ICD-10-CM | POA: Diagnosis not present

## 2016-03-25 DIAGNOSIS — M25512 Pain in left shoulder: Secondary | ICD-10-CM | POA: Diagnosis not present

## 2016-03-27 DIAGNOSIS — R972 Elevated prostate specific antigen [PSA]: Secondary | ICD-10-CM | POA: Diagnosis not present

## 2016-03-27 DIAGNOSIS — R351 Nocturia: Secondary | ICD-10-CM | POA: Diagnosis not present

## 2016-03-27 DIAGNOSIS — M7522 Bicipital tendinitis, left shoulder: Secondary | ICD-10-CM | POA: Diagnosis not present

## 2016-03-27 DIAGNOSIS — M79622 Pain in left upper arm: Secondary | ICD-10-CM | POA: Diagnosis not present

## 2016-03-27 DIAGNOSIS — M25512 Pain in left shoulder: Secondary | ICD-10-CM | POA: Diagnosis not present

## 2016-03-27 DIAGNOSIS — E291 Testicular hypofunction: Secondary | ICD-10-CM | POA: Diagnosis not present

## 2016-03-27 DIAGNOSIS — M19012 Primary osteoarthritis, left shoulder: Secondary | ICD-10-CM | POA: Diagnosis not present

## 2016-03-29 DIAGNOSIS — M19012 Primary osteoarthritis, left shoulder: Secondary | ICD-10-CM | POA: Diagnosis not present

## 2016-03-29 DIAGNOSIS — M25512 Pain in left shoulder: Secondary | ICD-10-CM | POA: Diagnosis not present

## 2016-03-29 DIAGNOSIS — M79622 Pain in left upper arm: Secondary | ICD-10-CM | POA: Diagnosis not present

## 2016-03-29 DIAGNOSIS — M7522 Bicipital tendinitis, left shoulder: Secondary | ICD-10-CM | POA: Diagnosis not present

## 2016-04-02 DIAGNOSIS — M19012 Primary osteoarthritis, left shoulder: Secondary | ICD-10-CM | POA: Diagnosis not present

## 2016-04-02 DIAGNOSIS — M25512 Pain in left shoulder: Secondary | ICD-10-CM | POA: Diagnosis not present

## 2016-04-02 DIAGNOSIS — M79622 Pain in left upper arm: Secondary | ICD-10-CM | POA: Diagnosis not present

## 2016-04-02 DIAGNOSIS — M7522 Bicipital tendinitis, left shoulder: Secondary | ICD-10-CM | POA: Diagnosis not present

## 2016-04-04 DIAGNOSIS — M25512 Pain in left shoulder: Secondary | ICD-10-CM | POA: Diagnosis not present

## 2016-04-04 DIAGNOSIS — M19012 Primary osteoarthritis, left shoulder: Secondary | ICD-10-CM | POA: Diagnosis not present

## 2016-04-04 DIAGNOSIS — M7522 Bicipital tendinitis, left shoulder: Secondary | ICD-10-CM | POA: Diagnosis not present

## 2016-04-04 DIAGNOSIS — M79622 Pain in left upper arm: Secondary | ICD-10-CM | POA: Diagnosis not present

## 2016-04-08 DIAGNOSIS — M19012 Primary osteoarthritis, left shoulder: Secondary | ICD-10-CM | POA: Diagnosis not present

## 2016-04-11 DIAGNOSIS — D485 Neoplasm of uncertain behavior of skin: Secondary | ICD-10-CM | POA: Diagnosis not present

## 2016-04-11 DIAGNOSIS — L820 Inflamed seborrheic keratosis: Secondary | ICD-10-CM | POA: Diagnosis not present

## 2016-04-11 DIAGNOSIS — L821 Other seborrheic keratosis: Secondary | ICD-10-CM | POA: Diagnosis not present

## 2016-04-11 DIAGNOSIS — Z85820 Personal history of malignant melanoma of skin: Secondary | ICD-10-CM | POA: Diagnosis not present

## 2016-04-11 DIAGNOSIS — C44629 Squamous cell carcinoma of skin of left upper limb, including shoulder: Secondary | ICD-10-CM | POA: Diagnosis not present

## 2016-04-11 DIAGNOSIS — Z85828 Personal history of other malignant neoplasm of skin: Secondary | ICD-10-CM | POA: Diagnosis not present

## 2016-04-22 ENCOUNTER — Other Ambulatory Visit: Payer: Self-pay | Admitting: Cardiovascular Disease

## 2016-05-01 DIAGNOSIS — C44629 Squamous cell carcinoma of skin of left upper limb, including shoulder: Secondary | ICD-10-CM | POA: Diagnosis not present

## 2016-05-01 DIAGNOSIS — L988 Other specified disorders of the skin and subcutaneous tissue: Secondary | ICD-10-CM | POA: Diagnosis not present

## 2016-05-01 DIAGNOSIS — Z85828 Personal history of other malignant neoplasm of skin: Secondary | ICD-10-CM | POA: Diagnosis not present

## 2016-05-01 DIAGNOSIS — Z85820 Personal history of malignant melanoma of skin: Secondary | ICD-10-CM | POA: Diagnosis not present

## 2016-05-23 DIAGNOSIS — G4733 Obstructive sleep apnea (adult) (pediatric): Secondary | ICD-10-CM | POA: Diagnosis not present

## 2016-06-05 ENCOUNTER — Encounter: Payer: Self-pay | Admitting: Cardiovascular Disease

## 2016-06-05 ENCOUNTER — Ambulatory Visit (INDEPENDENT_AMBULATORY_CARE_PROVIDER_SITE_OTHER): Payer: Medicare HMO | Admitting: Cardiovascular Disease

## 2016-06-05 VITALS — BP 148/70 | HR 61 | Ht 72.0 in | Wt 212.0 lb

## 2016-06-05 DIAGNOSIS — Z7901 Long term (current) use of anticoagulants: Secondary | ICD-10-CM | POA: Diagnosis not present

## 2016-06-05 DIAGNOSIS — G4733 Obstructive sleep apnea (adult) (pediatric): Secondary | ICD-10-CM

## 2016-06-05 DIAGNOSIS — I480 Paroxysmal atrial fibrillation: Secondary | ICD-10-CM

## 2016-06-05 DIAGNOSIS — I10 Essential (primary) hypertension: Secondary | ICD-10-CM | POA: Diagnosis not present

## 2016-06-05 DIAGNOSIS — M25471 Effusion, right ankle: Secondary | ICD-10-CM | POA: Diagnosis not present

## 2016-06-05 DIAGNOSIS — Z9989 Dependence on other enabling machines and devices: Secondary | ICD-10-CM | POA: Diagnosis not present

## 2016-06-05 DIAGNOSIS — M25472 Effusion, left ankle: Secondary | ICD-10-CM

## 2016-06-05 NOTE — Patient Instructions (Addendum)
Your physician wants you to follow-up in: 6 months or sooner if needed. You will receive a reminder letter in the mail two months in advance. If you don't receive a letter, please call our office to schedule the follow-up appointment.  Your physician recommends that you return for lab work in: 6 months. You will receive lab orders when it's time to have these labs drawn.  If you need a refill on your cardiac medications before your next appointment, please call your pharmacy.

## 2016-06-05 NOTE — Progress Notes (Signed)
Patient ID: Mario [name], male   DOB: November 03, 1940, 76 y.o.   MRN: [id number]     Primary M.D.: Dr. Burnard Leigh   HPI:  Mario [name] is a 76 y.o. male who presents for a 6 month follow-up cardiology evaluation.  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  He underwent endoscopy by Dr. Melvia [name] and was noted to have an irregular heart rhythm with  sinus rhythm with PVCs and PACs when connected to the monitor.  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  He had undergone a cardiac catheterization in approximately 1985.  Last year  noticed chest tightness when working out and also had noticed significant increasing shortness of breath.  He has been aware of a somewhat irregular rhythm.  Over several months.  In July he was recently treated with diverticulitis with Cipro and Flagyl and had noticed increased palpitations at that time.  When I saw for initial evaluation on 09/27/2014 he was in atrial fibrillation which he was completely unaware of.  On further questioning, he omitted that he snores very loudly and has had significant episodes of witnessed apnea.  He can fall asleep during the day at any time if circumstances permit.  He has a history of hypertension.  Recently he had been taking atenolol 150 mg in the morning had taken testosterone injections once a week.  I changed his atenolol to 100 mg in the morning and 50 mg grams at night.  He was significantly hypertensive and I added amlodipine 5 mg to his medical regimen.  I scheduled him for a nuclear perfusion study which was done in 10/07/2014.  There were no ECG changes.  There was a small defect of mild severity in the basal inferior wall.  It was felt most likely this was diaphragmatic attenuation.  Ejection fraction was 59%.  An echo Doppler study revealed an EF of  50-55% and raise the possibility of mild posterior basal hypokinesis.  He had mild AR and mild MR.  He was started on eliquis at his office visit.  On follow-up evaluation, his blood pressure was still elevated.  I increased his amlodipine to 7.5 mg in the morning and also started him on losartan 50 mg daily.  He underwent a sleep study which confirm my suspicion for obstructive sleep apnea.  Overall sleep apnea was mild with an AHI of 12.3 , but events were moderate with supine sleep with an AHI of 22.8 per hour. He has significant oxygen desaturation to a nadir of 81% and on the initial study had reduced sleep efficiency at 61.8% and evidence for loud snoring.  He subsequently underwent a CPAP titration trial and he was titrated up to 8 cm water pressure with excellent result.  He underwent successful cardioversion for his atrial fibrillation  On 12/01/2014. He admits to one brief episode following the cardioversion while sleeping before CPAP therapy was initiated and this had reverted back to normal rhythm in the morning when he awakened  CPAP was initiated on November 14, 2014. He has a ResMed Airfit P10 nasalpillow mask, medium size. A download was reviewed from 12/12/2014 through 01/10/2015. His compliance is excellent at 100% of usage.  He is averaging 8 hours and 24 minutes.  His AHI was still mildly elevated at 6.5, but  significant improved initially.  There is no significant leak.  He had undergone biopsy of his laryngeal nodule, which was negative.  When I saw him in January 2017, he complained of several days of being back in atrial fibrillation which seem to be precipitated after prolonged sneezing spell.  When I saw him, with his recurrent AF and normal LV function.  I recommended initiation of flecanide at 50 mg twice a day.  He was advised to reduce his atenolol from 75 g twice a day to 50 mg twice a day for 2 days and on the third day to reduce his atenolol to 25 g twice a day.  After 2  doses of flecanide he felt that his heart rhythm had again stabilized.  A CPAP download  from January 24, 2015 through 02/19/2015 demonstrated 100% compliance.  He averaged 9 hours of sleep per night.  He is set at an auto mode and his 95th percentile pressure was 13.7 with a maximum 16.9.  AHI was 7.9.  Of note, one week ago when I saw the patient.  We discussed turning off his ramp since at the beginning of his sleep.  He was having some difficulty getting adequate air. I rechecked a download for the past week when the has been off, AHI was slightly improved at 5.8/hr.  He feels well.  He has more energy.  He is exercising regularly.  He believes his sleep is good quality.  He denies residual daytime sleepiness.  In April 2016 he had been under increased mental stress and felt that  he went back into atrial fibrillation for approximately 12 hours, but then ultimately his rhythm stabilized.  When I saw him 6 weeks ago, he was in sinus rhythm.  At that time I increased his flecainide to 75 mg twice a day.  I also scheduled him for routine treadmill test to make certain he did not have any flecanide induced proarrhythmia.  This did not demonstrate any exercise-induced arrhythmia.  He was noted to have upsloping ST segment depression of approximately 1 mm with rapid resolution in less than 1 minute in early recovery.  Previously, his nuclear perfusion study did not show any ischemia.  He feels that his mental stress has improved since his daughter seems to be reconciling with her husband and is no longer planning to go through a divorce.  He denies any chest pain or shortness of breath.    Since I last saw him , he is unaware of any breakthrough atrial fibrillation.  He continues to work hard and exercises rigorously at the gym.  He has noticed an occasional skipped beat, particularly when he is working excessively hard, Since he has been on the higher dose of flecanide 75 mg twice a day in addition to atenolol  25 mg twice a day .  He has not had recurrent AF.  He admits to 100% compliance with CPAP and states that starting on treatment was a "life changer. 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  He has been taking amlodipine 5 mg and losartan additionally for blood pressure control.  He denies bleeding on eliquis.He presents for reevaluation.   Past Medical History:  Diagnosis Date  . Arthritis    neck  . Dysrhythmia    afib - cardioverted 01/2015  . History of kidney stones   . Hypertension   .  Melanoma ([facility]) 2000   back; stage 3, squamous 1 - basal- several  . Neuromuscular disorder ([place])   . Phlebitis   . Pneumonia   . Seasonal allergies   . Shortness of breath dyspnea    with exertion  . Tubular adenoma of colon 2013   colonoscpy in 5 years [name]    Past Surgical History:  Procedure Laterality Date  . CARDIOVERSION N/A 12/01/2014   Procedure: CARDIOVERSION;  Surgeon: Troy Sine, MD;  Location: [facility];  Service: Cardiovascular;  Laterality: N/A;  . CATARACT EXTRACTION  2002, 2004   bilateral  . CYSTOSCOPY    . ELBOW SURGERY Left 1984   tendon repair left arm  . LITHOTRIPSY    . melanoma removal  2000   back  . 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 TENDON REPAIR Right 2012   right  . TONSILLECTOMY    . UPPER GI ENDOSCOPY      Allergies  Allergen Reactions  . Lisinopril     REACTION: severe GI upset requiring EGD--temporally related to lisinopril--tolerated micardis    Current Outpatient Prescriptions  Medication Sig Dispense Refill  . amLODipine (NORVASC) 5 MG tablet Take 1.5 tablets (7.5 mg total) by mouth daily. 135 tablet 2    . atenolol (TENORMIN) 25 MG tablet Take 25 mg by mouth 2 (two) times daily.    [name] ELIQUIS 5 MG TABS tablet TAKE ONE TABLET BY MOUTH TWICE DAILY 60 tablet 5  . flecainide (TAMBOCOR) 50 MG tablet Take 1.5 tablets (75 mg total) by mouth 2 (two) times daily. 180 tablet 6  . GLUCOSAMINE-CHONDROITIN PO Take 1 tablet by mouth daily.     . hydrochlorothiazide (MICROZIDE) 12.5 MG capsule Take 1 capsule (12.5 mg total) by mouth as needed (for swelling). 30 capsule 6  . losartan (COZAAR) 100 MG tablet TAKE ONE TABLET BY MOUTH ONCE DAILY 90 tablet 3  . Multiple Vitamin (MULTIVITAMIN) tablet Take 1 tablet by mouth daily.    . Saw Palmetto, Serenoa repens, (SAW PALMETTO PO) Take 1 tablet by mouth daily.    [name] testosterone cypionate (DEPOTESTOSTERONE CYPIONATE) 200 MG/ML injection Inject 100 mg into the muscle once a week.      No current facility-administered medications for this visit.     Social History   Social History  . Marital status: Married    Spouse name: N/A  . Number of children: 2  . Years of education: N/A   Occupational History  . retired    Social History Main Topics  . Smoking status: Former Smoker    Start date: 11/20/1976  . Smokeless tobacco: Never Used     Comment: quit smoking 1986 ish  . Alcohol use No     Comment: occ   . Drug use: No  . Sexual activity: Not on file   Other Topics Concern  . Not on file   Social History Narrative  . No narrative on file   Additional social history is notable in that he is married for 41 years.  He has 2 children and 2 grandchildren.  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  He completed 12th grade of education.  Remotely he had smoked but quit in 1977.  He drinks 3 beers per day.  He has been exercising fairly regularly at least 4 days per week including weightlifting and cardio for~ 2 hours per session.  Family History  Problem Relation Age of Onset  . Colon cancer Neg Hx   . Stomach cancer Neg Hx    . Heart disease Mother    Family history is notable that his mother died at age 40 with heart disease and had atrial fibrillation and congestive heart failure.  Father died at 98 with a brain tumor.  He has 2 sisters and both have hypertension.   ROS General: Negative; No fevers, chills, or night sweats HEENT: Negative; No changes in vision or hearing, sinus congestion, difficulty swallowing Pulmonary: Negative; No cough, wheezing, shortness of breath, hemoptysis Cardiovascular:  See HPI;  GI: Negative; No nausea, vomiting, diarrhea, or abdominal pain GU: Negative; No dysuria, hematuria, or difficulty voiding Musculoskeletal: Negative; no myalgias, joint pain, or weakness Hematologic/Oncologic: Negative; no easy bruising, bleeding Endocrine: Negative; no heat/cold intolerance; no diabetes Neuro: Negative; no changes in balance, headaches Skin: Negative; No rashes or skin lesions Psychiatric: Negative; No behavioral problems, depression Sleep: Positive for previously loud snoring, frequent awakenings, nonrestorative sleep, and daytime sleepiness, hypersomnolence, now on CPAP therapy with resolution of symptoms; no bruxism, restless legs, hypnogagnic hallucinations Other comprehensive 14 point system review is negative   Physical Exam BP (!) 148/70   Pulse 61   Ht 6' (1.829 m)   Wt 212 lb (96.2 kg)   BMI 28.75 kg/m   Repeat blood pressure by me 132/70  Wt Readings from Last 3 Encounters:  06/05/16 212 lb (96.2 kg)  11/21/15 216 lb 12.8 oz (98.3 kg)  06/30/15 214 lb 9.6 oz (97.3 kg)   General: Alert, oriented, no distress.  Skin: normal turgor, no rashes, warm and dry HEENT: Normocephalic, atraumatic. Pupils equal round and reactive to light; sclera anicteric; extraocular muscles intact; Fundi mild arterial narrowing Nose without nasal septal hypertrophy Mouth/Parynx benign; Mallinpatti scale 3 Neck: No JVD, no carotid bruits; normal carotid upstroke Lungs: clear to  ausculatation and percussion; no wheezing or rales Chest wall: without tenderness to palpitation Heart: PMI not displaced, rhythm is now regular with a ventricular rate in the 50's, s1 s2 normal, 1/6 systolic murmur, no diastolic murmur, no rubs, gallops, thrills, or heaves Abdomen: soft, nontender; no hepatosplenomehaly, BS+; abdominal aorta nontender and not dilated by palpation. Back: no CVA tenderness Pulses 2+ Musculoskeletal: full range of motion, normal strength, no joint deformities Extremities: Trivial left ankle edema no clubbing cyanosis, Homan's sign negative  Neurologic: grossly nonfocal; Cranial nerves grossly wnl Psychologic: Normal mood and affect  ECG (independently read by me): Normal sinus rhythm at 61 bpm.  Nonspecific T changes.  QTc interval 390 ms.  October 2017 ECG (independently read by me): Sinus bradycardia 57 bpm.  Nonspecific ST changes.  QTc interval 393 ms.  06/30/2015 ECG (independently read by me): Sinus bradycardia at 56 bpm.  No significant ST-T changes.  QTc interval 389 ms.  05/16/2015 ECG (independently read by me): Sinus bradycardia 59 bpm.  QTc interval 376 ms.  No significant ST changes.  02/23/2015 ECG (independently read by me): Sinus bradycardia 59 bpm.  QTc interval 390 ms.  Nonspecific T changes.  02/17/15 ECG (independently read by me): Atrial fibrillation at 58 bpm.  Nonspecific ST changes.  QTc interval 380 ms.  01/12/2015 ECG  (independently read by me):  Normal sinus rhythm at 62 bpm. Nonspecific ST-T changes.  September 2016 ECG (independently read by me): Atrial fibrillation with  a slow ventricular response in the 50s.  QTc interval 364 ms.  Prior CG (independently read by me):  Atrial fibrillation with ventricular rate at 52 bpm.  No significant ST-T change.  09/27/2014 ECG (independently read by me): Atrial fibrillation with ventricular rate at 59 bpm.  Nonspecific ST-T changes.  LABS:  BMP Latest Ref Rng & Units 11/28/2015 02/17/2015  01/25/2015  Glucose 65 - 99 mg/dL 84 75 102(H)  BUN 7 - 25 mg/dL _0 Creatinine 0.70 - 1.18 mg/dL 1.18 1.37(H) 1.16  Sodium 135 - 146 mmol/L 136 138 136  Potassium 3.5 - 5.3 mmol/L 4.7 4.6 4.6  Chloride 98 - 110 mmol/L 100 102 106  CO2 20 - 31 mmol/L _1 Calcium 8.6 - 10.3 mg/dL 9.1 9.8 9.4     Hepatic Function Latest Ref Rng & Units 11/28/2015 02/17/2015 10/21/2014  Total Protein 6.1 - 8.1 g/dL 6.6 6.8 6.8  Albumin 3.6 - 5.1 g/dL 3.8 4.1 4.0  AST 10 - 35 U/L _2 ALT 9 - 46 U/L 15 19 42  Alk Phosphatase 40 - 115 U/L 60 67 81  Total Bilirubin 0.2 - 1.2 mg/dL 0.7 0.9 1.1    CBC Latest Ref Rng & Units 11/28/2015 02/17/2015 01/25/2015  WBC 3.8 - 10.8 K/uL 5.0 8.9 5.5  Hemoglobin 13.2 - 17.1 g/dL 16.2 16.0 16.0  Hematocrit 38.5 - 50.0 % 48.0 45.3 45.8  Platelets 140 - 400 K/uL 198 208 175   Lab Results  Component Value Date   MCV 93.9 11/28/2015   MCV 89.2 02/17/2015   MCV 90.0 01/25/2015   Lab Results  Component Value Date   TSH 2.62 11/28/2015   Lab Results  Component Value Date   HGBA1C 4.9 05/06/2006     BNP No results found for: BNP  ProBNP No results found for: PROBNP   Lipid Panel     Component Value Date/Time   CHOL 148 11/28/2015 0943   TRIG 122 11/28/2015 0943   HDL 45 11/28/2015 0943   CHOLHDL 3.3 11/28/2015 0943   VLDL 24 11/28/2015 0943   LDLCALC 79 11/28/2015 0943    RADIOLOGY: No results found.  IMPRESSION:  1. Paroxysmal atrial fibrillation (HCC)   2. Anticoagulation adequate   3. OSA on CPAP   4. Essential hypertension   5. Swelling of both ankles     ASSESSMENT AND PLAN: Mario [name] is an active  76 year old male who was found to be in atrial fibrillation in August 2016 and was started on anticoagulation.  He has been on beta blocker therapy and was started on ARB therapy with losartan.  He has mild to moderate sleep apnea overall, but moderate sleep apnea during supine sleep with an AHI of 22.8 per hour.  He  feels significantly improved since initiating CPAP therapy and continues to have 100% compliance.  Presently, he feels he cannot sleep without CPAP and has noticed the marked benefit of treatment.  He underwent initial successful cardioversion on 12/01/2014 and in December after holding eliquis underwent successful biopsy of his laryngeal nodule which proved negative for malignancy.  He develop recurrent atrial fibrillation in January but ultimately converted to sinus rhythm with institution of flecainide 50 mg bid.  He again had another episode of short-lived PAF lasting less than 12 hours , which led to dose titration of flecainide to 75 mg twice a day. His routine treadmill test did not reveal any proarrhythmia; there was upsloping ST segment  depression with rapid resolution in recovery.  He is asymptomatic with reference to chest pain and a previous nuclear scan did not reveal ischemia.  He has been without any episodes of recurrent AF or awareness of palpitations on his increased flecanide medical regimen.  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  There are rare episodes of intermittent ankle swelling, particularly if he is on his feet long periods of time.  He has a prescription for HCTZ, which she has rarely needed to take.  I again reviewed his laboratory.  I will see him in 6 months for reevaluation, and prior to that office visit he will undergo a one-year follow-up blood work.  Time spent: 25 minutes  Troy Sine, MD, [facility] 06/07/2016 11:34 AM

## 2016-06-11 DIAGNOSIS — S70362A Insect bite (nonvenomous), left thigh, initial encounter: Secondary | ICD-10-CM | POA: Diagnosis not present

## 2016-06-11 DIAGNOSIS — A932 Colorado tick fever: Secondary | ICD-10-CM | POA: Diagnosis not present

## 2016-07-08 ENCOUNTER — Other Ambulatory Visit: Payer: Self-pay | Admitting: Cardiovascular Disease

## 2016-08-22 DIAGNOSIS — R69 Illness, unspecified: Secondary | ICD-10-CM | POA: Diagnosis not present

## 2016-08-26 DIAGNOSIS — G4733 Obstructive sleep apnea (adult) (pediatric): Secondary | ICD-10-CM | POA: Diagnosis not present

## 2016-08-28 ENCOUNTER — Encounter (HOSPITAL_COMMUNITY): Payer: Self-pay | Admitting: *Deleted

## 2016-08-28 ENCOUNTER — Emergency Department (HOSPITAL_COMMUNITY): Payer: Medicare HMO

## 2016-08-28 ENCOUNTER — Emergency Department (HOSPITAL_COMMUNITY): Payer: Medicare HMO | Admitting: Anesthesiology

## 2016-08-28 ENCOUNTER — Ambulatory Visit (HOSPITAL_COMMUNITY)
Admission: EM | Admit: 2016-08-28 | Discharge: 2016-08-28 | Disposition: A | Discharge status: Home / Self Care | Payer: Medicare HMO | Attending: Emergency Medicine | Admitting: Emergency Medicine

## 2016-08-28 ENCOUNTER — Encounter (HOSPITAL_COMMUNITY)
Admission: EM | Disposition: A | Discharge status: Home / Self Care | Payer: Self-pay | Source: Home / Self Care | Attending: Emergency Medicine

## 2016-08-28 DIAGNOSIS — I4891 Unspecified atrial fibrillation: Secondary | ICD-10-CM | POA: Insufficient documentation

## 2016-08-28 DIAGNOSIS — S68412A Complete traumatic amputation of left hand at wrist level, initial encounter: Secondary | ICD-10-CM | POA: Diagnosis not present

## 2016-08-28 DIAGNOSIS — S62635B Displaced fracture of distal phalanx of left ring finger, initial encounter for open fracture: Secondary | ICD-10-CM | POA: Diagnosis not present

## 2016-08-28 DIAGNOSIS — S61209A Unspecified open wound of unspecified finger without damage to nail, initial encounter: Secondary | ICD-10-CM

## 2016-08-28 DIAGNOSIS — Z87891 Personal history of nicotine dependence: Secondary | ICD-10-CM | POA: Insufficient documentation

## 2016-08-28 DIAGNOSIS — S68115A Complete traumatic metacarpophalangeal amputation of left ring finger, initial encounter: Secondary | ICD-10-CM | POA: Diagnosis not present

## 2016-08-28 DIAGNOSIS — Z888 Allergy status to other drugs, medicaments and biological substances status: Secondary | ICD-10-CM | POA: Diagnosis not present

## 2016-08-28 DIAGNOSIS — Y998 Other external cause status: Secondary | ICD-10-CM | POA: Diagnosis not present

## 2016-08-28 DIAGNOSIS — Z9989 Dependence on other enabling machines and devices: Secondary | ICD-10-CM | POA: Diagnosis not present

## 2016-08-28 DIAGNOSIS — Z87442 Personal history of urinary calculi: Secondary | ICD-10-CM | POA: Diagnosis not present

## 2016-08-28 DIAGNOSIS — Z7901 Long term (current) use of anticoagulants: Secondary | ICD-10-CM | POA: Insufficient documentation

## 2016-08-28 DIAGNOSIS — Z79899 Other long term (current) drug therapy: Secondary | ICD-10-CM | POA: Diagnosis not present

## 2016-08-28 DIAGNOSIS — G4733 Obstructive sleep apnea (adult) (pediatric): Secondary | ICD-10-CM | POA: Diagnosis not present

## 2016-08-28 DIAGNOSIS — S62665B Nondisplaced fracture of distal phalanx of left ring finger, initial encounter for open fracture: Secondary | ICD-10-CM

## 2016-08-28 DIAGNOSIS — S68125A Partial traumatic metacarpophalangeal amputation of left ring finger, initial encounter: Secondary | ICD-10-CM | POA: Diagnosis not present

## 2016-08-28 DIAGNOSIS — Z86010 Personal history of colonic polyps: Secondary | ICD-10-CM | POA: Insufficient documentation

## 2016-08-28 DIAGNOSIS — W298XXA Contact with other powered powered hand tools and household machinery, initial encounter: Secondary | ICD-10-CM | POA: Insufficient documentation

## 2016-08-28 DIAGNOSIS — Z8249 Family history of ischemic heart disease and other diseases of the circulatory system: Secondary | ICD-10-CM | POA: Insufficient documentation

## 2016-08-28 DIAGNOSIS — M47812 Spondylosis without myelopathy or radiculopathy, cervical region: Secondary | ICD-10-CM | POA: Diagnosis not present

## 2016-08-28 DIAGNOSIS — K5792 Diverticulitis of intestine, part unspecified, without perforation or abscess without bleeding: Secondary | ICD-10-CM | POA: Diagnosis not present

## 2016-08-28 DIAGNOSIS — I10 Essential (primary) hypertension: Secondary | ICD-10-CM | POA: Insufficient documentation

## 2016-08-28 DIAGNOSIS — Y92009 Unspecified place in unspecified non-institutional (private) residence as the place of occurrence of the external cause: Secondary | ICD-10-CM | POA: Insufficient documentation

## 2016-08-28 DIAGNOSIS — Z85820 Personal history of malignant melanoma of skin: Secondary | ICD-10-CM | POA: Insufficient documentation

## 2016-08-28 DIAGNOSIS — Y9389 Activity, other specified: Secondary | ICD-10-CM | POA: Insufficient documentation

## 2016-08-28 DIAGNOSIS — N200 Calculus of kidney: Secondary | ICD-10-CM | POA: Diagnosis not present

## 2016-08-28 DIAGNOSIS — S68625A Partial traumatic transphalangeal amputation of left ring finger, initial encounter: Secondary | ICD-10-CM | POA: Diagnosis not present

## 2016-08-28 HISTORY — PX: METACARPAL OSTEOTOMY: SHX5036

## 2016-08-28 HISTORY — PX: INCISION AND DRAINAGE OF WOUND: SHX1803

## 2016-08-28 LAB — BASIC METABOLIC PANEL
Anion gap: 8 (ref 5–15)
BUN: 12 mg/dL (ref 6–20)
CHLORIDE: 105 mmol/L (ref 101–111)
CO2: 22 mmol/L (ref 22–32)
CREATININE: 1.18 mg/dL (ref 0.61–1.24)
Calcium: 9 mg/dL (ref 8.9–10.3)
GFR calc non Af Amer: 59 mL/min — ABNORMAL LOW (ref >60)
GLUCOSE: 103 mg/dL — AB (ref 65–99)
Potassium: 4.2 mmol/L (ref 3.5–5.1)
Sodium: 135 mmol/L (ref 135–145)

## 2016-08-28 LAB — CBC
HCT: 47.6 % (ref 39.0–52.0)
Hemoglobin: 16.9 g/dL (ref 13.0–17.0)
MCH: 32.6 pg (ref 26.0–34.0)
MCHC: 35.5 g/dL (ref 30.0–36.0)
MCV: 91.9 fL (ref 78.0–100.0)
PLATELETS: 166 10*3/uL (ref 150–400)
RBC: 5.18 MIL/uL (ref 4.22–5.81)
RDW: 13.1 % (ref 11.5–15.5)
WBC: 9.1 10*3/uL (ref 4.0–10.5)

## 2016-08-28 LAB — PROTIME-INR
INR: 1.01
Prothrombin Time: 13.3 seconds (ref 11.4–15.2)

## 2016-08-28 SURGERY — IRRIGATION AND DEBRIDEMENT WOUND
Anesthesia: General | Site: Hand | Laterality: Left

## 2016-08-28 MED ORDER — CEFAZOLIN SODIUM-DEXTROSE 2-4 GM/100ML-% IV SOLN
2.0000 g | Freq: Once | INTRAVENOUS | Status: AC
  Administered 2016-08-28: 2 g via INTRAVENOUS
  Filled 2016-08-28: qty 100

## 2016-08-28 MED ORDER — CEFAZOLIN SODIUM-DEXTROSE 2-4 GM/100ML-% IV SOLN
INTRAVENOUS | Status: AC
  Filled 2016-08-28: qty 100

## 2016-08-28 MED ORDER — MORPHINE SULFATE (PF) 4 MG/ML IV SOLN
2.0000 mg | INTRAVENOUS | Status: DC | PRN
  Administered 2016-08-28: 2 mg via INTRAVENOUS
  Filled 2016-08-28: qty 1

## 2016-08-28 MED ORDER — FENTANYL CITRATE (PF) 100 MCG/2ML IJ SOLN: 25.0000 ug | INTRAMUSCULAR | Status: DC | PRN

## 2016-08-28 MED ORDER — MORPHINE SULFATE (PF) 4 MG/ML IV SOLN
2.0000 mg | Freq: Once | INTRAVENOUS | Status: AC
  Administered 2016-08-28: 2 mg via INTRAVENOUS
  Filled 2016-08-28: qty 1

## 2016-08-28 MED ORDER — 0.9 % SODIUM CHLORIDE (POUR BTL) OPTIME
TOPICAL | Status: DC | PRN
  Administered 2016-08-28: 1000 mL

## 2016-08-28 MED ORDER — OXYCODONE-ACETAMINOPHEN 5-325 MG PO TABS: 1.0000 | ORAL_TABLET | Freq: Three times a day (TID) | ORAL | 0 refills | Status: AC

## 2016-08-28 MED ORDER — CHLORHEXIDINE GLUCONATE 4 % EX LIQD: 60.0000 mL | Freq: Once | CUTANEOUS | Status: DC

## 2016-08-28 MED ORDER — PROPOFOL 10 MG/ML IV BOLUS
INTRAVENOUS | Status: DC | PRN
  Administered 2016-08-28: 50 mg via INTRAVENOUS
  Administered 2016-08-28: 200 mg via INTRAVENOUS

## 2016-08-28 MED ORDER — LACTATED RINGERS IV SOLN
INTRAVENOUS | Status: DC | PRN
  Administered 2016-08-28: 18:00:00 via INTRAVENOUS

## 2016-08-28 MED ORDER — BUPIVACAINE HCL 0.25 % IJ SOLN
5.0000 mL | Freq: Once | INTRAMUSCULAR | Status: AC
  Administered 2016-08-28: 5 mL
  Filled 2016-08-28: qty 5

## 2016-08-28 MED ORDER — OXYCODONE HCL 5 MG/5ML PO SOLN: 5.0000 mg | Freq: Once | ORAL | Status: DC | PRN

## 2016-08-28 MED ORDER — LACTATED RINGERS IV SOLN
INTRAVENOUS | Status: DC
  Administered 2016-08-28: 17:00:00 via INTRAVENOUS

## 2016-08-28 MED ORDER — CEFAZOLIN SODIUM-DEXTROSE 2-3 GM-% IV SOLR
INTRAVENOUS | Status: DC | PRN
  Administered 2016-08-28: 2 g via INTRAVENOUS

## 2016-08-28 MED ORDER — OXYCODONE HCL 5 MG PO TABS: 5.0000 mg | ORAL_TABLET | Freq: Once | ORAL | Status: DC | PRN

## 2016-08-28 MED ORDER — MORPHINE SULFATE (PF) 4 MG/ML IV SOLN
4.0000 mg | Freq: Once | INTRAVENOUS | Status: DC
Start: 2016-08-28 — End: 2016-08-28

## 2016-08-28 MED ORDER — SODIUM CHLORIDE 0.9 % IR SOLN
Status: DC | PRN
  Administered 2016-08-28: 3000 mL

## 2016-08-28 MED ORDER — SODIUM CHLORIDE 0.9 % IV BOLUS (SEPSIS)
1000.0000 mL | Freq: Once | INTRAVENOUS | Status: AC
  Administered 2016-08-28: 1000 mL via INTRAVENOUS

## 2016-08-28 MED ORDER — BUPIVACAINE HCL (PF) 0.25 % IJ SOLN
INTRAMUSCULAR | Status: AC
  Filled 2016-08-28: qty 30

## 2016-08-28 MED ORDER — CEPHALEXIN 500 MG PO CAPS: 500.0000 mg | ORAL_CAPSULE | Freq: Four times a day (QID) | ORAL | 0 refills | Status: AC

## 2016-08-28 MED ORDER — ONDANSETRON HCL 4 MG/2ML IJ SOLN: 4.0000 mg | Freq: Once | INTRAMUSCULAR | Status: DC | PRN

## 2016-08-28 MED ORDER — TETANUS-DIPHTH-ACELL PERTUSSIS 5-2.5-18.5 LF-MCG/0.5 IM SUSP
0.5000 mL | Freq: Once | INTRAMUSCULAR | Status: AC
  Administered 2016-08-28: 0.5 mL via INTRAMUSCULAR
  Filled 2016-08-28: qty 0.5

## 2016-08-28 SURGICAL SUPPLY — 44 items
BANDAGE ACE 3X5.8 VEL STRL LF (GAUZE/BANDAGES/DRESSINGS) IMPLANT
BANDAGE ACE 4X5 VEL STRL LF (GAUZE/BANDAGES/DRESSINGS) IMPLANT
BNDG COHESIVE 1X5 TAN STRL LF (GAUZE/BANDAGES/DRESSINGS) ×2 IMPLANT
BNDG CONFORM 2 STRL LF (GAUZE/BANDAGES/DRESSINGS) IMPLANT
BNDG ELASTIC 2X5.8 VLCR STR LF (GAUZE/BANDAGES/DRESSINGS) ×2 IMPLANT
BNDG GAUZE ELAST 4 BULKY (GAUZE/BANDAGES/DRESSINGS) ×2 IMPLANT
CORDS BIPOLAR (ELECTRODE) ×2 IMPLANT
COVER SURGICAL LIGHT HANDLE (MISCELLANEOUS) ×2 IMPLANT
CUFF TOURNIQUET SINGLE 18IN (TOURNIQUET CUFF) ×2 IMPLANT
CUFF TOURNIQUET SINGLE 24IN (TOURNIQUET CUFF) IMPLANT
DRAPE SURG 17X23 STRL (DRAPES) ×2 IMPLANT
DRSG ADAPTIC 3X8 NADH LF (GAUZE/BANDAGES/DRESSINGS) ×2 IMPLANT
GAUZE SPONGE 2X2 8PLY STRL LF (GAUZE/BANDAGES/DRESSINGS) IMPLANT
GAUZE SPONGE 4X4 12PLY STRL (GAUZE/BANDAGES/DRESSINGS) IMPLANT
GAUZE SPONGE 4X4 16PLY XRAY LF (GAUZE/BANDAGES/DRESSINGS) ×1 IMPLANT
GLOVE BIOGEL PI IND STRL 8.5 (GLOVE) ×1 IMPLANT
GLOVE BIOGEL PI INDICATOR 8.5 (GLOVE) ×1
GLOVE SURG ORTHO 8.0 STRL STRW (GLOVE) ×2 IMPLANT
GOWN STRL REUS W/ TWL LRG LVL3 (GOWN DISPOSABLE) ×2 IMPLANT
GOWN STRL REUS W/ TWL XL LVL3 (GOWN DISPOSABLE) ×1 IMPLANT
GOWN STRL REUS W/TWL LRG LVL3 (GOWN DISPOSABLE) ×4
GOWN STRL REUS W/TWL XL LVL3 (GOWN DISPOSABLE) ×2
KIT BASIN OR (CUSTOM PROCEDURE TRAY) ×2 IMPLANT
KIT ROOM TURNOVER OR (KITS) ×2 IMPLANT
MANIFOLD NEPTUNE II (INSTRUMENTS) ×2 IMPLANT
NDL HYPO 25GX1X1/2 BEV (NEEDLE) IMPLANT
NEEDLE HYPO 25GX1X1/2 BEV (NEEDLE) IMPLANT
NS IRRIG 1000ML POUR BTL (IV SOLUTION) ×2 IMPLANT
PACK ORTHO EXTREMITY (CUSTOM PROCEDURE TRAY) ×2 IMPLANT
PAD ARMBOARD 7.5X6 YLW CONV (MISCELLANEOUS) ×4 IMPLANT
PAD CAST 4YDX4 CTTN HI CHSV (CAST SUPPLIES) IMPLANT
PADDING CAST COTTON 4X4 STRL (CAST SUPPLIES)
SOAP 2 % CHG 4 OZ (WOUND CARE) ×2 IMPLANT
SPECIMEN JAR SMALL (MISCELLANEOUS) ×2 IMPLANT
SPONGE GAUZE 2X2 STER 10/PKG (GAUZE/BANDAGES/DRESSINGS)
SUCTION FRAZIER HANDLE 10FR (MISCELLANEOUS)
SUCTION TUBE FRAZIER 10FR DISP (MISCELLANEOUS) IMPLANT
SUT MERSILENE 4 0 P 3 (SUTURE) IMPLANT
SUT PROLENE 4 0 PS 2 18 (SUTURE) IMPLANT
SYR CONTROL 10ML LL (SYRINGE) IMPLANT
TOWEL OR 17X24 6PK STRL BLUE (TOWEL DISPOSABLE) ×2 IMPLANT
TOWEL OR 17X26 10 PK STRL BLUE (TOWEL DISPOSABLE) ×2 IMPLANT
TUBE CONNECTING 12X1/4 (SUCTIONS) IMPLANT
WATER STERILE IRR 1000ML POUR (IV SOLUTION) ×2 IMPLANT

## 2016-08-28 NOTE — Anesthesia Preprocedure Evaluation (Signed)
Anesthesia Evaluation  Patient identified by MRN, date of birth, ID band Patient awake    Reviewed: Allergy & Precautions, NPO status , Patient's Chart, lab work & pertinent test results  Airway Mallampati: II  TM Distance: >3 FB Neck ROM: Full    Dental  (+) Teeth Intact, Dental Advisory Given   Pulmonary former smoker,    breath sounds clear to auscultation       Cardiovascular hypertension,  Rhythm:Regular Rate:Normal     Neuro/Psych    GI/Hepatic   Endo/Other    Renal/GU      Musculoskeletal   Abdominal   Peds  Hematology   Anesthesia Other Findings   Reproductive/Obstetrics                             Anesthesia Physical Anesthesia Plan  ASA: III and emergent  Anesthesia Plan: General   Post-op Pain Management:    Induction: Intravenous  PONV Risk Score and Plan: Ondansetron and Dexamethasone  Airway Management Planned: LMA  Additional Equipment:   Intra-op Plan:   Post-operative Plan:   Informed Consent: I have reviewed the patients History and Physical, chart, labs and discussed the procedure including the risks, benefits and alternatives for the proposed anesthesia with the patient or authorized representative who has indicated his/her understanding and acceptance.   Dental advisory given  Plan Discussed with: CRNA and Anesthesiologist  Anesthesia Plan Comments:         Anesthesia Quick Evaluation

## 2016-08-28 NOTE — ED Triage Notes (Signed)
Pt reports cutting left ring finger on log splitter today. 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 Pt has cloth on wound pta, bleeding controlled. Did not remove the cloth due to patient's severe pain and was near syncopal at check-in. Pt is on blood thinners.

## 2016-08-28 NOTE — Anesthesia Procedure Notes (Signed)
Procedure Name: LMA Insertion Date/Time: 08/28/2016 6:42 PM Performed by: Eligha Bridegroom Pre-anesthesia Checklist: Patient identified, Emergency Drugs available, Suction available, Patient being monitored and Timeout performed Patient Re-evaluated:Patient Re-evaluated prior to induction Oxygen Delivery Method: Circle system utilized Preoxygenation: Pre-oxygenation with 100% oxygen Induction Type: IV induction LMA Size: 4.0 Number of attempts: 1 Placement Confirmation: positive ETCO2 and breath sounds checked- equal and bilateral Tube secured with: Tape Dental Injury: Teeth and Oropharynx as per pre-operative assessment

## 2016-08-28 NOTE — H&P (Signed)
Mario [name] is an 76 y.o. male.   Chief Complaint: Finger avulsion HPI: Mario [name] was splitting logs with a wood splitter when his glove got caught and his left ring finger got pinched between the wood and the machine. He came to the ED for evaluation. X-rays showed avulsion of the distal 40% of the distal phalanx. He is RHD.  Past Medical History:  Diagnosis Date  . Arthritis    neck  . Dysrhythmia    afib - cardioverted 01/2015  . History of kidney stones   . Hypertension   . Melanoma ([place]) 2000   back; stage 3, squamous 1 - basal- several  . Neuromuscular disorder ([place])   . Phlebitis   . Pneumonia   . Seasonal allergies   . Shortness of breath dyspnea    with exertion  . Tubular adenoma of colon 2013   colonoscpy in 5 years [name]    Past Surgical History:  Procedure Laterality Date  . CARDIOVERSION N/A 12/01/2014   Procedure: CARDIOVERSION;  Surgeon: Troy Sine, MD;  Location: [place] [facility];  Service: Cardiovascular;  Laterality: N/A;  . CATARACT EXTRACTION  2002, 2004   bilateral  . CYSTOSCOPY    . ELBOW SURGERY Left 1984   tendon repair left arm  . LITHOTRIPSY    . melanoma removal  2000   back  . MICROLARYNGOSCOPY WITH LASER N/A 01/25/2015   Procedure: MICROLARYNGOSCOPY ;  Surgeon: Melida Quitter, MD;  Location: [place];  Service: ENT;  Laterality: N/A;  Microlaryngoscopy with 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 with CO2 laser   . TENDON REPAIR Right 2012   right  . TONSILLECTOMY    . UPPER GI ENDOSCOPY      Family History  Problem Relation Age of Onset  . Heart disease Mother   . Colon cancer Neg Hx   . Stomach cancer Neg Hx    Social History:  reports that he has quit smoking. He started smoking about 39 years ago. He has never used smokeless tobacco. He reports that he does not drink alcohol or use drugs.  Allergies:  Allergies  Allergen Reactions  . Lisinopril     REACTION: severe GI upset requiring EGD--temporally related to  lisinopril--tolerated micardis     (Not in a hospital admission)  No results found for this or any previous visit (from the past 48 hour(s)). Dg Finger Ring Left  Result Date: 08/28/2016 CLINICAL DATA:  Finger tip amputation left fourth finger with log splitter. EXAM: LEFT RING FINGER 2+V COMPARISON:  None. FINDINGS: Metallic ring obscures views of the proximal left fourth finger. There has been amputation of the distal left fourth finger at the level of the distal tuft of the distal phalanx including the surrounding soft tissues. No additional fracture. No dislocation. No significant arthropathy. No suspicious focal osseous lesion. No additional radiopaque foreign body. IMPRESSION: Amputation of the distal left fourth finger at the level of the distal tuft of the distal phalanx including the surrounding soft tissues. Electronically Signed   By: Mario [name] M.D.   On: 08/28/2016 13:11    Review of Systems  Constitutional: Negative for weight loss.  HENT: Negative for ear discharge, ear pain, hearing loss and tinnitus.   Eyes: Negative for blurred vision, double vision, photophobia and pain.  Respiratory: Negative for cough, sputum production and shortness of breath.   Cardiovascular: Negative for chest pain.  Gastrointestinal: Negative for abdominal pain, nausea and vomiting.  Genitourinary: Negative for dysuria, flank pain,  frequency and urgency.  Musculoskeletal: Positive for joint pain (Left ring finger). Negative for back pain, falls, myalgias and neck pain.  Neurological: Negative for dizziness, tingling, sensory change, focal weakness, loss of consciousness and headaches.  Endo/Heme/Allergies: Does not bruise/bleed easily.  Psychiatric/Behavioral: Negative for depression, memory loss and substance abuse. The patient is not nervous/anxious.     Blood pressure (!) 147/68, pulse 69, temperature 98.2 F (36.8 C), temperature source Oral, resp. rate 18, SpO2 97 %. Physical Exam   Constitutional: He appears well-developed and well-nourished. No distress.  HENT:  Head: Normocephalic.  Eyes: Conjunctivae are normal. Right eye exhibits no discharge. Left eye exhibits no discharge. No scleral icterus.  Cardiovascular: Normal rate and regular rhythm.   Respiratory: Effort normal. No respiratory distress.  Musculoskeletal:  Left shoulder, elbow, wrist, digits- tip of ring finger amputated, does not cross DIP joint, phalanx protruding, hemostatic, no instability, no blocks to motion  Sens  Ax/R/M/U intact  Mot   Ax/ R/ PIN/ M/ AIN/ U intact  Rad 2+   Neurological: He is alert.  Skin: Skin is warm and dry. He is not diaphoretic.  Psychiatric: He has a normal mood and affect. His behavior is normal.     Assessment/Plan Left ring finger avulsion with open distal phalanx fx -- Prophylactic abx and tetanus given. Will need to go to OR for I&D and revision amputation by Dr. Caralyn Guile. NPO until then.  The patient was seen and evaluated. The note was reviewed as above. The patient will be taken the operating room for revision amputation. A signed informed consent was obtained. R/B/A DISCUSSED WITH PT IN OFFICE.  PT VOICED UNDERSTANDING OF PLAN CONSENT SIGNED DAY OF SURGERY PT SEEN AND EXAMINED PRIOR TO OPERATIVE PROCEDURE/DAY OF SURGERY SITE MARKED. QUESTIONS ANSWERED WILL GO HOME FOLLOWING SURGERY  WE ARE PLANNING SURGERY FOR YOUR UPPER EXTREMITY. THE RISKS AND BENEFITS OF SURGERY INCLUDE BUT NOT LIMITED TO BLEEDING INFECTION, DAMAGE TO NEARBY NERVES ARTERIES TENDONS, FAILURE OF SURGERY TO ACCOMPLISH ITS INTENDED GOALS, PERSISTENT SYMPTOMS AND NEED FOR FURTHER SURGICAL INTERVENTION. WITH THIS IN MIND WE WILL PROCEED. I HAVE DISCUSSED WITH THE PATIENT THE PRE AND POSTOPERATIVE REGIMEN AND THE DOS AND DON'TS. PT VOICED UNDERSTANDING AND INFORMED CONSENT SIGNED.    Lisette Abu, PA-C Orthopedic Surgery [phone] 08/28/2016, 1:33 PM

## 2016-08-28 NOTE — ED Provider Notes (Signed)
[facility] DEPT Provider Note   CSN: [id number] Arrival date & time: 08/28/16  1134  By signing my name below, I, Mario [name], attest that this documentation has been prepared under the direction and in the presence of Mario Manifold, MD. Electronically Signed: Mayer [name], Scribe. 08/28/16. 11:56 AM. History   Chief Complaint Chief Complaint  Patient presents with  . Laceration  The history is provided by the patient. No language interpreter was used.    HPI Comments: Mario [name] is a 76 y.o. male who presents to the Emergency Department complaining of constant, gradually worsening, throbbing left-sided middle finger pain that occurred prior to arrival. Pt states he was using a log splitter when his glove got stuck inside it and cut his finger. Pt takes eliquis for Afib. Tetanus unknown if UTD.  Past Medical History:  Diagnosis Date  . Arthritis    neck  . Dysrhythmia    afib - cardioverted 01/2015  . History of kidney stones   . Hypertension   . Melanoma ([place]) 2000   back; stage 3, squamous 1 - basal- several  . Neuromuscular disorder ([place])   . Phlebitis   . Pneumonia   . Seasonal allergies   . Shortness of breath dyspnea    with exertion  . Tubular adenoma of colon 2013   colonoscpy in 5 years [name]    Patient Active Problem List   Diagnosis Date Noted  . Swelling of both ankles 07/02/2015  . 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 OSA on CPAP 01/12/2015  . Anticoagulation adequate 11/03/2014  . Atrial fibrillation ([place]) 09/29/2014  . History of colonic polyps 09/20/2014  . 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 Stricture and stenosis of esophagus 06/20/2008  . 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 HYPERTENSION, BENIGN 05/06/2006  . ELEVATED PROSTATE SPECIFIC ANTIGEN 05/06/2006  . Melanoma of skin, site unspecified 04/10/2006  . CATARACT 04/10/2006  . Diverticulitis of colon 04/10/2006  . NEPHROLITHIASIS  04/10/2006    Past Surgical History:  Procedure Laterality Date  . CARDIOVERSION N/A 12/01/2014   Procedure: CARDIOVERSION;  Surgeon: Troy Sine, MD;  Location: [place];  Service: Cardiovascular;  Laterality: N/A;  . CATARACT EXTRACTION  2002, 2004   bilateral  . CYSTOSCOPY    . ELBOW SURGERY Left 1984   tendon repair left arm  . LITHOTRIPSY    . melanoma removal  2000   back  . MICROLARYNGOSCOPY WITH LASER N/A 01/25/2015   Procedure: MICROLARYNGOSCOPY ;  Surgeon: Melida Quitter, MD;  Location: [facility];  Service: ENT;  Laterality: N/A;  Microlaryngoscopy with 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 with CO2 laser   . TENDON REPAIR Right 2012   right  . TONSILLECTOMY    . UPPER GI ENDOSCOPY         Home Medications    Prior to Admission medications   Medication Sig Start Date End Date Taking? Authorizing Provider  amLODipine (NORVASC) 5 MG tablet Take 1.5 tablets (7.5 mg total) by mouth daily. 02/23/16   Troy Sine, MD  atenolol (TENORMIN) 25 MG tablet Take 25 mg by mouth 2 (two) times daily.    [provider]  ELIQUIS 5 MG TABS tablet TAKE ONE TABLET BY MOUTH TWICE DAILY 04/23/16   Troy Sine, MD  flecainide (TAMBOCOR) 50 MG tablet TAKE ONE & ONE-HALF TABLETS BY MOUTH TWICE DAILY 07/09/16   Troy Sine, MD  GLUCOSAMINE-CHONDROITIN PO Take 1 tablet by  mouth daily.     [provider]  hydrochlorothiazide (MICROZIDE) 12.5 MG capsule Take 1 capsule (12.5 mg total) by mouth as needed (for swelling). 06/30/15   Troy Sine, MD  losartan (COZAAR) 100 MG tablet TAKE ONE TABLET BY MOUTH ONCE DAILY 10/09/15   Troy Sine, MD  Multiple Vitamin (MULTIVITAMIN) tablet Take 1 tablet by mouth daily.    [provider]  Saw Palmetto, Serenoa repens, (SAW PALMETTO PO) Take 1 tablet by mouth daily.    [provider]  testosterone cypionate (DEPOTESTOSTERONE CYPIONATE) 200 MG/ML injection Inject 100 mg into the muscle once a week.   08/09/14   [provider]    Family History Family History  Problem Relation Age of Onset  . Heart disease Mother   . Colon cancer Neg Hx   . Stomach cancer Neg Hx     Social History Social History  Substance Use Topics  . Smoking status: Former Smoker    Start date: 11/20/1976  . Smokeless tobacco: Never Used     Comment: quit smoking 1986 ish  . Alcohol use No     Comment: occ      Allergies   Lisinopril   Review of Systems Review of Systems  Skin: Positive for wound.   A complete 10 system review of systems was obtained and all systems are negative except as noted in the HPI and PMH.   Physical Exam Updated Vital Signs BP (!) 162/71 (BP Location: Right Arm)   Pulse 65   Temp 98.2 F (36.8 C) (Oral)   Resp 20   SpO2 96%   Physical Exam  Constitutional: He is oriented to person, place, and time. He appears well-developed and well-nourished. No distress.  HENT:  Head: Normocephalic and atraumatic.  Mouth/Throat: Oropharynx is clear and moist. No oropharyngeal exudate.  Eyes: Pupils are equal, round, and reactive to light. Conjunctivae and EOM are normal.  Neck: Normal range of motion. Neck supple.  No meningismus.  Cardiovascular: Normal rate, regular rhythm, normal heart sounds and intact distal pulses.   No murmur heard. Pulmonary/Chest: Effort normal and breath sounds normal. No respiratory distress.  Abdominal: Soft. There is no tenderness. There is no rebound and no guarding.  Musculoskeletal: Normal range of motion. He exhibits no edema or tenderness.  Left ring finger: amputation through distal phalanx. Laceration on dorsal side is right near level of proximal nail fold. There is more soft tissue loss extending a little more proximally on the volar surface. No active bleeding.   Neurological: He is alert and oriented to person, place, and time. No cranial nerve deficit. He exhibits normal muscle tone. Coordination normal.   5/5 strength  throughout. CN 2-12 intact.Equal grip strength.   Skin: Skin is warm.  Psychiatric: He has a normal mood and affect. His behavior is normal.  Nursing note and vitals reviewed.    ED Treatments / Results  DIAGNOSTIC STUDIES: Oxygen Saturation is 96% on RA, normal by my interpretation.    COORDINATION OF CARE: 11:55 AM Discussed treatment plan with pt at bedside and pt agreed to plan.  Labs (all labs ordered are listed, but only abnormal results are displayed) Labs Reviewed - No data to display  EKG  EKG Interpretation None       Radiology Dg Finger Ring Left  Result Date: 08/28/2016 CLINICAL DATA:  Finger tip amputation left fourth finger with log splitter. EXAM: LEFT RING FINGER 2+V COMPARISON:  None. FINDINGS: Metallic ring obscures views  of the proximal left fourth finger. There has been amputation of the distal left fourth finger at the level of the distal tuft of the distal phalanx including the surrounding soft tissues. No additional fracture. No dislocation. No significant arthropathy. No suspicious focal osseous lesion. No additional radiopaque foreign body. IMPRESSION: Amputation of the distal left fourth finger at the level of the distal tuft of the distal phalanx including the surrounding soft tissues. Electronically Signed   By: Ilona Sorrel M.D.   On: 08/28/2016 13:11    Procedures Procedures (including critical care time)  NERVE BLOCK Performed by: Mario [name] Consent: Verbal consent obtained. Required items: required blood products, implants, devices, and special equipment available Time out: Immediately prior to procedure a "time out" was called to verify the correct patient, procedure, equipment, support staff and site/side marked as required.  Indication: pain relief/exam/irrigation  Nerve block body site: L index finger  Preparation: Patient was prepped and draped in the usual sterile fashion. Needle gauge: 27 G Location technique: digital block.  anatomical landmarks  Anesthetic: 0.25 Marcaine  Anesthetic total: 1.5 ml  Outcome: pain improved Patient tolerance: Patient tolerated the procedure well with no immediate complications.   Medications Ordered in ED Medications  bupivacaine (MARCAINE) 0.25 % (with pres) injection 5 mL (not administered)     Initial Impression / Assessment and Plan / ED Course  I have reviewed the triage vital signs and the nursing notes.  Pertinent labs & imaging results that were available during my care of the patient were reviewed by me and considered in my medical decision making (see chart for details).  Clinical Course as of Aug 28 1157  Wed Aug 28, 2016  1157 Assessed when he got back to treatment room. Brief initial exam show partial amp of distal finger L middle finger. Will give digital block. Imaging. Update tetanus and give some ancef. Will consult hand surgery after imaging obtained.   [SK]    Clinical Course User Index [SK] Mario Manifold, MD    [phone] with avulsion/amputation distal L ring finger. Digital block. Wound irrigated. Abx. Hand surgery consult.   Final Clinical Impressions(s) / ED Diagnoses   Final diagnoses:  Avulsion of finger, initial encounter  Open nondisplaced fracture of distal phalanx of left ring finger, initial encounter    New Prescriptions New Prescriptions   No medications on file    I personally preformed the services scribed in my presence. The recorded information has been reviewed is accurate. Mario Manifold, MD.     Mario Manifold, MD 08/28/16 1350

## 2016-08-28 NOTE — Anesthesia Postprocedure Evaluation (Signed)
Anesthesia Post Note  Patient: Mario [name]  Procedure(s) Performed: Procedure(s) (LRB): IRRIGATION AND DEBRIDEMENT WOUND (Left) 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. (Left)     Patient location during evaluation: PACU Anesthesia Type: General Level of consciousness: awake Pain management: pain level controlled Vital Signs Assessment: post-procedure vital signs reviewed and stable Respiratory status: spontaneous breathing Cardiovascular status: stable Anesthetic complications: no    Last Vitals:  Vitals:   08/28/16 1935 08/28/16 1950  BP: 137/71 137/73  Pulse: 71 63  Resp: 15 15  Temp:  36.5 C    Last Pain:  Vitals:   08/28/16 1950  TempSrc:   PainSc: 0-No pain                 Brooklynne Pereida

## 2016-08-28 NOTE — Op Note (Signed)
PREOPERATIVE DIAGNOSIS: Left ring finger open distal phalanx fracture with soft tissue loss  POSTOPERATIVE DIAGNOSIS: Same  ATTENDING SURGEON: Dr. Gavin Pound who was scrubbed and present for the entire procedure  ASSISTANT SURGEON: None  ANESTHESIA: Gen.  OPERATIVE PROCEDURE: Debridement of skin subcutaneous tissue muscle and bone left ring finger excisional debridement for open fracture Revision amputation left ring finger with local neurectomies and advancement flap closure  IMPLANTS: None  RADIOGRAPHIC INTERPRETATION: None  SURGICAL INDICATIONS: The patient is a 76 year old right-hand-dominant gentleman who was working at home he got his ring finger caught and amputated the ring finger out around the region of the distal interphalangeal joint. Patient was seen and evaluated in the hospital and recommended undergo the above procedure. Risks of surgery include but not limited to bleeding infection damage to nearby nerves arteries or tendons loss of motion of the wrists and digits incomplete relief of symptoms phantom pain and need for further surgical intervention  SURGICAL TECHNIQUE: Patient was properly identified in the preoperative holding area and a mark with a permanent marker made on the left ring finger to indicate correct operative site. Patient brought back to operating room placed supine on anesthesia and table where general anesthesia was administered. Patient tolerated this well. A well-padded tourniquet placed on the left brachium and sealed with the appropriate drape. Left upper extremities and prepped and draped in normal sterile fashion. A timeout was called cracks I was then 5 the procedure then begun. Attention then turned to the open distal phalanx fracture. Excisional debridement of skin subcutaneous tissue and bone was then carried out. This was done sharply with knife and sharp scissors. The wound was then thoroughly irrigated. Completion amputation was then done through  the level of the distal interphalangeal joint. The wound was irrigated skin flaps were then advanced and a V-Y fashion advancement flap closure was needed to cover the soft tissue defect. Local neurectomies had been completed. FDP tendon was allowed to retract proximally. Skin was then reapproximated after advancement flaps with Prolene suture. Adaptic dressing and a sterile compressive bandage then applied. Patient tolerated the procedure well. 5 mL quarter percent Marcaine infiltrated in the flexor sheath for local block.  POSTOPERATIVE PLAN: Patient is to be discharged to home seen back in the office in approximately 10 days for wound check x-rays sutures likely out at the 3 week mark. Radiographs at each visit. Oral pain medications and antibiotics prescribed

## 2016-08-28 NOTE — Transfer of Care (Signed)
Immediate Anesthesia Transfer of Care Note  Patient: Mario [name]  Procedure(s) Performed: Procedure(s): IRRIGATION AND DEBRIDEMENT WOUND (Left) 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. (Left)  Patient Location: PACU  Anesthesia Type:General  Level of Consciousness: awake, alert  and oriented  Airway & Oxygen Therapy: Patient Spontanous Breathing  Post-op Assessment: Report given to RN  Post vital signs: Reviewed and stable  Last Vitals:  Vitals:   08/28/16 1530 08/28/16 1919  BP: 137/72 133/88  Pulse: 68 64  Resp:  13  Temp:  36.5 C    Last Pain:  Vitals:   08/28/16 1919  TempSrc:   PainSc: 0-No pain         Complications: No apparent anesthesia complications

## 2016-08-28 NOTE — Discharge Instructions (Signed)
KEEP BANDAGE CLEAN AND DRY CALL OFFICE FOR F/U APPT [phone] in 10 days KEEP HAND ELEVATED ABOVE HEART OK TO APPLY ICE TO OPERATIVE AREA CONTACT OFFICE IF ANY WORSENING PAIN OR CONCERNS.  

## 2016-08-29 ENCOUNTER — Encounter (HOSPITAL_COMMUNITY): Payer: Self-pay | Admitting: Orthopedic Surgery

## 2016-09-09 DIAGNOSIS — S62635D Displaced fracture of distal phalanx of left ring finger, subsequent encounter for fracture with routine healing: Secondary | ICD-10-CM | POA: Diagnosis not present

## 2016-09-18 DIAGNOSIS — R972 Elevated prostate specific antigen [PSA]: Secondary | ICD-10-CM | POA: Diagnosis not present

## 2016-09-19 DIAGNOSIS — S62635D Displaced fracture of distal phalanx of left ring finger, subsequent encounter for fracture with routine healing: Secondary | ICD-10-CM | POA: Diagnosis not present

## 2016-09-25 DIAGNOSIS — E291 Testicular hypofunction: Secondary | ICD-10-CM | POA: Diagnosis not present

## 2016-09-25 DIAGNOSIS — R972 Elevated prostate specific antigen [PSA]: Secondary | ICD-10-CM | POA: Diagnosis not present

## 2016-10-07 ENCOUNTER — Other Ambulatory Visit: Payer: Self-pay | Admitting: Cardiovascular Disease

## 2016-10-09 NOTE — Telephone Encounter (Signed)
Rx(s) sent to pharmacy electronically.  

## 2016-10-10 DIAGNOSIS — S62635D Displaced fracture of distal phalanx of left ring finger, subsequent encounter for fracture with routine healing: Secondary | ICD-10-CM | POA: Diagnosis not present

## 2016-10-15 DIAGNOSIS — B351 Tinea unguium: Secondary | ICD-10-CM | POA: Diagnosis not present

## 2016-10-15 DIAGNOSIS — Z85820 Personal history of malignant melanoma of skin: Secondary | ICD-10-CM | POA: Diagnosis not present

## 2016-10-15 DIAGNOSIS — C4441 Basal cell carcinoma of skin of scalp and neck: Secondary | ICD-10-CM | POA: Diagnosis not present

## 2016-10-15 DIAGNOSIS — C44619 Basal cell carcinoma of skin of left upper limb, including shoulder: Secondary | ICD-10-CM | POA: Diagnosis not present

## 2016-10-15 DIAGNOSIS — Z85828 Personal history of other malignant neoplasm of skin: Secondary | ICD-10-CM | POA: Diagnosis not present

## 2016-10-15 DIAGNOSIS — D485 Neoplasm of uncertain behavior of skin: Secondary | ICD-10-CM | POA: Diagnosis not present

## 2016-10-15 DIAGNOSIS — L821 Other seborrheic keratosis: Secondary | ICD-10-CM | POA: Diagnosis not present

## 2016-10-15 DIAGNOSIS — D234 Other benign neoplasm of skin of scalp and neck: Secondary | ICD-10-CM | POA: Diagnosis not present

## 2016-10-29 ENCOUNTER — Other Ambulatory Visit: Payer: Self-pay | Admitting: Cardiovascular Disease

## 2016-10-30 ENCOUNTER — Encounter: Payer: Self-pay | Admitting: Gastroenterology

## 2016-11-01 ENCOUNTER — Telehealth: Payer: Self-pay | Admitting: Cardiovascular Disease

## 2016-11-01 NOTE — Telephone Encounter (Signed)
Returned the call to the patient. He stated that he cannot afford the Eliquis anymore. He would like to know what the alternatives are for him.     Medication Samples have been provided to the patient.  Drug name: Eliquis Strength: 5 mg Qty: 3 boxes   LOT: [id number] Exp.Date: 12/20

## 2016-11-01 NOTE — Telephone Encounter (Signed)
New message   Pt states that he is calling about some of his medications that a nurse needs to help him with.

## 2016-11-04 NOTE — Telephone Encounter (Signed)
Left message to call back with recommendations °

## 2016-11-04 NOTE — Telephone Encounter (Signed)
Ideally, would prefer that he either stay on Effient or Xarelto.  If he cannot afford any of these, then he should be transitioned to warfarin, but will require frequent PT assessment

## 2016-11-04 NOTE — Telephone Encounter (Signed)
Returned the call to the patient. He stated that he would stay on the eliquis for now. It will be cheaper in January for him.

## 2016-11-07 DIAGNOSIS — S62635D Displaced fracture of distal phalanx of left ring finger, subsequent encounter for fracture with routine healing: Secondary | ICD-10-CM | POA: Diagnosis not present

## 2016-11-11 DIAGNOSIS — H52203 Unspecified astigmatism, bilateral: Secondary | ICD-10-CM | POA: Diagnosis not present

## 2016-11-11 DIAGNOSIS — Z961 Presence of intraocular lens: Secondary | ICD-10-CM | POA: Diagnosis not present

## 2016-11-18 ENCOUNTER — Other Ambulatory Visit: Payer: Self-pay | Admitting: Cardiovascular Disease

## 2016-12-02 DIAGNOSIS — G4733 Obstructive sleep apnea (adult) (pediatric): Secondary | ICD-10-CM | POA: Diagnosis not present

## 2016-12-03 ENCOUNTER — Other Ambulatory Visit: Payer: Self-pay | Admitting: Cardiovascular Disease

## 2016-12-03 NOTE — Telephone Encounter (Signed)
REFILL 

## 2016-12-05 ENCOUNTER — Encounter: Payer: Self-pay | Admitting: Cardiovascular Disease

## 2016-12-05 ENCOUNTER — Ambulatory Visit (INDEPENDENT_AMBULATORY_CARE_PROVIDER_SITE_OTHER): Payer: Medicare HMO | Admitting: Cardiovascular Disease

## 2016-12-05 VITALS — BP 137/71 | HR 56 | Ht 72.0 in | Wt 213.6 lb

## 2016-12-05 DIAGNOSIS — Z9989 Dependence on other enabling machines and devices: Secondary | ICD-10-CM

## 2016-12-05 DIAGNOSIS — I10 Essential (primary) hypertension: Secondary | ICD-10-CM | POA: Diagnosis not present

## 2016-12-05 DIAGNOSIS — Z7901 Long term (current) use of anticoagulants: Secondary | ICD-10-CM | POA: Diagnosis not present

## 2016-12-05 DIAGNOSIS — I480 Paroxysmal atrial fibrillation: Secondary | ICD-10-CM | POA: Diagnosis not present

## 2016-12-05 DIAGNOSIS — G4733 Obstructive sleep apnea (adult) (pediatric): Secondary | ICD-10-CM | POA: Diagnosis not present

## 2016-12-05 NOTE — Patient Instructions (Signed)
Medication Instructions:  Your physician recommends that you continue on your current medications as directed. Please refer to the Current Medication list given to you today.  Labwork: Please return for FASTING labs in 6 months (CMET, CBC, Lipid, TSH)  Our in office lab hours are Monday-Friday 8:00-4:30, closed for lunch 1-2 pm.  No appointment needed.  Follow-Up: Your physician wants you to follow-up in: 6 months with Dr. Claiborne Billings. You will receive a reminder letter in the mail two months in advance. If you don't receive a letter, please call our office to schedule the follow-up appointment.   Any Other Special Instructions Will Be Listed Below (If Applicable).     If you need a refill on your cardiac medications before your next appointment, please call your pharmacy.

## 2016-12-05 NOTE — Progress Notes (Signed)
Patient ID: Mario [name], male   DOB: Oct 20, 1940, 76 y.o.   MRN: [id number]     Primary M.D.: Dr. Mallie Mussel   HPI:  Mario [name] is a 76 y.o. male who presents for a 6 month follow-up cardiology evaluation.  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  He underwent endoscopy by Dr. Erskine Emery and was noted to have an irregular heart rhythm with  sinus rhythm with PVCs and PACs when connected to the monitor.  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  He had undergone a cardiac catheterization in approximately 1985.  Last year  noticed chest tightness when working out and also had noticed significant increasing shortness of breath.  He has been aware of a somewhat irregular rhythm.  Over several months.  In July he was recently treated with diverticulitis with Cipro and Flagyl and had noticed increased palpitations at that time.  When I saw for initial evaluation on 09/27/2014 he was in atrial fibrillation which he was completely unaware of.  On further questioning, he omitted that he snores very loudly and has had significant episodes of witnessed apnea.  He can fall asleep during the day at any time if circumstances permit.  He has a history of hypertension.  Recently he had been taking atenolol 150 mg in the morning had taken testosterone injections once a week.  I changed his atenolol to 100 mg in the morning and 50 mg grams at night.  He was significantly hypertensive and I added amlodipine 5 mg to his medical regimen.  I scheduled him for a nuclear perfusion study which was done in 10/07/2014.  There were no ECG changes.  There was a small defect of mild severity in the basal inferior wall.  It was felt most likely this was diaphragmatic attenuation.  Ejection fraction was 59%.  An echo Doppler study revealed an EF of  50-55% and raise the possibility of mild posterior basal hypokinesis.  He had mild AR and mild MR.  He was started on eliquis at his office visit.  On follow-up evaluation, his blood pressure was still elevated.  I increased his amlodipine to 7.5 mg in the morning and also started him on losartan 50 mg daily.  He underwent a sleep study which confirm my suspicion for obstructive sleep apnea.  Overall sleep apnea was mild with an AHI of 12.3 , but events were moderate with supine sleep with an AHI of 22.8 per hour. He has significant oxygen desaturation to a nadir of 81% and on the initial study had reduced sleep efficiency at 61.8% and evidence for loud snoring.  He subsequently underwent a CPAP titration trial and he was titrated up to 8 cm water pressure with excellent result.  He underwent successful cardioversion for his atrial fibrillation  On 12/01/2014. He admits to one brief episode following the cardioversion while sleeping before CPAP therapy was initiated and this had reverted back to normal rhythm in the morning when he awakened  CPAP was initiated on November 14, 2014. He has a ResMed Airfit P10 nasalpillow mask, medium size. A download was reviewed from 12/12/2014 through 01/10/2015. His compliance is excellent at 100% of usage.  He is averaging 8 hours and 24 minutes.  His AHI was still mildly elevated at 6.5, but  significant improved initially.  There is no significant leak.  He had undergone biopsy of his laryngeal nodule, which was negative.  When I saw him in January 2017, he complained of several days of being back in atrial fibrillation which seem to be precipitated after prolonged sneezing spell.  When I saw him, with his recurrent AF and normal LV function.  I recommended initiation of flecanide at 50 mg twice a day.  He was advised to reduce his atenolol from 75 g twice a day to 50 mg twice a day for 2 days and on the third day to reduce his atenolol to 25 g twice a day.  After 2  doses of flecanide he felt that his heart rhythm had again stabilized.  A CPAP download  from January 24, 2015 through 02/19/2015 demonstrated 100% compliance.  He averaged 9 hours of sleep per night.  He is set at an auto mode and his 95th percentile pressure was 13.7 with a maximum 16.9.  AHI was 7.9.  Of note, one week ago when I saw the patient.  We discussed turning off his ramp since at the beginning of his sleep.  He was having some difficulty getting adequate air. I rechecked a download for the past week when the has been off, AHI was slightly improved at 5.8/hr.  He feels well.  He has more energy.  He is exercising regularly.  He believes his sleep is good quality.  He denies residual daytime sleepiness.  In April 2016 he had been under increased mental stress and felt that  he went back into atrial fibrillation for approximately 12 hours, but then ultimately his rhythm stabilized.  When I saw him 6 weeks ago, he was in sinus rhythm.  At that time I increased his flecainide to 75 mg twice a day.  I also scheduled him for routine treadmill test to make certain he did not have any flecanide induced proarrhythmia.  This did not demonstrate any exercise-induced arrhythmia.  He was noted to have upsloping ST segment depression of approximately 1 mm with rapid resolution in less than 1 minute in early recovery.  Previously, his nuclear perfusion study did not show any ischemia.  He feels that his mental stress has improved since his daughter seems to be reconciling with her husband and is no longer planning to go through a divorce.  He denies any chest pain or shortness of breath.    Since I last saw him in April 2018, he is unaware of any recurrent atrial fibrillation, although at times he experiences an occasional skipped beat.  He continues to be on flecainide 75 mg twice a in low-dose atenolol.  He was cutting wood and accidentally cut off the distal aspect of his fourth finger of his left hand  several months ago.  He also had been taking testosterone prescribed by his urologist, but recently his hemoglobin had increased and he reduced his testosterone dose in half.  He continues to be on eliquis for anticoagulation and denies bleeding.  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 " .  He presents for evaluation   Past Medical History:  Diagnosis Date  . Arthritis    neck  . Dysrhythmia    afib - cardioverted 01/2015  . History of kidney stones   . Hypertension   . Melanoma ([place]) 2000   back; stage 3, squamous 1 - basal- several  . Neuromuscular disorder ([place])   .  Phlebitis   . Pneumonia   . Seasonal allergies   . Shortness of breath dyspnea    with exertion  . Tubular adenoma of colon 2013   colonoscpy in 5 years [name]    Past Surgical History:  Procedure Laterality Date  . CARDIOVERSION N/A 12/01/2014   Procedure: CARDIOVERSION;  Surgeon: Troy Sine, MD;  Location: [facility];  Service: Cardiovascular;  Laterality: N/A;  . CATARACT EXTRACTION  2002, 2004   bilateral  . CYSTOSCOPY    . ELBOW SURGERY Left 1984   tendon repair left arm  . INCISION AND DRAINAGE OF WOUND Left 08/28/2016   Procedure: IRRIGATION AND DEBRIDEMENT WOUND;  Surgeon: Iran Planas, MD;  Location: [facility];  Service: Orthopedics;  Laterality: Left;  . LITHOTRIPSY    . melanoma removal  2000   back  . METACARPAL OSTEOTOMY Left 08/28/2016   Procedure: 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.;  Surgeon: Iran Planas, MD;  Location: [facility];  Service: Orthopedics;  Laterality: Left;  [name] MICROLARYNGOSCOPY WITH LASER N/A 01/25/2015   Procedure: MICROLARYNGOSCOPY ;  Surgeon: Melida Quitter, MD;  Location: [facility];  Service: ENT;  Laterality: N/A;  Microlaryngoscopy with 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 with CO2 laser   . TENDON REPAIR Right 2012   right  . TONSILLECTOMY    . UPPER GI ENDOSCOPY      Allergies  Allergen Reactions  .  Lisinopril     REACTION: severe GI upset requiring EGD--temporally related to lisinopril--tolerated micardis    Current Outpatient Prescriptions  Medication Sig Dispense Refill  . amLODipine (NORVASC) 5 MG tablet TAKE ONE & ONE-HALF TABLETS BY MOUTH ONCE DAILY 135 tablet 2  . atenolol (TENORMIN) 25 MG tablet Take 6.25 mg by mouth 2 (two) times daily.     [name] ELIQUIS 5 MG TABS tablet TAKE 1 TABLET BY MOUTH TWICE DAILY 180 tablet 1  . flecainide (TAMBOCOR) 50 MG tablet TAKE ONE & ONE-HALF TABLETS BY MOUTH TWICE DAILY (Patient taking differently: TAKE ONE & ONE-HALF TABLETS (75m) BY MOUTH TWICE DAILY) 180 tablet 3  . GLUCOSAMINE-CHONDROITIN PO Take 1 tablet by mouth daily.     . hydrochlorothiazide (MICROZIDE) 12.5 MG capsule Take 1 capsule (12.5 mg total) by mouth as needed (for swelling). 30 capsule 6  . losartan (COZAAR) 100 MG tablet TAKE ONE TABLET BY MOUTH ONCE DAILY 90 tablet 3  . Multiple Vitamin (MULTIVITAMIN) tablet Take 1 tablet by mouth daily.    . Saw Palmetto, Serenoa repens, (SAW PALMETTO PO) Take 1 tablet by mouth daily.    .Marland Kitchentestosterone cypionate (DEPOTESTOSTERONE CYPIONATE) 200 MG/ML injection Inject 100 mg into the muscle once a week.      No current facility-administered medications for this visit.     Social History   Social History  . Marital status: Married    Spouse name: N/A  . Number of children: 2  . Years of education: N/A   Occupational History  . retired    Social History Main Topics  . Smoking status: Former Smoker    Start date: 11/20/1976  . Smokeless tobacco: Never Used     Comment: quit smoking 1986 ish  . Alcohol use No     Comment: occ   . Drug use: No  . Sexual activity: Not on file   Other Topics Concern  . Not on file   Social History Narrative  . No narrative on file   Additional social history is notable in that  he is married for 41 years.  He has 2 children and 2 grandchildren.  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  He completed 12th grade of education.  Remotely he had smoked but quit in 1977.  He drinks 3 beers per day.  He has been exercising fairly regularly at least 4 days per week including weightlifting and cardio for~ 2 hours per session.  Family History  Problem Relation Age of Onset  . Heart disease Mother   . Colon cancer Neg Hx   . Stomach cancer Neg Hx    Family history is notable that his mother died at age 97 with heart disease and had atrial fibrillation and congestive heart failure.  Father died at 12 with a brain tumor.  He has 2 sisters and both have hypertension.   ROS General: Negative; No fevers, chills, or night sweats HEENT: Negative; No changes in vision or hearing, sinus congestion, difficulty swallowing Pulmonary: Negative; No cough, wheezing, shortness of breath, hemoptysis Cardiovascular:  See HPI;  GI: Negative; No nausea, vomiting, diarrhea, or abdominal pain GU: Negative; No dysuria, hematuria, or difficulty voiding Musculoskeletal: Recent traumatic amputation of the distal aspect of his fourth finger of his left hand due to a sawing accident Hematologic/Oncologic: Negative; no easy bruising, bleeding Endocrine: Negative; no heat/cold intolerance; no diabetes Neuro: Negative; no changes in balance, headaches Skin: Negative; No rashes or skin lesions Psychiatric: Negative; No behavioral problems, depression Sleep: Positive for previously loud snoring, frequent awakenings, nonrestorative sleep, and daytime sleepiness, hypersomnolence, now on CPAP therapy with resolution of symptoms; no bruxism, restless legs, hypnogagnic hallucinations Other comprehensive 14 point system review is negative   Physical Exam BP 137/71   Pulse (!) 56   Ht 6' (1.829 m)   Wt 213 lb 9.6 oz (96.9 kg)   BMI 28.97 kg/m    Repeat blood pressure by me 128/70  Wt Readings from Last 3 Encounters:  12/05/16 213 lb 9.6 oz (96.9 kg)  06/05/16 212 lb (96.2 kg)  11/21/15  216 lb 12.8 oz (98.3 kg)     Physical Exam BP 137/71   Pulse (!) 56   Ht 6' (1.829 m)   Wt 213 lb 9.6 oz (96.9 kg)   BMI 28.97 kg/m  General: Alert, oriented, no distress.  Skin: normal turgor, no rashes, warm and dry HEENT: Normocephalic, atraumatic. Pupils equal round and reactive to light; sclera anicteric; extraocular muscles intact; Fundi not done today, but previously noted to have arterial narrowing Nose without nasal septal hypertrophy Mouth/Parynx benign; Mallinpatti scale 3 Neck: No JVD, no carotid bruits; normal carotid upstroke Lungs: clear to ausculatation and percussion; no wheezing or rales Chest wall: without tenderness to palpitation Heart: PMI not displaced, RRR; no ectopy; s1 s2 normal, 1/6 systolic murmur, no diastolic murmur, no rubs, gallops, thrills, or heaves Abdomen: soft, nontender; no hepatosplenomehaly, BS+; abdominal aorta nontender and not dilated by palpation. Back: no CVA tenderness Pulses 2+ Musculoskeletal: Recent traumatic amputation of the distal aspect of his left hand fourth finger Extremities: no clubbing cyanosis or edema, Homan's sign negative  Neurologic: grossly nonfocal; Cranial nerves grossly wnl Psychologic: Normal mood and affect   ECG (independently read by me): Sinus bradycardia 56 bpm.  No ectopy.  Normal intervals per  06/05/2016 ECG (independently read by me): Normal sinus rhythm at 61 bpm.  Nonspecific T changes.  QTc interval 390 ms.  October 2017 ECG (independently read by me): Sinus bradycardia 57 bpm.  Nonspecific ST changes.  QTc interval 393 ms.  06/30/2015 ECG (independently read by me): Sinus bradycardia at 56 bpm.  No significant ST-T changes.  QTc interval 389 ms.  05/16/2015 ECG (independently read by me): Sinus bradycardia 59 bpm.  QTc interval 376 ms.  No significant ST changes.  02/23/2015 ECG (independently read by me): Sinus bradycardia 59 bpm.  QTc interval 390 ms.  Nonspecific T changes.  02/17/15 ECG  (independently read by me): Atrial fibrillation at 58 bpm.  Nonspecific ST changes.  QTc interval 380 ms.  01/12/2015 ECG  (independently read by me):  Normal sinus rhythm at 62 bpm. Nonspecific ST-T changes.  September 2016 ECG (independently read by me): Atrial fibrillation with a slow ventricular response in the 50s.  QTc interval 364 ms.  Prior CG (independently read by me):  Atrial fibrillation with ventricular rate at 52 bpm.  No significant ST-T change.  09/27/2014 ECG (independently read by me): Atrial fibrillation with ventricular rate at 59 bpm.  Nonspecific ST-T changes.  LABS:  BMP Latest Ref Rng & Units 08/28/2016 11/28/2015 02/17/2015  Glucose 65 - 99 mg/dL 103(H) 84 75  BUN 6 - 20 mg/dL '12 18 24  ' Creatinine 0.61 - 1.24 mg/dL 1.18 1.18 1.37(H)  Sodium 135 - 145 mmol/L 135 136 138  Potassium 3.5 - 5.1 mmol/L 4.2 4.7 4.6  Chloride 101 - 111 mmol/L 105 100 102  CO2 22 - 32 mmol/L '22 25 26  ' Calcium 8.9 - 10.3 mg/dL 9.0 9.1 9.8     Hepatic Function Latest Ref Rng & Units 11/28/2015 02/17/2015 10/21/2014  Total Protein 6.1 - 8.1 g/dL 6.6 6.8 6.8  Albumin 3.6 - 5.1 g/dL 3.8 4.1 4.0  AST 10 - 35 U/L '25 20 31  ' ALT 9 - 46 U/L 15 19 42  Alk Phosphatase 40 - 115 U/L 60 67 81  Total Bilirubin 0.2 - 1.2 mg/dL 0.7 0.9 1.1    CBC Latest Ref Rng & Units 08/28/2016 11/28/2015 02/17/2015  WBC 4.0 - 10.5 K/uL 9.1 5.0 8.9  Hemoglobin 13.0 - 17.0 g/dL 16.9 16.2 16.0  Hematocrit 39.0 - 52.0 % 47.6 48.0 45.3  Platelets 150 - 400 K/uL 166 198 208   Lab Results  Component Value Date   MCV 91.9 08/28/2016   MCV 93.9 11/28/2015   MCV 89.2 02/17/2015   Lab Results  Component Value Date   TSH 2.62 11/28/2015   Lab Results  Component Value Date   HGBA1C 4.9 05/06/2006     BNP No results found for: BNP  ProBNP No results found for: PROBNP   Lipid Panel     Component Value Date/Time   CHOL 148 11/28/2015 0943   TRIG 122 11/28/2015 0943   HDL 45 11/28/2015 0943   CHOLHDL 3.3  11/28/2015 0943   VLDL 24 11/28/2015 0943   LDLCALC 79 11/28/2015 0943    RADIOLOGY: No results found.  IMPRESSION:  1. PAF (paroxysmal atrial fibrillation) ([place])   2. Essential hypertension   3. OSA on CPAP   4. Anticoagulation adequate     ASSESSMENT AND PLAN: Mr. Mario [name] is an active young appearing 76 year old male who was found to be in atrial fibrillation in August 2016 and was started on anticoagulation.  He had undergone initial cardioversion in October 2016 and develop recurrent atrial fibrillation in January 2017 converted to sinus rhythm with institution of fleck and 9.  He is maintaining sinus rhythm on flecainide 75 mg twice a day in addition to low-dose beta blocker therapy with  atenolol 12.5 mg.  His blood pressure today is well controlled and he continues to take losartan 100 mg, amlodipine 7.5 mg in addition to low-dose atenolol.  He has a prescription for HCTZ, but has not been taking this since.  He's not had edema.  He recently was found to have increased hemoglobin levels.  He has been on testosterone prescribed by his urologist.  He has decreased his testosterone dose in half since his hemoglobin had increased greater than 17.  He denies bleeding and is tolerating eliquis 5 mg twice a day.  He continues to exercise vigorously, particularly with weight training.  His CPAP is well treated and he is 100% compliant.  I have recommended a complete set of fasting laboratory.  I will see him in 6 months for reevaluation.   Time spent: 25 minutes Troy Sine, MD, [facility] 12/07/2016 12:00 PM

## 2017-01-08 ENCOUNTER — Encounter: Payer: Self-pay | Admitting: Gastroenterology

## 2017-01-08 ENCOUNTER — Ambulatory Visit: Payer: Medicare HMO | Admitting: Gastroenterology

## 2017-01-08 ENCOUNTER — Telehealth: Payer: Self-pay

## 2017-01-08 ENCOUNTER — Encounter (INDEPENDENT_AMBULATORY_CARE_PROVIDER_SITE_OTHER): Payer: Self-pay

## 2017-01-08 VITALS — BP 114/60 | HR 64 | Ht 70.25 in | Wt 208.5 lb

## 2017-01-08 DIAGNOSIS — Z86010 Personal history of colonic polyps: Secondary | ICD-10-CM | POA: Diagnosis not present

## 2017-01-08 DIAGNOSIS — I480 Paroxysmal atrial fibrillation: Secondary | ICD-10-CM

## 2017-01-08 DIAGNOSIS — Z7901 Long term (current) use of anticoagulants: Secondary | ICD-10-CM

## 2017-01-08 MED ORDER — NA SULFATE-K SULFATE-MG SULF 17.5-3.13-1.6 GM/177ML PO SOLN: 1.0000 | Freq: Once | ORAL | 0 refills | Status: AC

## 2017-01-08 NOTE — Telephone Encounter (Signed)
01/08/2017   RE: Mario [name] DOB: 12-03-1940 MRN: [id number]  We have scheduled the above patient for an endoscopic procedure (colonoscopy). Our records show that he is on anticoagulation therapy.   Please advise as to how long the patient may come off his therapy of Eliquis prior to the procedure, which is scheduled for 02-18-2017.  Please route back to [facility].

## 2017-01-08 NOTE — Telephone Encounter (Signed)
Pt takes Eliquis for afib with CHADS2VASc score of 3 (HTN, age - 2). Renal function is normal. Ok to hold Eliquis for 1-2 days prior to procedure. Clearance routed to [facility], [place].

## 2017-01-08 NOTE — Progress Notes (Signed)
GI Progress Note  Chief Complaint: History of colon polyps  Subjective  History:  This is a 76 year old man last seen by Dr. Deatra Ina in August 2016 for dysphagia. Per endoscopy was performed at that time. The patient also had a colonoscopy in October 2013 worried diminutive tubular adenoma was removed. He denies any change in bowel habits or rectal bleeding. He denies abdominal pain or dysphagia. His appetite is good and weight stable. Mario [name] has atrial fibrillation for which he is followed by Dr. Claiborne Billings, and I read the most recent cardiology office note from October 25. Cardioversion did not give a lasting effect, but he is maintained an regular rhythm on flecainide and is on chronic anticoagulation with Eliquis. He has had no stroke or TIA in the past.  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 ROS: Cardiovascular:  no chest pain Respiratory: no dyspnea  The patient's Past Medical, Family and Social History were reviewed and are on file in the EMR.  Objective:  Med list reviewed  Current Outpatient Medications:  .  amLODipine (NORVASC) 5 MG tablet, TAKE ONE & ONE-HALF TABLETS BY MOUTH ONCE DAILY, Disp: 135 tablet, Rfl: 2 .  atenolol (TENORMIN) 25 MG tablet, Take 6.25 mg by mouth 2 (two) times daily. , Disp: , Rfl:  .  ELIQUIS 5 MG TABS tablet, TAKE 1 TABLET BY MOUTH TWICE DAILY, Disp: 180 tablet, Rfl: 1 .  flecainide (TAMBOCOR) 50 MG tablet, TAKE ONE & ONE-HALF TABLETS BY MOUTH TWICE DAILY (Patient taking differently: TAKE ONE & ONE-HALF TABLETS (87m) BY MOUTH TWICE DAILY), Disp: 180 tablet, Rfl: 3 .  GLUCOSAMINE-CHONDROITIN PO, Take 1 tablet by mouth daily. , Disp: , Rfl:  .  hydrochlorothiazide (MICROZIDE) 12.5 MG capsule, Take 1 capsule (12.5 mg total) by mouth as needed (for swelling)., Disp: 30 capsule, Rfl: 6 .  losartan (COZAAR) 100 MG tablet, TAKE ONE TABLET BY MOUTH ONCE DAILY, Disp: 90 tablet, Rfl: 3 .  Multiple Vitamin (MULTIVITAMIN) tablet,  Take 1 tablet by mouth daily., Disp: , Rfl:  .  Saw Palmetto, Serenoa repens, (SAW PALMETTO PO), Take 1 tablet by mouth daily., Disp: , Rfl:  .  testosterone cypionate (DEPOTESTOSTERONE CYPIONATE) 200 MG/ML injection, Inject 50 mg into the muscle once a week. , Disp: , Rfl:  .  Na Sulfate-K Sulfate-Mg Sulf 17.5-3.13-1.6 GM/177ML SOLN, Take 1 kit by mouth once for 1 dose., Disp: 354 mL, Rfl: 0   Vital signs in last 24 hrs: Vitals:   01/08/17 1047  BP: 114/60  Pulse: 64    Physical Exam  Appears younger than stated age and in excellent health  HEENT: sclera anicteric, oral mucosa moist without lesions  Neck: supple, no thyromegaly, JVD or lymphadenopathy  Cardiac: RRR without murmurs, S1S2 heard, no peripheral edema  Pulm: clear to auscultation bilaterally, normal RR and effort noted  Abdomen: soft, no tenderness, with active bowel sounds. No guarding or palpable hepatosplenomegaly.  Skin; warm and dry, no jaundice or rash   _0 @ Assessment: Encounter Diagnoses  Name Primary?  . History of colonic polyps Yes  . Paroxysmal atrial fibrillation (HCC)   . Current use of long term anticoagulation    I advised Mario [name] to have a surveillance colonoscopy for his history of adenomatous polyp, and I believe he is quite well enough to undergo that. He is agreeable after discussion of the procedure and risks.  The benefits and risks of the planned procedure were described in detail with the  patient or (when appropriate) their health care proxy.  Risks were outlined as including, but not limited to, bleeding, infection, perforation, adverse medication reaction leading to cardiac or pulmonary decompensation, or pancreatitis (if ERCP).  The limitation of incomplete mucosal visualization was also discussed.  No guarantees or warranties were given.  He understands there is a small risk of TIA or stroke while off his Eliquis 2 days prior to the procedure and perhaps up to one or 2  days afterwards depending upon what if any polyps are removed. He understands and is agreeable. We will get a message to Dr. Claiborne Billings to make sure he agrees with this plan as well.   Total time 15 minutes, over half spent in counseling and coordination of care.   Nelida Meuse III

## 2017-01-08 NOTE — Telephone Encounter (Signed)
Pt notified and aware. He states clear understanding to hold his Eliquis 2 days prior to his scheduled colonoscopy date.

## 2017-01-08 NOTE — Patient Instructions (Addendum)
If you are age 76 or older, your body mass index should be between 23-30. Your Body mass index is 29.7 kg/m. If this is out of the aforementioned range listed, please consider follow up with your Primary Care Provider.  If you are age 54 or younger, your body mass index should be between 19-25. Your Body mass index is 29.7 kg/m. If this is out of the aformentioned range listed, please consider follow up with your Primary Care Provider.   You have been scheduled for a colonoscopy. Please follow written instructions given to you at your visit today.  Please pick up your prep supplies at the pharmacy within the next 1-3 days. If you use inhalers (even only as needed), please bring them with you on the day of your procedure. Your physician has requested that you go to www.startemmi.com and enter the access code given to you at your visit today. This web site gives a general overview about your procedure. However, you should still follow specific instructions given to you by our office regarding your preparation for the procedure.  You will be contacted by our office prior to your procedure for directions on holding your Plavix.  If you do not hear from our office 1 week prior to your scheduled procedure, please call [phone] to discuss.   Thank you for choosing [facility]  Dr Wilfrid Lund III

## 2017-01-14 DIAGNOSIS — D1801 Hemangioma of skin and subcutaneous tissue: Secondary | ICD-10-CM | POA: Diagnosis not present

## 2017-01-14 DIAGNOSIS — Z85820 Personal history of malignant melanoma of skin: Secondary | ICD-10-CM | POA: Diagnosis not present

## 2017-01-14 DIAGNOSIS — C4441 Basal cell carcinoma of skin of scalp and neck: Secondary | ICD-10-CM | POA: Diagnosis not present

## 2017-01-14 DIAGNOSIS — L565 Disseminated superficial actinic porokeratosis (DSAP): Secondary | ICD-10-CM | POA: Diagnosis not present

## 2017-01-14 DIAGNOSIS — D485 Neoplasm of uncertain behavior of skin: Secondary | ICD-10-CM | POA: Diagnosis not present

## 2017-01-14 DIAGNOSIS — L821 Other seborrheic keratosis: Secondary | ICD-10-CM | POA: Diagnosis not present

## 2017-01-14 DIAGNOSIS — Z85828 Personal history of other malignant neoplasm of skin: Secondary | ICD-10-CM | POA: Diagnosis not present

## 2017-01-14 DIAGNOSIS — L570 Actinic keratosis: Secondary | ICD-10-CM | POA: Diagnosis not present

## 2017-01-23 DIAGNOSIS — R69 Illness, unspecified: Secondary | ICD-10-CM | POA: Diagnosis not present

## 2017-02-09 HISTORY — PX: BASAL CELL CARCINOMA EXCISION: SHX1214

## 2017-02-10 DIAGNOSIS — Z85820 Personal history of malignant melanoma of skin: Secondary | ICD-10-CM | POA: Diagnosis not present

## 2017-02-10 DIAGNOSIS — Z85828 Personal history of other malignant neoplasm of skin: Secondary | ICD-10-CM | POA: Diagnosis not present

## 2017-02-10 DIAGNOSIS — C44319 Basal cell carcinoma of skin of other parts of face: Secondary | ICD-10-CM | POA: Diagnosis not present

## 2017-02-12 ENCOUNTER — Encounter: Payer: Self-pay | Admitting: Gastroenterology

## 2017-02-18 ENCOUNTER — Other Ambulatory Visit: Payer: Self-pay

## 2017-02-18 ENCOUNTER — Ambulatory Visit (AMBULATORY_SURGERY_CENTER): Payer: Medicare HMO | Admitting: Gastroenterology

## 2017-02-18 ENCOUNTER — Encounter: Payer: Self-pay | Admitting: Gastroenterology

## 2017-02-18 VITALS — BP 83/42 | HR 50 | Temp 98.2°F | Resp 19 | Ht 70.0 in | Wt 208.0 lb

## 2017-02-18 DIAGNOSIS — Z86010 Personal history of colonic polyps: Secondary | ICD-10-CM

## 2017-02-18 MED ORDER — SODIUM CHLORIDE 0.9 % IV SOLN: 500.0000 mL | Freq: Once | INTRAVENOUS | Status: DC

## 2017-02-18 NOTE — Progress Notes (Signed)
Spontaneous respirations throughout. VSS. Resting comfortably. To PACU on room air. Report to  RN. 

## 2017-02-18 NOTE — Op Note (Signed)
[facility] Patient Name: Mario [name] Procedure Date: 02/18/2017 10:48 AM MRN: [id number] Endoscopist: Mallie Mussel L. Loletha Carrow , MD Age: 77 Referring MD:  Date of Birth: 03-06-1940 Gender: Male Account #: [id number] Procedure:                Colonoscopy Indications:              Surveillance: Personal history of adenomatous                            polyps on last colonoscopy > 5 years ago (<12mm TA                            in 2013) Medicines:                Monitored Anesthesia Care Procedure:                Pre-Anesthesia Assessment:                           - Prior to the procedure, a History and Physical                            was performed, and patient medications and                            allergies were reviewed. The patient's tolerance of                            previous anesthesia was also reviewed. The risks                            and benefits of the procedure and the sedation                            options and risks were discussed with the patient.                            All questions were answered, and informed consent                            was obtained. Prior Anticoagulants: The patient has                            taken Eliquis (apixaban), last dose was 2 days                            prior to procedure. ASA Grade Assessment: III - A                            patient with severe systemic disease. After                            reviewing the risks and benefits, the patient was  deemed in satisfactory condition to undergo the                            procedure.                           After obtaining informed consent, the colonoscope                            was passed under direct vision. Throughout the                            procedure, the patient's blood pressure, pulse, and                            oxygen saturations were monitored continuously. The                            Colonoscope was  introduced through the anus and                            advanced to the the cecum, identified by                            appendiceal orifice and ileocecal valve. The                            colonoscopy was performed without difficulty. The                            patient tolerated the procedure well. The quality                            of the bowel preparation was good. The ileocecal                            valve, appendiceal orifice, and rectum were                            photographed. The quality of the bowel preparation                            was evaluated using the BBPS [facility] Bowel                            Preparation Scale) with scores of: Right Colon = 2,                            Transverse Colon = 2 and Left Colon = 2. The total                            BBPS score equals 6. After lavage. The bowel  preparation used was SUPREP. Scope In: 10:56:46 AM Scope Out: 11:10:41 AM Scope Withdrawal Time: 0 hours 8 minutes 58 seconds  Total Procedure Duration: 0 hours 13 minutes 55 seconds  Findings:                 The perianal and digital rectal examinations were                            normal.                           Diverticula were found in the entire colon, most                            notable in the left colon.                           The exam was otherwise without abnormality on                            direct and retroflexion views. Complications:            No immediate complications. Estimated Blood Loss:     Estimated blood loss: none. Impression:               - Diverticulosis in the entire examined colon.                           - The examination was otherwise normal on direct                            and retroflexion views.                           - No specimens collected. Recommendation:           - Patient has a contact number available for                            emergencies. The signs and  symptoms of potential                            delayed complications were discussed with the                            patient. Return to normal activities tomorrow.                            Written discharge instructions were provided to the                            patient.                           - Resume previous diet.                           - Continue present medications.                           -  Resume Eliquis (apixaban) at prior dose today.                           - No repeat screening colonoscopy due to age. Henry L. Loletha Carrow, MD 02/18/2017 11:15:20 AM This report has been signed electronically.

## 2017-02-18 NOTE — Patient Instructions (Signed)
YOU HAD AN ENDOSCOPIC PROCEDURE TODAY AT [facility]:   Refer to the procedure report that was given to you for any specific questions about what was found during the examination.  If the procedure report does not answer your questions, please call your gastroenterologist to clarify.  If you requested that your care partner not be given the details of your procedure findings, then the procedure report has been included in a sealed envelope for you to review at your convenience later.  YOU SHOULD EXPECT: Some feelings of bloating in the abdomen. Passage of more gas than usual.  Walking can help get rid of the air that was put into your GI tract during the procedure and reduce the bloating. If you had a lower endoscopy (such as a colonoscopy or flexible sigmoidoscopy) you may notice spotting of blood in your stool or on the toilet paper. If you underwent a bowel prep for your procedure, you may not have a normal bowel movement for a few days.  Please Note:  You might notice some irritation and congestion in your nose or some drainage.  This is from the oxygen used during your procedure.  There is no need for concern and it should clear up in a day or so.  SYMPTOMS TO REPORT IMMEDIATELY:   Following lower endoscopy (colonoscopy or flexible sigmoidoscopy):  Excessive amounts of blood in the stool  Significant tenderness or worsening of abdominal pains  Swelling of the abdomen that is new, acute  Fever of 100F or higher   For urgent or emergent issues, a gastroenterologist can be reached at any hour by calling [phone].   DIET:  We do recommend a small meal at first, but then you may proceed to your regular diet.  Drink plenty of fluids but you should avoid alcoholic beverages for 24 hours.  ACTIVITY:  You should plan to take it easy for the rest of today and you should NOT DRIVE or use heavy machinery until tomorrow (because of the sedation medicines used during the test).     FOLLOW UP: Our staff will call the number listed on your records the next business day following your procedure to check on you and address any questions or concerns that you may have regarding the information given to you following your procedure. If we do not reach you, we will leave a message.  However, if you are feeling well and you are not experiencing any problems, there is no need to return our call.  We will assume that you have returned to your regular daily activities without incident.  If any biopsies were taken you will be contacted by phone or by letter within the next 1-3 weeks.  Please call us at [phone] if you have not heard about the biopsies in 3 weeks.    SIGNATURES/CONFIDENTIALITY: You and/or your care partner have signed paperwork which will be entered into your electronic medical record.  These signatures attest to the fact that that the information above on your After Visit Summary has been reviewed and is understood.  Full responsibility of the confidentiality of this discharge information lies with you and/or your care-partner.  Diverticulosis information given.    Resume Eliquis today.  No recall colonoscopy.

## 2017-02-19 ENCOUNTER — Telehealth: Payer: Self-pay | Admitting: *Deleted

## 2017-02-19 NOTE — Telephone Encounter (Signed)
  Follow up Call-  Call back number 02/18/2017 09/21/2014  Post procedure Call Back phone  # [phone]  Permission to leave phone message Yes Yes  Some recent data might be hidden     Patient questions:  Do you have a fever, pain , or abdominal swelling? No. Pain Score  0 *  Have you tolerated food without any problems? Yes.    Have you been able to return to your normal activities? Yes.    Do you have any questions about your discharge instructions: Diet   No. Medications  No. Follow up visit  No.  Do you have questions or concerns about your Care? No.  Actions: * If pain score is 4 or above: No action needed, pain <4.  Stated "I didn't feel good yesterday but I feel better today".

## 2017-02-27 DIAGNOSIS — R69 Illness, unspecified: Secondary | ICD-10-CM | POA: Diagnosis not present

## 2017-03-04 DIAGNOSIS — G4733 Obstructive sleep apnea (adult) (pediatric): Secondary | ICD-10-CM | POA: Diagnosis not present

## 2017-03-07 ENCOUNTER — Other Ambulatory Visit: Payer: Self-pay | Admitting: Cardiovascular Disease

## 2017-03-07 NOTE — Telephone Encounter (Signed)
Rx has been sent to the pharmacy electronically. ° °

## 2017-03-17 DIAGNOSIS — R972 Elevated prostate specific antigen [PSA]: Secondary | ICD-10-CM | POA: Diagnosis not present

## 2017-03-17 DIAGNOSIS — E291 Testicular hypofunction: Secondary | ICD-10-CM | POA: Diagnosis not present

## 2017-03-21 DIAGNOSIS — E291 Testicular hypofunction: Secondary | ICD-10-CM | POA: Diagnosis not present

## 2017-03-21 DIAGNOSIS — R972 Elevated prostate specific antigen [PSA]: Secondary | ICD-10-CM | POA: Diagnosis not present

## 2017-03-28 DIAGNOSIS — R972 Elevated prostate specific antigen [PSA]: Secondary | ICD-10-CM | POA: Diagnosis not present

## 2017-04-02 ENCOUNTER — Other Ambulatory Visit: Payer: Self-pay | Admitting: Nurse Practitioner

## 2017-04-02 DIAGNOSIS — R972 Elevated prostate specific antigen [PSA]: Secondary | ICD-10-CM

## 2017-04-17 ENCOUNTER — Ambulatory Visit
Admission: RE | Admit: 2017-04-17 | Discharge: 2017-04-17 | Disposition: A | Discharge status: Home / Self Care | Payer: Medicare HMO | Source: Ambulatory Visit | Attending: Nurse Practitioner | Admitting: Nurse Practitioner

## 2017-04-17 DIAGNOSIS — R972 Elevated prostate specific antigen [PSA]: Secondary | ICD-10-CM

## 2017-04-17 MED ORDER — GADOBENATE DIMEGLUMINE 529 MG/ML IV SOLN
20.0000 mL | Freq: Once | INTRAVENOUS | Status: AC | PRN
  Administered 2017-04-17: 20 mL via INTRAVENOUS

## 2017-04-29 ENCOUNTER — Telehealth: Payer: Self-pay | Admitting: Cardiovascular Disease

## 2017-04-29 DIAGNOSIS — I480 Paroxysmal atrial fibrillation: Secondary | ICD-10-CM

## 2017-04-29 DIAGNOSIS — I10 Essential (primary) hypertension: Secondary | ICD-10-CM

## 2017-04-29 NOTE — Telephone Encounter (Signed)
New Message   Pt wants to know if he needs any labs before appt, no recent orders in epic. Please call

## 2017-04-29 NOTE — Telephone Encounter (Signed)
Returned call to patient.Advised he is due for fasting lab before he sees Dr.Kelly.Patient will have labs cmet,lipid panel,cbc,tsh done 06/13/17 at [facility] office.Lab orders mailed.

## 2017-05-13 ENCOUNTER — Other Ambulatory Visit: Payer: Self-pay | Admitting: Cardiovascular Disease

## 2017-05-15 DIAGNOSIS — N5201 Erectile dysfunction due to arterial insufficiency: Secondary | ICD-10-CM | POA: Diagnosis not present

## 2017-05-15 DIAGNOSIS — E291 Testicular hypofunction: Secondary | ICD-10-CM | POA: Diagnosis not present

## 2017-05-15 DIAGNOSIS — R972 Elevated prostate specific antigen [PSA]: Secondary | ICD-10-CM | POA: Diagnosis not present

## 2017-05-15 DIAGNOSIS — N400 Enlarged prostate without lower urinary tract symptoms: Secondary | ICD-10-CM | POA: Diagnosis not present

## 2017-05-26 ENCOUNTER — Telehealth: Payer: Self-pay | Admitting: *Deleted

## 2017-05-26 NOTE — Telephone Encounter (Signed)
Faxed [facility] order to [facility] for CPAP supplies.

## 2017-06-10 DIAGNOSIS — G4733 Obstructive sleep apnea (adult) (pediatric): Secondary | ICD-10-CM | POA: Diagnosis not present

## 2017-06-13 DIAGNOSIS — I480 Paroxysmal atrial fibrillation: Secondary | ICD-10-CM | POA: Diagnosis not present

## 2017-06-13 DIAGNOSIS — I10 Essential (primary) hypertension: Secondary | ICD-10-CM | POA: Diagnosis not present

## 2017-06-13 LAB — COMPREHENSIVE METABOLIC PANEL
A/G RATIO: 1.8 (ref 1.2–2.2)
ALBUMIN: 4.4 g/dL (ref 3.5–4.8)
ALT: 15 IU/L (ref 0–44)
AST: 23 IU/L (ref 0–40)
Alkaline Phosphatase: 71 IU/L (ref 39–117)
BUN / CREAT RATIO: 12 (ref 10–24)
BUN: 13 mg/dL (ref 8–27)
Bilirubin Total: 0.8 mg/dL (ref 0.0–1.2)
CALCIUM: 9.5 mg/dL (ref 8.6–10.2)
CO2: 24 mmol/L (ref 20–29)
CREATININE: 1.05 mg/dL (ref 0.76–1.27)
Chloride: 100 mmol/L (ref 96–106)
GFR, EST AFRICAN AMERICAN: 79 mL/min/{1.73_m2} (ref >59)
GFR, EST NON AFRICAN AMERICAN: 69 mL/min/{1.73_m2} (ref >59)
GLOBULIN, TOTAL: 2.4 g/dL (ref 1.5–4.5)
Glucose: 77 mg/dL (ref 65–99)
POTASSIUM: 4.6 mmol/L (ref 3.5–5.2)
SODIUM: 138 mmol/L (ref 134–144)
TOTAL PROTEIN: 6.8 g/dL (ref 6.0–8.5)

## 2017-06-13 LAB — CBC WITH DIFFERENTIAL/PLATELET
Basophils Absolute: 0 10*3/uL (ref 0.0–0.2)
Basos: 1 %
EOS (ABSOLUTE): 0.2 10*3/uL (ref 0.0–0.4)
EOS: 3 %
HEMATOCRIT: 46.3 % (ref 37.5–51.0)
Hemoglobin: 16.2 g/dL (ref 13.0–17.7)
IMMATURE GRANULOCYTES: 1 %
Immature Grans (Abs): 0.1 10*3/uL (ref 0.0–0.1)
Lymphocytes Absolute: 1.5 10*3/uL (ref 0.7–3.1)
Lymphs: 29 %
MCH: 32.5 pg (ref 26.6–33.0)
MCHC: 35 g/dL (ref 31.5–35.7)
MCV: 93 fL (ref 79–97)
MONOS ABS: 0.7 10*3/uL (ref 0.1–0.9)
Monocytes: 14 %
NEUTROS ABS: 2.7 10*3/uL (ref 1.4–7.0)
Neutrophils: 52 %
PLATELETS: 215 10*3/uL (ref 150–379)
RBC: 4.98 x10E6/uL (ref 4.14–5.80)
RDW: 13.5 % (ref 12.3–15.4)
WBC: 5.2 10*3/uL (ref 3.4–10.8)

## 2017-06-13 LAB — LIPID PANEL W/O CHOL/HDL RATIO
Cholesterol, Total: 155 mg/dL (ref 100–199)
HDL: 45 mg/dL (ref >39)
LDL Calculated: 84 mg/dL (ref 0–99)
Triglycerides: 132 mg/dL (ref 0–149)
VLDL CHOLESTEROL CAL: 26 mg/dL (ref 5–40)

## 2017-06-13 LAB — TSH: TSH: 3.36 u[IU]/mL (ref 0.450–4.500)

## 2017-06-19 ENCOUNTER — Encounter: Payer: Self-pay | Admitting: Cardiovascular Disease

## 2017-06-19 ENCOUNTER — Other Ambulatory Visit: Payer: Self-pay | Admitting: Cardiovascular Disease

## 2017-06-19 ENCOUNTER — Ambulatory Visit: Payer: Medicare HMO | Admitting: Cardiovascular Disease

## 2017-06-19 VITALS — BP 142/74 | HR 56 | Ht 72.0 in | Wt 209.8 lb

## 2017-06-19 DIAGNOSIS — Z7901 Long term (current) use of anticoagulants: Secondary | ICD-10-CM

## 2017-06-19 DIAGNOSIS — G4733 Obstructive sleep apnea (adult) (pediatric): Secondary | ICD-10-CM | POA: Diagnosis not present

## 2017-06-19 DIAGNOSIS — I480 Paroxysmal atrial fibrillation: Secondary | ICD-10-CM

## 2017-06-19 DIAGNOSIS — I10 Essential (primary) hypertension: Secondary | ICD-10-CM | POA: Diagnosis not present

## 2017-06-19 DIAGNOSIS — Z9989 Dependence on other enabling machines and devices: Secondary | ICD-10-CM

## 2017-06-19 MED ORDER — AMLODIPINE BESYLATE 10 MG PO TABS: 10.0000 mg | ORAL_TABLET | Freq: Every day | ORAL | 3 refills | Status: DC

## 2017-06-19 NOTE — Progress Notes (Signed)
Patient ID: Menno Vanbergen, male   DOB: 28-Oct-1940, 78 y.o.   MRN: [id number]     Primary M.D.: Dr. Mallie Mussel   HPI:  Saivon Prowse is a 77 y.o. male who presents for a 7 month follow-up cardiology evaluation.  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  He underwent endoscopy by Dr. Erskine Emery and was noted to have an irregular heart rhythm with  sinus rhythm with PVCs and PACs when connected to the monitor.  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  He had undergone a cardiac catheterization in approximately 1985.  Last year  noticed chest tightness when working out and also had noticed significant increasing shortness of breath.  He has been aware of a somewhat irregular rhythm.  Over several months.  In July he was recently treated with diverticulitis with Cipro and Flagyl and had noticed increased palpitations at that time.  When I saw for initial evaluation on 09/27/2014 he was in atrial fibrillation which he was completely unaware of.  On further questioning, he omitted that he snores very loudly and has had significant episodes of witnessed apnea.  He can fall asleep during the day at any time if circumstances permit.  He has a history of hypertension.  Recently he had been taking atenolol 150 mg in the morning had taken testosterone injections once a week.  I changed his atenolol to 100 mg in the morning and 50 mg grams at night.  He was significantly hypertensive and I added amlodipine 5 mg to his medical regimen.  I scheduled him for a nuclear perfusion study which was done in 10/07/2014.  There were no ECG changes.  There was a small defect of mild severity in the basal inferior wall.  It was felt most likely this was diaphragmatic attenuation.  Ejection fraction was 59%.  An echo Doppler study revealed an EF of  50-55% and raise the possibility of mild posterior basal hypokinesis.  He had mild AR and mild MR.  He was started on eliquis at his office visit.  On follow-up evaluation, his blood pressure was still elevated.  I increased his amlodipine to 7.5 mg in the morning and also started him on losartan 50 mg daily.  He underwent a sleep study which confirm my suspicion for obstructive sleep apnea.  Overall sleep apnea was mild with an AHI of 12.3 , but events were moderate with supine sleep with an AHI of 22.8 per hour. He has significant oxygen desaturation to a nadir of 81% and on the initial study had reduced sleep efficiency at 61.8% and evidence for loud snoring.  He subsequently underwent a CPAP titration trial and he was titrated up to 8 cm water pressure with excellent result.  He underwent successful cardioversion for his atrial fibrillation  On 12/01/2014. He admits to one brief episode following the cardioversion while sleeping before CPAP therapy was initiated and this had reverted back to normal rhythm in the morning when he awakened  CPAP was initiated on November 14, 2014. He has a ResMed Airfit P10 nasalpillow mask, medium size. A download was reviewed from 12/12/2014 through 01/10/2015. His compliance is excellent at 100% of usage.  He is averaging 8 hours and 24 minutes.  His AHI was still mildly elevated at 6.5, but  significant improved initially.  There is no significant leak.  He had undergone biopsy of his laryngeal nodule, which was negative.  When I saw him in January 2017, he complained of several days of being back in atrial fibrillation which seem to be precipitated after prolonged sneezing spell.  When I saw him, with his recurrent AF and normal LV function.  I recommended initiation of flecanide at 50 mg twice a day.  He was advised to reduce his atenolol from 75 g twice a day to 50 mg twice a day for 2 days and on the third day to reduce his atenolol to 25 g twice a day.  After 2  doses of flecanide he felt that his heart rhythm had again stabilized.  A CPAP download  from January 24, 2015 through 02/19/2015 demonstrated 100% compliance.  He averaged 9 hours of sleep per night.  He is set at an auto mode and his 95th percentile pressure was 13.7 with a maximum 16.9.  AHI was 7.9.  Of note, one week ago when I saw the patient.  We discussed turning off his ramp since at the beginning of his sleep.  He was having some difficulty getting adequate air. I rechecked a download for the past week when the has been off, AHI was slightly improved at 5.8/hr.  He feels well.  He has more energy.  He is exercising regularly.  He believes his sleep is good quality.  He denies residual daytime sleepiness.  In April 2016 he had been under increased mental stress and felt that  he went back into atrial fibrillation for approximately 12 hours, but then ultimately his rhythm stabilized.  When I saw him 6 weeks ago, he was in sinus rhythm.  At that time I increased his flecainide to 75 mg twice a day.  I also scheduled him for routine treadmill test to make certain he did not have any flecanide induced proarrhythmia.  This did not demonstrate any exercise-induced arrhythmia.  He was noted to have upsloping ST segment depression of approximately 1 mm with rapid resolution in less than 1 minute in early recovery.  Previously, his nuclear perfusion study did not show any ischemia.  He feels that his mental stress has improved since his daughter seems to be reconciling with her husband and is no longer planning to go through a divorce.  He denies any chest pain or shortness of breath.    When I last saw him in October 2018.  He was unaware of any recurrent episodes of atrial fibrillation.  However, at times he had experienced an occasional skipped beat.   He was cutting wood and accidentally cut off the distal aspect of his fourth finger of his left hand several months ago.  He also had been taking  testosterone prescribed by his urologist, but recently his hemoglobin had increased and he reduced his testosterone dose in half.  He has continued to be on flecainide 75 mg twice a day in addition to eliquis 5 mg twice a day for anticoagulation.  He also has been on amlodipine 7.5 mg, and losartan 100 mg in addition to atenolol 6.25 mg twice a day for hypertension.  He continues to use CPAP and cannot sleep without it.  In his words "a life changor."  His CPAP set up day was 03/04/2016.  A download was obtained in the office today from April 9 through 06/18/2017.  He is 100% compliant in sleeping 8 hours and 13 minutes.  He has a ResMed AirSense 10 AutoSet unit with a minimum pressure at 8 and maximum of 20.  95th percentile pressure was 12.6 with a maximum average pressure at 14.6.  AHI was 3.2 but there were days of significant leak.  He just recently got a new mask.  He admits to being under increased stress.  His dad is been in hospice.  He presents for evaluation.  Past Medical History:  Diagnosis Date  . Arthritis    neck  . Dysrhythmia    afib - cardioverted 01/2015  . History of kidney stones   . Hypertension   . Melanoma ([place]) 2000   back; stage 3, squamous 1 - basal- several  . Neuromuscular disorder ([place])   . Phlebitis   . Pneumonia   . Seasonal allergies   . Shortness of breath dyspnea    with exertion  . Tubular adenoma of colon 2013   colonoscpy in 5 years [name]    Past Surgical History:  Procedure Laterality Date  . 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 CARDIOVERSION N/A 12/01/2014   Procedure: CARDIOVERSION;  Surgeon: Troy Sine, MD;  Location: [facility];  Service: Cardiovascular;  Laterality: N/A;  . CATARACT EXTRACTION  2002, 2004   bilateral  . CYSTOSCOPY    . ELBOW SURGERY Left 1984   tendon repair left arm  . INCISION AND DRAINAGE OF WOUND Left 08/28/2016   Procedure: IRRIGATION AND  DEBRIDEMENT WOUND;  Surgeon: Iran Planas, MD;  Location: [facility];  Service: Orthopedics;  Laterality: Left;  . LITHOTRIPSY    . melanoma removal  2000   back  . METACARPAL OSTEOTOMY Left 08/28/2016   Procedure: 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.;  Surgeon: Iran Planas, MD;  Location: [facility];  Service: Orthopedics;  Laterality: Left;  [name] MICROLARYNGOSCOPY WITH LASER N/A 01/25/2015   Procedure: MICROLARYNGOSCOPY ;  Surgeon: Melida Quitter, MD;  Location: [facility];  Service: ENT;  Laterality: N/A;  Microlaryngoscopy with 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 with CO2 laser   . TENDON REPAIR Right 2012   right  . TONSILLECTOMY    . UPPER GI ENDOSCOPY      Allergies  Allergen Reactions  . Lisinopril     REACTION: severe GI upset requiring EGD--temporally related to lisinopril--tolerated micardis    Current Outpatient Medications  Medication Sig Dispense Refill  . amLODipine (NORVASC) 10 MG tablet Take 1 tablet (10 mg total) by mouth daily. 90 tablet 3  . atenolol (TENORMIN) 25 MG tablet Take 6.25 mg by mouth 2 (two) times daily.     [name] ELIQUIS 5 MG TABS tablet TAKE 1 TABLET BY MOUTH TWICE DAILY 180 tablet 1  . flecainide (TAMBOCOR) 50 MG tablet TAKE 1 & 1/2 (ONE & ONE-HALF) TABLETS BY MOUTH TWICE DAILY 180 tablet 1  . GLUCOSAMINE-CHONDROITIN PO Take 1 tablet by mouth daily.     [name] losartan (COZAAR) 100 MG tablet TAKE ONE TABLET BY MOUTH ONCE DAILY 90 tablet 3  . Multiple Vitamin (MULTIVITAMIN) tablet Take 1 tablet by mouth daily.    . Saw Palmetto, Serenoa repens, (SAW PALMETTO PO) Take 1 tablet by mouth daily.    [name] testosterone cypionate (DEPOTESTOSTERONE CYPIONATE) 200 MG/ML injection Inject 50 mg into the muscle once a week.     [name] atenolol (TENORMIN) 100 MG tablet TAKE 1 TABLET BY MOUTH ONCE EVERY MORNING THEN TAKE 1/2 TABLET NIGHTLY 45  tablet 3   Current Facility-Administered Medications  Medication Dose Route Frequency Provider Last Rate Last Dose  . 0.9 %  sodium chloride  infusion  500 mL Intravenous Once Doran Stabler, MD        Social History   Socioeconomic History  . Marital status: Married    Spouse name: Not on file  . Number of children: 2  . Years of education: Not on file  . Highest education level: Not on file  Occupational History  . Occupation: retired  Scientific laboratory technician  . Financial resource strain: Not on file  . Food insecurity:    Worry: Not on file    Inability: Not on file  . Transportation needs:    Medical: Not on file    Non-medical: Not on file  Tobacco Use  . Smoking status: Former Smoker    Start date: 11/20/1976  . Smokeless tobacco: Never Used  . Tobacco comment: quit smoking 1986 ish  Substance and Sexual Activity  . Alcohol use: Yes    Alcohol/week: 1.8 - 2.4 oz    Types: 3 - 4 Standard drinks or equivalent per week    Comment: occ   . Drug use: No  . Sexual activity: Not on file  Lifestyle  . Physical activity:    Days per week: Not on file    Minutes per session: Not on file  . Stress: Not on file  Relationships  . Social connections:    Talks on phone: Not on file    Gets together: Not on file    Attends religious service: Not on file    Active member of club or organization: Not on file    Attends meetings of clubs or organizations: Not on file    Relationship status: Not on file  . Intimate partner violence:    Fear of current or ex partner: Not on file    Emotionally abused: Not on file    Physically abused: Not on file    Forced sexual activity: Not on file  Other Topics Concern  . Not on file  Social History Narrative  . Not on file   Additional social history is notable in that he is married for 42 years.  He has 2 children and 2 grandchildren.  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  He completed 12th grade of education.  Remotely he had smoked but quit in 1977.  He drinks 3 beers per day.  He has been exercising fairly regularly at least 4 days per week  including weightlifting and cardio for~ 2 hours per session.  Family History  Problem Relation Age of Onset  . Heart disease Mother   . Heart disease Sister   . Heart disease Sister   . Hypertension Sister   . Colon cancer Neg Hx   . Stomach cancer Neg Hx    Family history is notable that his mother died at age 54 with heart disease and had atrial fibrillation and congestive heart failure.  Father died at 34 with a brain tumor.  He has 2 sisters and both have hypertension.   ROS General: Negative; No fevers, chills, or night sweats HEENT: Negative; No changes in vision or hearing, sinus congestion, difficulty swallowing Pulmonary: Negative; No cough, wheezing, shortness of breath, hemoptysis Cardiovascular:  See HPI;  GI: Negative; No nausea, vomiting, diarrhea, or abdominal pain GU: Negative; No dysuria, hematuria, or difficulty voiding Musculoskeletal: Recent traumatic amputation of the distal  aspect of his fourth finger of his left hand due to a sawing accident Hematologic/Oncologic: Negative; no easy bruising, bleeding Endocrine: Negative; no heat/cold intolerance; no diabetes Neuro: Negative; no changes in balance, headaches Skin: Negative; No rashes or skin lesions Psychiatric: Negative; No behavioral problems, depression Sleep: Positive for previously loud snoring, frequent awakenings, nonrestorative sleep, and daytime sleepiness, hypersomnolence, now on CPAP therapy with resolution of symptoms; no bruxism, restless legs, hypnogagnic hallucinations Other comprehensive 14 point system review is negative   Physical Exam BP (!) 142/74   Pulse (!) 56   Ht 6' (1.829 m)   Wt 209 lb 12.8 oz (95.2 kg)   BMI 28.45 kg/m    Repeat blood pressure by me was 148/76.  Wt Readings from Last 3 Encounters:  06/19/17 209 lb 12.8 oz (95.2 kg)  02/18/17 208 lb (94.3 kg)  01/08/17 208 lb 8 oz (94.6 kg)   General: Alert, oriented, no distress.  Skin: normal turgor, no rashes, warm and  dry HEENT: Normocephalic, atraumatic. Pupils equal round and reactive to light; sclera anicteric; extraocular muscles intact;  Nose without nasal septal hypertrophy Mouth/Parynx benign; Mallinpatti scale 3 Neck: No JVD, no carotid bruits; normal carotid upstroke Lungs: clear to ausculatation and percussion; no wheezing or rales Chest wall: without tenderness to palpitation Heart: PMI not displaced, RRR, s1 s2 normal, 1/6 systolic murmur, no diastolic murmur, no rubs, gallops, thrills, or heaves Abdomen: soft, nontender; no hepatosplenomehaly, BS+; abdominal aorta nontender and not dilated by palpation. Back: no CVA tenderness Pulses 2+ Musculoskeletal: full range of motion, normal strength, no joint deformities Extremities: Amputation of the distal aspect of the left hand 4th finger; no clubbing cyanosis or edema, Homan's sign negative  Neurologic: grossly nonfocal; Cranial nerves grossly wnl Psychologic: Normal mood and affect    ECG (independently read by me): Bradycardia 56 bpm.  Nonspecific interventricular conduction delay.  QTc interval not 391 ms  October 2018 ECG (independently read by me): Sinus bradycardia 56 bpm.  No ectopy.  Normal intervals per  06/05/2016 ECG (independently read by me): Normal sinus rhythm at 61 bpm.  Nonspecific T changes.  QTc interval 390 ms.  October 2017 ECG (independently read by me): Sinus bradycardia 57 bpm.  Nonspecific ST changes.  QTc interval 393 ms.  06/30/2015 ECG (independently read by me): Sinus bradycardia at 56 bpm.  No significant ST-T changes.  QTc interval 389 ms.  05/16/2015 ECG (independently read by me): Sinus bradycardia 59 bpm.  QTc interval 376 ms.  No significant ST changes.  02/23/2015 ECG (independently read by me): Sinus bradycardia 59 bpm.  QTc interval 390 ms.  Nonspecific T changes.  02/17/15 ECG (independently read by me): Atrial fibrillation at 58 bpm.  Nonspecific ST changes.  QTc interval 380 ms.  01/12/2015 ECG   (independently read by me):  Normal sinus rhythm at 62 bpm. Nonspecific ST-T changes.  September 2016 ECG (independently read by me): Atrial fibrillation with a slow ventricular response in the 50s.  QTc interval 364 ms.  Prior CG (independently read by me):  Atrial fibrillation with ventricular rate at 52 bpm.  No significant ST-T change.  09/27/2014 ECG (independently read by me): Atrial fibrillation with ventricular rate at 59 bpm.  Nonspecific ST-T changes.  LABS:  BMP Latest Ref Rng & Units 06/13/2017 08/28/2016 11/28/2015  Glucose 65 - 99 mg/dL 77 103(H) 84  BUN 8 - 27 mg/dL '13 12 18  ' Creatinine 0.76 - 1.27 mg/dL 1.05 1.18 1.18  BUN/Creat Ratio 10 - 24  12 - -  Sodium 134 - 144 mmol/L 138 135 136  Potassium 3.5 - 5.2 mmol/L 4.6 4.2 4.7  Chloride 96 - 106 mmol/L 100 105 100  CO2 20 - 29 mmol/L '24 22 25  ' Calcium 8.6 - 10.2 mg/dL 9.5 9.0 9.1     Hepatic Function Latest Ref Rng & Units 06/13/2017 11/28/2015 02/17/2015  Total Protein 6.0 - 8.5 g/dL 6.8 6.6 6.8  Albumin 3.5 - 4.8 g/dL 4.4 3.8 4.1  AST 0 - 40 IU/L '23 25 20  ' ALT 0 - 44 IU/L '15 15 19  ' Alk Phosphatase 39 - 117 IU/L 71 60 67  Total Bilirubin 0.0 - 1.2 mg/dL 0.8 0.7 0.9    CBC Latest Ref Rng & Units 06/13/2017 08/28/2016 11/28/2015  WBC 3.4 - 10.8 x10E3/uL 5.2 9.1 5.0  Hemoglobin 13.0 - 17.7 g/dL 16.2 16.9 16.2  Hematocrit 37.5 - 51.0 % 46.3 47.6 48.0  Platelets 150 - 379 x10E3/uL 215 166 198   Lab Results  Component Value Date   MCV 93 06/13/2017   MCV 91.9 08/28/2016   MCV 93.9 11/28/2015   Lab Results  Component Value Date   TSH 3.360 06/13/2017   Lab Results  Component Value Date   HGBA1C 4.9 05/06/2006     BNP No results found for: BNP  ProBNP No results found for: PROBNP   Lipid Panel     Component Value Date/Time   CHOL 155 06/13/2017 0951   TRIG 132 06/13/2017 0951   HDL 45 06/13/2017 0951   CHOLHDL 3.3 11/28/2015 0943   VLDL 24 11/28/2015 0943   LDLCALC 84 06/13/2017 0951     RADIOLOGY: No results found.  IMPRESSION:  1. Paroxysmal atrial fibrillation (HCC)   2. Essential hypertension   3. OSA on CPAP   4. Anticoagulation adequate     ASSESSMENT AND PLAN: Mr. Nic Lampe is an active young appearing 77 year old male who was found to be in atrial fibrillation in August 2016 and was started on anticoagulation.  He had undergone initial cardioversion in October 2016 and develop recurrent atrial fibrillation in January 2017 converted to sinus rhythm with institution of flecanide.  He is maintaining sinus rhythm on flecainide 75 mg twice a day in addition to low-dose beta blocker therapy with atenolol 12.5 mg.  His blood pressure today is well controlled and he continues to take losartan 100 mg, amlodipine 7.5 mg in addition to low-dose atenolol.  His hemoglobin had risen in the past leading to reduction in his testosterone supplementation by his primary physician.  His blood pressure today is mildly elevated.  I've suggested further titration of amlodipine from 7.5 mg up to 10 mg daily.  He continues to be on atenolol 6.25 mg twice a day in addition to losartan 100 mg daily.  In the past he had been given a prescription for HCTZ but never used this.  He denies recent edema.  He is maintaining sinus rhythm on flecainide.  He is anticoagulated on eliquis.  Jeanne Ivan obtained.  A download and reviewed this with him in detail.  He is 100% compliant with reference to CPAP therapy.  As long as he remains stable I will see him in 6 months for reevaluation.  Time spent 25 minutes.    Troy Sine, MD, [facility] 06/21/2017 11:54 AM

## 2017-06-19 NOTE — Patient Instructions (Addendum)
Medication Instructions:  INCREASE amlodipine to 10 mg daily  Follow-Up: Your physician wants you to follow-up in: 6 months with Dr. Claiborne Billings. You will receive a reminder letter in the mail two months in advance. If you don't receive a letter, please call our office to schedule the follow-up appointment.     If you need a refill on your cardiac medications before your next appointment, please call your pharmacy.

## 2017-06-19 NOTE — Telephone Encounter (Signed)
Rx has been sent to the pharmacy electronically. ° °

## 2017-06-21 ENCOUNTER — Encounter: Payer: Self-pay | Admitting: Cardiovascular Disease

## 2017-07-03 ENCOUNTER — Other Ambulatory Visit: Payer: Self-pay | Admitting: Cardiology

## 2017-07-04 NOTE — Telephone Encounter (Signed)
Rx request sent to pharmacy.  

## 2017-07-16 DIAGNOSIS — L820 Inflamed seborrheic keratosis: Secondary | ICD-10-CM | POA: Diagnosis not present

## 2017-07-16 DIAGNOSIS — H61002 Unspecified perichondritis of left external ear: Secondary | ICD-10-CM | POA: Diagnosis not present

## 2017-07-16 DIAGNOSIS — Z85828 Personal history of other malignant neoplasm of skin: Secondary | ICD-10-CM | POA: Diagnosis not present

## 2017-07-16 DIAGNOSIS — L821 Other seborrheic keratosis: Secondary | ICD-10-CM | POA: Diagnosis not present

## 2017-07-16 DIAGNOSIS — C4441 Basal cell carcinoma of skin of scalp and neck: Secondary | ICD-10-CM | POA: Diagnosis not present

## 2017-07-16 DIAGNOSIS — D485 Neoplasm of uncertain behavior of skin: Secondary | ICD-10-CM | POA: Diagnosis not present

## 2017-07-16 DIAGNOSIS — Z85820 Personal history of malignant melanoma of skin: Secondary | ICD-10-CM | POA: Diagnosis not present

## 2017-07-16 DIAGNOSIS — C44319 Basal cell carcinoma of skin of other parts of face: Secondary | ICD-10-CM | POA: Diagnosis not present

## 2017-07-16 DIAGNOSIS — L111 Transient acantholytic dermatosis [Grover]: Secondary | ICD-10-CM | POA: Diagnosis not present

## 2017-07-16 DIAGNOSIS — L570 Actinic keratosis: Secondary | ICD-10-CM | POA: Diagnosis not present

## 2017-07-16 DIAGNOSIS — L565 Disseminated superficial actinic porokeratosis (DSAP): Secondary | ICD-10-CM | POA: Diagnosis not present

## 2017-09-02 DIAGNOSIS — R69 Illness, unspecified: Secondary | ICD-10-CM | POA: Diagnosis not present

## 2017-09-22 DIAGNOSIS — G4733 Obstructive sleep apnea (adult) (pediatric): Secondary | ICD-10-CM | POA: Diagnosis not present

## 2017-09-30 ENCOUNTER — Other Ambulatory Visit: Payer: Self-pay | Admitting: Cardiovascular Disease

## 2017-10-01 NOTE — Telephone Encounter (Signed)
Rx sent to pharmacy   

## 2017-10-09 DIAGNOSIS — H903 Sensorineural hearing loss, bilateral: Secondary | ICD-10-CM | POA: Diagnosis not present

## 2017-10-09 DIAGNOSIS — H60543 Acute eczematoid otitis externa, bilateral: Secondary | ICD-10-CM | POA: Diagnosis not present

## 2017-11-03 ENCOUNTER — Other Ambulatory Visit: Payer: Self-pay | Admitting: Cardiovascular Disease

## 2017-11-13 DIAGNOSIS — E291 Testicular hypofunction: Secondary | ICD-10-CM | POA: Diagnosis not present

## 2017-11-13 DIAGNOSIS — R972 Elevated prostate specific antigen [PSA]: Secondary | ICD-10-CM | POA: Diagnosis not present

## 2017-11-19 DIAGNOSIS — Z961 Presence of intraocular lens: Secondary | ICD-10-CM | POA: Diagnosis not present

## 2017-11-19 DIAGNOSIS — H52203 Unspecified astigmatism, bilateral: Secondary | ICD-10-CM | POA: Diagnosis not present

## 2017-11-20 ENCOUNTER — Other Ambulatory Visit: Payer: Self-pay | Admitting: Cardiovascular Disease

## 2017-11-20 DIAGNOSIS — E291 Testicular hypofunction: Secondary | ICD-10-CM | POA: Diagnosis not present

## 2017-11-20 DIAGNOSIS — R972 Elevated prostate specific antigen [PSA]: Secondary | ICD-10-CM | POA: Diagnosis not present

## 2017-11-20 DIAGNOSIS — N400 Enlarged prostate without lower urinary tract symptoms: Secondary | ICD-10-CM | POA: Diagnosis not present

## 2017-11-20 DIAGNOSIS — N5201 Erectile dysfunction due to arterial insufficiency: Secondary | ICD-10-CM | POA: Diagnosis not present

## 2017-11-28 DIAGNOSIS — S0501XA Injury of conjunctiva and corneal abrasion without foreign body, right eye, initial encounter: Secondary | ICD-10-CM | POA: Diagnosis not present

## 2017-11-28 DIAGNOSIS — T1511XA Foreign body in conjunctival sac, right eye, initial encounter: Secondary | ICD-10-CM | POA: Diagnosis not present

## 2017-12-24 DIAGNOSIS — G4733 Obstructive sleep apnea (adult) (pediatric): Secondary | ICD-10-CM | POA: Diagnosis not present

## 2017-12-30 DIAGNOSIS — L821 Other seborrheic keratosis: Secondary | ICD-10-CM | POA: Diagnosis not present

## 2017-12-30 DIAGNOSIS — D485 Neoplasm of uncertain behavior of skin: Secondary | ICD-10-CM | POA: Diagnosis not present

## 2017-12-30 DIAGNOSIS — Z85828 Personal history of other malignant neoplasm of skin: Secondary | ICD-10-CM | POA: Diagnosis not present

## 2017-12-30 DIAGNOSIS — Z85820 Personal history of malignant melanoma of skin: Secondary | ICD-10-CM | POA: Diagnosis not present

## 2017-12-30 DIAGNOSIS — C4441 Basal cell carcinoma of skin of scalp and neck: Secondary | ICD-10-CM | POA: Diagnosis not present

## 2017-12-30 DIAGNOSIS — L570 Actinic keratosis: Secondary | ICD-10-CM | POA: Diagnosis not present

## 2018-01-27 ENCOUNTER — Ambulatory Visit: Payer: Medicare HMO | Admitting: Cardiovascular Disease

## 2018-01-27 ENCOUNTER — Encounter: Payer: Self-pay | Admitting: Cardiovascular Disease

## 2018-01-27 ENCOUNTER — Encounter (INDEPENDENT_AMBULATORY_CARE_PROVIDER_SITE_OTHER): Payer: Self-pay

## 2018-01-27 VITALS — BP 152/72 | HR 55 | Ht 72.0 in | Wt 211.8 lb

## 2018-01-27 DIAGNOSIS — M25471 Effusion, right ankle: Secondary | ICD-10-CM | POA: Diagnosis not present

## 2018-01-27 DIAGNOSIS — M25472 Effusion, left ankle: Secondary | ICD-10-CM

## 2018-01-27 DIAGNOSIS — I10 Essential (primary) hypertension: Secondary | ICD-10-CM | POA: Diagnosis not present

## 2018-01-27 DIAGNOSIS — I480 Paroxysmal atrial fibrillation: Secondary | ICD-10-CM | POA: Diagnosis not present

## 2018-01-27 DIAGNOSIS — Z79899 Other long term (current) drug therapy: Secondary | ICD-10-CM | POA: Diagnosis not present

## 2018-01-27 MED ORDER — SPIRONOLACTONE 25 MG PO TABS: 12.5000 mg | ORAL_TABLET | Freq: Every day | ORAL | 3 refills | Status: DC

## 2018-01-27 NOTE — Progress Notes (Signed)
Patient ID: Mario [name], male   DOB: 1940/12/28, 77 y.o.   MRN: [id number]     Primary M.D.: Dr. Mallie Mussel   HPI:  Mario [name] is a 77 y.o. male who presents for a 7 month follow-up cardiology evaluation.  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  He underwent endoscopy by Dr. Erskine Emery and was noted to have an irregular heart rhythm with  sinus rhythm with PVCs and PACs when connected to the monitor.  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  He had undergone a cardiac catheterization in approximately 1985.  Last year  noticed chest tightness when working out and also had noticed significant increasing shortness of breath.  He has been aware of a somewhat irregular rhythm.  Over several months.  In July he was recently treated with diverticulitis with Cipro and Flagyl and had noticed increased palpitations at that time.  When I saw for initial evaluation on 09/27/2014 he was in atrial fibrillation which he was completely unaware of.  On further questioning, he omitted that he snores very loudly and has had significant episodes of witnessed apnea.  He can fall asleep during the day at any time if circumstances permit.  He has a history of hypertension.  Recently he had been taking atenolol 150 mg in the morning had taken testosterone injections once a week.  I changed his atenolol to 100 mg in the morning and 50 mg grams at night.  He was significantly hypertensive and I added amlodipine 5 mg to his medical regimen.  I scheduled him for a nuclear perfusion study which was done in 10/07/2014.  There were no ECG changes.  There was a small defect of mild severity in the basal inferior wall.  It was felt most likely this was diaphragmatic attenuation.  Ejection fraction was 59%.  An echo Doppler study revealed an EF of  50-55% and raise the possibility of mild posterior basal hypokinesis.  He had mild AR and mild MR.  He was started on eliquis at his office visit.  On follow-up evaluation, his blood pressure was still elevated.  I increased his amlodipine to 7.5 mg in the morning and also started him on losartan 50 mg daily.  He underwent a sleep study which confirm my suspicion for obstructive sleep apnea.  Overall sleep apnea was mild with an AHI of 12.3 , but events were moderate with supine sleep with an AHI of 22.8 per hour. He has significant oxygen desaturation to a nadir of 81% and on the initial study had reduced sleep efficiency at 61.8% and evidence for loud snoring.  He subsequently underwent a CPAP titration trial and he was titrated up to 8 cm water pressure with excellent result.  He underwent successful cardioversion for his atrial fibrillation  On 12/01/2014. He admits to one brief episode following the cardioversion while sleeping before CPAP therapy was initiated and this had reverted back to normal rhythm in the morning when he awakened  CPAP was initiated on November 14, 2014. He has a ResMed Airfit P10 nasalpillow mask, medium size. A download was reviewed from 12/12/2014 through 01/10/2015. His compliance is excellent at 100% of usage.  He is averaging 8 hours and 24 minutes.  His AHI was still mildly elevated at 6.5, but  significant improved initially.  There is no significant leak.  He had undergone biopsy of his laryngeal nodule, which was negative.  When I saw him in January 2017, he complained of several days of being back in atrial fibrillation which seem to be precipitated after prolonged sneezing spell.  When I saw him, with his recurrent AF and normal LV function.  I recommended initiation of flecanide at 50 mg twice a day.  He was advised to reduce his atenolol from 75 g twice a day to 50 mg twice a day for 2 days and on the third day to reduce his atenolol to 25 g twice a day.  After 2  doses of flecanide he felt that his heart rhythm had again stabilized.  A CPAP download  from January 24, 2015 through 02/19/2015 demonstrated 100% compliance.  He averaged 9 hours of sleep per night.  He is set at an auto mode and his 95th percentile pressure was 13.7 with a maximum 16.9.  AHI was 7.9.  Of note, one week ago when I saw the patient.  We discussed turning off his ramp since at the beginning of his sleep.  He was having some difficulty getting adequate air. I rechecked a download for the past week when the has been off, AHI was slightly improved at 5.8/hr.  He feels well.  He has more energy.  He is exercising regularly.  He believes his sleep is good quality.  He denies residual daytime sleepiness.  In April 2016 he had been under increased mental stress and felt that  he went back into atrial fibrillation for approximately 12 hours, but then ultimately his rhythm stabilized.  When I saw him 6 weeks ago, he was in sinus rhythm.  At that time I increased his flecainide to 75 mg twice a day.  I also scheduled him for routine treadmill test to make certain he did not have any flecanide induced proarrhythmia.  This did not demonstrate any exercise-induced arrhythmia.  He was noted to have upsloping ST segment depression of approximately 1 mm with rapid resolution in less than 1 minute in early recovery.  Previously, his nuclear perfusion study did not show any ischemia.  He feels that his mental stress has improved since his daughter seems to be reconciling with her husband and is no longer planning to go through a divorce.  He denies any chest pain or shortness of breath.    When I saw him in October 2018.  He was unaware of any recurrent episodes of atrial fibrillation.  However, at times he had experienced an occasional skipped beat.   He was cutting wood and accidentally cut off the distal aspect of his fourth finger of his left hand several months ago.  He also had been taking testosterone  prescribed by his urologist, but recently his hemoglobin had increased and he reduced his testosterone dose in half.  He has continued to be on flecainide 75 mg twice a day in addition to eliquis 5 mg twice a day for anticoagulation.  He also has been on amlodipine 7.5 mg, and losartan 100 mg in addition to atenolol 6.25 mg twice a day for hypertension.  He continues to use CPAP and cannot sleep without it.  In his words "a life changor."  His CPAP set up day was 03/04/2016.  A download was obtained in the office today from April 9 through 06/18/2017.  He is 100% compliant in sleeping 8 hours and 13 minutes.  He  has a ResMed AirSense 10 AutoSet unit with a minimum pressure at 8 and maximum of 20.  95th percentile pressure was 12.6 with a maximum average pressure at 14.6.  AHI was 3.2 but there were days of significant leak.  He just recently got a new mask.  He admits to being under increased stress.  His dad is been in hospice.   Since I last saw him in May 2019, his wife' dad unfortunately died on September 21, 2017 at age 68, 1 month prior to turning 19. This resulted in increased stress to Mario [name].  His blood pressure was noted to be somewhat labile during this.[name]  He is still working out at least 3 days/week lifting weights.  He continues to use CPAP with 100% compliance.  He states blood pressure typically has been in the 140s.  He needs to transient ankle edema particularly if he stands up for long duration while singing in the choir.  He is unaware of any breakthrough atrial fibrillation or rhythm disturbance.  He presents for evaluation.  Past Medical History:  Diagnosis Date  . Arthritis    neck  . Dysrhythmia    afib - cardioverted 01/2015  . History of kidney stones   . Hypertension   . Melanoma ([place]) 2000   back; stage 3, squamous 1 - basal- several  . Neuromuscular disorder ([place])   . Phlebitis   . Pneumonia   . Seasonal allergies   . Shortness of breath dyspnea    with exertion  .  Tubular adenoma of colon 2013   colonoscpy in 5 years [name]    Past Surgical History:  Procedure Laterality Date  . 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 CARDIOVERSION N/A 12/01/2014   Procedure: CARDIOVERSION;  Surgeon: Troy Sine, MD;  Location: [facility];  Service: Cardiovascular;  Laterality: N/A;  . CATARACT EXTRACTION  2002, 2004   bilateral  . CYSTOSCOPY    . ELBOW SURGERY Left 1984   tendon repair left arm  . INCISION AND DRAINAGE OF WOUND Left 08/28/2016   Procedure: IRRIGATION AND DEBRIDEMENT WOUND;  Surgeon: Iran Planas, MD;  Location: [facility];  Service: Orthopedics;  Laterality: Left;  . LITHOTRIPSY    . melanoma removal  2000   back  . METACARPAL OSTEOTOMY Left 08/28/2016   Procedure: 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.;  Surgeon: Iran Planas, MD;  Location: [facility];  Service: Orthopedics;  Laterality: Left;  [name] MICROLARYNGOSCOPY WITH LASER N/A 01/25/2015   Procedure: MICROLARYNGOSCOPY ;  Surgeon: Melida Quitter, MD;  Location: [facility];  Service: ENT;  Laterality: N/A;  Microlaryngoscopy with 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 with CO2 laser   . TENDON REPAIR Right 2012   right  . TONSILLECTOMY    . UPPER GI ENDOSCOPY      Allergies  Allergen Reactions  . Lisinopril     REACTION: severe GI upset requiring EGD--temporally related to lisinopril--tolerated micardis    Current Outpatient Medications  Medication Sig Dispense Refill  . amLODipine (NORVASC) 10 MG tablet Take 1 tablet (10 mg total) by mouth daily. 90 tablet 3  . atenolol (TENORMIN) 25 MG tablet Take 6.25 mg by mouth 2 (two) times daily.     [name] ELIQUIS 5 MG TABS tablet TAKE 1 TABLET BY MOUTH TWICE DAILY 180 tablet 1  . flecainide (TAMBOCOR) 50 MG tablet TAKE 1 & 1/2 (ONE & ONE-HALF)  TABLETS BY MOUTH TWICE DAILY 270 tablet 1  . GLUCOSAMINE-CHONDROITIN PO Take 1 tablet by mouth daily.     [name] losartan (COZAAR) 100 MG tablet TAKE  1 TABLET BY MOUTH ONCE DAILY 90 tablet 1  . Multiple Vitamin (MULTIVITAMIN) tablet Take 1 tablet by mouth daily.    . Saw Palmetto, Serenoa repens, (SAW PALMETTO PO) Take 1 tablet by mouth daily.    [name] testosterone cypionate (DEPOTESTOSTERONE CYPIONATE) 200 MG/ML injection Inject 50 mg into the muscle once a week.     . spironolactone (ALDACTONE) 25 MG tablet Take 0.5 tablets (12.5 mg total) by mouth daily. 45 tablet 3   Current Facility-Administered Medications  Medication Dose Route Frequency Provider Last Rate Last Dose  . 0.9 %  sodium chloride infusion  500 mL Intravenous Once Doran Stabler, MD        Social History   Socioeconomic History  . Marital status: Married    Spouse name: Not on file  . Number of children: 2  . Years of education: Not on file  . Highest education level: Not on file  Occupational History  . Occupation: retired  Scientific laboratory technician  . Financial resource strain: Not on file  . Food insecurity:    Worry: Not on file    Inability: Not on file  . Transportation needs:    Medical: Not on file    Non-medical: Not on file  Tobacco Use  . Smoking status: Former Smoker    Start date: 11/20/1976  . Smokeless tobacco: Never Used  . Tobacco comment: quit smoking 1986 ish  Substance and Sexual Activity  . Alcohol use: Yes    Alcohol/week: 3.0 - 4.0 standard drinks    Types: 3 - 4 Standard drinks or equivalent per week    Comment: occ   . Drug use: No  . Sexual activity: Not on file  Lifestyle  . Physical activity:    Days per week: Not on file    Minutes per session: Not on file  . Stress: Not on file  Relationships  . Social connections:    Talks on phone: Not on file    Gets together: Not on file    Attends religious service: Not on file    Active member of club or organization: Not on file    Attends meetings of clubs or organizations: Not on file    Relationship status: Not on file  . Intimate partner violence:    Fear of current or ex partner:  Not on file    Emotionally abused: Not on file    Physically abused: Not on file    Forced sexual activity: Not on file  Other Topics Concern  . Not on file  Social History Narrative  . Not on file   Additional social history is notable in that he is married for 42 years.  He has 2 children and 2 grandchildren.  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  He completed 12th grade of education.  Remotely he had smoked but quit in 1977.  He drinks 3 beers per day.  He has been exercising fairly regularly at least 4 days per week including weightlifting and cardio for~ 2 hours per session.  Family History  Problem Relation Age of Onset  . Heart disease Mother   . Heart disease Sister   . Heart disease Sister   . Hypertension Sister   . Colon cancer Neg Hx   .  Stomach cancer Neg Hx    Family history is notable that his mother died at age 66 with heart disease and had atrial fibrillation and congestive heart failure.  Father died at 38 with a brain tumor.  He has 2 sisters and both have hypertension.   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 due to a sawing accident Hematologic/Oncologic: Negative; no easy bruising, bleeding Endocrine: Negative; no heat/cold intolerance; no diabetes Neuro: Negative; no changes in balance, headaches Skin: Negative; No rashes or skin lesions Psychiatric: Negative; No behavioral problems, depression Sleep: Positive for previously loud snoring, frequent awakenings, nonrestorative sleep, and daytime sleepiness,  hypersomnolence, now on CPAP therapy with resolution of symptoms; no bruxism, restless legs, hypnogagnic hallucinations Other comprehensive 14 point system review is negative   Physical Exam BP (!) 152/72   Pulse (!) 55   Ht 6' (1.829 m)   Wt 211 lb 12.8 oz (96.1 kg)   BMI 28.73 kg/m   Blood pressure by me was 156/76.  Wt Readings from Last 3 Encounters:  01/27/18 211 lb 12.8 oz (96.1 kg)  06/19/17 209 lb 12.8 oz (95.2 kg)  02/18/17 208 lb (94.3 kg)   General: Alert, oriented, no distress.  Skin: normal turgor, no rashes, warm and dry HEENT: Normocephalic, atraumatic. Pupils equal round and reactive to light; sclera anicteric; extraocular muscles intact;  Nose without nasal septal hypertrophy Mouth/Parynx benign; Mallinpatti scale 3 Neck: No JVD, no carotid bruits; normal carotid upstroke Lungs: clear to ausculatation and percussion; no wheezing or rales Chest wall: without tenderness to palpitation Heart: PMI not displaced, RRR, s1 s2 normal, 1/6 systolic murmur, no diastolic murmur, no rubs, gallops, thrills, or heaves Abdomen: soft, nontender; no hepatosplenomehaly, BS+; abdominal aorta nontender and not dilated by palpation. Back: no CVA tenderness Pulses 2+ Musculoskeletal: full range of motion, normal strength, no joint deformities Extremities: no clubbing cyanosis or edema, Homan's sign negative  Neurologic: grossly nonfocal; Cranial nerves grossly wnl Psychologic: Normal mood and affect   ECG (independently read by me): Sinus bradycardia 55 bpm.  No ectopy.  Normal intervals.  May 2019 ECG (independently read by me): Bradycardia 56 bpm.  Nonspecific interventricular conduction delay.  QTc interval not 391 ms  October 2018 ECG (independently read by me): Sinus bradycardia 56 bpm.  No ectopy.  Normal intervals per  06/05/2016 ECG (independently read by me): Normal sinus rhythm at 61 bpm.  Nonspecific T changes.  QTc interval 390 ms.  October 2017 ECG (independently  read by me): Sinus bradycardia 57 bpm.  Nonspecific ST changes.  QTc interval 393 ms.  06/30/2015 ECG (independently read by me): Sinus bradycardia at 56 bpm.  No significant ST-T changes.  QTc interval 389 ms.  05/16/2015 ECG (independently read by me): Sinus bradycardia 59 bpm.  QTc interval 376 ms.  No significant ST changes.  02/23/2015 ECG (independently read by me): Sinus bradycardia 59 bpm.  QTc interval 390 ms.  Nonspecific T changes.  02/17/15 ECG (independently read by me): Atrial fibrillation at 58 bpm.  Nonspecific ST changes.  QTc interval 380 ms.  01/12/2015 ECG  (independently read by me):  Normal sinus rhythm at 62 bpm.  Nonspecific ST-T changes.  September 2016 ECG (independently read by me): Atrial fibrillation with a slow ventricular response in the 50s.  QTc interval 364 ms.  Prior CG (independently read by me):  Atrial fibrillation with ventricular rate at 52 bpm.  No significant ST-T change.  09/27/2014 ECG (independently read by me): Atrial fibrillation with ventricular rate at 59 bpm.  Nonspecific ST-T changes.  LABS:  BMP Latest Ref Rng & Units 06/13/2017 08/28/2016 11/28/2015  Glucose 65 - 99 mg/dL 77 103(H) 84  BUN 8 - 27 mg/dL '13 12 18  ' Creatinine 0.76 - 1.27 mg/dL 1.05 1.18 1.18  BUN/Creat Ratio 10 - 24 12 - -  Sodium 134 - 144 mmol/L 138 135 136  Potassium 3.5 - 5.2 mmol/L 4.6 4.2 4.7  Chloride 96 - 106 mmol/L 100 105 100  CO2 20 - 29 mmol/L '24 22 25  ' Calcium 8.6 - 10.2 mg/dL 9.5 9.0 9.1     Hepatic Function Latest Ref Rng & Units 06/13/2017 11/28/2015 02/17/2015  Total Protein 6.0 - 8.5 g/dL 6.8 6.6 6.8  Albumin 3.5 - 4.8 g/dL 4.4 3.8 4.1  AST 0 - 40 IU/L '23 25 20  ' ALT 0 - 44 IU/L '15 15 19  ' Alk Phosphatase 39 - 117 IU/L 71 60 67  Total Bilirubin 0.0 - 1.2 mg/dL 0.8 0.7 0.9    CBC Latest Ref Rng & Units 06/13/2017 08/28/2016 11/28/2015  WBC 3.4 - 10.8 x10E3/uL 5.2 9.1 5.0  Hemoglobin 13.0 - 17.7 g/dL 16.2 16.9 16.2  Hematocrit 37.5 - 51.0 % 46.3 47.6 48.0    Platelets 150 - 379 x10E3/uL 215 166 198   Lab Results  Component Value Date   MCV 93 06/13/2017   MCV 91.9 08/28/2016   MCV 93.9 11/28/2015   Lab Results  Component Value Date   TSH 3.360 06/13/2017   Lab Results  Component Value Date   HGBA1C 4.9 05/06/2006     BNP No results found for: BNP  ProBNP No results found for: PROBNP   Lipid Panel     Component Value Date/Time   CHOL 155 06/13/2017 0951   TRIG 132 06/13/2017 0951   HDL 45 06/13/2017 0951   CHOLHDL 3.3 11/28/2015 0943   VLDL 24 11/28/2015 0943   LDLCALC 84 06/13/2017 0951    RADIOLOGY: No results found.  IMPRESSION:  1. Paroxysmal atrial fibrillation (HCC)   2. Essential hypertension   3. Medication management   4. Swelling of both ankles     ASSESSMENT AND PLAN: Mario [name] is an active young appearing 77 year old male who was found to be in atrial fibrillation in August 2016 and was started on anticoagulation.  He had undergone initial cardioversion in October 2016 and develop recurrent atrial fibrillation in January 2017 converted to sinus rhythm with institution of flecanide.  He is maintaining sinus rhythm on flecainide 75 mg twice a day in addition to low-dose beta blocker therapy with atenolol 12.5 mg daily (he is taking 6.25 mg twice a day).  Most recently, he has also been on amlodipine 10 mg in addition to losartan 100 mg daily for hypertension.  He has had recent blood pressure lability which seem to be accentuated this summer after his father's death.  He remains active and continues to lift weights aggressively.  With his blood pressure elevation, I have suggested the addition of spironolactone 12.5 mg daily which will be helpful for blood pressure as well as potential diastolic dysfunction.  He continues to use CPAP  with 100% compliance and is unaware of any residual daytime sleepiness.  His sleep is restorative.  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 Nevada Crane of dermatology.  In 2 weeks have suggested a follow-up be met following initiation of spironolactone.  In 3 months I am scheduling him for an echo Doppler study.  His last echo assessment was in 2016.  See him in the office in follow-up and further recommendations were made at that time.   Time spent: 62mnutes TTroy Sine MD, FMedical Plaza Endoscopy Unit LLC12/17/2019 7:22 PM

## 2018-01-27 NOTE — Patient Instructions (Signed)
Medication Instructions:   START TAKING SPIRONOLACTONE 12.5 MG ( 1/2 TABLET OF 25 MG ) DAILY    If you need a refill on your cardiac medications before your next appointment, please call your pharmacy.   Lab work:  [employer]- IN 2 WEEKS    If you have labs (blood work) drawn today and your tests are completely normal, you will receive your results only by: [name] MyChart Message (if you have MyChart) OR . A paper copy in the mail If you have any lab test that is abnormal or we need to change your treatment, we will call you to review the results.  Testing/Procedures: SCHEDULE AT Terry MARCH 2020 Your physician has requested that you have an echocardiogram. Echocardiography is a painless test that uses sound waves to create images of your heart. It provides your doctor with information about the size and shape of your heart and how well your heart's chambers and valves are working. This procedure takes approximately one hour. There are no restrictions for this procedure.    Follow-Up: At [facility], you and your health needs are our priority.  As part of our continuing mission to provide you with exceptional heart care, we have created designated Provider Care Teams.  These Care Teams include your primary Cardiologist (physician) and Advanced Practice Providers (APPs -  Physician Assistants and Nurse Practitioners) who all work together to provide you with the care you need, when you need it. You will need a follow up appointment in March 24 ,2020 at 10:40 am.  Please call our office 2 months in advance to schedule this appointment.  You may see Shelva Majestic, MDor one of the following Advanced Practice Providers on your designated Care Team: Almyra Deforest, [place] . Fabian Sharp, PA-C  Any Other Special Instructions Will Be Listed Below (If Applicable).

## 2018-02-07 DIAGNOSIS — J209 Acute bronchitis, unspecified: Secondary | ICD-10-CM | POA: Diagnosis not present

## 2018-02-07 DIAGNOSIS — T1502XA Foreign body in cornea, left eye, initial encounter: Secondary | ICD-10-CM | POA: Diagnosis not present

## 2018-02-09 DIAGNOSIS — M25472 Effusion, left ankle: Secondary | ICD-10-CM | POA: Diagnosis not present

## 2018-02-09 DIAGNOSIS — M25471 Effusion, right ankle: Secondary | ICD-10-CM | POA: Diagnosis not present

## 2018-02-09 DIAGNOSIS — I480 Paroxysmal atrial fibrillation: Secondary | ICD-10-CM | POA: Diagnosis not present

## 2018-02-09 DIAGNOSIS — I10 Essential (primary) hypertension: Secondary | ICD-10-CM | POA: Diagnosis not present

## 2018-02-09 DIAGNOSIS — Z79899 Other long term (current) drug therapy: Secondary | ICD-10-CM | POA: Diagnosis not present

## 2018-02-09 LAB — BASIC METABOLIC PANEL
BUN/Creatinine Ratio: 19 (ref 10–24)
BUN: 24 mg/dL (ref 8–27)
CO2: 20 mmol/L (ref 20–29)
Calcium: 9.9 mg/dL (ref 8.6–10.2)
Chloride: 101 mmol/L (ref 96–106)
Creatinine, Ser: 1.24 mg/dL (ref 0.76–1.27)
GFR calc Af Amer: 64 mL/min/{1.73_m2} (ref >59)
GFR calc non Af Amer: 56 mL/min/{1.73_m2} — ABNORMAL LOW (ref >59)
Glucose: 87 mg/dL (ref 65–99)
POTASSIUM: 4.7 mmol/L (ref 3.5–5.2)
Sodium: 136 mmol/L (ref 134–144)

## 2018-03-10 DIAGNOSIS — R69 Illness, unspecified: Secondary | ICD-10-CM | POA: Diagnosis not present

## 2018-03-11 ENCOUNTER — Emergency Department (HOSPITAL_COMMUNITY)
Admission: EM | Admit: 2018-03-11 | Discharge: 2018-03-11 | Disposition: A | Discharge status: Home / Self Care | Payer: Medicare HMO | Attending: Emergency Medicine | Admitting: Emergency Medicine

## 2018-03-11 ENCOUNTER — Ambulatory Visit: Payer: Medicare HMO | Admitting: Gastroenterology

## 2018-03-11 ENCOUNTER — Telehealth: Payer: Self-pay | Admitting: Gastroenterology

## 2018-03-11 ENCOUNTER — Encounter (HOSPITAL_COMMUNITY): Payer: Self-pay

## 2018-03-11 ENCOUNTER — Emergency Department (HOSPITAL_COMMUNITY): Payer: Medicare HMO

## 2018-03-11 DIAGNOSIS — R55 Syncope and collapse: Secondary | ICD-10-CM | POA: Insufficient documentation

## 2018-03-11 DIAGNOSIS — Z79899 Other long term (current) drug therapy: Secondary | ICD-10-CM | POA: Insufficient documentation

## 2018-03-11 DIAGNOSIS — Z87891 Personal history of nicotine dependence: Secondary | ICD-10-CM | POA: Insufficient documentation

## 2018-03-11 DIAGNOSIS — R0602 Shortness of breath: Secondary | ICD-10-CM | POA: Diagnosis not present

## 2018-03-11 DIAGNOSIS — R0789 Other chest pain: Secondary | ICD-10-CM | POA: Insufficient documentation

## 2018-03-11 DIAGNOSIS — I10 Essential (primary) hypertension: Secondary | ICD-10-CM | POA: Diagnosis not present

## 2018-03-11 DIAGNOSIS — I4819 Other persistent atrial fibrillation: Secondary | ICD-10-CM

## 2018-03-11 LAB — CBC WITH DIFFERENTIAL/PLATELET
ABS IMMATURE GRANULOCYTES: 0.08 10*3/uL — AB (ref 0.00–0.07)
BASOS PCT: 1 %
Basophils Absolute: 0 10*3/uL (ref 0.0–0.1)
Eosinophils Absolute: 0.2 10*3/uL (ref 0.0–0.5)
Eosinophils Relative: 3 %
HCT: 47 % (ref 39.0–52.0)
HEMOGLOBIN: 15.5 g/dL (ref 13.0–17.0)
Immature Granulocytes: 1 %
Lymphocytes Relative: 22 %
Lymphs Abs: 1.5 10*3/uL (ref 0.7–4.0)
MCH: 31.5 pg (ref 26.0–34.0)
MCHC: 33 g/dL (ref 30.0–36.0)
MCV: 95.5 fL (ref 80.0–100.0)
Monocytes Absolute: 0.9 10*3/uL (ref 0.1–1.0)
Monocytes Relative: 13 %
NEUTROS ABS: 4.3 10*3/uL (ref 1.7–7.7)
Neutrophils Relative %: 60 %
PLATELETS: 189 10*3/uL (ref 150–400)
RBC: 4.92 MIL/uL (ref 4.22–5.81)
RDW: 13.5 % (ref 11.5–15.5)
WBC: 7 10*3/uL (ref 4.0–10.5)
nRBC: 0 % (ref 0.0–0.2)

## 2018-03-11 LAB — COMPREHENSIVE METABOLIC PANEL
ALBUMIN: 3.6 g/dL (ref 3.5–5.0)
ALT: 28 U/L (ref 0–44)
AST: 27 U/L (ref 15–41)
Alkaline Phosphatase: 61 U/L (ref 38–126)
Anion gap: 7 (ref 5–15)
BILIRUBIN TOTAL: 0.9 mg/dL (ref 0.3–1.2)
BUN: 20 mg/dL (ref 8–23)
CO2: 27 mmol/L (ref 22–32)
Calcium: 9.3 mg/dL (ref 8.9–10.3)
Chloride: 103 mmol/L (ref 98–111)
Creatinine, Ser: 1.24 mg/dL (ref 0.61–1.24)
GFR calc Af Amer: >60 mL/min (ref >60)
GFR calc non Af Amer: 56 mL/min — ABNORMAL LOW (ref >60)
Glucose, Bld: 98 mg/dL (ref 70–99)
Potassium: 4.2 mmol/L (ref 3.5–5.1)
Sodium: 137 mmol/L (ref 135–145)
TOTAL PROTEIN: 6.9 g/dL (ref 6.5–8.1)

## 2018-03-11 LAB — CBG MONITORING, ED: Glucose-Capillary: 80 mg/dL (ref 70–99)

## 2018-03-11 LAB — TROPONIN I: Troponin I: <0.03 ng/mL (ref <0.03)

## 2018-03-11 LAB — PROTIME-INR
INR: 1.17
PROTHROMBIN TIME: 14.8 s (ref 11.4–15.2)

## 2018-03-11 LAB — MAGNESIUM: Magnesium: 1.8 mg/dL (ref 1.7–2.4)

## 2018-03-11 LAB — BRAIN NATRIURETIC PEPTIDE: B Natriuretic Peptide: 250 pg/mL — ABNORMAL HIGH (ref 0.0–100.0)

## 2018-03-11 MED ORDER — PROPOFOL 10 MG/ML IV BOLUS
INTRAVENOUS | Status: AC
  Filled 2018-03-11: qty 20

## 2018-03-11 MED ORDER — PROPOFOL 10 MG/ML IV BOLUS
INTRAVENOUS | Status: AC | PRN
  Administered 2018-03-11: 95.3 mg via INTRAVENOUS

## 2018-03-11 MED ORDER — FLECAINIDE ACETATE 100 MG PO TABS
100.0000 mg | ORAL_TABLET | Freq: Once | ORAL | Status: AC
  Administered 2018-03-11: 100 mg via ORAL
  Filled 2018-03-11: qty 1

## 2018-03-11 MED ORDER — ASPIRIN 81 MG PO CHEW
324.0000 mg | CHEWABLE_TABLET | Freq: Once | ORAL | Status: AC
  Administered 2018-03-11: 324 mg via ORAL
  Filled 2018-03-11: qty 4

## 2018-03-11 MED ORDER — FLECAINIDE ACETATE 100 MG PO TABS: 100.0000 mg | ORAL_TABLET | Freq: Two times a day (BID) | ORAL | 3 refills | Status: DC

## 2018-03-11 NOTE — ED Notes (Signed)
Cardiology bedside at this time

## 2018-03-11 NOTE — Sedation Documentation (Signed)
Pt shocked at [place], synced

## 2018-03-11 NOTE — Consult Note (Addendum)
Cardiology Consult:   [name] MRN: [id number]; DOB: Dec 07, 1940  Admit date: 03/11/2018 Date of Consult: 03/11/2018  Primary Care Provider: Dickie La, MD Primary Cardiologist: Mario Majestic, MD  Primary Electrophysiologist:  None    [name] Profile:   Mario [name] is a 78 y.o. male with a hx of PAF on flecainide and Eliquis, hypertension, OSA on CPAP who is being seen today for Mario evaluation of near syncope at Mario request of Dr. Vanita [name].  History of Present Illness:   Mario [name] presented to Mario ED today with complaints of near syncope. He had been feeling well until Mario past few days he has felt persistent A.fib like symptoms with near syncope and then chest pain today.  Upon my evaluation Mario [name] tells me that beginning about a month ago after having bronchitis and ear infection treated with antibiotics and inhaler, he has been feeling like he is in and out of A. fib.  Yesterday he felt more strongly that he was going in and out of A. fib with occasional strong thump on his chest.  He went to bed last night and then this morning when he woke up he was feeling weak.  While making breakfast he felt weak and lightheaded and had to sit down on Mario floor.  He had chest tightness, "not pain" this morning.  He says that he had an intense workout on Monday with heavy weightlifting and he is not sure if he strained something in his chest.   Mario [name] states that this is Mario same feeling that he had in 2016 and 2017 when he went into A. fib.  He says that he is very symptomatic with his A. fib and knows when he is in it.  He says that there was some confusion on his atenolol dose.  He says that he had been taking 6.25 mg twice a day, however when he picked it up at one point it was 100 mg so after clarification with our office he was told to take 12.5 mg twice a day, so he has been cutting Mario pills in two fourths.  In Mario ED telemetry is showing A. fib in Mario low 50s-60s with  pauses up to 2.6 seconds.  Mario [name] is followed in our office by Mario [name] who first saw him in 2016 for atrial fibrillation that Mario [name] had no awareness of.  He had a nuclear perfusion study on 10/07/2014.  There were no ECG changes.  There was a small defect of mild severity in Mario basal inferior wall.  It was felt most likely this was diaphragmatic attenuation.  Ejection fraction was 59%.  An echo Doppler study revealed an EF of 50-55% and raise Mario possibility of mild posterior basal hypokinesis.  He had mild AR and mild MR.  He was started on eliquis at his office visit.  He was found to have OSA and started on CPAP. He underwent successful cardioversion for his atrial fibrillation  On 12/01/2014. He had recurrent afib in 2017 and he was started on flecainide. He was last seen in Mario office on 01/27/18 at which time he was noted to be still exercising 3 days/week and compliant with CPAP. He was in SB at 55 bpm with no ectopy. His BP was elevated and spironolactone was added to his amlodipine, atenolol and losartan, however he stopped spironolactone after having lost 18 pounds in only a few days.  In Mario ED troponin is negative and BNP is  250. Electrolytes are normal and renal function is at Mario high end of normal. He is not anemic with Hgb 15.5. WBCs 7.0. CXR shows no active disease.  EKG shows Atrial fibrillation with Incomplete left bundle branch block, 59 bpm,  QTC 394  Past Medical History:  Diagnosis Date  . Arthritis    neck  . Dysrhythmia    afib - cardioverted 01/2015  . History of kidney stones   . Hypertension   . Melanoma ([place]) 2000   back; stage 3, squamous 1 - basal- several  . Neuromuscular disorder ([place])   . Phlebitis   . Pneumonia   . Seasonal allergies   . Shortness of breath dyspnea    with exertion  . Tubular adenoma of colon 2013   colonoscpy in 5 years [name]    Past Surgical History:  Procedure Laterality Date  . 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 CARDIOVERSION N/A 12/01/2014   Procedure: CARDIOVERSION;  Surgeon: Troy Sine, MD;  Location: [facility];  Service: Cardiovascular;  Laterality: N/A;  . CATARACT EXTRACTION  2002, 2004   bilateral  . CYSTOSCOPY    . ELBOW SURGERY Left 1984   tendon repair left arm  . INCISION AND DRAINAGE OF WOUND Left 08/28/2016   Procedure: IRRIGATION AND DEBRIDEMENT WOUND;  Surgeon: Iran Planas, MD;  Location: [facility];  Service: Orthopedics;  Laterality: Left;  . LITHOTRIPSY    . melanoma removal  2000   back  . METACARPAL OSTEOTOMY Left 08/28/2016   Procedure: 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.;  Surgeon: Iran Planas, MD;  Location: [facility];  Service: Orthopedics;  Laterality: Left;  [name] MICROLARYNGOSCOPY WITH LASER N/A 01/25/2015   Procedure: MICROLARYNGOSCOPY ;  Surgeon: Melida Quitter, MD;  Location: [facility];  Service: ENT;  Laterality: N/A;  Microlaryngoscopy with 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 with CO2 laser   . TENDON REPAIR Right 2012   right  . TONSILLECTOMY    . UPPER GI ENDOSCOPY       Home Medications:  Prior to Admission medications   Medication Sig Start Date End Date Taking? Authorizing Provider  amLODipine (NORVASC) 10 MG tablet Take 1 tablet (10 mg total) by mouth daily. 06/19/17  Yes Troy Sine, MD  atenolol (TENORMIN) 25 MG tablet Take 25 mg by mouth 2 (two) times daily.    Yes [provider]  ELIQUIS 5 MG TABS tablet TAKE 1 TABLET BY MOUTH TWICE DAILY [name] taking differently: Take 5 mg by mouth 2 (two) times daily.  11/20/17  Yes Troy Sine, MD  flecainide (TAMBOCOR) 50 MG tablet TAKE 1 & 1/2 (ONE & ONE-HALF) TABLETS BY MOUTH TWICE DAILY [name] taking differently: Take 75 mg by mouth daily.  11/03/17  Yes Troy Sine, MD  GLUCOSAMINE-CHONDROITIN PO Take 1 tablet by mouth daily.    Yes [provider]  losartan (COZAAR) 100 MG tablet TAKE 1 TABLET BY MOUTH ONCE  DAILY [name] taking differently: Take 100 mg by mouth daily.  10/01/17  Yes Troy Sine, MD  Multiple Vitamin (MULTIVITAMIN) tablet Take 1 tablet by mouth daily.   Yes [provider]  Saw Palmetto, Serenoa repens, (SAW PALMETTO PO) Take 1 tablet by mouth daily.   Yes [provider]  testosterone cypionate (DEPOTESTOSTERONE CYPIONATE) 200 MG/ML injection Inject 50 mg into Mario muscle once a week.  08/09/14  Yes [provider]  spironolactone (ALDACTONE) 25 MG tablet Take 0.5 tablets (12.5 mg total) by mouth daily. 01/27/18 04/27/18  Troy Sine, MD    Inpatient Medications: Scheduled Meds:  Continuous Infusions: . sodium chloride     PRN Meds:   Allergies:    Allergies  Allergen Reactions  . Lisinopril Other (See Comments)    REACTION: severe GI upset requiring EGD--temporally related to lisinopril--tolerated micardis    Social History:   Social History   Socioeconomic History  . Marital status: Married    Spouse name: Not on file  . Number of children: 2  . Years of education: Not on file  . Highest education level: Not on file  Occupational History  . Occupation: retired  Scientific laboratory technician  . Financial resource strain: Not on file  . Food insecurity:    Worry: Not on file    Inability: Not on file  . Transportation needs:    Medical: Not on file    Non-medical: Not on file  Tobacco Use  . Smoking status: Former Smoker    Start date: 11/20/1976  . Smokeless tobacco: Never Used  . Tobacco comment: quit smoking 1986 ish  Substance and Sexual Activity  . Alcohol use: Not Currently    Alcohol/week: 3.0 - 4.0 standard drinks    Types: 3 - 4 Standard drinks or equivalent per week  . Drug use: No  . Sexual activity: Not on file  Lifestyle  . Physical activity:    Days per week: Not on file    Minutes per session: Not on file  . Stress: Not on file  Relationships  . Social connections:    Talks on phone: Not on file    Gets together:  Not on file    Attends religious service: Not on file    Active member of club or organization: Not on file    Attends meetings of clubs or organizations: Not on file    Relationship status: Not on file  . Intimate partner violence:    Fear of current or ex partner: Not on file    Emotionally abused: Not on file    Physically abused: Not on file    Forced sexual activity: Not on file  Other Topics Concern  . Not on file  Social History Narrative  . Not on file    Family History:    Family History  Problem Relation Age of Onset  . Heart disease Mother   . Heart disease Sister   . Heart disease Sister   . Hypertension Sister   . Colon cancer Neg Hx   . Stomach cancer Neg Hx      ROS:  Please see Mario history of present illness.   All other ROS reviewed and negative.     Physical Exam/Data:   Vitals:   03/11/18 1100 03/11/18 1115 03/11/18 1145 03/11/18 1245  BP: 125/71 128/73 138/71 128/68  Pulse: 60 (!) 52 (!) 57 61  Resp: 16 18 17 16   SpO2: 96% 96% 97% 97%  Weight:      Height:       No intake or output data in Mario 24 hours ending 03/11/18 1541 Last 3 Weights 03/11/2018 01/27/2018 06/19/2017  Weight (lbs) 210 lb 211 lb 12.8 oz 209 lb 12.8 oz  Weight (kg) 95.255 kg 96.072 kg 95.165 kg     Body mass index is 28.48 kg/m.  General:  Well nourished, well developed, in no acute distress HEENT: normal Lymph: no  adenopathy Neck: no JVD Endocrine:  No thryomegaly Vascular: No carotid bruits; FA pulses 2+ bilaterally without bruits  Cardiac:  normal S1, S2; irregularly irregular rhythm; no murmur  Lungs:  clear to auscultation bilaterally, no wheezing, rhonchi or rales  Abd: soft, nontender, no hepatomegaly  Ext: no edema Musculoskeletal:  No deformities, BUE and BLE strength normal and equal Skin: warm and dry  Neuro:  CNs 2-12 intact, no focal abnormalities noted Psych:  Normal affect   EKG:  Mario EKG was personally reviewed and demonstrates:  Atrial fibrillation  with Incomplete left bundle branch block, 59 bpm,  QTC 394  Telemetry:  Telemetry was personally reviewed and demonstrates: Atrial fibrillation in Mario low 50s-60s with up to 2.6-second pauses  Relevant CV Studies:  Exercise tolerance test 06/08/2015 Study Highlights   Blood pressure demonstrated a hypotensive response to exercise.  Upsloping ST segment depression ST segment depression of 2 mm was noted during stress in Mario III, aVF, V5 and V6 leads, beginning at 10 minutes of stress, and returning to baseline after less than 1 minute of recovery.  Positive stress test concerning for ischemia.     Laboratory Data:  Chemistry Recent Labs  Lab 03/11/18 1113  NA 137  K 4.2  CL 103  CO2 27  GLUCOSE 98  BUN 20  CREATININE 1.24  CALCIUM 9.3  GFRNONAA 56*  GFRAA >60  ANIONGAP 7    Recent Labs  Lab 03/11/18 1113  PROT 6.9  ALBUMIN 3.6  AST 27  ALT 28  ALKPHOS 61  BILITOT 0.9   Hematology Recent Labs  Lab 03/11/18 1113  WBC 7.0  RBC 4.92  HGB 15.5  HCT 47.0  MCV 95.5  MCH 31.5  MCHC 33.0  RDW 13.5  PLT 189   Cardiac Enzymes Recent Labs  Lab 03/11/18 1113  TROPONINI <0.03   No results for input(s): TROPIPOC in Mario last 168 hours.  BNP Recent Labs  Lab 03/11/18 1113  BNP 250.0*    DDimer No results for input(s): DDIMER in Mario last 168 hours.  Radiology/Studies:  Dg Chest Portable 1 View  Result Date: 03/11/2018 CLINICAL DATA:  Chest tightness and shortness of breath. EXAM: PORTABLE CHEST 1 VIEW COMPARISON:  Chest x-ray dated November 09, 2014. FINDINGS: Stable cardiomediastinal silhouette. Normal pulmonary vascularity. No focal consolidation, pleural effusion, or pneumothorax. No acute osseous abnormality. IMPRESSION: No active disease. Electronically Signed   By: Titus Dubin M.D.   On: 03/11/2018 11:37    Assessment and Plan:   1. Near syncope -[name] has history of symptomatic atrial fibrillation that has been well controlled on flecainide  for several years.  About a month ago he had bronchitis and ear infection and has been noting episodes of A. fib since that time.  Yesterday he felt more significant A. fib and today he felt strongly that he was in A. fib and developed weakness and lightheadedness causing him to have to sit on Mario floor while making breakfast.  Mario symptoms are similar to symptoms he has had in Mario past with A. Fib. -[name] had some mild chest tightness this morning, chest pain-free now.  Troponin is negative. -His symptoms seem to be tied to being in afib with Mario slow rate and pauses.   2. Paroxysmal atrial fibrillation -As above [name] is now back in atrial fibrillation and symptomatic.  Rates are in Mario low 50s to 60s with up to 2.6-second pauses.  -Mario [name] notes some confusion on his atenolol dosing which  had been 6.25 mg twice daily in Mario past but recently he has been taking 25 mg twice a day.  -He continues on flecainide 75 mg twice daily -Currently A. fib may be related to his recent respiratory infection with low heart rate due to increase in beta-blocker.  -[name] verbalizes compliance of Eliquis 5 mg twice daily.  He states that he has not missed any doses in Mario last 4 weeks. -Plan to stop atenolol for now and increase Flecainide to 100 mg BID. (Rx sent in to his pharmacy) -Discussed with ED MD and they are agreeable to do cardioversion tonight. If he feels well after, he can then go home. I have discussed  Mario plan with Mario [name] and his wife and they are in agreement. He understands that he will need uninterrupted anticoagulation for at least Mario next 4 weeks. -I will arrange for follow up in a week in our office.   3. Hypertension -Home therapy includes amlodipine 10 mg daily, atenolol 25 mg twice daily, losartan 100 mg daily.  He was recently started on spironolactone 12.5 mg on 01/27/2018 for improved blood pressure control however he stopped taking it due to losing 18 pounds in only a few  days. -Systolic blood pressures [id number]  4. OSA -[name] verbalizes full compliance with CPAP and he benefits from therapy.     For questions or updates, please contact [facility] Please consult [email or web address] for contact info under     Signed, Daune Perch, NP  03/11/2018 3:41 PM

## 2018-03-11 NOTE — ED Notes (Signed)
Consent for procedure signed.

## 2018-03-11 NOTE — ED Provider Notes (Signed)
I was asked by cardiology service to cardiovert patient.  Verbal and written consent was obtained by me planing risks and benefits of propofol sedation and cardioversion.  Patient rested comfortably.  At time of cardioversion.   timeout was performed with verification of patient giving his name and birthdate verbally and also by using his hospital wristband a second identifier.  Suction and airway equipment available at bedside.  He was placed on cardiac monitor end-tidal CO2 monitor pulse oximetry.  Had patent IV line.  He was premedicated with propofol 95 mg IV with good sedation and cardioverted with 200 J.  He tolerated the procedure well.  At 6:15 PM patient is alert Glasgow Coma Score 15.  Cardioversion was successful.  Rhythm after cardioversion normal sinus ED ECG REPORT   Date: 03/11/2018  Rate: 50  Rhythm: sinus bradycardia  QRS Axis: normal  Intervals: normal  ST/T Wave abnormalities: nonspecific T wave changes  Conduction Disutrbances:none  Narrative Interpretation:   Old EKG Reviewed:  AtrialFibrillation has resolved.  Rates lower than previous tracing  I have personally reviewed the EKG tracing and agree with the computerized printout as noted.    Patient received dose of oral flecainide here prior to discharge.  At 8:20 PM he remains in normal sinus rhythm.  Asymptomatic.  He feels ready to go home.  Plan follow-up with cardiology office   Orlie Dakin, MD 03/11/18 2019

## 2018-03-11 NOTE — Telephone Encounter (Signed)
FYI

## 2018-03-11 NOTE — Telephone Encounter (Signed)
Spouse says she will be taking him to ED. Will call back to reschedule later.

## 2018-03-11 NOTE — ED Notes (Signed)
Patient ambulatory to bathroom with steady gait at this time 

## 2018-03-11 NOTE — ED Provider Notes (Signed)
[facility] EMERGENCY DEPARTMENT Provider Note   CSN: [id number] Arrival date & time: 03/11/18  1034     History   Chief Complaint Chief Complaint  Patient presents with  . Near Syncope  . Chest Pain    HPI Mario [name] is a 78 y.o. male.  HPI Patient presents with concern of chest pain, near syncope. He has a history of atrial fibrillation, takes flecainide, including typical dose 2 hours ago. He also takes Eliquis. He notes that he is been doing generally well, aside from the past few days, when he has felt persistent A. fib-like symptoms, with persistent near syncope, and new chest pain starting today, episodic, tight, severe, brief, occurring without clear precipitant. No complete syncope, no sustained chest pain, though he does describe sustained chest pressure. No recent catheterization, though he has had electrophysiology studies.  Past Medical History:  Diagnosis Date  . Arthritis    neck  . Dysrhythmia    afib - cardioverted 01/2015  . History of kidney stones   . Hypertension   . Melanoma ([place]) 2000   back; stage 3, squamous 1 - basal- several  . Neuromuscular disorder ([place])   . Phlebitis   . Pneumonia   . Seasonal allergies   . Shortness of breath dyspnea    with exertion  . Tubular adenoma of colon 2013   colonoscpy in 5 years [name]    Patient Active Problem List   Diagnosis Date Noted  . Swelling of both ankles 07/02/2015  . 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 OSA on CPAP 01/12/2015  . Anticoagulation adequate 11/03/2014  . Atrial fibrillation ([place]) 09/29/2014  . History of colonic polyps 09/20/2014  . 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 Stricture and stenosis of esophagus 06/20/2008  . 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 HYPERTENSION, BENIGN 05/06/2006  . ELEVATED PROSTATE SPECIFIC ANTIGEN 05/06/2006  . Melanoma of skin, site unspecified 04/10/2006  . CATARACT  04/10/2006  . Diverticulitis of colon 04/10/2006  . NEPHROLITHIASIS 04/10/2006    Past Surgical History:  Procedure Laterality Date  . 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 CARDIOVERSION N/A 12/01/2014   Procedure: CARDIOVERSION;  Surgeon: Troy Sine, MD;  Location: [facility];  Service: Cardiovascular;  Laterality: N/A;  . CATARACT EXTRACTION  2002, 2004   bilateral  . CYSTOSCOPY    . ELBOW SURGERY Left 1984   tendon repair left arm  . INCISION AND DRAINAGE OF WOUND Left 08/28/2016   Procedure: IRRIGATION AND DEBRIDEMENT WOUND;  Surgeon: Iran Planas, MD;  Location: [facility];  Service: Orthopedics;  Laterality: Left;  . LITHOTRIPSY    . melanoma removal  2000   back  . METACARPAL OSTEOTOMY Left 08/28/2016   Procedure: 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.;  Surgeon: Iran Planas, MD;  Location: [facility];  Service: Orthopedics;  Laterality: Left;  [name] MICROLARYNGOSCOPY WITH LASER N/A 01/25/2015   Procedure: MICROLARYNGOSCOPY ;  Surgeon: Melida Quitter, MD;  Location: [facility];  Service: ENT;  Laterality: N/A;  Microlaryngoscopy with 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 with CO2 laser   . TENDON REPAIR Right 2012   right  . TONSILLECTOMY    . UPPER GI ENDOSCOPY          Home Medications    Prior to Admission medications   Medication Sig Start Date End Date Taking? Authorizing Provider  amLODipine (NORVASC) 10 MG tablet Take 1 tablet (10 mg total) by mouth daily. 06/19/17  Yes Troy Sine, MD  atenolol (TENORMIN) 25 MG tablet Take 25 mg by mouth 2 (two) times daily.    Yes [provider]  ELIQUIS 5 MG TABS tablet TAKE 1 TABLET BY MOUTH TWICE DAILY Patient taking differently: Take 5 mg by mouth 2 (two) times daily.  11/20/17  Yes Troy Sine, MD  flecainide (TAMBOCOR) 50 MG tablet TAKE 1 & 1/2 (ONE & ONE-HALF) TABLETS BY MOUTH TWICE DAILY Patient taking differently: Take 75 mg by mouth daily.   11/03/17  Yes Troy Sine, MD  GLUCOSAMINE-CHONDROITIN PO Take 1 tablet by mouth daily.    Yes [provider]  losartan (COZAAR) 100 MG tablet TAKE 1 TABLET BY MOUTH ONCE DAILY Patient taking differently: Take 100 mg by mouth daily.  10/01/17  Yes Troy Sine, MD  Multiple Vitamin (MULTIVITAMIN) tablet Take 1 tablet by mouth daily.   Yes [provider]  Saw Palmetto, Serenoa repens, (SAW PALMETTO PO) Take 1 tablet by mouth daily.   Yes [provider]  testosterone cypionate (DEPOTESTOSTERONE CYPIONATE) 200 MG/ML injection Inject 50 mg into the muscle once a week.  08/09/14  Yes [provider]  spironolactone (ALDACTONE) 25 MG tablet Take 0.5 tablets (12.5 mg total) by mouth daily. 01/27/18 04/27/18  Troy Sine, MD    Family History Family History  Problem Relation Age of Onset  . Heart disease Mother   . Heart disease Sister   . Heart disease Sister   . Hypertension Sister   . Colon cancer Neg Hx   . Stomach cancer Neg Hx     Social History Social History   Tobacco Use  . Smoking status: Former Smoker    Start date: 11/20/1976  . Smokeless tobacco: Never Used  . Tobacco comment: quit smoking 1986 ish  Substance Use Topics  . Alcohol use: Not Currently    Alcohol/week: 3.0 - 4.0 standard drinks    Types: 3 - 4 Standard drinks or equivalent per week  . Drug use: No     Allergies   Lisinopril   Review of Systems Review of Systems  Constitutional:       Per HPI, otherwise negative  HENT:       Per HPI, otherwise negative  Respiratory:       Per HPI, otherwise negative  Cardiovascular:       Per HPI, otherwise negative  Gastrointestinal: Negative for vomiting.  Endocrine:       Negative aside from HPI  Genitourinary:       Neg aside from HPI   Musculoskeletal:       Per HPI, otherwise negative  Skin: Negative.   Neurological: Negative for syncope.     Physical Exam Updated Vital Signs BP 128/68   Pulse 61    Resp 16   Ht 6' (1.829 m)   Wt 95.3 kg   SpO2 97%   BMI 28.48 kg/m   Physical Exam Vitals signs and nursing note reviewed.  Constitutional:      General: He is not in acute distress.    Appearance: He is well-developed.  HENT:     Head: Normocephalic and atraumatic.  Eyes:     Conjunctiva/sclera: Conjunctivae normal.  Cardiovascular:     Rate and Rhythm: Normal rate and regular rhythm.  Pulmonary:     Effort: Pulmonary effort is normal. No respiratory distress.  Breath sounds: No stridor.  Abdominal:     General: There is no distension.  Skin:    General: Skin is warm and dry.  Neurological:     Mental Status: He is alert and oriented to person, place, and time.      ED Treatments / Results  Labs (all labs ordered are listed, but only abnormal results are displayed) Labs Reviewed  COMPREHENSIVE METABOLIC PANEL - Abnormal; Notable for the following components:      Result Value   GFR calc non Af Amer 56 (*)    All other components within normal limits  BRAIN NATRIURETIC PEPTIDE - Abnormal; Notable for the following components:   B Natriuretic Peptide 250.0 (*)    All other components within normal limits  CBC WITH DIFFERENTIAL/PLATELET - Abnormal; Notable for the following components:   Abs Immature Granulocytes 0.08 (*)    All other components within normal limits  MAGNESIUM  TROPONIN I  PROTIME-INR  CBG MONITORING, ED    EKG EKG Interpretation  Date/Time:  Wednesday March 11 2018 10:42:24 EST Ventricular Rate:  59 PR Interval:    QRS Duration: 118 QT Interval:  397 QTC Calculation: 394 R Axis:   82 Text Interpretation:  Atrial fibrillation Incomplete left bundle branch block Abnormal ekg Confirmed by Carmin Muskrat [phone]) on 03/11/2018 10:47:55 AM Also confirmed by Carmin Muskrat ([id number]), editor Philomena Doheny [phone])  on 03/11/2018 11:40:13 AM   Radiology Dg Chest Portable 1 View  Result Date: 03/11/2018 CLINICAL DATA:  Chest tightness and  shortness of breath. EXAM: PORTABLE CHEST 1 VIEW COMPARISON:  Chest x-ray dated November 09, 2014. FINDINGS: Stable cardiomediastinal silhouette. Normal pulmonary vascularity. No focal consolidation, pleural effusion, or pneumothorax. No acute osseous abnormality. IMPRESSION: No active disease. Electronically Signed   By: Titus Dubin M.D.   On: 03/11/2018 11:37    Procedures Procedures (including critical care time)  Medications Ordered in ED Medications  aspirin chewable tablet 324 mg (324 mg Oral Given 03/11/18 1135)     Initial Impression / Assessment and Plan / ED Course  I have reviewed the triage vital signs and the nursing notes.  Pertinent labs & imaging results that were available during my care of the patient were reviewed by me and considered in my medical decision making (see chart for details).     1:57 PM Patient continues to have lightheadedness, heart rate has been variable, though persistently with atrial fibrillation. With some consideration of medication effects, but no initial evidence for ongoing ischemia, I discussed the patient's case with her cardiology colleagues, with whom he is familiar. Patient will be seen and evaluated by cardiology, appropriate for dispel per the recommendation.  Final Clinical Impressions(s) / ED Diagnoses  Near syncope Atypical chest pain   Carmin Muskrat, MD 03/11/18 1358

## 2018-03-11 NOTE — ED Notes (Signed)
ED Provider at bedside. 

## 2018-03-11 NOTE — ED Triage Notes (Signed)
PT arrives by POV c/o "tightness in chest", palpations in chest, and feels like "he was going to pass out this morning."  PT has hx of afib. Pt reports taking flecinide this morning at 0830; pt on eliquis daily for his afib. Pt a&o;  VSS at this time.

## 2018-03-11 NOTE — ED Notes (Signed)
Consulting Provider at bedside (in hallway room).

## 2018-03-11 NOTE — Progress Notes (Signed)
RT present for cardioversion of pt.  Pt placed on 4l [place]. SpO2 94%.

## 2018-03-11 NOTE — ED Notes (Signed)
Message sent to pharmacy to verify and send flecainide

## 2018-03-12 ENCOUNTER — Ambulatory Visit: Payer: Medicare HMO | Admitting: Cardiology

## 2018-03-12 ENCOUNTER — Telehealth: Payer: Self-pay | Admitting: Cardiovascular Disease

## 2018-03-12 NOTE — Telephone Encounter (Signed)
Spoke with patient around 10:00 am. Patient was in ED yesterday with Afib and had cardioversion. His Atenolol was d/c secondary to bradycardia. He was supposed to be taking Atenolol 25 mg 1/4 a tablet twice a day however he was taking 100 mg 1/4 twice a day. Since he got home from ED last night he has been having bad PVC's. He stated he thought he was back in Afib however he realized they were PVC's. They were at rest but would subside when he was up walking around. Discussed with Dr Claiborne Billings, scheduled appointment tomorrow with Janan Ridge PA. Advised patient Dr Claiborne Billings would prefer for patient not to take any Atenolol but if patient feels like he needs Atenolol 100 mg 1/4 tablet if heart rate above 80. Patient verbalized understanding.

## 2018-03-12 NOTE — Telephone Encounter (Signed)
New message    Pt c/o medication issue:  1. Name of Medication: atenolol (TENORMIN) 25 MG tablet    2. How are you currently taking this medication (dosage and times per day)? 1 pill 2x a day   3. Are you having a reaction (difficulty breathing--STAT)? no  4. What is your medication issue? Pt spouse stated that he has been having pvc and was advised to quit taking it. She wants to know if he should continue taking medication .

## 2018-03-13 ENCOUNTER — Ambulatory Visit: Payer: Medicare HMO | Admitting: Physician Assistant

## 2018-03-13 ENCOUNTER — Encounter: Payer: Self-pay | Admitting: Physician Assistant

## 2018-03-13 VITALS — BP 156/84 | HR 67 | Ht 72.0 in | Wt 206.0 lb

## 2018-03-13 DIAGNOSIS — I10 Essential (primary) hypertension: Secondary | ICD-10-CM | POA: Diagnosis not present

## 2018-03-13 DIAGNOSIS — I480 Paroxysmal atrial fibrillation: Secondary | ICD-10-CM | POA: Diagnosis not present

## 2018-03-13 DIAGNOSIS — G4733 Obstructive sleep apnea (adult) (pediatric): Secondary | ICD-10-CM | POA: Diagnosis not present

## 2018-03-13 MED ORDER — CARVEDILOL 3.125 MG PO TABS: 3.1250 mg | ORAL_TABLET | Freq: Two times a day (BID) | ORAL | 0 refills | Status: DC

## 2018-03-13 NOTE — Progress Notes (Signed)
Cardiology Office Note    Date:  03/15/2018   ID:  Mario [name], DOB 03/07/1940, MRN [id number]  PCP:  Dickie La, MD  Cardiologist:  Dr. Claiborne Billings  Chief Complaint  Patient presents with  . Follow-up    seen for Dr. Claiborne Billings    History of Present Illness:  Mario [name] is a 78 y.o. male with PMH of hypertension, OSA, PAF and PAC/PVCs.  He had a cardiac catheterization in 1995.  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  He was diagnosed with atrial fibrillation in August 2016, this was eventually cardioverted by December 2016 after he was placed on Eliquis.  He also has a history of loud snoring as well.  Myoview in August 2016 showed small defect of mild severity in the basal inferior wall which is likely related to diaphragmatic attenuation, EF 59%.  Echocardiogram showed EF 50 to 55%, possible mild posterior basal hypokinesis, mild AI and mild MR.  Due to recurrence of atrial fibrillation, he was placed on flecainide.  Patient was last seen in December 2019, at which time he was 100% compliant with CPAP therapy.  QTc interval was stable on the flecainide.    More recently, patient presented to the emergency room on 03/11/2018 with persistent A. fib-like symptom and near syncope.  He also complained of chest pain as well.  EKG showed atrial fibrillation with slow heart rate and pauses up to 2 seconds.  Cardiology service was consulted and he was seen by Dr. Debara Pickett.  It was felt he was likely experiencing symptomatic A. Fib.  EKG also showed bradycardia as well.  Atenolol was discontinued and flecainide was further increased to 100 mg twice daily.  He underwent cardioversion in the emergency room with restoration of normal sinus rhythm.  He presents today for cardiology office visit, he denies any further symptoms.  He does continue to have a lot of PACs and PVCs.  He says the symptom was the worst the day after he was released from ED.  It improved slightly over the past few days.  I  would recommend addition of a low-dose rate control agent to see if it will help with his symptoms without causing significant bradycardia.    Past Medical History:  Diagnosis Date  . Arthritis    neck  . Dysrhythmia    afib - cardioverted 01/2015  . History of kidney stones   . Hypertension   . Melanoma ([place]) 2000   back; stage 3, squamous 1 - basal- several  . Neuromuscular disorder ([place])   . Phlebitis   . Pneumonia   . Seasonal allergies   . Shortness of breath dyspnea    with exertion  . Tubular adenoma of colon 2013   colonoscpy in 5 years [name]    Past Surgical History:  Procedure Laterality Date  . 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 CARDIOVERSION N/A 12/01/2014   Procedure: CARDIOVERSION;  Surgeon: Troy Sine, MD;  Location: [facility];  Service: Cardiovascular;  Laterality: N/A;  . CATARACT EXTRACTION  2002, 2004   bilateral  . CYSTOSCOPY    . ELBOW SURGERY Left 1984   tendon repair left arm  . INCISION AND DRAINAGE OF WOUND Left 08/28/2016   Procedure: IRRIGATION AND DEBRIDEMENT WOUND;  Surgeon: Iran Planas, MD;  Location: [facility];  Service: Orthopedics;  Laterality: Left;  . LITHOTRIPSY    .  melanoma removal  2000   back  . METACARPAL OSTEOTOMY Left 08/28/2016   Procedure: 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.;  Surgeon: Iran Planas, MD;  Location: [facility];  Service: Orthopedics;  Laterality: Left;  [name] MICROLARYNGOSCOPY WITH LASER N/A 01/25/2015   Procedure: MICROLARYNGOSCOPY ;  Surgeon: Melida Quitter, MD;  Location: [facility];  Service: ENT;  Laterality: N/A;  Microlaryngoscopy with 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 with CO2 laser   . TENDON REPAIR Right 2012   right  . TONSILLECTOMY    . UPPER GI ENDOSCOPY      Current Medications: Outpatient Medications Prior to Visit  Medication Sig Dispense Refill  . amLODipine (NORVASC) 10 MG tablet Take 1 tablet (10 mg total) by mouth  daily. 90 tablet 3  . ELIQUIS 5 MG TABS tablet TAKE 1 TABLET BY MOUTH TWICE DAILY (Patient taking differently: Take 5 mg by mouth 2 (two) times daily. ) 180 tablet 1  . flecainide (TAMBOCOR) 100 MG tablet Take 1 tablet (100 mg total) by mouth 2 (two) times daily. 180 tablet 3  . GLUCOSAMINE-CHONDROITIN PO Take 1 tablet by mouth daily.     [name] losartan (COZAAR) 100 MG tablet TAKE 1 TABLET BY MOUTH ONCE DAILY (Patient taking differently: Take 100 mg by mouth daily. ) 90 tablet 1  . Multiple Vitamin (MULTIVITAMIN) tablet Take 1 tablet by mouth daily.    . Saw Palmetto, Serenoa repens, (SAW PALMETTO PO) Take 1 tablet by mouth daily.    [name] testosterone cypionate (DEPOTESTOSTERONE CYPIONATE) 200 MG/ML injection Inject 50 mg into the muscle once a week.     [name] atenolol (TENORMIN) 25 MG tablet Take 25 mg by mouth 2 (two) times daily.     [name] spironolactone (ALDACTONE) 25 MG tablet Take 0.5 tablets (12.5 mg total) by mouth daily. 45 tablet 3   Facility-Administered Medications Prior to Visit  Medication Dose Route Frequency Provider Last Rate Last Dose  . 0.9 %  sodium chloride infusion  500 mL Intravenous Once Doran Stabler, MD         Allergies:   Lisinopril   Social History   Socioeconomic History  . Marital status: Married    Spouse name: Not on file  . Number of children: 2  . Years of education: Not on file  . Highest education level: Not on file  Occupational History  . Occupation: retired  Scientific laboratory technician  . Financial resource strain: Not on file  . Food insecurity:    Worry: Not on file    Inability: Not on file  . Transportation needs:    Medical: Not on file    Non-medical: Not on file  Tobacco Use  . Smoking status: Former Smoker    Start date: 11/20/1976  . Smokeless tobacco: Never Used  . Tobacco comment: quit smoking 1986 ish  Substance and Sexual Activity  . Alcohol use: Not Currently    Alcohol/week: 3.0 - 4.0 standard drinks    Types: 3 - 4 Standard drinks or  equivalent per week  . Drug use: No  . Sexual activity: Not on file  Lifestyle  . Physical activity:    Days per week: Not on file    Minutes per session: Not on file  . Stress: Not on file  Relationships  . Social connections:    Talks on phone: Not on file    Gets together: Not on file    Attends religious service: Not on file  Active member of club or organization: Not on file    Attends meetings of clubs or organizations: Not on file    Relationship status: Not on file  Other Topics Concern  . Not on file  Social History Narrative  . Not on file     Family History:  The patient's family history includes Heart disease in his mother, sister, and sister; Hypertension in his sister.   ROS:   Please see the history of present illness.    ROS All other systems reviewed and are negative.   PHYSICAL EXAM:   VS:  BP (!) 156/84   Pulse 67   Ht 6' (1.829 m)   Wt 206 lb (93.4 kg)   BMI 27.94 kg/m    GEN: Well nourished, well developed, in no acute distress  HEENT: normal  Neck: no JVD, carotid bruits, or masses Cardiac: RRR; no murmurs, rubs, or gallops,no edema  Respiratory:  clear to auscultation bilaterally, normal work of breathing GI: soft, nontender, nondistended, + BS MS: no deformity or atrophy  Skin: warm and dry, no rash Neuro:  Alert and Oriented x 3, Strength and sensation are intact Psych: euthymic mood, full affect  Wt Readings from Last 3 Encounters:  03/13/18 206 lb (93.4 kg)  03/11/18 210 lb (95.3 kg)  01/27/18 211 lb 12.8 oz (96.1 kg)      Studies/Labs Reviewed:   EKG:  EKG is not ordered today.   Recent Labs: 06/13/2017: TSH 3.360 03/11/2018: ALT 28; B Natriuretic Peptide 250.0; BUN 20; Creatinine, Ser 1.24; Hemoglobin 15.5; Magnesium 1.8; Platelets 189; Potassium 4.2; Sodium 137   Lipid Panel    Component Value Date/Time   CHOL 155 06/13/2017 0951   TRIG 132 06/13/2017 0951   HDL 45 06/13/2017 0951   CHOLHDL 3.3 11/28/2015 0943   VLDL 24  11/28/2015 0943   LDLCALC 84 06/13/2017 0951    Additional studies/ records that were reviewed today include:   Echo 10/07/2014 LV EF: 50% -   55%  ------------------------------------------------------------------- Indications:      Atrial Fibrillation (I48.91).  ------------------------------------------------------------------- History:   Risk factors:  ETOH Use, Obstructive Sleep Apnea. Family history of coronary artery disease. Former tobacco use. Hypertension.  ------------------------------------------------------------------- Study Conclusions  - Left ventricle: Posterior basal hypokinesis. The cavity size was   normal. Wall thickness was increased in a pattern of mild LVH.   Systolic function was normal. The estimated ejection fraction was   in the range of 50% to 55%. - Aortic valve: There was mild regurgitation. - Mitral valve: There was mild regurgitation. - Left atrium: The atrium was moderately dilated. - Atrial septum: No defect or patent foramen ovale was identified.    ASSESSMENT:    1. PAF (paroxysmal atrial fibrillation) ([place])   2. Essential hypertension   3. OSA (obstructive sleep apnea)      PLAN:  In order of problems listed above:  1. PAF: Recently underwent DC cardioversion in the emergency room.  His beta-blocker was discontinued in the emergency room, flecainide was increased to 100 mg twice daily.  He has been compliant with Eliquis.  Since his discharge, he felt he has more PVC and PAC episodes, I recommended a low-dose 3.125 mg twice daily of carvedilol to see if this will control the PVCs without causing significant bradycardia.  2. Hypertension: Blood pressure mildly elevated today, atenolol 25 mg twice daily was recently stopped due to significant bradycardia.  I recommended restarting carvedilol 3.125 mg twice daily for suppression of  PVCs.    Medication Adjustments/Labs and Tests Ordered: Current medicines are reviewed at length  with the patient today.  Concerns regarding medicines are outlined above.  Medication changes, Labs and Tests ordered today are listed in the Patient Instructions below. Patient Instructions  Medication Instructions:  START Carvedilol 3.125 mg twice daily.  If you need a refill on your cardiac medications before your next appointment, please call your pharmacy.    Follow-Up: At [facility], you and your health needs are our priority.  As part of our continuing mission to provide you with exceptional heart care, we have created designated Provider Care Teams.  These Care Teams include your primary Cardiologist (physician) and Advanced Practice Providers (APPs -  Physician Assistants and Nurse Practitioners) who all work together to provide you with the care you need, when you need it. . Please schedule a follow up appointment with Almyra Deforest, PA in 2 weeks. [name] Keep your schedule appointment with Dr. Claiborne Billings in March.  Any Other Special Instructions Will Be Listed Below (If Applicable). None      Hilbert Corrigan, [place]  03/15/2018 11:54 PM    [facility] [address], [place], [place]  [address] Phone: [phone]; Fax: [phone]

## 2018-03-13 NOTE — Patient Instructions (Signed)
Medication Instructions:  START Carvedilol 3.125 mg twice daily.  If you need a refill on your cardiac medications before your next appointment, please call your pharmacy.    Follow-Up: At [facility], you and your health needs are our priority.  As part of our continuing mission to provide you with exceptional heart care, we have created designated Provider Care Teams.  These Care Teams include your primary Cardiologist (physician) and Advanced Practice Providers (APPs -  Physician Assistants and Nurse Practitioners) who all work together to provide you with the care you need, when you need it. . Please schedule a follow up appointment with Almyra Deforest, PA in 2 weeks. [name] Keep your schedule appointment with Dr. Claiborne Billings in March.  Any Other Special Instructions Will Be Listed Below (If Applicable). None

## 2018-03-15 ENCOUNTER — Encounter: Payer: Self-pay | Admitting: Physician Assistant

## 2018-03-17 ENCOUNTER — Ambulatory Visit: Payer: Medicare HMO | Admitting: Adult Health

## 2018-03-31 ENCOUNTER — Encounter: Payer: Self-pay | Admitting: Physician Assistant

## 2018-03-31 ENCOUNTER — Ambulatory Visit: Payer: Medicare HMO | Admitting: Physician Assistant

## 2018-03-31 VITALS — BP 160/76 | HR 65 | Ht 72.0 in | Wt 203.0 lb

## 2018-03-31 DIAGNOSIS — G4733 Obstructive sleep apnea (adult) (pediatric): Secondary | ICD-10-CM

## 2018-03-31 DIAGNOSIS — I493 Ventricular premature depolarization: Secondary | ICD-10-CM | POA: Diagnosis not present

## 2018-03-31 DIAGNOSIS — Z9989 Dependence on other enabling machines and devices: Secondary | ICD-10-CM

## 2018-03-31 DIAGNOSIS — I480 Paroxysmal atrial fibrillation: Secondary | ICD-10-CM

## 2018-03-31 DIAGNOSIS — I10 Essential (primary) hypertension: Secondary | ICD-10-CM | POA: Diagnosis not present

## 2018-03-31 MED ORDER — HYDRALAZINE HCL 25 MG PO TABS: 25.0000 mg | ORAL_TABLET | Freq: Two times a day (BID) | ORAL | 0 refills | Status: DC

## 2018-03-31 NOTE — Progress Notes (Signed)
Cardiology Office Note    Date:  04/02/2018   ID:  Mario [name], DOB 09-Feb-1941, MRN [id number]  PCP:  Dickie La, MD  Cardiologist:  Dr. Claiborne Billings  Chief Complaint  Patient presents with  . Follow-up    seen for Dr. Claiborne Billings.     History of Present Illness:  Mario [name] is a 78 y.o. male with PMH of hypertension, OSA, PAF and PAC/PVCs.  He had a cardiac catheterization in 1995.  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  He was diagnosed with atrial fibrillation in August 2016, this was eventually cardioverted by December 2016 after he was placed on Eliquis.  He also has a history of loud snoring as well.  Myoview in August 2016 showed small defect of mild severity in the basal inferior wall which is likely related to diaphragmatic attenuation, EF 59%.  Echocardiogram showed EF 50 to 55%, possible mild posterior basal hypokinesis, mild AI and mild MR.  Due to recurrence of atrial fibrillation, he was placed on flecainide.  Patient was last seen in December 2019, at which time he was 100% compliant with CPAP therapy.  QTc interval was stable on the flecainide.    Patient presented to the ED on 03/11/2018 with persistent A. fib-like symptom and near syncope.  He complained of chest discomfort as well.  EKG showed atrial fibrillation with slow heart rate and pauses up to 2 seconds.  He was seen by Dr. Debara Pickett who felt he likely experienced symptomatic A. fib.  EKG showed bradycardia as well.  Atenolol was discontinued and flecainide was further increased to 100 mg twice daily.  He underwent cardioversion in the emergency room with restoration of normal sinus rhythm.  I saw the patient on 03/13/2018, he continued to have a lot of PACs and PVCs, I reintroduced a low-dose carvedilol 3.125 mg twice daily to help control the PACs and PVCs.  Patient presents today for cardiology office visit.  He denies any chest pain or palpitation.  His PVC is completely gone at this point.  I am hesitant to  further increase carvedilol due to history of bradycardia.  His blood pressure recently however has been elevated in the 140s 160s range, I will add hydralazine 25 mg twice daily to his medical regimen.    Past Medical History:  Diagnosis Date  . Arthritis    neck  . Dysrhythmia    afib - cardioverted 01/2015  . History of kidney stones   . Hypertension   . Melanoma ([place]) 2000   back; stage 3, squamous 1 - basal- several  . Neuromuscular disorder ([place])   . Phlebitis   . Pneumonia   . Seasonal allergies   . Shortness of breath dyspnea    with exertion  . Tubular adenoma of colon 2013   colonoscpy in 5 years [name]    Past Surgical History:  Procedure Laterality Date  . 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 CARDIOVERSION N/A 12/01/2014   Procedure: CARDIOVERSION;  Surgeon: Troy Sine, MD;  Location: [facility];  Service: Cardiovascular;  Laterality: N/A;  . CATARACT EXTRACTION  2002, 2004   bilateral  . CYSTOSCOPY    . ELBOW SURGERY Left 1984   tendon repair left arm  . INCISION AND DRAINAGE OF WOUND Left 08/28/2016   Procedure: IRRIGATION AND DEBRIDEMENT WOUND;  Surgeon: Iran Planas, MD;  Location: [facility];  Service: Orthopedics;  Laterality: Left;  . LITHOTRIPSY    . melanoma removal  2000   back  . METACARPAL OSTEOTOMY Left 08/28/2016   Procedure: 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.;  Surgeon: Iran Planas, MD;  Location: [facility];  Service: Orthopedics;  Laterality: Left;  [name] MICROLARYNGOSCOPY WITH LASER N/A 01/25/2015   Procedure: MICROLARYNGOSCOPY ;  Surgeon: Melida Quitter, MD;  Location: [facility];  Service: ENT;  Laterality: N/A;  Microlaryngoscopy with 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 with CO2 laser   . TENDON REPAIR Right 2012   right  . TONSILLECTOMY    . UPPER GI ENDOSCOPY      Current Medications: Outpatient Medications Prior to Visit  Medication Sig Dispense Refill  . amLODipine  (NORVASC) 10 MG tablet Take 1 tablet (10 mg total) by mouth daily. 90 tablet 3  . carvedilol (COREG) 3.125 MG tablet Take 1 tablet (3.125 mg total) by mouth 2 (two) times daily. 180 tablet 0  . ELIQUIS 5 MG TABS tablet TAKE 1 TABLET BY MOUTH TWICE DAILY (Patient taking differently: Take 5 mg by mouth 2 (two) times daily. ) 180 tablet 1  . flecainide (TAMBOCOR) 100 MG tablet Take 1 tablet (100 mg total) by mouth 2 (two) times daily. 180 tablet 3  . GLUCOSAMINE-CHONDROITIN PO Take 1 tablet by mouth daily.     [name] losartan (COZAAR) 100 MG tablet TAKE 1 TABLET BY MOUTH ONCE DAILY (Patient taking differently: Take 100 mg by mouth daily. ) 90 tablet 1  . Multiple Vitamin (MULTIVITAMIN) tablet Take 1 tablet by mouth daily.    . Saw Palmetto, Serenoa repens, (SAW PALMETTO PO) Take 1 tablet by mouth daily.    [name] testosterone cypionate (DEPOTESTOSTERONE CYPIONATE) 200 MG/ML injection Inject 50 mg into the muscle once a week.      Facility-Administered Medications Prior to Visit  Medication Dose Route Frequency Provider Last Rate Last Dose  . 0.9 %  sodium chloride infusion  500 mL Intravenous Once Doran Stabler, MD         Allergies:   Lisinopril   Social History   Socioeconomic History  . Marital status: Married    Spouse name: Not on file  . Number of children: 2  . Years of education: Not on file  . Highest education level: Not on file  Occupational History  . Occupation: retired  Scientific laboratory technician  . Financial resource strain: Not on file  . Food insecurity:    Worry: Not on file    Inability: Not on file  . Transportation needs:    Medical: Not on file    Non-medical: Not on file  Tobacco Use  . Smoking status: Former Smoker    Start date: 11/20/1976  . Smokeless tobacco: Never Used  . Tobacco comment: quit smoking 1986 ish  Substance and Sexual Activity  . Alcohol use: Not Currently    Alcohol/week: 3.0 - 4.0 standard drinks    Types: 3 - 4 Standard drinks or equivalent per week    . Drug use: No  . Sexual activity: Not on file  Lifestyle  . Physical activity:    Days per week: Not on file    Minutes per session: Not on file  . Stress: Not on file  Relationships  . Social connections:    Talks on phone: Not on file    Gets together: Not on file    Attends religious service: Not on file    Active member of  club or organization: Not on file    Attends meetings of clubs or organizations: Not on file    Relationship status: Not on file  Other Topics Concern  . Not on file  Social History Narrative  . Not on file     Family History:  The patient's family history includes Heart disease in his mother, sister, and sister; Hypertension in his sister.   ROS:   Please see the history of present illness.    ROS All other systems reviewed and are negative.   PHYSICAL EXAM:   VS:  BP (!) 160/76   Pulse 65   Ht 6' (1.829 m)   Wt 203 lb (92.1 kg)   BMI 27.53 kg/m    GEN: Well nourished, well developed, in no acute distress  HEENT: normal  Neck: no JVD, carotid bruits, or masses Cardiac: RRR; no murmurs, rubs, or gallops,no edema  Respiratory:  clear to auscultation bilaterally, normal work of breathing GI: soft, nontender, nondistended, + BS MS: no deformity or atrophy  Skin: warm and dry, no rash Neuro:  Alert and Oriented x 3, Strength and sensation are intact Psych: euthymic mood, full affect  Wt Readings from Last 3 Encounters:  03/31/18 203 lb (92.1 kg)  03/13/18 206 lb (93.4 kg)  03/11/18 210 lb (95.3 kg)      Studies/Labs Reviewed:   EKG:  EKG is ordered today.  The ekg ordered today demonstrates normal sinus rhythm nonspecific T wave changes  Recent Labs: 06/13/2017: TSH 3.360 03/11/2018: ALT 28; B Natriuretic Peptide 250.0; BUN 20; Creatinine, Ser 1.24; Hemoglobin 15.5; Magnesium 1.8; Platelets 189; Potassium 4.2; Sodium 137   Lipid Panel    Component Value Date/Time   CHOL 155 06/13/2017 0951   TRIG 132 06/13/2017 0951   HDL 45  06/13/2017 0951   CHOLHDL 3.3 11/28/2015 0943   VLDL 24 11/28/2015 0943   LDLCALC 84 06/13/2017 0951    Additional studies/ records that were reviewed today include:   Echo 10/07/2014 LV EF: 50% - 55% Study Conclusions  - Left ventricle: Posterior basal hypokinesis. The cavity size was normal. Wall thickness was increased in a pattern of mild LVH. Systolic function was normal. The estimated ejection fraction was in the range of 50% to 55%. - Aortic valve: There was mild regurgitation. - Mitral valve: There was mild regurgitation. - Left atrium: The atrium was moderately dilated. - Atrial septum: No defect or patent foramen ovale was identified.   ASSESSMENT:    1. PAF (paroxysmal atrial fibrillation) ([place])   2. Essential hypertension   3. PVC (premature ventricular contraction)   4. OSA on CPAP      PLAN:  In order of problems listed above:  1. Paroxysmal atrial fibrillation: Controlled on flecainide.  Previous atenolol was discontinued, however I reintroduced a low-dose carvedilol for his frequent PVCs.   2. PVCs: Sent a significant reduction in the amount of palpitation after reintroduction of low-dose carvedilol  3. Hypertension: Blood pressure elevated, I am hesitant to further increase carvedilol dosage given the recent bradycardia.  We will add hydralazine 25 mg twice daily  4. Obstructive sleep apnea: Continue CPAP    Medication Adjustments/Labs and Tests Ordered: Current medicines are reviewed at length with the patient today.  Concerns regarding medicines are outlined above.  Medication changes, Labs and Tests ordered today are listed in the Patient Instructions below. Patient Instructions  Medication Instructions:   START HYDRALAZINE 25 MG 2 TIMES A DAY  If you need a  refill on your cardiac medications before your next appointment, please call your pharmacy.   Lab work: NONE  If you have labs (blood work) drawn today and your tests are  completely normal, you will receive your results only by: [name] MyChart Message (if you have MyChart) OR . A paper copy in the mail If you have any lab test that is abnormal or we need to change your treatment, we will call you to review the results.  Testing/Procedures: NONE  Follow-Up: Your physician recommends that you KEEP your scheduled follow-up appointment with Dr. Claiborne Billings          Signed, Almyra Deforest, [place]  04/02/2018 11:41 PM    [address], [place], [place]  [address] Phone: [phone]; Fax: [phone]

## 2018-03-31 NOTE — Patient Instructions (Signed)
Medication Instructions:   START HYDRALAZINE 25 MG 2 TIMES A DAY  If you need a refill on your cardiac medications before your next appointment, please call your pharmacy.   Lab work: NONE  If you have labs (blood work) drawn today and your tests are completely normal, you will receive your results only by: [name] MyChart Message (if you have MyChart) OR . A paper copy in the mail If you have any lab test that is abnormal or we need to change your treatment, we will call you to review the results.  Testing/Procedures: NONE  Follow-Up: Your physician recommends that you KEEP your scheduled follow-up appointment with Dr. Claiborne Billings

## 2018-04-02 ENCOUNTER — Encounter: Payer: Self-pay | Admitting: Physician Assistant

## 2018-04-03 DIAGNOSIS — G4733 Obstructive sleep apnea (adult) (pediatric): Secondary | ICD-10-CM | POA: Diagnosis not present

## 2018-04-09 DIAGNOSIS — M7541 Impingement syndrome of right shoulder: Secondary | ICD-10-CM | POA: Diagnosis not present

## 2018-04-30 ENCOUNTER — Other Ambulatory Visit: Payer: Self-pay

## 2018-04-30 ENCOUNTER — Ambulatory Visit (HOSPITAL_COMMUNITY): Payer: Medicare HMO | Attending: Cardiology

## 2018-04-30 DIAGNOSIS — I10 Essential (primary) hypertension: Secondary | ICD-10-CM

## 2018-04-30 DIAGNOSIS — I480 Paroxysmal atrial fibrillation: Secondary | ICD-10-CM

## 2018-05-04 ENCOUNTER — Ambulatory Visit: Payer: Medicare HMO | Admitting: Family

## 2018-05-04 ENCOUNTER — Telehealth: Payer: Self-pay

## 2018-05-04 NOTE — Telephone Encounter (Signed)
   Cardiac Questionnaire:    Since your last visit or hospitalization:    1. Have you been having new or worsening chest pain? NO   2. Have you been having new or worsening shortness of breath? NO 3. Have you been having new or worsening leg swelling, wt gain, or increase in abdominal girth (pants fitting more tightly)? NO   4. Have you had any passing out spells? NO      Primary Cardiologist:  Shelva Majestic, MD   Patient contacted.  History reviewed.  No symptoms to suggest any unstable cardiac conditions.  Based on discussion, with current pandemic situation, we will be postponing this appointment for [name].  If symptoms change, he has been instructed to contact our office.   Routing to C19 CANCEL pool for tracking (P CV DIV CV19 CANCEL) and assigning priority (1 = 4-6 wks, 2 = 6-12 wks, 3 = >12 wks).  Priority 3  Ena Dawley, LPN  3/33/8329 1:91 PM         .

## 2018-05-05 ENCOUNTER — Ambulatory Visit: Payer: Medicare HMO | Admitting: Cardiovascular Disease

## 2018-05-11 ENCOUNTER — Other Ambulatory Visit: Payer: Self-pay

## 2018-05-12 ENCOUNTER — Other Ambulatory Visit: Payer: Self-pay | Admitting: Cardiovascular Disease

## 2018-05-12 ENCOUNTER — Encounter: Payer: Self-pay | Admitting: Cardiovascular Disease

## 2018-05-12 ENCOUNTER — Telehealth (INDEPENDENT_AMBULATORY_CARE_PROVIDER_SITE_OTHER): Payer: Medicare HMO | Admitting: Cardiovascular Disease

## 2018-05-12 DIAGNOSIS — Z79899 Other long term (current) drug therapy: Secondary | ICD-10-CM

## 2018-05-12 DIAGNOSIS — I350 Nonrheumatic aortic (valve) stenosis: Secondary | ICD-10-CM

## 2018-05-12 DIAGNOSIS — G4733 Obstructive sleep apnea (adult) (pediatric): Secondary | ICD-10-CM | POA: Diagnosis not present

## 2018-05-12 DIAGNOSIS — I480 Paroxysmal atrial fibrillation: Secondary | ICD-10-CM | POA: Diagnosis not present

## 2018-05-12 DIAGNOSIS — M25472 Effusion, left ankle: Secondary | ICD-10-CM

## 2018-05-12 DIAGNOSIS — I10 Essential (primary) hypertension: Secondary | ICD-10-CM | POA: Diagnosis not present

## 2018-05-12 DIAGNOSIS — Z7901 Long term (current) use of anticoagulants: Secondary | ICD-10-CM

## 2018-05-12 DIAGNOSIS — Z9989 Dependence on other enabling machines and devices: Secondary | ICD-10-CM

## 2018-05-12 DIAGNOSIS — M25471 Effusion, right ankle: Secondary | ICD-10-CM

## 2018-05-12 NOTE — Progress Notes (Signed)
Virtual Visit via Telephone Note     Evaluation Performed:  Follow-up visit  This visit type was conducted due to national recommendations for restrictions regarding the COVID-19 Pandemic (e.g. social distancing).  This format is felt to be most appropriate for this patient at this time.  All issues noted in this document were discussed and addressed.  No physical exam was performed (except for noted visual exam findings with Video Visits).  Please refer to the patient's chart (MyChart message for video visits and phone note for telephone visits) for the patient's consent to telehealth for [facility]. Verbal consent was obtained with the patient to proceed with this visit and bill insurance.  Date:  05/12/2018   ID:  Mario [name], DOB [birth year], MRN [id number]  Patient Location: [place] [address]   Provider location:   [place]  PCP:  Dickie La, MD  Cardiologist:  Shelva Majestic, MD Electrophysiologist:  None   Chief Complaint:  F/U AF  History of Present Illness:    Mario [name] is a 78 y.o. male who presents via audio/video conferencing for a telehealth visit today.    The patient does not have symptoms concerning for COVID-19 infection (fever, chills, cough, or new SHORTNESS OF BREATH).   Mr. Mario [name] has a history of hypertension, obstructive sleep apnea, paroxysmal atrial fibrillation, as well as PACs and PVCs. He had undergone a remote cardiac catheterization over 25 years ago.  He is retired Designer, industrial/product and has worked out fairly hard over his entire life.  I initially saw him in 2016 when he was found to be in atrial fibrillation of which he was unaware.  An echo Doppler study at that time showed an EF of 50 to 55% with possible mild posterior basal hypokinesis, mild AI and mild AR.  A Myoview study in 2016 was low risk and showed a minimal basal inferior defect most likely contributed by diaphragmatic attenuation.  He has been diagnosed with  obstructive sleep apnea and has been 100s and compliant with therapy.  This is made a significant difference.  He had developed recurrent atrial fibrillation and was started on flecainide.  I last saw him in December 2019 at which time he was maintaining sinus rhythm, was 1% compliant with CPAP therapy.  QTc interval was stable.  On March 11, 2018 He developed an episode of lightheadedness and recurrent atrial fibrillation and was evaluated in the emergency room.  His heart rate was not fast and he was found to have up to 2-second pause.  Atenolol was discontinued and flecainide was further increased to 100 mg twice a day.  He underwent cardioversion in the emergency room with restoration of sinus rhythm.  He was subsequently seen on March 13, 2018 by Mammie Russian, PA-C and was noted to have frequent PACs and PVCs.  He was started back on low-dose beta-blocker therapy with carvedilol 3.125 mg twice a day.  When seen several weeks later on March 31, 2018 heart rate was stable.  ECG showed sinus rhythm and QTc interval was normal at 393 ms.  However hydralazine was added to his regimen for mild blood pressure elevation.  Since he was last seen by Almyra Deforest, PAC, he has felt well.  He has continued to be very active working out at [employer] and now that the gym is closed due to the curve at 19 pandemic, he has been active building a deck at his house.  He lives outside  [place] on a 12 acre plot of land.  He is unaware of any recurrent atrial fibrillation.  He denies any anginal type chest pain symptoms.  He continues to use CPAP with 1% compliance.  When asked how his blood pressures have been running he states typically his blood pressure runs in the 1 20-1 30 range with diastolics in the 79G to 92J.  He underwent a repeat echo Doppler study on April 30, 2018.  This shows normal systolic function with an EF of 60 to 65%.  There is mild to moderate biatrial enlargement.  There is evidence for mild aortic stenosis  with a mean gradient of 10 and peak gradient of 18.  He had mild aortic root dilatation at 41 mm.  He presents for this telemedicine assessment.   Prior CV studies:   The following studies were reviewed today:  Echo 10/07/2014 LV EF: 50% - 55% Study Conclusions  - Left ventricle: Posterior basal hypokinesis. The cavity size was normal. Wall thickness was increased in a pattern of mild LVH. Systolic function was normal. The estimated ejection fraction was in the range of 50% to 55%. - Aortic valve: There was mild regurgitation. - Mitral valve: There was mild regurgitation. - Left atrium: The atrium was moderately dilated. - Atrial septum: No defect or patent foramen ovale was identified.   ECHO 04/30/2018 IMPRESSIONS  1. The left ventricle has normal systolic function with an ejection fraction of 60-65%. The cavity size was normal. There is mild concentric left ventricular hypertrophy. Left ventricular diastolic Doppler parameters are indeterminate.  2. The right ventricle has normal systolic function. The cavity was normal. There is no increase in right ventricular wall thickness.  3. Left atrial size was mild-moderately dilated.  4. The aortic valve is tricuspid Mild thickening of the aortic valve Mild calcification of the aortic valve. Aortic valve regurgitation is mild to moderate by color flow Doppler. mild stenosis of the aortic valve.  5. There is mild to moderate dilatation of the ascending aorta measuring 41 mm.  SUMMARY   Normal LV EF. Mildly thickened, mildly calcified aortic valve with mild-moderate central AR and very mild stenosis.  FINDINGS  Left Ventricle: The left ventricle has normal systolic function, with an ejection fraction of 60-65%. The cavity size was normal. There is mild concentric left ventricular hypertrophy. Left ventricular diastolic Doppler parameters are indeterminate. Right Ventricle: The right ventricle has normal systolic function. The  cavity was normal. There is no increase in right ventricular wall thickness. Left Atrium: left atrial size was mild-moderately dilated Right Atrium: right atrial size was mild-moderately dilated. Interatrial Septum: No atrial level shunt detected by color flow Doppler. Pericardium: There is no evidence of pericardial effusion. Mitral Valve: The mitral valve is normal in structure. Mitral valve regurgitation is trivial by color flow Doppler. Tricuspid Valve: The tricuspid valve is normal in structure. Tricuspid valve regurgitation is mild by color flow Doppler. Aortic Valve: The aortic valve is tricuspid Mild thickening of the aortic valve Mild calcification of the aortic valve. Aortic valve regurgitation is mild to moderate by color flow Doppler. There is mild stenosis of the aortic valve, with a calculated  valve area of 2.11 cm. Pulmonic Valve: The pulmonic valve was grossly normal. Pulmonic valve regurgitation is trivial by color flow Doppler. No evidence of pulmonic stenosis. Aorta: There is mild to moderate dilatation of the ascending aorta measuring 41 mm. Venous: The inferior vena cava is normal in size with greater than 50% respiratory variability.  LEFT VENTRICLE PLAX 2D LVIDd:         4.80 cm  Diastology LVIDs:         2.80 cm  LV e' lateral:   10.40 cm/s LV PW:         1.30 cm  LV E/e' lateral: 9.3 LV IVS:        1.40 cm  LV e' medial:    9.25 cm/s LVOT diam:     1.90 cm  LV E/e' medial:  10.4 LV SV:         78 ml LV SV Index:   35.80 LVOT Area:     2.84 cm  RIGHT VENTRICLE RV Basal diam:  3.40 cm RV S prime:     16.80 cm/s TAPSE (M-mode): 1.9 cm  LEFT ATRIUM             Index       RIGHT ATRIUM           Index LA diam:        5.00 cm 2.33 cm/m  RA Area:     20.60 cm LA Vol (A2C):   82.4 ml 38.43 ml/m RA Volume:   65.70 ml  30.64 ml/m LA Vol (A4C):   77.5 ml 36.15 ml/m LA Biplane Vol: 81.6 ml 38.06 ml/m  AORTIC VALVE AV Area (Vmax):    1.86 cm AV Area  (Vmean):   2.05 cm AV Area (VTI):     2.11 cm AV Vmax:           216.25 cm/s AV Vmean:          147.500 cm/s AV VTI:            0.430 m AV Peak Grad:      18.7 mmHg AV Mean Grad:      10.2 mmHg LVOT Vmax:         142.00 cm/s LVOT Vmean:        106.500 cm/s LVOT VTI:          0.321 m LVOT/AV VTI ratio: 0.75 AR PHT:            600 msec   AORTA Ao Root diam: 3.70 cm Ao Asc diam:  4.10 cm  MITRAL VALVE              TRICUSPID VALVE MV Area (PHT):            TR Peak grad:   24.9 mmHg MV PHT:                   TR Vmax:        271.00 cm/s MV Decel Time: 201 msec MV E velocity: 96.30 cm/s SHUNTS MV A velocity: 41.00 cm/s Systemic VTI:  0.32 m MV E/A ratio:  2.35       Systemic Diam: 1.90 cm   Past Medical History:  Diagnosis Date  . Arthritis    neck  . Dysrhythmia    afib - cardioverted 01/2015  . History of kidney stones   . Hypertension   . Melanoma ([place]) 2000   back; stage 3, squamous 1 - basal- several  . Neuromuscular disorder ([place])   . Phlebitis   . Pneumonia   . Seasonal allergies   . Shortness of breath dyspnea    with exertion  . Tubular adenoma of colon 2013   colonoscpy in 5 years [name]   Past Surgical History:  Procedure Laterality Date  . 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 CARDIOVERSION N/A 12/01/2014   Procedure: CARDIOVERSION;  Surgeon: Troy Sine, MD;  Location: [facility];  Service: Cardiovascular;  Laterality: N/A;  . CATARACT EXTRACTION  2002, 2004   bilateral  . CYSTOSCOPY    . ELBOW SURGERY Left 1984   tendon repair left arm  . INCISION AND DRAINAGE OF WOUND Left 08/28/2016   Procedure: IRRIGATION AND DEBRIDEMENT WOUND;  Surgeon: Iran Planas, MD;  Location: [facility];  Service: Orthopedics;  Laterality: Left;  . LITHOTRIPSY    . melanoma removal  2000   back  . METACARPAL OSTEOTOMY Left 08/28/2016   Procedure: 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.;  Surgeon: Iran Planas, MD;  Location: [facility];  Service: Orthopedics;  Laterality: Left;  [name] MICROLARYNGOSCOPY WITH LASER N/A 01/25/2015   Procedure: MICROLARYNGOSCOPY ;  Surgeon: Melida Quitter, MD;  Location: [place];  Service: ENT;  Laterality: N/A;  Microlaryngoscopy with 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 with CO2 laser   . TENDON REPAIR Right 2012   right  . TONSILLECTOMY    . UPPER GI ENDOSCOPY       Current Meds  Medication Sig  . amLODipine (NORVASC) 10 MG tablet Take 1 tablet (10 mg total) by mouth daily.  . carvedilol (COREG) 3.125 MG tablet Take 1 tablet (3.125 mg total) by mouth 2 (two) times daily.  [name] ELIQUIS 5 MG TABS tablet TAKE 1 TABLET BY MOUTH TWICE DAILY (Patient taking differently: Take 5 mg by mouth 2 (two) times daily. )  . flecainide (TAMBOCOR) 100 MG tablet Take 1 tablet (100 mg total) by mouth 2 (two) times daily.  [name] GLUCOSAMINE-CHONDROITIN PO Take 1 tablet by mouth daily.   . hydrALAZINE (APRESOLINE) 25 MG tablet Take 1 tablet (25 mg total) by mouth 2 (two) times daily.  [name] losartan (COZAAR) 100 MG tablet TAKE 1 TABLET BY MOUTH ONCE DAILY (Patient taking differently: Take 100 mg by mouth daily. )  . Multiple Vitamin (MULTIVITAMIN) tablet Take 1 tablet by mouth daily.  . Saw Palmetto, Serenoa repens, (SAW PALMETTO PO) Take 1 tablet by mouth daily.  [name] testosterone cypionate (DEPOTESTOSTERONE CYPIONATE) 200 MG/ML injection Inject 50 mg into the muscle once a week.    Current Facility-Administered Medications for the 05/12/18 encounter (Telemedicine) with Troy Sine, MD  Medication  . 0.9 %  sodium chloride infusion     Allergies:   Lisinopril   Social History   Tobacco Use  . Smoking status: Former Smoker    Start date: 11/20/1976  . Smokeless tobacco: Never Used  . Tobacco comment: quit smoking 1986 ish  Substance Use Topics  . Alcohol use: Not Currently    Alcohol/week: 3.0 - 4.0 standard drinks    Types: 3 - 4 Standard drinks or equivalent per week  . Drug use:  No     Family Hx: The patient's family history includes Heart disease in his mother, sister, and sister; Hypertension in his sister. There is no history of Colon cancer or Stomach cancer.  ROS:   Please see the history of present illness.    Positive for PAF, obstructive sleep apnea on CPAP, recent ear infection in January All other systems reviewed and are negative.   Labs/Other Tests and Data Reviewed:    Recent Labs: 06/13/2017: TSH 3.360 03/11/2018: ALT 28; B Natriuretic Peptide 250.0; BUN 20; Creatinine, Ser 1.24; Hemoglobin 15.5; Magnesium 1.8; Platelets 189;  Potassium 4.2; Sodium 137   Recent Lipid Panel Lab Results  Component Value Date/Time   CHOL 155 06/13/2017 09:51 AM   TRIG 132 06/13/2017 09:51 AM   HDL 45 06/13/2017 09:51 AM   CHOLHDL 3.3 11/28/2015 09:43 AM   LDLCALC 84 06/13/2017 09:51 AM    Wt Readings from Last 3 Encounters:  05/12/18 205 lb (93 kg)  03/31/18 203 lb (92.1 kg)  03/13/18 206 lb (93.4 kg)     Exam:    Vital Signs:  BP (!) 144/78   Pulse 64   Ht 6' (1.829 m)   Wt 205 lb (93 kg)   BMI 27.80 kg/m    His blood pressure at home typically runs in the 1 76-2 30 systolically.  He had not taken his blood pressure medications this morning when the above blood pressure was noted.  He states since his cardioversion his pulse typically has been in the 60s.  He is unaware of any palpitations.  He continues to be a well-nourished active muscular gentleman in no acute distress. Respiratory rate is stable.  He is not short of breath.  He denies any wheezing.  He denies abdominal pain. He states at times he does note some ankle swelling.  This has been more prominent recently since he has been working hard on his feet most of the day constructing a deck at his house.  He denies any abnormality regarding neurologic system.  ASSESSMENT & PLAN:    1.  PAF: The patient is currently on Eliquis for anticoagulation.  I reviewed his recent emergency room  evaluation.  He continues to be on flecainide 100 mg twice a day and repeat ECG after his dose increase has continued to show normal QTc interval.  He is tolerating low-dose carvedilol 3.125 mg twice a day.  Resting pulse is in the 60s.  I advised that if he does note periods of increasing ectopy or palpitations he can take an extra carvedilol.  2: Essential hypertension: He is now on carvedilol 3.125 mg twice a day, hydralazine 25 mg twice a day, losartan 100 mg and amlodipine 10 mg.  Blood pressure at home typically is 1 20-1 30 range with diastolics in the 83T.  He will continue current regimen and continue to monitor his blood pressure.  We discussed sodium restriction.  3.  Anticoagulation: Currently on Eliquis.  No signs of bleeding.  4: Obstructive sleep apnea: He continues to have 100% compliance.  He cannot sleep without it.  He denies breakthrough snoring.  His sleep is restorative.  He denies daytime sleepiness.  5: Mild aortic stenosis: Mean gradient 10 peak gradient 18 on recent echo Doppler study of April 30, 2018.  COVID-19 Education: The signs and symptoms of COVID-19 were discussed with the patient and how to seek care for testing (follow up with PCP or arrange E-visit).  The importance of social distancing was discussed today.  Patient Risk:   After full review of this patients clinical status, I feel that they are at least moderate risk at this time.  Time:   Today, I have spent 24 minutes with the patient with telehealth technology .     Medication Adjustments/Labs and Tests Ordered: Current medicines are reviewed at length with the patient today.  Concerns regarding medicines are outlined above.  Tests Ordered: No orders of the defined types were placed in this encounter.  Medication Changes: No orders of the defined types were placed in this encounter.   Disposition: 4  months or sooner problems arise  Signed, Shelva Majestic, MD  05/12/2018 9:00 AM    [place]

## 2018-05-12 NOTE — Patient Instructions (Signed)
Medication Instructions:  The current medical regimen is effective;  continue present plan and medications.  If you need a refill on your cardiac medications before your next appointment, please call your pharmacy.   Follow-Up: At [facility], you and your health needs are our priority.  As part of our continuing mission to provide you with exceptional heart care, we have created designated Provider Care Teams.  These Care Teams include your primary Cardiologist (physician) and Advanced Practice Providers (APPs -  Physician Assistants and Nurse Practitioners) who all work together to provide you with the care you need, when you need it. You will need a follow up appointment in 4 months.  Please call our office 2 months in advance to schedule this appointment.  You may see Shelva Majestic, MD or one of the following Advanced Practice Providers on your designated Care Team: [place], [place] . Fabian Sharp, PA-C

## 2018-05-22 ENCOUNTER — Other Ambulatory Visit: Payer: Self-pay | Admitting: Cardiovascular Disease

## 2018-05-22 NOTE — Telephone Encounter (Signed)
Age 78 Scr 1.24 on 03/11/2018 93 Kg Rx for Eliquis 5mg  BID sent to pharmacy

## 2018-06-06 ENCOUNTER — Other Ambulatory Visit: Payer: Self-pay | Admitting: Physician Assistant

## 2018-06-08 NOTE — Telephone Encounter (Signed)
Carvedilol 3.135 mg refilled.

## 2018-06-29 ENCOUNTER — Other Ambulatory Visit: Payer: Self-pay | Admitting: Physician Assistant

## 2018-07-07 DIAGNOSIS — L111 Transient acantholytic dermatosis [Grover]: Secondary | ICD-10-CM | POA: Diagnosis not present

## 2018-07-07 DIAGNOSIS — Z85828 Personal history of other malignant neoplasm of skin: Secondary | ICD-10-CM | POA: Diagnosis not present

## 2018-07-07 DIAGNOSIS — L821 Other seborrheic keratosis: Secondary | ICD-10-CM | POA: Diagnosis not present

## 2018-07-07 DIAGNOSIS — D1801 Hemangioma of skin and subcutaneous tissue: Secondary | ICD-10-CM | POA: Diagnosis not present

## 2018-07-07 DIAGNOSIS — C44519 Basal cell carcinoma of skin of other part of trunk: Secondary | ICD-10-CM | POA: Diagnosis not present

## 2018-07-07 DIAGNOSIS — Z85820 Personal history of malignant melanoma of skin: Secondary | ICD-10-CM | POA: Diagnosis not present

## 2018-07-07 DIAGNOSIS — L720 Epidermal cyst: Secondary | ICD-10-CM | POA: Diagnosis not present

## 2018-07-07 DIAGNOSIS — D485 Neoplasm of uncertain behavior of skin: Secondary | ICD-10-CM | POA: Diagnosis not present

## 2018-07-07 DIAGNOSIS — C44619 Basal cell carcinoma of skin of left upper limb, including shoulder: Secondary | ICD-10-CM | POA: Diagnosis not present

## 2018-07-07 DIAGNOSIS — L570 Actinic keratosis: Secondary | ICD-10-CM | POA: Diagnosis not present

## 2018-07-07 DIAGNOSIS — C44319 Basal cell carcinoma of skin of other parts of face: Secondary | ICD-10-CM | POA: Diagnosis not present

## 2018-09-01 DIAGNOSIS — L111 Transient acantholytic dermatosis [Grover]: Secondary | ICD-10-CM | POA: Diagnosis not present

## 2018-09-01 DIAGNOSIS — D485 Neoplasm of uncertain behavior of skin: Secondary | ICD-10-CM | POA: Diagnosis not present

## 2018-09-01 DIAGNOSIS — L820 Inflamed seborrheic keratosis: Secondary | ICD-10-CM | POA: Diagnosis not present

## 2018-09-01 DIAGNOSIS — Z85820 Personal history of malignant melanoma of skin: Secondary | ICD-10-CM | POA: Diagnosis not present

## 2018-09-01 DIAGNOSIS — Z85828 Personal history of other malignant neoplasm of skin: Secondary | ICD-10-CM | POA: Diagnosis not present

## 2018-09-01 DIAGNOSIS — C44629 Squamous cell carcinoma of skin of left upper limb, including shoulder: Secondary | ICD-10-CM | POA: Diagnosis not present

## 2018-09-04 ENCOUNTER — Other Ambulatory Visit: Payer: Self-pay | Admitting: Cardiovascular Disease

## 2018-09-10 ENCOUNTER — Telehealth: Payer: Self-pay | Admitting: Cardiovascular Disease

## 2018-09-10 NOTE — Telephone Encounter (Signed)
  Wife is calling to request a prescription for the patient's cpap machine so that they can order one online. They can get it cheaper ordering online through [facility] but they need the prescription.

## 2018-09-14 ENCOUNTER — Ambulatory Visit (INDEPENDENT_AMBULATORY_CARE_PROVIDER_SITE_OTHER): Payer: Medicare HMO | Admitting: Family

## 2018-09-14 ENCOUNTER — Encounter: Payer: Self-pay | Admitting: Family

## 2018-09-14 ENCOUNTER — Other Ambulatory Visit: Payer: Self-pay

## 2018-09-14 ENCOUNTER — Encounter

## 2018-09-14 VITALS — BP 142/82 | HR 69 | Temp 98.0°F | Ht 72.0 in | Wt 203.0 lb

## 2018-09-14 DIAGNOSIS — I10 Essential (primary) hypertension: Secondary | ICD-10-CM

## 2018-09-14 DIAGNOSIS — I480 Paroxysmal atrial fibrillation: Secondary | ICD-10-CM

## 2018-09-14 DIAGNOSIS — C439 Malignant melanoma of skin, unspecified: Secondary | ICD-10-CM

## 2018-09-14 DIAGNOSIS — L03032 Cellulitis of left toe: Secondary | ICD-10-CM

## 2018-09-14 DIAGNOSIS — G4733 Obstructive sleep apnea (adult) (pediatric): Secondary | ICD-10-CM | POA: Diagnosis not present

## 2018-09-14 DIAGNOSIS — E291 Testicular hypofunction: Secondary | ICD-10-CM | POA: Diagnosis not present

## 2018-09-14 DIAGNOSIS — Z9989 Dependence on other enabling machines and devices: Secondary | ICD-10-CM | POA: Diagnosis not present

## 2018-09-14 MED ORDER — DOXYCYCLINE HYCLATE 100 MG PO TABS: 100.0000 mg | ORAL_TABLET | Freq: Two times a day (BID) | ORAL | 0 refills | Status: DC

## 2018-09-14 NOTE — Progress Notes (Signed)
Mario [name] is a 78 y.o. male with the following history as recorded in EpicCare:  Patient Active Problem List   Diagnosis Date Noted  . Atypical chest pain   . Near syncope   . Swelling of both ankles 07/02/2015  . 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 OSA on CPAP 01/12/2015  . Anticoagulation adequate 11/03/2014  . Atrial fibrillation ([place]) 09/29/2014  . History of colonic polyps 09/20/2014  . 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 Stricture and stenosis of esophagus 06/20/2008  . 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 HYPERTENSION, BENIGN 05/06/2006  . ELEVATED PROSTATE SPECIFIC ANTIGEN 05/06/2006  . Melanoma ([place]) 04/10/2006  . CATARACT 04/10/2006  . Diverticulitis of colon 04/10/2006  . NEPHROLITHIASIS 04/10/2006    Current Outpatient Medications  Medication Sig Dispense Refill  . amLODipine (NORVASC) 10 MG tablet Take 1 tablet by mouth once daily 90 tablet 1  . carvedilol (COREG) 3.125 MG tablet Take 1 tablet by mouth twice daily 180 tablet 1  . ELIQUIS 5 MG TABS tablet Take 1 tablet by mouth twice daily 180 tablet 1  . flecainide (TAMBOCOR) 100 MG tablet Take 1 tablet (100 mg total) by mouth 2 (two) times daily. 180 tablet 3  . GLUCOSAMINE-CHONDROITIN PO Take 1 tablet by mouth daily.     . hydrALAZINE (APRESOLINE) 25 MG tablet Take 1 tablet by mouth twice daily 180 tablet 0  . losartan (COZAAR) 100 MG tablet Take 1 tablet by mouth once daily 90 tablet 1  . Multiple Vitamin (MULTIVITAMIN) tablet Take 1 tablet by mouth daily.    . Saw Palmetto, Serenoa repens, (SAW PALMETTO PO) Take 1 tablet by mouth daily.    [name] testosterone cypionate (DEPOTESTOSTERONE CYPIONATE) 200 MG/ML injection Inject 50 mg into the muscle once a week.     . triamcinolone cream (KENALOG) 0.1 % APPLY CREAM EXTERNALLY TO RASH AS DIRECTED TWICE DAILY AS NEEDED    . doxycycline (VIBRA-TABS) 100 MG tablet Take 1 tablet (100 mg total) by mouth 2 (two) times daily.  20 tablet 0   No current facility-administered medications for this visit.     Allergies: Lisinopril  Past Medical History:  Diagnosis Date  . Arthritis    neck  . Dysrhythmia    afib - cardioverted 01/2015  . History of kidney stones   . Hypertension   . Melanoma ([place]) 2000   back; stage 3, squamous 1 - basal- several  . Neuromuscular disorder ([place])   . Phlebitis   . Pneumonia   . Seasonal allergies   . Shortness of breath dyspnea    with exertion  . Tubular adenoma of colon 2013   colonoscpy in 5 years [name]    Past Surgical History:  Procedure Laterality Date  . 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 CARDIOVERSION N/A 12/01/2014   Procedure: CARDIOVERSION;  Surgeon: Troy Sine, MD;  Location: [facility];  Service: Cardiovascular;  Laterality: N/A;  . CATARACT EXTRACTION  2002, 2004   bilateral  . CYSTOSCOPY    . ELBOW SURGERY Left 1984   tendon repair left arm  . INCISION AND DRAINAGE OF WOUND Left 08/28/2016   Procedure: IRRIGATION AND DEBRIDEMENT WOUND;  Surgeon: Iran Planas, MD;  Location: [facility];  Service: Orthopedics;  Laterality: Left;  . LITHOTRIPSY    . melanoma removal  2000   back  . METACARPAL OSTEOTOMY Left 08/28/2016  Procedure: 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.;  Surgeon: Iran Planas, MD;  Location: [facility];  Service: Orthopedics;  Laterality: Left;  [name] MICROLARYNGOSCOPY WITH LASER N/A 01/25/2015   Procedure: MICROLARYNGOSCOPY ;  Surgeon: Melida Quitter, MD;  Location: [facility];  Service: ENT;  Laterality: N/A;  Microlaryngoscopy with 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 with CO2 laser   . TENDON REPAIR Right 2012   right  . TONSILLECTOMY    . UPPER GI ENDOSCOPY      Family History  Problem Relation Age of Onset  . Heart disease Mother   . Heart disease Sister   . Heart disease Sister   . Hypertension Sister   . Colon cancer Neg Hx   . Stomach cancer Neg Hx      Social History   Tobacco Use  . Smoking status: Former Smoker    Start date: 11/20/1976  . Smokeless tobacco: Never Used  . Tobacco comment: quit smoking 1986 ish  Substance Use Topics  . Alcohol use: Not Currently    Alcohol/week: 3.0 - 4.0 standard drinks    Types: 3 - 4 Standard drinks or equivalent per week    Subjective:  Patient presents today as new patient; majority of healthcare needs are managed by cardiology;  History of A. Fib- sees Dr. Claiborne Billings; under care of urology- history of elevated PSA/ [facility]; has dermatology ( Dr. Wilhemina Bonito) every 6 months- history of melanoma ( Level 3);  Was bitten by a tick in the past week- tick was between his toes and was there for probably a week; no fever, rash or joint pain; has been using Hydrogen Peroxide;     Objective:  Vitals:   09/14/18 0916  BP: (!) 142/82  Pulse: 69  Temp: 98 F (36.7 C)  TempSrc: Oral  SpO2: 96%  Weight: 203 lb (92.1 kg)  Height: 6' (1.829 m)    General: Well developed, well nourished, in no acute distress  Skin : Warm and dry. Healing lesion between 2nd and 3rd toe left foot Head: Normocephalic and atraumatic  Lungs: Respirations unlabored; clear to auscultation bilaterally without wheeze, rales, rhonchi  Neurologic: Alert and oriented; speech intact; face symmetrical; moves all extremities well; CNII-XII intact without focal deficit   Assessment:  1. Paroxysmal atrial fibrillation (HCC)   2. HYPERTENSION, BENIGN   3. OSA on CPAP   4. Hypogonadism male   5. Malignant melanoma, unspecified site ([place])   6. Cellulitis of toe of left foot     Plan:  1. Under care of cardiology- encouraged to schedule followup; 2. Managed by cardiology; 3. Managed by cardiology; 4. Encouraged to see urology; 5. Under care of dermatology; 6. Rx for Doxycycline 100 mg bid x 10 days;    Return for January 2021- AWV with Jill/ OV with me on same day.  No orders of the defined types were placed in this  encounter.   Requested Prescriptions   Signed Prescriptions Disp Refills  . doxycycline (VIBRA-TABS) 100 MG tablet 20 tablet 0    Sig: Take 1 tablet (100 mg total) by mouth 2 (two) times daily.

## 2018-09-16 NOTE — Telephone Encounter (Signed)
Order for [employer] (range 8-20) faxed to [facility] per patient and wife's request.

## 2018-09-24 ENCOUNTER — Other Ambulatory Visit: Payer: Self-pay | Admitting: Physician Assistant

## 2018-10-14 DIAGNOSIS — G4733 Obstructive sleep apnea (adult) (pediatric): Secondary | ICD-10-CM | POA: Diagnosis not present

## 2018-11-05 ENCOUNTER — Other Ambulatory Visit: Payer: Self-pay | Admitting: Cardiovascular Disease

## 2018-11-16 ENCOUNTER — Other Ambulatory Visit: Payer: Self-pay | Admitting: Cardiovascular Disease

## 2018-11-18 DIAGNOSIS — E291 Testicular hypofunction: Secondary | ICD-10-CM | POA: Diagnosis not present

## 2018-11-25 DIAGNOSIS — E291 Testicular hypofunction: Secondary | ICD-10-CM | POA: Diagnosis not present

## 2018-11-25 DIAGNOSIS — N401 Enlarged prostate with lower urinary tract symptoms: Secondary | ICD-10-CM | POA: Diagnosis not present

## 2018-11-25 DIAGNOSIS — R351 Nocturia: Secondary | ICD-10-CM | POA: Diagnosis not present

## 2018-11-25 DIAGNOSIS — N5201 Erectile dysfunction due to arterial insufficiency: Secondary | ICD-10-CM | POA: Diagnosis not present

## 2018-12-01 DIAGNOSIS — H52203 Unspecified astigmatism, bilateral: Secondary | ICD-10-CM | POA: Diagnosis not present

## 2018-12-01 DIAGNOSIS — Z961 Presence of intraocular lens: Secondary | ICD-10-CM | POA: Diagnosis not present

## 2018-12-14 DIAGNOSIS — R69 Illness, unspecified: Secondary | ICD-10-CM | POA: Diagnosis not present

## 2018-12-25 ENCOUNTER — Other Ambulatory Visit: Payer: Self-pay | Admitting: Physician Assistant

## 2019-01-11 DIAGNOSIS — C44612 Basal cell carcinoma of skin of right upper limb, including shoulder: Secondary | ICD-10-CM | POA: Diagnosis not present

## 2019-01-11 DIAGNOSIS — Z85820 Personal history of malignant melanoma of skin: Secondary | ICD-10-CM | POA: Diagnosis not present

## 2019-01-11 DIAGNOSIS — L570 Actinic keratosis: Secondary | ICD-10-CM | POA: Diagnosis not present

## 2019-01-11 DIAGNOSIS — B351 Tinea unguium: Secondary | ICD-10-CM | POA: Diagnosis not present

## 2019-01-11 DIAGNOSIS — L111 Transient acantholytic dermatosis [Grover]: Secondary | ICD-10-CM | POA: Diagnosis not present

## 2019-01-11 DIAGNOSIS — D1801 Hemangioma of skin and subcutaneous tissue: Secondary | ICD-10-CM | POA: Diagnosis not present

## 2019-01-11 DIAGNOSIS — C44319 Basal cell carcinoma of skin of other parts of face: Secondary | ICD-10-CM | POA: Diagnosis not present

## 2019-01-11 DIAGNOSIS — C44519 Basal cell carcinoma of skin of other part of trunk: Secondary | ICD-10-CM | POA: Diagnosis not present

## 2019-01-11 DIAGNOSIS — Z85828 Personal history of other malignant neoplasm of skin: Secondary | ICD-10-CM | POA: Diagnosis not present

## 2019-01-11 DIAGNOSIS — D485 Neoplasm of uncertain behavior of skin: Secondary | ICD-10-CM | POA: Diagnosis not present

## 2019-01-11 DIAGNOSIS — L821 Other seborrheic keratosis: Secondary | ICD-10-CM | POA: Diagnosis not present

## 2019-01-22 DIAGNOSIS — G4733 Obstructive sleep apnea (adult) (pediatric): Secondary | ICD-10-CM | POA: Diagnosis not present

## 2019-02-17 ENCOUNTER — Other Ambulatory Visit: Payer: Self-pay | Admitting: Physician Assistant

## 2019-03-01 ENCOUNTER — Other Ambulatory Visit: Payer: Self-pay | Admitting: Cardiovascular Disease

## 2019-03-02 NOTE — Telephone Encounter (Signed)
Rx(s) sent to pharmacy electronically.  

## 2019-03-10 ENCOUNTER — Ambulatory Visit: Payer: Medicare HMO

## 2019-03-10 ENCOUNTER — Encounter: Payer: Self-pay | Admitting: Family

## 2019-03-10 ENCOUNTER — Ambulatory Visit (INDEPENDENT_AMBULATORY_CARE_PROVIDER_SITE_OTHER): Payer: Medicare HMO | Admitting: Family

## 2019-03-10 ENCOUNTER — Other Ambulatory Visit: Payer: Self-pay

## 2019-03-10 VITALS — BP 132/62 | HR 68 | Temp 97.9°F | Ht 72.0 in | Wt 208.2 lb

## 2019-03-10 DIAGNOSIS — C439 Malignant melanoma of skin, unspecified: Secondary | ICD-10-CM

## 2019-03-10 DIAGNOSIS — Z13220 Encounter for screening for lipoid disorders: Secondary | ICD-10-CM

## 2019-03-10 DIAGNOSIS — E291 Testicular hypofunction: Secondary | ICD-10-CM

## 2019-03-10 DIAGNOSIS — I10 Essential (primary) hypertension: Secondary | ICD-10-CM | POA: Diagnosis not present

## 2019-03-10 DIAGNOSIS — I480 Paroxysmal atrial fibrillation: Secondary | ICD-10-CM

## 2019-03-10 LAB — COMPREHENSIVE METABOLIC PANEL
ALT: 15 U/L (ref 0–53)
AST: 19 U/L (ref 0–37)
Albumin: 4.2 g/dL (ref 3.5–5.2)
Alkaline Phosphatase: 64 U/L (ref 39–117)
BUN: 24 mg/dL — ABNORMAL HIGH (ref 6–23)
CO2: 27 mEq/L (ref 19–32)
Calcium: 9.8 mg/dL (ref 8.4–10.5)
Chloride: 103 mEq/L (ref 96–112)
Creatinine, Ser: 1.25 mg/dL (ref 0.40–1.50)
GFR: 55.8 mL/min — ABNORMAL LOW (ref >60.00)
Glucose, Bld: 92 mg/dL (ref 70–99)
Potassium: 4.6 mEq/L (ref 3.5–5.1)
Sodium: 135 mEq/L (ref 135–145)
Total Bilirubin: 0.6 mg/dL (ref 0.2–1.2)
Total Protein: 6.8 g/dL (ref 6.0–8.3)

## 2019-03-10 LAB — CBC WITH DIFFERENTIAL/PLATELET
Basophils Absolute: 0.1 10*3/uL (ref 0.0–0.1)
Basophils Relative: 1.2 % (ref 0.0–3.0)
Eosinophils Absolute: 0.2 10*3/uL (ref 0.0–0.7)
Eosinophils Relative: 2.7 % (ref 0.0–5.0)
HCT: 42.2 % (ref 39.0–52.0)
Hemoglobin: 14.5 g/dL (ref 13.0–17.0)
Lymphocytes Relative: 20.4 % (ref 12.0–46.0)
Lymphs Abs: 1.3 10*3/uL (ref 0.7–4.0)
MCHC: 34.4 g/dL (ref 30.0–36.0)
MCV: 95.2 fl (ref 78.0–100.0)
Monocytes Absolute: 0.9 10*3/uL (ref 0.1–1.0)
Monocytes Relative: 14.3 % — ABNORMAL HIGH (ref 3.0–12.0)
Neutro Abs: 4 10*3/uL (ref 1.4–7.7)
Neutrophils Relative %: 61.4 % (ref 43.0–77.0)
Platelets: 216 10*3/uL (ref 150.0–400.0)
RBC: 4.44 Mil/uL (ref 4.22–5.81)
RDW: 12.9 % (ref 11.5–15.5)
WBC: 6.5 10*3/uL (ref 4.0–10.5)

## 2019-03-10 LAB — LIPID PANEL
Cholesterol: 159 mg/dL (ref 0–200)
HDL: 52.5 mg/dL (ref >39.00)
LDL Cholesterol: 79 mg/dL (ref 0–99)
NonHDL: 106.06
Total CHOL/HDL Ratio: 3
Triglycerides: 137 mg/dL (ref 0.0–149.0)
VLDL: 27.4 mg/dL (ref 0.0–40.0)

## 2019-03-10 NOTE — Progress Notes (Signed)
Mario [name] is a 79 y.o. male with the following history as recorded in EpicCare:  Patient Active Problem List   Diagnosis Date Noted  . Atypical chest pain   . Near syncope   . Swelling of both ankles 07/02/2015  . 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 OSA on CPAP 01/12/2015  . Anticoagulation adequate 11/03/2014  . Atrial fibrillation ([place]) 09/29/2014  . History of colonic polyps 09/20/2014  . 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 Stricture and stenosis of esophagus 06/20/2008  . 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 HYPERTENSION, BENIGN 05/06/2006  . ELEVATED PROSTATE SPECIFIC ANTIGEN 05/06/2006  . Melanoma ([place]) 04/10/2006  . CATARACT 04/10/2006  . Diverticulitis of colon 04/10/2006  . NEPHROLITHIASIS 04/10/2006    Current Outpatient Medications  Medication Sig Dispense Refill  . amLODipine (NORVASC) 10 MG tablet Take 1 tablet by mouth once daily 90 tablet 0  . carvedilol (COREG) 3.125 MG tablet Take 1 tablet by mouth twice daily 180 tablet 0  . ELIQUIS 5 MG TABS tablet Take 1 tablet by mouth twice daily 180 tablet 0  . flecainide (TAMBOCOR) 100 MG tablet Take 1 tablet (100 mg total) by mouth 2 (two) times daily. 180 tablet 3  . GLUCOSAMINE-CHONDROITIN PO Take 1 tablet by mouth daily.     . hydrALAZINE (APRESOLINE) 25 MG tablet Take 1 tablet by mouth twice daily 180 tablet 0  . losartan (COZAAR) 100 MG tablet Take 1 tablet by mouth once daily 90 tablet 1  . Multiple Vitamin (MULTIVITAMIN) tablet Take 1 tablet by mouth daily.    . Saw Palmetto, Serenoa repens, (SAW PALMETTO PO) Take 1 tablet by mouth daily.    [name] testosterone cypionate (DEPOTESTOSTERONE CYPIONATE) 200 MG/ML injection Inject 50 mg into the muscle once a week.     . triamcinolone cream (KENALOG) 0.1 % APPLY CREAM EXTERNALLY TO RASH AS DIRECTED TWICE DAILY AS NEEDED     No current facility-administered medications for this visit.    Allergies: Lisinopril  Past  Medical History:  Diagnosis Date  . Arthritis    neck  . Dysrhythmia    afib - cardioverted 01/2015  . History of kidney stones   . Hypertension   . Melanoma ([place]) 2000   back; stage 3, squamous 1 - basal- several  . Neuromuscular disorder ([place])   . Phlebitis   . Pneumonia   . Seasonal allergies   . Shortness of breath dyspnea    with exertion  . Tubular adenoma of colon 2013   colonoscpy in 5 years [name]    Past Surgical History:  Procedure Laterality Date  . 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 CARDIOVERSION N/A 12/01/2014   Procedure: CARDIOVERSION;  Surgeon: Troy Sine, MD;  Location: [facility];  Service: Cardiovascular;  Laterality: N/A;  . CATARACT EXTRACTION  2002, 2004   bilateral  . CYSTOSCOPY    . ELBOW SURGERY Left 1984   tendon repair left arm  . INCISION AND DRAINAGE OF WOUND Left 08/28/2016   Procedure: IRRIGATION AND DEBRIDEMENT WOUND;  Surgeon: Iran Planas, MD;  Location: [facility];  Service: Orthopedics;  Laterality: Left;  . LITHOTRIPSY    . melanoma removal  2000   back  . METACARPAL OSTEOTOMY Left 08/28/2016   Procedure: 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.;  Surgeon: Iran Planas, MD;  Location: [facility];  Service: Orthopedics;  Laterality:  Left;  . MICROLARYNGOSCOPY WITH LASER N/A 01/25/2015   Procedure: MICROLARYNGOSCOPY ;  Surgeon: Melida Quitter, MD;  Location: [facility];  Service: ENT;  Laterality: N/A;  Microlaryngoscopy with 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 with CO2 laser   . TENDON REPAIR Right 2012   right  . TONSILLECTOMY    . UPPER GI ENDOSCOPY      Family History  Problem Relation Age of Onset  . Heart disease Mother   . Heart disease Sister   . Heart disease Sister   . Hypertension Sister   . Colon cancer Neg Hx   . Stomach cancer Neg Hx     Social History   Tobacco Use  . Smoking status: Former Smoker    Start date: 11/20/1976  . Smokeless tobacco:  Never Used  . Tobacco comment: quit smoking 1986 ish  Substance Use Topics  . Alcohol use: Not Currently    Alcohol/week: 3.0 - 4.0 standard drinks    Types: 3 - 4 Standard drinks or equivalent per week    Subjective:  6 month follow up on chronic care needs- majority of healthcare needs are managed by specialists; Dr. Claiborne Billings- cardiology for hypertension/ A. Fib; overdue to see his cardiologist; last OV there was telemedicine visit in 04/2019; Sees dermatology every 4-6 months; Sees urology- Dr. Gloriann Loan managing testosterone injection; wife gives injections at home;   Is scheduled for second COVID vaccine on February 6;     Objective:  Vitals:   03/10/19 1101  BP: 132/62  Pulse: 68  Temp: 97.9 F (36.6 C)  TempSrc: Oral  SpO2: 95%  Weight: 208 lb 3.2 oz (94.4 kg)  Height: 6' (1.829 m)    General: Well developed, well nourished, in no acute distress  Skin : Warm and dry. Papular rash noted over trunk- per patient, chronic Head: Normocephalic and atraumatic  Lungs: Respirations unlabored; clear to auscultation bilaterally without wheeze, rales, rhonchi  CVS exam: normal rate and regular rhythm.  Neurologic: Alert and oriented; speech intact; face symmetrical; moves all extremities well; CNII-XII intact without focal deficit   Assessment:  1. Essential hypertension   2. PAF (paroxysmal atrial fibrillation) (HCC) Chronic  3. Lipid screening   4. Malignant melanoma, unspecified site ([place]) Chronic  5. Hypogonadism male     Plan:  1. Stable; check CBC, CMP today; he is due to see his cardiologist- stressed need to schedule follow-up; 2. No A. Fib noted on exam today- needs to see his cardiologist; 3. Check lipid panel; 4. Sees dermatology regularly;  5. Under care of urology;  Plan to follow-up in 1 year, sooner prn.   This visit occurred during the SARS-CoV-2 public health emergency.  Safety protocols were in place, including screening questions prior to the visit,  additional usage of staff PPE, and extensive cleaning of exam room while observing appropriate contact time as indicated for disinfecting solutions.    No follow-ups on file.  Orders Placed This Encounter  Procedures  . CBC w/Diff  . Comp Met (CMET)  . Lipid panel    Requested Prescriptions    No prescriptions requested or ordered in this encounter

## 2019-03-22 ENCOUNTER — Other Ambulatory Visit: Payer: Self-pay | Admitting: Physician Assistant

## 2019-04-10 ENCOUNTER — Other Ambulatory Visit: Payer: Self-pay | Admitting: Cardiology

## 2019-04-24 ENCOUNTER — Other Ambulatory Visit: Payer: Self-pay | Admitting: Physician Assistant

## 2019-04-29 ENCOUNTER — Other Ambulatory Visit: Payer: Self-pay | Admitting: Cardiovascular Disease

## 2019-04-29 ENCOUNTER — Other Ambulatory Visit: Payer: Self-pay | Admitting: Physician Assistant

## 2019-04-29 ENCOUNTER — Telehealth: Payer: Self-pay | Admitting: Cardiovascular Disease

## 2019-04-29 ENCOUNTER — Other Ambulatory Visit: Payer: Self-pay

## 2019-04-29 MED ORDER — HYDRALAZINE HCL 25 MG PO TABS: 25.0000 mg | ORAL_TABLET | Freq: Two times a day (BID) | ORAL | 0 refills | Status: DC

## 2019-04-29 NOTE — Telephone Encounter (Signed)
Received a call from patient he stated he is out of Hydralazine and will need refilled before his appointment with Jory Sims DNP 05/03/19.Advised refill sent to pharmacy.

## 2019-04-29 NOTE — Telephone Encounter (Signed)
New message   Patient needs a new prescription of hydrALAZINE (APRESOLINE) 25 MG tablet sent to [facility] [place]  Patient is out of medication.

## 2019-04-29 NOTE — Telephone Encounter (Signed)
RX sent in for the pts Hydralazine... LM that he needs to call and make a follow up to be seen.

## 2019-05-02 NOTE — Progress Notes (Signed)
Cardiology Office Note   Date:  05/03/2019   ID:  Mario [name], DOB 02-08-1941, MRN [id number]  PCP:  Marrian Salvage, FNP  Cardiologist:  Dr. Claiborne Billings  CV: Follow up   History of Present Illness: Mario [name] is a 79 y.o. male who presents for ongoing assessment and management of PAF, arrhythmia's to include PAC's and PVC's. Myoview in 2016 was low risk. He is also being followed by Dr. Claiborne Billings for OSA,  CPAP  100% compliance  He has been on Flecainide 100 BID, and Eliquis for anticoagulation. He underwent a repeat echo Doppler study on April 30, 2018.  This shows normal systolic function with an EF of 60 to 65%.  There is mild to moderate biatrial enlargement. There is evidence for mild aortic stenosis with a mean gradient of 10 and peak gradient of 18.  He had mild aortic root dilatation at 41 mm  Was last seen by Dr. Claiborne Billings on 05/12/2018 At that time, his BP was not well controlled but he had not taken his medication prior to coming to the visit. He was continued on amlodipine 10 mg , coreg 3/125 mg BID, losartan 100 mg daily, and hydralazine 25 mg BID.  He comes today without any cardiac complaints.  He requires refills on his medications.  He has been medically compliant.  He has not had any recurrence of rapid heart rate or palpitations.  He remains on Eliquis as directed.  Past Medical History:  Diagnosis Date  . Arthritis    neck  . Dysrhythmia    afib - cardioverted 01/2015  . History of kidney stones   . Hypertension   . Melanoma ([place]) 2000   back; stage 3, squamous 1 - basal- several  . Neuromuscular disorder ([place])   . Phlebitis   . Pneumonia   . Seasonal allergies   . Shortness of breath dyspnea    with exertion  . Tubular adenoma of colon 2013   colonoscpy in 5 years [name]    Past Surgical History:  Procedure Laterality Date  . 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 CARDIOVERSION N/A 12/01/2014   Procedure: CARDIOVERSION;  Surgeon: Troy Sine, MD;  Location: [facility];  Service: Cardiovascular;  Laterality: N/A;  . CATARACT EXTRACTION  2002, 2004   bilateral  . CYSTOSCOPY    . ELBOW SURGERY Left 1984   tendon repair left arm  . INCISION AND DRAINAGE OF WOUND Left 08/28/2016   Procedure: IRRIGATION AND DEBRIDEMENT WOUND;  Surgeon: Iran Planas, MD;  Location: [facility];  Service: Orthopedics;  Laterality: Left;  . LITHOTRIPSY    . melanoma removal  2000   back  . METACARPAL OSTEOTOMY Left 08/28/2016   Procedure: 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.;  Surgeon: Iran Planas, MD;  Location: [facility];  Service: Orthopedics;  Laterality: Left;  [name] MICROLARYNGOSCOPY WITH LASER N/A 01/25/2015   Procedure: MICROLARYNGOSCOPY ;  Surgeon: Melida Quitter, MD;  Location: [facility];  Service: ENT;  Laterality: N/A;  Microlaryngoscopy with 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 with CO2 laser   . TENDON REPAIR Right 2012   right  . TONSILLECTOMY    . UPPER GI ENDOSCOPY       Current Outpatient Medications  Medication Sig Dispense Refill  . amLODipine (NORVASC) 10 MG tablet Take 1 tablet (10 mg total) by mouth daily. 90 tablet 3  . apixaban (ELIQUIS) 5 MG TABS  tablet Take 1 tablet (5 mg total) by mouth 2 (two) times daily. 180 tablet 3  . carvedilol (COREG) 3.125 MG tablet Take 1 tablet (3.125 mg total) by mouth 2 (two) times daily. 180 tablet 3  . flecainide (TAMBOCOR) 100 MG tablet Take 1 tablet (100 mg total) by mouth 2 (two) times daily. 180 tablet 3  . GLUCOSAMINE-CHONDROITIN PO Take 1 tablet by mouth daily.     . hydrALAZINE (APRESOLINE) 25 MG tablet Take 1 tablet (25 mg total) by mouth 2 (two) times daily. Please schedule annual appt for refills. [phone]. 180 tablet 3  . losartan (COZAAR) 100 MG tablet Take 1 tablet (100 mg total) by mouth daily. 90 tablet 3  . Multiple Vitamin (MULTIVITAMIN) tablet Take 1 tablet by mouth daily.    . Saw Palmetto, Serenoa repens, (SAW PALMETTO  PO) Take 1 tablet by mouth daily.    [name] testosterone cypionate (DEPOTESTOSTERONE CYPIONATE) 200 MG/ML injection Inject 50 mg into the muscle once a week.     . triamcinolone cream (KENALOG) 0.1 % APPLY CREAM EXTERNALLY TO RASH AS DIRECTED TWICE DAILY AS NEEDED     No current facility-administered medications for this visit.    Allergies:   Lisinopril    Social History:  The patient  reports that he has quit smoking. He started smoking about 42 years ago. He has never used smokeless tobacco. He reports previous alcohol use of about 3.0 - 4.0 standard drinks of alcohol per week. He reports that he does not use drugs.   Family History:  The patient's family history includes Heart disease in his mother, sister, and sister; Hypertension in his sister.    ROS: All other systems are reviewed and negative. Unless otherwise mentioned in H&P    PHYSICAL EXAM: VS:  BP 128/60 (BP Location: Left Arm, Patient Position: Sitting, Cuff Size: Normal)   Pulse 72   Temp 97.9 F (36.6 C)   Ht 6' (1.829 m)   Wt 207 lb (93.9 kg)   BMI 28.07 kg/m  , BMI Body mass index is 28.07 kg/m. GEN: Well nourished, well developed, in no acute distress HEENT: normal Neck: no JVD, carotid bruits, or masses Cardiac: RRR; no murmurs, rubs, or gallops,no edema  Respiratory:  Clear to auscultation bilaterally, normal work of breathing GI: soft, nontender, nondistended, + BS MS: no deformity or atrophy Skin: warm and dry, no rash Neuro:  Strength and sensation are intact Psych: euthymic mood, full affect   EKG: (Personally reviewed) sinus rhythm with first-degree AV block, PR 234 ms, QTC 389 ms.  Incomplete right bundle branch block with nonspecific ST-T wave abnormalities  Recent Labs: 03/10/2019: ALT 15; BUN 24; Creatinine, Ser 1.25; Hemoglobin 14.5; Platelets 216.0; Potassium 4.6; Sodium 135    Lipid Panel    Component Value Date/Time   CHOL 159 03/10/2019 1130   CHOL 155 06/13/2017 0951   TRIG 137.0  03/10/2019 1130   HDL 52.50 03/10/2019 1130   HDL 45 06/13/2017 0951   CHOLHDL 3 03/10/2019 1130   VLDL 27.4 03/10/2019 1130   LDLCALC 79 03/10/2019 1130   LDLCALC 84 06/13/2017 0951      Wt Readings from Last 3 Encounters:  05/03/19 207 lb (93.9 kg)  03/10/19 208 lb 3.2 oz (94.4 kg)  09/14/18 203 lb (92.1 kg)      Other studies Reviewed: ECHO 04/30/2018 IMPRESSIONS 1. The left ventricle has normal systolic function with an ejection fraction of 60-65%. The cavity size was normal. There is mild  concentric left ventricular hypertrophy. Left ventricular diastolic Doppler parameters are indeterminate. 2. The right ventricle has normal systolic function. The cavity was normal. There is no increase in right ventricular wall thickness. 3. Left atrial size was mild-moderately dilated. 4. The aortic valve is tricuspid Mild thickening of the aortic valve Mild calcification of the aortic valve. Aortic valve regurgitation is mild to moderate by color flow Doppler. mild stenosis of the aortic valve. 5. There is mild to moderate dilatation of the ascending aorta measuring 41 mm  Normal LV EF. Mildly thickened, mildly calcified aortic valve with mild-moderate central AR and very mild stenosis. FINDINGS Left Ventricle: The left ventricle has normal systolic function, with an ejection fraction of 60-65%. The cavity size was normal. There is mild concentric left ventricular hypertrophy. Left ventricular diastolic Doppler parameters are indeterminate. Right Ventricle: The right ventricle has normal systolic function. The cavity was normal. There is no increase in right ventricular wall thickness. Left Atrium: left atrial size was mild-moderately dilated Right Atrium: right atrial size was mild-moderately dilated. Interatrial Septum: No atrial level shunt detected by color flow Doppler. Pericardium: There is no evidence of pericardial effusion. Mitral Valve: The mitral valve is normal in  structure. Mitral valve regurgitation is trivial by color flow Doppler. Tricuspid Valve: The tricuspid valve is normal in structure. Tricuspid valve regurgitation is mild by color flow Doppler. Aortic Valve: The aortic valve is tricuspid Mild thickening of the aortic valve Mild calcification of the aortic valve. Aortic valve regurgitation is mild to moderate by color flow Doppler. There is mild stenosis of the aortic valve, with a calculated  valve area of 2.11 cm. Pulmonic Valve: The pulmonic valve was grossly normal. Pulmonic valve regurgitation is trivial by color flow Doppler. No evidence of pulmonic stenosis. Aorta: There is mild to moderate dilatation of the ascending aorta measuring 41 mm. Venous: The inferior vena cava is normal in size with greater than 50% respiratory variability.   ASSESSMENT AND PLAN:  1.  Hypertension: Has been medically compliant concerning his antihypertensive medications.  He is given refills on amlodipine, carvedilol, and hydralazine.  Along with losartan.  He will need to have follow-up labs in the next 3 months for ongoing management and surveillance of kidney function.  He just had labs completed in January 2021 which were within normal limits.  2.  Paroxysmal atrial fibrillation: Remains on flecainide and Eliquis.  He offers no complaints of bleeding, heart rate has been well controlled and he denies any rapid or irregular heart rhythm.  3.  OSA on CPAP: No changes to be followed by Dr. Claiborne Billings.     Current medicines are reviewed at length with the patient today.  I have spent 45 minutes dedicated to the care of this patient on the date of this encounter to include pre-visit review of records, assessment, management and diagnostic testing,with shared decision making.  Labs/ tests ordered today include: None Phill Myron. West Pugh, ANP, AACC   05/03/2019 12:34 PM    [facility] [address] Office [phone] Fax  [phone]  Notice: This dictation was prepared with Dragon dictation along with smaller phrase technology. Any transcriptional errors that result from this process are unintentional and may not be corrected upon review.

## 2019-05-03 ENCOUNTER — Other Ambulatory Visit: Payer: Self-pay

## 2019-05-03 ENCOUNTER — Ambulatory Visit: Payer: Medicare HMO | Admitting: Adult Health

## 2019-05-03 ENCOUNTER — Encounter: Payer: Self-pay | Admitting: Adult Health

## 2019-05-03 VITALS — BP 128/60 | HR 72 | Temp 97.9°F | Ht 72.0 in | Wt 207.0 lb

## 2019-05-03 DIAGNOSIS — I480 Paroxysmal atrial fibrillation: Secondary | ICD-10-CM | POA: Diagnosis not present

## 2019-05-03 DIAGNOSIS — G4733 Obstructive sleep apnea (adult) (pediatric): Secondary | ICD-10-CM

## 2019-05-03 DIAGNOSIS — Z9989 Dependence on other enabling machines and devices: Secondary | ICD-10-CM | POA: Diagnosis not present

## 2019-05-03 DIAGNOSIS — I10 Essential (primary) hypertension: Secondary | ICD-10-CM

## 2019-05-03 MED ORDER — LOSARTAN POTASSIUM 100 MG PO TABS: 100.0000 mg | ORAL_TABLET | Freq: Every day | ORAL | 3 refills | Status: DC

## 2019-05-03 MED ORDER — FLECAINIDE ACETATE 100 MG PO TABS: 100.0000 mg | ORAL_TABLET | Freq: Two times a day (BID) | ORAL | 3 refills | Status: DC

## 2019-05-03 MED ORDER — APIXABAN 5 MG PO TABS: 5.0000 mg | ORAL_TABLET | Freq: Two times a day (BID) | ORAL | 3 refills | Status: DC

## 2019-05-03 MED ORDER — HYDRALAZINE HCL 25 MG PO TABS: 25.0000 mg | ORAL_TABLET | Freq: Two times a day (BID) | ORAL | 3 refills | Status: DC

## 2019-05-03 MED ORDER — CARVEDILOL 3.125 MG PO TABS: 3.1250 mg | ORAL_TABLET | Freq: Two times a day (BID) | ORAL | 3 refills | Status: DC

## 2019-05-03 MED ORDER — AMLODIPINE BESYLATE 10 MG PO TABS: 10.0000 mg | ORAL_TABLET | Freq: Every day | ORAL | 3 refills | Status: DC

## 2019-05-03 NOTE — Patient Instructions (Signed)
Medication Instructions:  Continue current medications  *If you need a refill on your cardiac medications before your next appointment, please call your pharmacy*   Lab Work: None Ordered   Testing/Procedures: None Ordered   Follow-Up: At [facility], you and your health needs are our priority.  As part of our continuing mission to provide you with exceptional heart care, we have created designated Provider Care Teams.  These Care Teams include your primary Cardiologist (physician) and Advanced Practice Providers (APPs -  Physician Assistants and Nurse Practitioners) who all work together to provide you with the care you need, when you need it.  We recommend signing up for the patient portal called "MyChart".  Sign up information is provided on this After Visit Summary.  MyChart is used to connect with patients for Virtual Visits (Telemedicine).  Patients are able to view lab/test results, encounter notes, upcoming appointments, etc.  Non-urgent messages can be sent to your provider as well.   To learn more about what you can do with MyChart, go to [email or web address].    Your next appointment:   6 month(s)  The format for your next appointment:   In Person  Provider:   You may see Tacoya Altizer Kelly, MD or one of the following Advanced Practice Providers on your designated Care Team:    Hao Meng, PA-C  Angela Duke, PA-C or   Krista Kroeger, PA-C     

## 2019-05-06 ENCOUNTER — Other Ambulatory Visit (INDEPENDENT_AMBULATORY_CARE_PROVIDER_SITE_OTHER): Payer: Medicare HMO

## 2019-05-06 DIAGNOSIS — I10 Essential (primary) hypertension: Secondary | ICD-10-CM

## 2019-05-10 ENCOUNTER — Other Ambulatory Visit: Payer: Self-pay | Admitting: Cardiovascular Disease

## 2019-05-22 ENCOUNTER — Other Ambulatory Visit: Payer: Self-pay | Admitting: Physician Assistant

## 2019-06-02 DIAGNOSIS — M25511 Pain in right shoulder: Secondary | ICD-10-CM | POA: Diagnosis not present

## 2019-06-14 DIAGNOSIS — R69 Illness, unspecified: Secondary | ICD-10-CM | POA: Diagnosis not present

## 2019-06-16 DIAGNOSIS — G4733 Obstructive sleep apnea (adult) (pediatric): Secondary | ICD-10-CM | POA: Diagnosis not present

## 2019-07-06 DIAGNOSIS — L918 Other hypertrophic disorders of the skin: Secondary | ICD-10-CM | POA: Diagnosis not present

## 2019-07-06 DIAGNOSIS — Z85828 Personal history of other malignant neoplasm of skin: Secondary | ICD-10-CM | POA: Diagnosis not present

## 2019-07-06 DIAGNOSIS — D485 Neoplasm of uncertain behavior of skin: Secondary | ICD-10-CM | POA: Diagnosis not present

## 2019-07-06 DIAGNOSIS — D1801 Hemangioma of skin and subcutaneous tissue: Secondary | ICD-10-CM | POA: Diagnosis not present

## 2019-07-06 DIAGNOSIS — L821 Other seborrheic keratosis: Secondary | ICD-10-CM | POA: Diagnosis not present

## 2019-07-06 DIAGNOSIS — L111 Transient acantholytic dermatosis [Grover]: Secondary | ICD-10-CM | POA: Diagnosis not present

## 2019-07-06 DIAGNOSIS — L570 Actinic keratosis: Secondary | ICD-10-CM | POA: Diagnosis not present

## 2019-07-06 DIAGNOSIS — Z85820 Personal history of malignant melanoma of skin: Secondary | ICD-10-CM | POA: Diagnosis not present

## 2019-07-06 DIAGNOSIS — L820 Inflamed seborrheic keratosis: Secondary | ICD-10-CM | POA: Diagnosis not present

## 2019-07-06 DIAGNOSIS — L814 Other melanin hyperpigmentation: Secondary | ICD-10-CM | POA: Diagnosis not present

## 2019-09-01 ENCOUNTER — Ambulatory Visit: Payer: Medicare HMO | Admitting: Gastroenterology

## 2019-09-01 ENCOUNTER — Other Ambulatory Visit (INDEPENDENT_AMBULATORY_CARE_PROVIDER_SITE_OTHER): Payer: Medicare HMO

## 2019-09-01 ENCOUNTER — Telehealth: Payer: Self-pay | Admitting: Cardiovascular Disease

## 2019-09-01 ENCOUNTER — Telehealth: Payer: Self-pay

## 2019-09-01 ENCOUNTER — Encounter: Payer: Self-pay | Admitting: Gastroenterology

## 2019-09-01 VITALS — BP 120/60 | HR 72 | Ht 72.0 in | Wt 200.8 lb

## 2019-09-01 DIAGNOSIS — K921 Melena: Secondary | ICD-10-CM

## 2019-09-01 DIAGNOSIS — Z7902 Long term (current) use of antithrombotics/antiplatelets: Secondary | ICD-10-CM

## 2019-09-01 DIAGNOSIS — Z86010 Personal history of colonic polyps: Secondary | ICD-10-CM | POA: Diagnosis not present

## 2019-09-01 LAB — CBC WITH DIFFERENTIAL/PLATELET
Basophils Absolute: 0 10*3/uL (ref 0.0–0.1)
Basophils Relative: 0.7 % (ref 0.0–3.0)
Eosinophils Absolute: 0.1 10*3/uL (ref 0.0–0.7)
Eosinophils Relative: 2.4 % (ref 0.0–5.0)
HCT: 42 % (ref 39.0–52.0)
Hemoglobin: 14.7 g/dL (ref 13.0–17.0)
Lymphocytes Relative: 20.2 % (ref 12.0–46.0)
Lymphs Abs: 1.2 10*3/uL (ref 0.7–4.0)
MCHC: 34.9 g/dL (ref 30.0–36.0)
MCV: 93 fl (ref 78.0–100.0)
Monocytes Absolute: 0.8 10*3/uL (ref 0.1–1.0)
Monocytes Relative: 13.6 % — ABNORMAL HIGH (ref 3.0–12.0)
Neutro Abs: 3.9 10*3/uL (ref 1.4–7.7)
Neutrophils Relative %: 63.1 % (ref 43.0–77.0)
Platelets: 208 10*3/uL (ref 150.0–400.0)
RBC: 4.52 Mil/uL (ref 4.22–5.81)
RDW: 14 % (ref 11.5–15.5)
WBC: 6.1 10*3/uL (ref 4.0–10.5)

## 2019-09-01 NOTE — Telephone Encounter (Signed)
Patient returning call to [facility] in regards to a surgical clearance.

## 2019-09-01 NOTE — Patient Instructions (Signed)
If you are age 79 or older, your body mass index should be between 23-30. Your Body mass index is 27.23 kg/m. If this is out of the aforementioned range listed, please consider follow up with your Primary Care Provider.  If you are age 30 or younger, your body mass index should be between 19-25. Your Body mass index is 27.23 kg/m. If this is out of the aformentioned range listed, please consider follow up with your Primary Care Provider.   Your provider has requested that you go to the basement level for lab work before leaving today. Press "B" on the elevator. The lab is located at the first door on the left as you exit the elevator.  Due to recent changes in healthcare laws, you may see the results of your imaging and laboratory studies on MyChart before your provider has had a chance to review them.  We understand that in some cases there may be results that are confusing or concerning to you. Not all laboratory results come back in the same time frame and the provider may be waiting for multiple results in order to interpret others.  Please give [place] 48 hours in order for your provider to thoroughly review all the results before contacting the office for clarification of your results.   You will be contacted by our office prior to your procedure for directions on holding your Eliquis.  If you do not hear from our office 1 week prior to your scheduled procedure, please call [phone] to discuss.   You have been scheduled for an endoscopy and colonoscopy. Please follow the written instructions given to you at your visit today. Please pick up your prep supplies at the pharmacy within the next 1-3 days. If you use inhalers (even only as needed), please bring them with you on the day of your procedure.   It was a pleasure to see you today!  Dr. Loletha Carrow

## 2019-09-01 NOTE — Telephone Encounter (Signed)
Patient with diagnosis of afib on Eliquis for anticoagulation.    Procedure: EGD/colonoscopy Date of procedure: 09/24/19  CHADS2-VASc score of 3 (age x2, HTN)  CrCl 58mL/min Platelet count 208K  Per office protocol, patient can hold Eliquis for 2 days prior to procedure as requested.

## 2019-09-01 NOTE — Progress Notes (Signed)
**Mario [name] De-Identified via Obfuscation** Mario Mario [name]  Chief Complaint: Melena  Subjective  History: From last clinic visit Nov 2018: "This is a 79 year old man last seen by Mario Mario [name] in August 2016 for dysphagia. Per endoscopy was performed at that time. The patient also had a colonoscopy in October 2013 worried diminutive tubular adenoma was removed. He denies any change in bowel habits or rectal bleeding. He denies abdominal pain or dysphagia. His appetite is good and weight stable. Mario Mario [name] has atrial fibrillation for which he is followed by Mario Mario [name], and I read the most recent cardiology office Mario [name] from October 25. Cardioversion did not give a lasting effect, but he is maintained an regular rhythm on flecainide and is on chronic anticoagulation with Eliquis. He has had no stroke or TIA in the past.   Mario Mario [name] works out every day and is a former Clinical research associate."  Colonoscopy scheduled, but patient canceled that day and went to ED for symptoms that he was concerned may be cardiac in nature.  From Cardiology Mario [name] soon afterwards: "More recently, patient presented to the emergency room on 03/11/2018 with persistent A. fib-like symptom and near syncope.  He also complained of chest pain as well.  EKG showed atrial fibrillation with slow heart rate and pauses up to 2 seconds.  Cardiology service was consulted and he was seen by Mario Mario [name].  It was felt he was likely experiencing symptomatic A. Fib.  EKG also showed bradycardia as well.  Atenolol was discontinued and flecainide was further increased to 100 mg twice daily.  He underwent cardioversion in the emergency room with restoration of normal sinus rhythm.  He presents today for cardiology office visit, he denies any further symptoms.  He does continue to have a lot of PACs and PVCs.  He says the symptom was the worst the day after he was released from ED.  It improved slightly over the past few days.  I would recommend addition of a low-dose rate control agent to see if it  will help with his symptoms without causing significant bradycardia."  And most recent cardiology Mario [name] on 05/03/19: "Mario Mario [name] is a 79 y.o. male who presents for ongoing assessment and management of PAF, arrhythmia's to include PAC's and PVC's. Myoview in 2016 was low risk. He is also being followed by Mario Mario [name] for OSA,  CPAP  100% compliance   He has been on Flecainide 100 BID, and Eliquis for anticoagulation. He underwent a repeat echo Doppler study on April 30, 2018.  This shows normal systolic function with an EF of 60 to 65%.  There is mild to moderate biatrial enlargement. There is evidence for mild aortic stenosis with a mean gradient of 10 and peak gradient of 18.  He had mild aortic root dilatation at 41 mm   Was last seen by Mario Mario [name] on 05/12/2018 At that time, his BP was not well controlled but he had not taken his medication prior to coming to the visit. He was continued on amlodipine 10 mg , coreg 3/125 mg BID, losartan 100 mg daily, and hydralazine 25 mg BID.   He comes today without any cardiac complaints.  He requires refills on his medications.  He has been medically compliant.  He has not had any recurrence of rapid heart rate or palpitations.  He remains on Eliquis as directed."  _________________________   Mario Mario [name] came to see me today with concern of passing black tarry stool intermittently over the last 2 months.  He remains  an avid weight lifter, doing so about 3 days a week, and he also reports "100 sit ups a day".  This would give him muscle pain and he would take an Aleve about once a week.  Out of concern this could be bleeding, he stopped taking the Aleve a few weeks ago and has been taking lansoprazole nearly every day.  The black stool has resolved.  He also has some occasional sharp intermittent abdominal pain that might be midline lower or upper, no associated GI symptoms when it occurs. [name] has not seen his primary care provider about the symptoms, no lab work or other  testing done.  ROS: Cardiovascular:  no chest pain Respiratory: no dyspnea He feels his A. fib is been under good control.  He has good exercise tolerance. Remainder of systems negative except as above The patient's Past Medical, Family and Social History were reviewed and are on file in the EMR.  Objective:  Med list reviewed  Current Outpatient Medications:  .  amLODipine (NORVASC) 10 MG tablet, Take 1 tablet (10 mg total) by mouth daily., Disp: 90 tablet, Rfl: 3 .  carvedilol (COREG) 3.125 MG tablet, Take 1 tablet (3.125 mg total) by mouth 2 (two) times daily., Disp: 180 tablet, Rfl: 3 .  ELIQUIS 5 MG TABS tablet, Take 1 tablet by mouth twice daily, Disp: 180 tablet, Rfl: 0 .  flecainide (TAMBOCOR) 100 MG tablet, Take 1 tablet (100 mg total) by mouth 2 (two) times daily., Disp: 180 tablet, Rfl: 3 .  GLUCOSAMINE-CHONDROITIN PO, Take 1 tablet by mouth daily. , Disp: , Rfl:  .  hydrALAZINE (APRESOLINE) 25 MG tablet, Take 1 tablet (25 mg total) by mouth 2 (two) times daily. Please schedule annual appt for refills. [phone]., Disp: 180 tablet, Rfl: 3 .  losartan (COZAAR) 100 MG tablet, Take 1 tablet (100 mg total) by mouth daily., Disp: 90 tablet, Rfl: 3 .  Multiple Vitamin (MULTIVITAMIN) tablet, Take 1 tablet by mouth daily., Disp: , Rfl:  .  Saw Palmetto, Serenoa repens, (SAW PALMETTO PO), Take 1 tablet by mouth daily., Disp: , Rfl:  .  testosterone cypionate (DEPOTESTOSTERONE CYPIONATE) 200 MG/ML injection, Inject 50 mg into the muscle once a week. , Disp: , Rfl:  .  triamcinolone cream (KENALOG) 0.1 %, APPLY CREAM EXTERNALLY TO RASH AS DIRECTED TWICE DAILY AS NEEDED, Disp: , Rfl:    Vital signs in last 24 hrs: Vitals:   09/01/19 1057  BP: 120/60  Pulse: 72    Physical Exam  He is well-appearing  HEENT: sclera anicteric, oral mucosa moist without lesions  Neck: supple, no thyromegaly, JVD or lymphadenopathy  Cardiac: Regular rhythm with soft systolic murmur, I7P8 heard,  no peripheral edema  Pulm: clear to auscultation bilaterally, normal RR and effort noted  Abdomen: soft, no tenderness, with active bowel sounds. No guarding or palpable hepatosplenomegaly.  Skin; warm and dry, no jaundice or rash.  Easy bruising  Labs:  CBC Latest Ref Rng & Units 03/10/2019 03/11/2018 06/13/2017  WBC 4.0 - 10.5 K/uL 6.5 7.0 5.2  Hemoglobin 13.0 - 17.0 g/dL 14.5 15.5 16.2  Hematocrit 39 - 52 % 42.2 47.0 46.3  Platelets 150 - 400 K/uL 216.0 189 215   CMP Latest Ref Rng & Units 03/10/2019 03/11/2018 02/09/2018  Glucose 70 - 99 mg/dL 92 98 87  BUN 6 - 23 mg/dL 24(H) 20 24  Creatinine 0.40 - 1.50 mg/dL 1.25 1.24 1.24  Sodium 135 - 145 mEq/L 135 137 136  Potassium  3.5 - 5.1 mEq/L 4.6 4.2 4.7  Chloride 96 - 112 mEq/L 103 103 101  CO2 19 - 32 mEq/L 27 27 20   Calcium 8.4 - 10.5 mg/dL 9.8 9.3 9.9  Total Protein 6.0 - 8.3 g/dL 6.8 6.9 -  Total Bilirubin 0.2 - 1.2 mg/dL 0.6 0.9 -  Alkaline Phos 39 - 117 U/L 64 61 -  AST 0 - 37 U/L 19 27 -  ALT 0 - 53 U/L 15 28 -    ___________________________________________ Radiologic studies:   ____________________________________________ Other:   _____________________________________________ Assessment & Plan  Assessment: Encounter Diagnoses  Name Primary?  . Melena Yes  . Personal history of colonic polyps   . Long term (current) use of antithrombotics/antiplatelets    Reports of black tarry stool that sound convincing for melena, though thankfully has stopped.  Raises concern for peptic ulcer with use of melena in conjunction with oral anticoagulant. Personal history of colon polyps, overdue for surveillance colonoscopy.  Excellent overall health and functional status.   Plan: CBC today EGD and colonoscopy.  He was agreeable after discussion of procedure and risks.  The benefits and risks of the planned procedure were described in detail with the patient or (when appropriate) their health care proxy.  Risks were outlined  as including, but not limited to, bleeding, infection, perforation, adverse medication reaction leading to cardiac or pulmonary decompensation, pancreatitis (if ERCP).  The limitation of incomplete mucosal visualization was also discussed.  No guarantees or warranties were given.  Must be off Eliquis 1 to 2 days prior, will communicate with his cardiologist about this.  Nickalos understands the small but real risk of stroke during the brief period of time off anticoagulation.  Continue once daily lansoprazole daily in case there is an ulcer not completely healed. Avoid all aspirin or NSAIDs  40 minutes were spent on this encounter (including chart review, history/exam, counseling/coordination of care, and documentation)  Nelida Meuse III

## 2019-09-01 NOTE — Telephone Encounter (Signed)
° °  Primary Cardiologist: Shelva Majestic, MD  Chart reviewed as part of pre-operative protocol coverage. Given past medical history and time since last visit, based on ACC/AHA guidelines, Mario [name] would be at acceptable risk for the planned procedure without further cardiovascular testing.   Patient with diagnosis of afib on Eliquis for anticoagulation.    Procedure: EGD/colonoscopy Date of procedure: 09/24/19  CHADS2-VASc score of 3 (age x2, HTN)  CrCl 11mL/min Platelet count 208K  Per office protocol, patient can hold Eliquis for 2 days prior to procedure as requested.  I will route this recommendation to the requesting party via Epic fax function and remove from pre-op pool.  Please call with questions.  Jossie Ng. Laquana Villari NP-C    09/01/2019, 4:38 PM [facility] [address] Office [phone] Fax [phone]

## 2019-09-01 NOTE — Telephone Encounter (Signed)
[facility] Pre-operative Risk Assessment     Request for surgical clearance:     Endoscopy Procedure  What type of surgery is being performed?     EGD/Colon  When is this surgery scheduled?     10-12-2019  What type of clearance is required ?   Pharmacy  Are there any medications that need to be held prior to surgery and how long? Yes, Eliquis, request to hold 2 day prior  Practice name and name of physician performing surgery?      [facility]  What is your office phone and fax number?      Phone- [phone]    Anesthesia type (None, local, MAC, general) ?       MAC

## 2019-09-02 NOTE — Telephone Encounter (Signed)
Patient has been notified and is aware to hold the Eliquis 2 days before the colon/EGD. No further questions at this time.

## 2019-09-12 HISTORY — PX: COLONOSCOPY WITH ESOPHAGOGASTRODUODENOSCOPY (EGD): SHX5779

## 2019-09-28 DIAGNOSIS — G4733 Obstructive sleep apnea (adult) (pediatric): Secondary | ICD-10-CM | POA: Diagnosis not present

## 2019-10-05 ENCOUNTER — Encounter: Payer: Self-pay | Admitting: Gastroenterology

## 2019-10-12 ENCOUNTER — Ambulatory Visit (AMBULATORY_SURGERY_CENTER): Payer: Medicare HMO | Admitting: Gastroenterology

## 2019-10-12 ENCOUNTER — Encounter: Payer: Self-pay | Admitting: Gastroenterology

## 2019-10-12 ENCOUNTER — Other Ambulatory Visit: Payer: Self-pay

## 2019-10-12 VITALS — BP 110/57 | HR 56 | Temp 97.5°F | Resp 26 | Ht 72.0 in | Wt 200.0 lb

## 2019-10-12 DIAGNOSIS — K921 Melena: Secondary | ICD-10-CM

## 2019-10-12 DIAGNOSIS — Z86010 Personal history of colonic polyps: Secondary | ICD-10-CM

## 2019-10-12 MED ORDER — SODIUM CHLORIDE 0.9 % IV SOLN: 500.0000 mL | Freq: Once | INTRAVENOUS | Status: DC

## 2019-10-12 NOTE — Patient Instructions (Signed)
Resume your Eliquis today at previous dose.  Read all of the handouts given to you by your recovery room nurse.  Thank-you fr choosing [place] for your healthcare needs today..  YOU HAD AN ENDOSCOPIC PROCEDURE TODAY AT [facility]:   Refer to the procedure report that was given to you for any specific questions about what was found during the examination.  If the procedure report does not answer your questions, please call your gastroenterologist to clarify.  If you requested that your care partner not be given the details of your procedure findings, then the procedure report has been included in a sealed envelope for you to review at your convenience later.  YOU SHOULD EXPECT: Some feelings of bloating in the abdomen. Passage of more gas than usual.  Walking can help get rid of the air that was put into your GI tract during the procedure and reduce the bloating. If you had a lower endoscopy (such as a colonoscopy or flexible sigmoidoscopy) you may notice spotting of blood in your stool or on the toilet paper. If you underwent a bowel prep for your procedure, you may not have a normal bowel movement for a few days.  Please Note:  You might notice some irritation and congestion in your nose or some drainage.  This is from the oxygen used during your procedure.  There is no need for concern and it should clear up in a day or so.  SYMPTOMS TO REPORT IMMEDIATELY:   Following lower endoscopy (colonoscopy or flexible sigmoidoscopy):  Excessive amounts of blood in the stool  Significant tenderness or worsening of abdominal pains  Swelling of the abdomen that is new, acute  Fever of 100F or higher   Following upper endoscopy (EGD)  Vomiting of blood or coffee ground material  New chest pain or pain under the shoulder blades  Painful or persistently difficult swallowing  New shortness of breath  Fever of 100F or higher  Black, tarry-looking stools  For urgent or emergent issues, a  gastroenterologist can be reached at any hour by calling [phone]. Do not use MyChart messaging for urgent concerns.    DIET:  We do recommend a small meal at first, but then you may proceed to your regular diet.  Drink plenty of fluids but you should avoid alcoholic beverages for 24 hours.  Try to increase the fiber in your diet, and drink plenty of water.  ACTIVITY:  You should plan to take it easy for the rest of today and you should NOT DRIVE or use heavy machinery until tomorrow (because of the sedation medicines used during the test).    FOLLOW UP: Our staff will call the number listed on your records 48-72 hours following your procedure to check on you and address any questions or concerns that you may have regarding the information given to you following your procedure. If we do not reach you, we will leave a message.  We will attempt to reach you two times.  During this call, we will ask if you have developed any symptoms of COVID 19. If you develop any symptoms (ie: fever, flu-like symptoms, shortness of breath, cough etc.) before then, please call [phone].  If you test positive for Covid 19 in the 2 weeks post procedure, please call and report this information to [place].    If any biopsies were taken you will be contacted by phone or by letter within the next 1-3 weeks.  Please call us at [phone]  [phone] if you have not heard about the biopsies in 3 weeks.    SIGNATURES/CONFIDENTIALITY: You and/or your care partner have signed paperwork which will be entered into your electronic medical record.  These signatures attest to the fact that that the information above on your After Visit Summary has been reviewed and is understood.  Full responsibility of the confidentiality of this discharge information lies with you and/or your care-partner.

## 2019-10-12 NOTE — Progress Notes (Signed)
PT taken to PACU. Monitors in place. VSS. Report given to RN. 

## 2019-10-12 NOTE — Op Note (Signed)
[facility] Patient Name: Mario [name] Procedure Date: 10/12/2019 1:24 PM MRN: [id number] Endoscopist: Mallie Mussel L. Loletha Carrow , MD Age: 79 Referring MD:  Date of Birth: 1940/04/19 Gender: Male Account #: [id number] Procedure:                Colonoscopy Indications:              Surveillance: Personal history of adenomatous                            polyps on last colonoscopy > 5 years ago (tubular                            adenoma 2013) Medicines:                Monitored Anesthesia Care Procedure:                Pre-Anesthesia Assessment:                           - Prior to the procedure, a History and Physical                            was performed, and patient medications and                            allergies were reviewed. The patient's tolerance of                            previous anesthesia was also reviewed. The risks                            and benefits of the procedure and the sedation                            options and risks were discussed with the patient.                            All questions were answered, and informed consent                            was obtained. Prior Anticoagulants: The patient has                            taken Eliquis (apixaban), last dose was 2 days                            prior to procedure. ASA Grade Assessment: III - A                            patient with severe systemic disease. After                            reviewing the risks and benefits, the patient was  deemed in satisfactory condition to undergo the                            procedure.                           After obtaining informed consent, the colonoscope                            was passed under direct vision. Throughout the                            procedure, the patient's blood pressure, pulse, and                            oxygen saturations were monitored continuously. The                            Colonoscope  was introduced through the anus and                            advanced to the the cecum, identified by                            appendiceal orifice and ileocecal valve. The                            colonoscopy was performed without difficulty. The                            patient tolerated the procedure well. The quality                            of the bowel preparation was good. The ileocecal                            valve, appendiceal orifice, and rectum were                            photographed. The bowel preparation used was                            Miralax. Scope In: 1:47:29 PM Scope Out: 2:02:36 PM Scope Withdrawal Time: 0 hours 9 minutes 56 seconds  Total Procedure Duration: 0 hours 15 minutes 7 seconds  Findings:                 The perianal and digital rectal examinations were                            normal.                           Multiple diverticula were found in the left colon  and right colon.                           Internal hemorrhoids were found. The hemorrhoids                            were small.                           The exam was otherwise without abnormality on                            direct and retroflexion views. Complications:            No immediate complications. Estimated Blood Loss:     Estimated blood loss: none. Impression:               - Diverticulosis in the left colon and in the right                            colon.                           - Internal hemorrhoids.                           - The examination was otherwise normal on direct                            and retroflexion views.                           - No specimens collected. Recommendation:           - Patient has a contact number available for                            emergencies. The signs and symptoms of potential                            delayed complications were discussed with the                            patient.  Return to normal activities tomorrow.                            Written discharge instructions were provided to the                            patient.                           - Resume previous diet.                           - Resume Eliquis (apixaban) at prior dose today.                           -  Based on current guidelines and age, no repeat                            screening/surveillance colonoscopy recommended. Roxene Alviar L. Loletha Carrow, MD 10/12/2019 2:11:59 PM This report has been signed electronically.

## 2019-10-12 NOTE — Op Note (Signed)
[facility] Patient Name: Mario [name] Procedure Date: 10/12/2019 1:25 PM MRN: [id number] Endoscopist: Mallie Mussel L. Loletha Carrow , MD Age: 79 Referring MD:  Date of Birth: 04/06/1940 Gender: Male Account #: [id number] Procedure:                Upper GI endoscopy Indications:              Melena (self-limited, occured while taking NSAIDs -                            CBC normal last month) Medicines:                Monitored Anesthesia Care Procedure:                Pre-Anesthesia Assessment:                           - Prior to the procedure, a History and Physical                            was performed, and patient medications and                            allergies were reviewed. The patient's tolerance of                            previous anesthesia was also reviewed. The risks                            and benefits of the procedure and the sedation                            options and risks were discussed with the patient.                            All questions were answered, and informed consent                            was obtained. Prior Anticoagulants: The patient has                            taken Eliquis (apixaban), last dose was 2 days                            prior to procedure. ASA Grade Assessment: III - A                            patient with severe systemic disease. After                            reviewing the risks and benefits, the patient was                            deemed in satisfactory condition to undergo the  procedure.                           After obtaining informed consent, the endoscope was                            passed under direct vision. Throughout the                            procedure, the patient's blood pressure, pulse, and                            oxygen saturations were monitored continuously. The                            Endoscope was introduced through the mouth, and                             advanced to the second part of duodenum. The upper                            GI endoscopy was accomplished without difficulty.                            The patient tolerated the procedure well. Scope In: Scope Out: Findings:                 The larynx was normal.                           The esophagus was normal.                           The stomach was normal.                           The cardia and gastric fundus were normal on                            retroflexion.                           The examined duodenum was normal. Complications:            No immediate complications. Estimated Blood Loss:     Estimated blood loss: none. Impression:               - Normal larynx.                           - Normal esophagus.                           - Normal stomach.                           - Normal examined duodenum.                           -  No specimens collected.                           No source of bleeding seen - if there was an UGI                            source, it has healed. Recommendation:           - Patient has a contact number available for                            emergencies. The signs and symptoms of potential                            delayed complications were discussed with the                            patient. Return to normal activities tomorrow.                            Written discharge instructions were provided to the                            patient.                           - Resume previous diet.                           - Resume Eliquis (apixaban) at prior dose today.                           - See the other procedure note for documentation of                            additional recommendations. Maziah Keeling L. Loletha Carrow, MD 10/12/2019 2:08:53 PM This report has been signed electronically.

## 2019-10-12 NOTE — Progress Notes (Signed)
History reviewed today  VS SF

## 2019-10-14 ENCOUNTER — Telehealth: Payer: Self-pay

## 2019-10-14 NOTE — Telephone Encounter (Signed)
  Follow up Call-  Call back number 10/12/2019 02/18/2017  Post procedure Call Back phone  # [phone]  Permission to leave phone message Yes Yes  Some recent data might be hidden     Patient questions:  Do you have a fever, pain , or abdominal swelling? No. Pain Score  0 *  Have you tolerated food without any problems? Yes.    Have you been able to return to your normal activities? Yes.    Do you have any questions about your discharge instructions: Diet   No. Medications  No. Follow up visit  No.  Do you have questions or concerns about your Care? No.  Actions: * If pain score is 4 or above: No action needed, pain <4.  1. Have you developed a fever since your procedure? no  2.   Have you had an respiratory symptoms (SOB or cough) since your procedure? no  3.   Have you tested positive for COVID 19 since your procedure no  4.   Have you had any family members/close contacts diagnosed with the COVID 19 since your procedure?  no   If yes to any of these questions please route to Joylene Shep, RN and Joella Prince, RN

## 2019-10-29 ENCOUNTER — Other Ambulatory Visit: Payer: Self-pay

## 2019-10-29 ENCOUNTER — Ambulatory Visit (INDEPENDENT_AMBULATORY_CARE_PROVIDER_SITE_OTHER): Payer: Medicare HMO

## 2019-10-29 DIAGNOSIS — Z0000 Encounter for general adult medical examination without abnormal findings: Secondary | ICD-10-CM | POA: Diagnosis not present

## 2019-10-29 NOTE — Progress Notes (Addendum)
I connected with Shelbie Ammons today by telephone and verified that I am speaking with the correct person using two identifiers. Location patient: home Location provider: work Persons participating in the virtual visit: Tico Crotteau and Lisette Abu, LPN.   I discussed the limitations, risks, security and privacy concerns of performing an evaluation and management service by telephone and the availability of in person appointments. I also discussed with the patient that there may be a patient responsible charge related to this service. The patient expressed understanding and verbally consented to this telephonic visit.    Interactive audio and video telecommunications were attempted between this provider and patient, however failed, due to patient having technical difficulties OR patient did not have access to video capability.  We continued and completed visit with audio only.  Some vital signs may be absent or patient reported.   Time Spent with patient on telephone encounter: 20 minutes  Subjective:   Mario [name] is a 79 y.o. male who presents for Medicare Annual/Subsequent preventive examination.  Review of Systems    No ROS. Medicare Wellness Visit. Additional risk factors are reflected in social history. Cardiac Risk Factors include: advanced age (>70men, >1 women);hypertension;family history of premature cardiovascular disease;male gender Sleep Patterns: No sleep issues, feels rested on waking and sleeps 8 hours nightly. Home Safety/Smoke Alarms: Feels safe in home; uses home alarm. Smoke alarms in place. Living environment: 1-story homes; Lives with spouse; no needs for DME; good support system. Seat Belt Safety/Bike Helmet: Wears seat belt.    Objective:    There were no vitals filed for this visit. There is no height or weight on file to calculate BMI.  Advanced Directives 10/29/2019 03/11/2018 02/18/2017 08/28/2016 01/25/2015 12/01/2014 11/23/2014  Does Patient Have a  Medical Advance Directive? No No No No No No -  Type of Advance Directive - - - - - - -  Does patient want to make changes to medical advance directive? No - Patient declined - - - - - -  Copy of Press photographer in [place]  Would patient like information on creating a medical advance directive? - No - Patient declined - - No - patient declined information - No - patient declined information    Current Medications (verified) Outpatient Encounter Medications as of 10/29/2019  Medication Sig  . amLODipine (NORVASC) 10 MG tablet Take 1 tablet (10 mg total) by mouth daily.  . carvedilol (COREG) 3.125 MG tablet Take 1 tablet (3.125 mg total) by mouth 2 (two) times daily.  [name] ELIQUIS 5 MG TABS tablet Take 1 tablet by mouth twice daily  . flecainide (TAMBOCOR) 100 MG tablet Take 1 tablet (100 mg total) by mouth 2 (two) times daily.  [name] GLUCOSAMINE-CHONDROITIN PO Take 1 tablet by mouth daily.   . hydrALAZINE (APRESOLINE) 25 MG tablet Take 1 tablet (25 mg total) by mouth 2 (two) times daily. Please schedule annual appt for refills. [phone].  [name] losartan (COZAAR) 100 MG tablet Take 1 tablet (100 mg total) by mouth daily.  . Multiple Vitamin (MULTIVITAMIN) tablet Take 1 tablet by mouth daily.  . Saw Palmetto, Serenoa repens, (SAW PALMETTO PO) Take 1 tablet by mouth daily.  [name] testosterone cypionate (DEPOTESTOSTERONE CYPIONATE) 200 MG/ML injection Inject 50 mg into the muscle once a week.   . triamcinolone cream (KENALOG) 0.1 % APPLY CREAM EXTERNALLY TO RASH AS DIRECTED TWICE DAILY AS NEEDED (Patient not taking: Reported on 10/12/2019)   No facility-administered  encounter medications on file as of 10/29/2019.    Allergies (verified) Lisinopril   History: Past Medical History:  Diagnosis Date  . Arthritis    neck  . Dysrhythmia    afib - cardioverted 01/2015  . History of kidney stones   . Hypertension   . Melanoma ([place]) 2000   back; stage 3, squamous 1 - basal-  several  . Phlebitis   . Pneumonia   . Seasonal allergies   . Shortness of breath dyspnea    with exertion  . Tubular adenoma of colon 2013   colonoscpy in 5 years [name]   Past Surgical History:  Procedure Laterality Date  . 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 CARDIOVERSION N/A 12/01/2014   Procedure: CARDIOVERSION;  Surgeon: Troy Sine, MD;  Location: [place];  Service: Cardiovascular;  Laterality: N/A;  . CATARACT EXTRACTION  2002, 2004   bilateral  . CYSTOSCOPY    . ELBOW SURGERY Left 1984   tendon repair left arm  . INCISION AND DRAINAGE OF WOUND Left 08/28/2016   Procedure: IRRIGATION AND DEBRIDEMENT WOUND;  Surgeon: Iran Planas, MD;  Location: [place];  Service: Orthopedics;  Laterality: Left;  . LITHOTRIPSY    . melanoma removal  2000   back  . 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 TENDON REPAIR Right 2012   right  . TONSILLECTOMY    . UPPER GI ENDOSCOPY     Family History  Problem Relation Age of Onset  . Heart disease Mother   . Stroke Father   . Other Father        brain tumor-benign  . Heart disease Sister   . Heart disease Sister   . Hypertension Sister   . Colon cancer Neg Hx   . Stomach cancer Neg Hx   . Esophageal cancer Neg Hx   . Pancreatic cancer Neg Hx   . Liver disease Neg Hx    Social History   Socioeconomic History  . Marital status: Married    Spouse name: Not on file  . Number of children: 2  . Years of education: Not on file  . Highest education level: Not on file  Occupational  History  . Occupation: retired  Tobacco Use  . Smoking status: Former Smoker    Start date: 11/20/1976  . Smokeless tobacco: Never Used  . Tobacco comment: quit smoking 1986 ish  Vaping Use  . Vaping Use: Never used  Substance and Sexual Activity  . Alcohol use: Not Currently    Alcohol/week: 21.0 standard drinks    Types: 21 Standard drinks or equivalent per week  . Drug use: No  . Sexual activity: Not on file  Other Topics Concern  . Not on file  Social History Narrative  . Not on file   Social Determinants of Health   Financial Resource Strain:   . Difficulty of Paying Living Expenses: Not on file  Food Insecurity:   . Worried About Charity fundraiser in the Last Year: Not on file  .  Ran Out of Food in the Last Year: Not on file  Transportation Needs:   . Lack of Transportation (Medical): Not on file  . Lack of Transportation (Non-Medical): Not on file  Physical Activity:   . Days of Exercise per Week: Not on file  . Minutes of Exercise per Session: Not on file  Stress:   . Feeling of Stress : Not on file  Social Connections:   . Frequency of Communication with Friends and Family: Not on file  . Frequency of Social Gatherings with Friends and Family: Not on file  . Attends Religious Services: Not on file  . Active Member of Clubs or Organizations: Not on file  . Attends Archivist Meetings: Not on file  . Marital Status: Not on file    Tobacco Counseling Counseling given: Not Answered Comment: quit smoking 1986 ish   Clinical Intake:  Pre-visit preparation completed: Yes  Pain : No/denies pain     Nutritional Risks: None Diabetes: No  How often do you need to have someone help you when you read instructions, pamphlets, or other written materials from your doctor or pharmacy?: 1 - Never What is the last grade level you completed in school?: Some college  Diabetic? no  Interpreter Needed?: No  Information entered by :: Albirta Rhinehart N.  Jemima Petko, LPN   Activities of Daily Living In your present state of health, do you have any difficulty performing the following activities: 10/29/2019  Hearing? Y  Comment hearing aids  Vision? N  Difficulty concentrating or making decisions? N  Walking or climbing stairs? N  Dressing or bathing? N  Doing errands, shopping? N  Preparing Food and eating ? N  Using the Toilet? N  In the past six months, have you accidently leaked urine? N  Do you have problems with loss of bowel control? N  Managing your Medications? N  Managing your Finances? N  Housekeeping or managing your Housekeeping? N  Some recent data might be hidden    Patient Care Team: Marrian Salvage, [place] as PCP - General (Internal Medicine) Troy Sine, MD as PCP - Cardiology (Cardiology)  Indicate any recent Medical Services you may have received from other than [facility] providers in the past year (date may be approximate).     Assessment:   This is a routine wellness examination for Mario [name].  Hearing/Vision screen No exam data present  Dietary issues and exercise activities discussed: Current Exercise Habits: Home exercise routine, Type of exercise: walking;treadmill;strength training/weights;stretching, Time (Minutes): 60, Frequency (Times/Week): 5, Weekly Exercise (Minutes/Week): 300, Intensity: Moderate  Goals    . Patient Stated     To maintain my current health status by continuing to eat healthy, stay physically active and socially active.      Depression Screen PHQ 2/9 Scores 10/29/2019 03/10/2019 09/05/2014 09/02/2014 12/30/2012 06/17/2012 10/23/2011  PHQ - 2 Score 0 0 0 0 0 0 0    Fall Risk Fall Risk  10/29/2019 12/30/2012 06/17/2012  Falls in the past year? 0 No No  Number falls in past yr: 0 - -  Injury with Fall? 0 - -  Risk for fall due to : No Fall Risks - -  Follow up Falls evaluation completed - -    Any stairs in or around the home? Yes  If so, are there any without handrails? No  Home  free of loose throw rugs in walkways, pet beds, electrical cords, etc? Yes  Adequate lighting in your home to reduce  risk of falls? Yes   ASSISTIVE DEVICES UTILIZED TO PREVENT FALLS:  Life alert? No  Use of a cane, walker or w/c? No  Grab bars in the bathroom? No  Shower chair or bench in shower? No  Elevated toilet seat or a handicapped toilet? No   TIMED UP AND GO:  Was the test performed? No .  Length of time to ambulate 10 feet: 0 sec.   Gait steady and fast without use of assistive device  Cognitive Function: not indicated.        Immunizations Immunization History  Administered Date(s) Administered  . Influenza Split 10/23/2011  . Influenza,inj,Quad PF,6+ Mos 12/30/2012, 04/11/2015  . PFIZER SARS-COV-2 Vaccination 02/27/2019, 03/20/2019  . Pneumococcal Conjugate-13 12/30/2012  . Pneumococcal Polysaccharide-23 07/13/2007  . Td 11/11/1996, 07/13/2007  . Tdap 08/28/2016    TDAP status: Up to date Flu Vaccine status: Declined, Education has been provided regarding the importance of this vaccine but patient still declined. Advised may receive this vaccine at local pharmacy or Health Dept. Aware to provide a copy of the vaccination record if obtained from local pharmacy or Health Dept. Verbalized acceptance and understanding. Pneumococcal vaccine status: Up to date Covid-19 vaccine status: Completed vaccines  Qualifies for Shingles Vaccine? Yes   Zostavax completed Yes   Shingrix Completed?: No.    Education has been provided regarding the importance of this vaccine. Patient has been advised to call insurance company to determine out of pocket expense if they have not yet received this vaccine. Advised may also receive vaccine at local pharmacy or Health Dept. Verbalized acceptance and understanding.  Screening Tests Health Maintenance  Topic Date Due  . Hepatitis C Screening  Never done  . INFLUENZA VACCINE  05/11/2020 (Originally 09/12/2019)  . TETANUS/TDAP   08/29/2026  . COVID-19 Vaccine  Completed  . PNA vac Low Risk Adult  Completed    Health Maintenance  Health Maintenance Due  Topic Date Due  . Hepatitis C Screening  Never done    Colorectal cancer screening: Completed 10/12/2019. Repeat every 0 years (no repeat unless there is a problem)  Lung Cancer Screening: (Low Dose CT Chest recommended if Age 49-80 years, 30 pack-year currently smoking OR have quit w/in 15years.) does not qualify.   Lung Cancer Screening Referral: no  Additional Screening:  Hepatitis C Screening: does qualify; Completed: no  Vision Screening: Recommended annual ophthalmology exams for early detection of glaucoma and other disorders of the eye. Is the patient up to date with their annual eye exam?  Yes  Who is the provider or what is the name of the office in which the patient attends annual eye exams? Luberta Mutter, MD If pt is not established with a provider, would they like to be referred to a provider to establish care? No .   Dental Screening: Recommended annual dental exams for proper oral hygiene  Community Resource Referral / Chronic Care Management: CRR required this visit?  No   CCM required this visit?  No      Plan:     I have personally reviewed and noted the following in the patient's chart:   . Medical and social history . Use of alcohol, tobacco or illicit drugs  . Current medications and supplements . Functional ability and status . Nutritional status . Physical activity . Advanced directives . List of other physicians . Hospitalizations, surgeries, and ER visits in previous 12 months . Vitals . Screenings to include cognitive, depression, and falls . Referrals and  appointments  In addition, I have reviewed and discussed with patient certain preventive protocols, quality metrics, and best practice recommendations. A written personalized care plan for preventive services as well as general preventive health  recommendations were provided to patient.     Sheral Flow, LPN   08/30/9104   Nurse Notes:  Patient is cogitatively intact. There were no vitals filed for this visit. There is no height or weight on file to calculate BMI. Patient stated that she has no issues with gait or balance; does not use any assistive devices.  Medical screening examination/treatment/procedure(s) were performed by non-physician practitioner and as supervising provider I was immediately available for consultation/collaboration.  I agree with above. Marrian Salvage, FNP

## 2019-10-29 NOTE — Patient Instructions (Addendum)
Mario [name] , Thank you for taking time to come for your Medicare Wellness Visit. I appreciate your ongoing commitment to your health goals. Please review the following plan we discussed and let me know if I can assist you in the future.   Screening recommendations/referrals: Colonoscopy: 10/12/2019 Recommended yearly ophthalmology/optometry visit for glaucoma screening and checkup Recommended yearly dental visit for hygiene and checkup  Vaccinations: Influenza vaccine: overdue (will get at next office visit) Pneumococcal vaccine: completed Tdap vaccine: 08/28/2016 Shingles vaccine: never done   Covid-19: completed  Advanced directives: Advance directive discussed with you today. Even though you declined this today please call our office should you change your mind and we can give you the proper paperwork for you to fill out.  Conditions/risks identified: Yes; Reviewed health maintenance screenings with patient today and relevant education, vaccines, and/or referrals were provided. Please continue to do your personal lifestyle choices by: daily care of teeth and gums, regular physical activity (goal should be 5 days a week for 30 minutes), eat a healthy diet, avoid tobacco and drug use, limiting any alcohol intake, taking a low-dose aspirin (if not allergic or have been advised by your provider otherwise) and taking vitamins and minerals as recommended by your provider. Continue doing brain stimulating activities (puzzles, reading, adult coloring books, staying active) to keep memory sharp. Continue to eat heart healthy diet (full of fruits, vegetables, whole grains, lean protein, water--limit salt, fat, and sugar intake) and increase physical activity as tolerated.  Next appointment: Please schedule your next Medicare Wellness Visit with your Nurse Health Advisor in 1 year.  Preventive Care 21 Years and Older, Male Preventive care refers to lifestyle choices and visits with your health care  provider that can promote health and wellness. What does preventive care include?  A yearly physical exam. This is also called an annual well check.  Dental exams once or twice a year.  Routine eye exams. Ask your health care provider how often you should have your eyes checked.  Personal lifestyle choices, including:  Daily care of your teeth and gums.  Regular physical activity.  Eating a healthy diet.  Avoiding tobacco and drug use.  Limiting alcohol use.  Practicing safe sex.  Taking low doses of aspirin every day.  Taking vitamin and mineral supplements as recommended by your health care provider. What happens during an annual well check? The services and screenings done by your health care provider during your annual well check will depend on your age, overall health, lifestyle risk factors, and family history of disease. Counseling  Your health care provider may ask you questions about your:  Alcohol use.  Tobacco use.  Drug use.  Emotional well-being.  Home and relationship well-being.  Sexual activity.  Eating habits.  History of falls.  Memory and ability to understand (cognition).  Work and work Statistician. Screening  You may have the following tests or measurements:  Height, weight, and BMI.  Blood pressure.  Lipid and cholesterol levels. These may be checked every 5 years, or more frequently if you are over 66 years old.  Skin check.  Lung cancer screening. You may have this screening every year starting at age 16 if you have a 30-pack-year history of smoking and currently smoke or have quit within the past 15 years.  Fecal occult blood test (FOBT) of the stool. You may have this test every year starting at age 64.  Flexible sigmoidoscopy or colonoscopy. You may have a sigmoidoscopy every 5 years or  a colonoscopy every 10 years starting at age 36.  Prostate cancer screening. Recommendations will vary depending on your family history and  other risks.  Hepatitis C blood test.  Hepatitis B blood test.  Sexually transmitted disease (STD) testing.  Diabetes screening. This is done by checking your blood sugar (glucose) after you have not eaten for a while (fasting). You may have this done every 1-3 years.  Abdominal aortic aneurysm (AAA) screening. You may need this if you are a current or former smoker.  Osteoporosis. You may be screened starting at age 52 if you are at high risk. Talk with your health care provider about your test results, treatment options, and if necessary, the need for more tests. Vaccines  Your health care provider may recommend certain vaccines, such as:  Influenza vaccine. This is recommended every year.  Tetanus, diphtheria, and acellular pertussis (Tdap, Td) vaccine. You may need a Td booster every 10 years.  Zoster vaccine. You may need this after age 68.  Pneumococcal 13-valent conjugate (PCV13) vaccine. One dose is recommended after age 24.  Pneumococcal polysaccharide (PPSV23) vaccine. One dose is recommended after age 52. Talk to your health care provider about which screenings and vaccines you need and how often you need them. This information is not intended to replace advice given to you by your health care provider. Make sure you discuss any questions you have with your health care provider. Document Released: 02/24/2015 Document Revised: 10/18/2015 Document Reviewed: 11/29/2014 Elsevier Interactive Patient Education  2017 [place] Prevention in the Home Falls can cause injuries. They can happen to people of all ages. There are many things you can do to make your home safe and to help prevent falls. What can I do on the outside of my home?  Regularly fix the edges of walkways and driveways and fix any cracks.  Remove anything that might make you trip as you walk through a door, such as a raised step or threshold.  Trim any bushes or trees on the path to your  home.  Use bright outdoor lighting.  Clear any walking paths of anything that might make someone trip, such as rocks or tools.  Regularly check to see if handrails are loose or broken. Make sure that both sides of any steps have handrails.  Any raised decks and porches should have guardrails on the edges.  Have any leaves, snow, or ice cleared regularly.  Use sand or salt on walking paths during winter.  Clean up any spills in your garage right away. This includes oil or grease spills. What can I do in the bathroom?  Use night lights.  Install grab bars by the toilet and in the tub and shower. Do not use towel bars as grab bars.  Use non-skid mats or decals in the tub or shower.  If you need to sit down in the shower, use a plastic, non-slip stool.  Keep the floor dry. Clean up any water that spills on the floor as soon as it happens.  Remove soap buildup in the tub or shower regularly.  Attach bath mats securely with double-sided non-slip rug tape.  Do not have throw rugs and other things on the floor that can make you trip. What can I do in the bedroom?  Use night lights.  Make sure that you have a light by your bed that is easy to reach.  Do not use any sheets or blankets that are too big for your bed.  They should not hang down onto the floor.  Have a firm chair that has side arms. You can use this for support while you get dressed.  Do not have throw rugs and other things on the floor that can make you trip. What can I do in the kitchen?  Clean up any spills right away.  Avoid walking on wet floors.  Keep items that you use a lot in easy-to-reach places.  If you need to reach something above you, use a strong step stool that has a grab bar.  Keep electrical cords out of the way.  Do not use floor polish or wax that makes floors slippery. If you must use wax, use non-skid floor wax.  Do not have throw rugs and other things on the floor that can make you  trip. What can I do with my stairs?  Do not leave any items on the stairs.  Make sure that there are handrails on both sides of the stairs and use them. Fix handrails that are broken or loose. Make sure that handrails are as long as the stairways.  Check any carpeting to make sure that it is firmly attached to the stairs. Fix any carpet that is loose or worn.  Avoid having throw rugs at the top or bottom of the stairs. If you do have throw rugs, attach them to the floor with carpet tape.  Make sure that you have a light switch at the top of the stairs and the bottom of the stairs. If you do not have them, ask someone to add them for you. What else can I do to help prevent falls?  Wear shoes that:  Do not have high heels.  Have rubber bottoms.  Are comfortable and fit you well.  Are closed at the toe. Do not wear sandals.  If you use a stepladder:  Make sure that it is fully opened. Do not climb a closed stepladder.  Make sure that both sides of the stepladder are locked into place.  Ask someone to hold it for you, if possible.  Clearly mark and make sure that you can see:  Any grab bars or handrails.  First and last steps.  Where the edge of each step is.  Use tools that help you move around (mobility aids) if they are needed. These include:  Canes.  Walkers.  Scooters.  Crutches.  Turn on the lights when you go into a dark area. Replace any light bulbs as soon as they burn out.  Set up your furniture so you have a clear path. Avoid moving your furniture around.  If any of your floors are uneven, fix them.  If there are any pets around you, be aware of where they are.  Review your medicines with your doctor. Some medicines can make you feel dizzy. This can increase your chance of falling. Ask your doctor what other things that you can do to help prevent falls. This information is not intended to replace advice given to you by your health care provider. Make  sure you discuss any questions you have with your health care provider. Document Released: 11/24/2008 Document Revised: 07/06/2015 Document Reviewed: 03/04/2014 Elsevier Interactive Patient Education  2017 [employer].

## 2019-11-19 DIAGNOSIS — E291 Testicular hypofunction: Secondary | ICD-10-CM | POA: Diagnosis not present

## 2019-11-26 DIAGNOSIS — E291 Testicular hypofunction: Secondary | ICD-10-CM | POA: Diagnosis not present

## 2019-11-30 ENCOUNTER — Telehealth (INDEPENDENT_AMBULATORY_CARE_PROVIDER_SITE_OTHER): Payer: Medicare HMO | Admitting: Family

## 2019-11-30 DIAGNOSIS — J019 Acute sinusitis, unspecified: Secondary | ICD-10-CM

## 2019-11-30 MED ORDER — AMOXICILLIN-POT CLAVULANATE 875-125 MG PO TABS: 1.0000 | ORAL_TABLET | Freq: Two times a day (BID) | ORAL | 0 refills | Status: AC

## 2019-11-30 NOTE — Progress Notes (Signed)
Mario [name] is a 79 y.o. male with the following history as recorded in Epic[employer]:  Patient Active Problem List   Diagnosis Date Noted  . Atypical chest pain   . Near syncope   . Swelling of both ankles 07/02/2015  . 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 OSA on CPAP 01/12/2015  . Anticoagulation adequate 11/03/2014  . Atrial fibrillation ([place]) 09/29/2014  . History of colonic polyps 09/20/2014  . 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 Stricture and stenosis of esophagus 06/20/2008  . 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 HYPERTENSION, BENIGN 05/06/2006  . ELEVATED PROSTATE SPECIFIC ANTIGEN 05/06/2006  . Melanoma ([place]) 04/10/2006  . CATARACT 04/10/2006  . Diverticulitis of colon 04/10/2006  . NEPHROLITHIASIS 04/10/2006    Current Outpatient Medications  Medication Sig Dispense Refill  . amLODipine (NORVASC) 10 MG tablet Take 1 tablet (10 mg total) by mouth daily. 90 tablet 3  . carvedilol (COREG) 3.125 MG tablet Take 1 tablet (3.125 mg total) by mouth 2 (two) times daily. 180 tablet 3  . ELIQUIS 5 MG TABS tablet Take 1 tablet by mouth twice daily 180 tablet 0  . flecainide (TAMBOCOR) 100 MG tablet Take 1 tablet (100 mg total) by mouth 2 (two) times daily. 180 tablet 3  . GLUCOSAMINE-CHONDROITIN PO Take 1 tablet by mouth daily.     . hydrALAZINE (APRESOLINE) 25 MG tablet Take 1 tablet (25 mg total) by mouth 2 (two) times daily. Please schedule annual appt for refills. [phone]. 180 tablet 3  . losartan (COZAAR) 100 MG tablet Take 1 tablet (100 mg total) by mouth daily. 90 tablet 3  . Multiple Vitamin (MULTIVITAMIN) tablet Take 1 tablet by mouth daily.    . Saw Palmetto, Serenoa repens, (SAW PALMETTO PO) Take 1 tablet by mouth daily.    [name] testosterone cypionate (DEPOTESTOSTERONE CYPIONATE) 200 MG/ML injection Inject 50 mg into the muscle once a week.     [name] amoxicillin-clavulanate (AUGMENTIN) 875-125 MG tablet Take 1 tablet by mouth 2  (two) times daily for 10 days. 20 tablet 0   No current facility-administered medications for this visit.    Allergies: Lisinopril  Past Medical History:  Diagnosis Date  . Arthritis    neck  . Dysrhythmia    afib - cardioverted 01/2015  . History of kidney stones   . Hypertension   . Melanoma ([place]) 2000   back; stage 3, squamous 1 - basal- several  . Phlebitis   . Pneumonia   . Seasonal allergies   . Shortness of breath dyspnea    with exertion  . Tubular adenoma of colon 2013   colonoscpy in 5 years [name]    Past Surgical History:  Procedure Laterality Date  . 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 CARDIOVERSION N/A 12/01/2014   Procedure: CARDIOVERSION;  Surgeon: Troy Sine, MD;  Location: [facility];  Service: Cardiovascular;  Laterality: N/A;  . CATARACT EXTRACTION  2002, 2004   bilateral  . CYSTOSCOPY    . ELBOW SURGERY Left 1984   tendon repair left arm  . INCISION AND DRAINAGE OF WOUND Left 08/28/2016   Procedure: IRRIGATION AND DEBRIDEMENT WOUND;  Surgeon: Iran Planas, MD;  Location: [facility];  Service: Orthopedics;  Laterality: Left;  . LITHOTRIPSY    . melanoma removal  2000   back  . METACARPAL OSTEOTOMY Left 08/28/2016   Procedure: revision amputation of  left 4th finger.;  Surgeon: Iran Planas, MD;  Location: [facility];  Service: Orthopedics;  Laterality: Left;  [name] MICROLARYNGOSCOPY WITH LASER N/A 01/25/2015   Procedure: MICROLARYNGOSCOPY ;  Surgeon: Melida Quitter, MD;  Location: [facility];  Service: ENT;  Laterality: N/A;  Microlaryngoscopy with 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 with CO2 laser   . TENDON REPAIR Right 2012   right  . TONSILLECTOMY    . UPPER GI ENDOSCOPY      Family History  Problem Relation Age of Onset  . Heart disease Mother   . Stroke Father   . Other Father        brain tumor-benign  . Heart disease Sister   . Heart disease Sister   . Hypertension Sister    . Colon cancer Neg Hx   . Stomach cancer Neg Hx   . Esophageal cancer Neg Hx   . Pancreatic cancer Neg Hx   . Liver disease Neg Hx     Social History   Tobacco Use  . Smoking status: Former Smoker    Start date: 11/20/1976  . Smokeless tobacco: Never Used  . Tobacco comment: quit smoking 1986 ish  Substance Use Topics  . Alcohol use: Not Currently    Alcohol/week: 21.0 standard drinks    Types: 21 Standard drinks or equivalent per week    Subjective:   I connected with Mario [name] on 11/30/19 at  9:20 AM EDT by a telephone call  and verified that I am speaking with the correct person using two identifiers.   I discussed the limitations of evaluation and management by telemedicine and the availability of in person appointments. The patient expressed understanding and agreed to proceed. Provider in office/ patient is at home; provider and patient are only 2 people on telephone call.   2-3 week history of sinus pain/ pressure; has seasonal allergies; +right sided sinus pain; using OTC Coricidin HBP with some relief;     Objective:  There were no vitals filed for this visit.  Lungs: Respirations unlabored;  Neurologic: Alert and oriented; speech intact;  Assessment:  1. Acute sinusitis, recurrence not specified, unspecified location     Plan:  Rx for Augmentin 875 mg bid x 10 days; continue OTC Coricidin HBP; follow up in office worse, no better.   Time spent 11 minutes  No follow-ups on file.  No orders of the defined types were placed in this encounter.   Requested Prescriptions   Signed Prescriptions Disp Refills  . amoxicillin-clavulanate (AUGMENTIN) 875-125 MG tablet 20 tablet 0    Sig: Take 1 tablet by mouth 2 (two) times daily for 10 days.

## 2019-12-14 DIAGNOSIS — H52203 Unspecified astigmatism, bilateral: Secondary | ICD-10-CM | POA: Diagnosis not present

## 2019-12-14 DIAGNOSIS — Z961 Presence of intraocular lens: Secondary | ICD-10-CM | POA: Diagnosis not present

## 2019-12-16 DIAGNOSIS — R69 Illness, unspecified: Secondary | ICD-10-CM | POA: Diagnosis not present

## 2019-12-22 DIAGNOSIS — R69 Illness, unspecified: Secondary | ICD-10-CM | POA: Diagnosis not present

## 2019-12-28 ENCOUNTER — Encounter: Payer: Self-pay | Admitting: Cardiovascular Disease

## 2019-12-28 ENCOUNTER — Ambulatory Visit (INDEPENDENT_AMBULATORY_CARE_PROVIDER_SITE_OTHER): Payer: Medicare HMO | Admitting: Cardiovascular Disease

## 2019-12-28 ENCOUNTER — Other Ambulatory Visit: Payer: Self-pay

## 2019-12-28 VITALS — BP 142/68 | HR 68 | Ht 72.0 in | Wt 209.2 lb

## 2019-12-28 DIAGNOSIS — I480 Paroxysmal atrial fibrillation: Secondary | ICD-10-CM | POA: Diagnosis not present

## 2019-12-28 DIAGNOSIS — G4733 Obstructive sleep apnea (adult) (pediatric): Secondary | ICD-10-CM

## 2019-12-28 DIAGNOSIS — Z0100 Encounter for examination of eyes and vision without abnormal findings: Secondary | ICD-10-CM | POA: Diagnosis not present

## 2019-12-28 DIAGNOSIS — I77810 Thoracic aortic ectasia: Secondary | ICD-10-CM

## 2019-12-28 DIAGNOSIS — I350 Nonrheumatic aortic (valve) stenosis: Secondary | ICD-10-CM

## 2019-12-28 DIAGNOSIS — Z7901 Long term (current) use of anticoagulants: Secondary | ICD-10-CM | POA: Diagnosis not present

## 2019-12-28 DIAGNOSIS — R002 Palpitations: Secondary | ICD-10-CM

## 2019-12-28 DIAGNOSIS — Z9989 Dependence on other enabling machines and devices: Secondary | ICD-10-CM

## 2019-12-28 MED ORDER — CARVEDILOL 6.25 MG PO TABS
6.2500 mg | ORAL_TABLET | Freq: Two times a day (BID) | ORAL | 1 refills | Status: DC
Start: 2019-12-28 — End: 2020-06-05

## 2019-12-28 NOTE — Patient Instructions (Signed)
Medication Instructions:  INCREASE YOUR CARVEDILOL TO 6.25 MG TWICE A DAY   *If you need a refill on your cardiac medications before your next appointment, please call your pharmacy*  Lab Work: NONE  Testing/Procedures: NONE  Follow-Up: At [facility], you and your health needs are our priority.  As part of our continuing mission to provide you with exceptional heart care, we have created designated Provider Care Teams.  These Care Teams include your primary Cardiologist (physician) and Advanced Practice Providers (APPs -  Physician Assistants and Nurse Practitioners) who all work together to provide you with the care you need, when you need it.  We recommend signing up for the patient portal called "MyChart".  Sign up information is provided on this After Visit Summary.  MyChart is used to connect with patients for Virtual Visits (Telemedicine).  Patients are able to view lab/test results, encounter notes, upcoming appointments, etc.  Non-urgent messages can be sent to your provider as well.   To learn more about what you can do with MyChart, go to [email or web address].    Your next appointment:   6 month(s) You will receive a reminder letter in the mail two months in advance. If you don't receive a letter, please call our office to schedule the follow-up appointment.  The format for your next appointment:   In Person  Provider:   DR Claiborne Billings

## 2019-12-28 NOTE — Progress Notes (Signed)
Patient ID: Mario [name], male   DOB: [birth year], 79 y.o.   MRN: [id number]     Primary M.D.: Dr. Mallie Mussel   HPI:  Mario [name] is a 79 y.o. male who presents for a 7 month follow-up cardiology evaluation.  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  He underwent endoscopy by Dr. Erskine Emery and was noted to have an irregular heart rhythm with  sinus rhythm with PVCs and PACs when connected to the monitor.  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  He had undergone a cardiac catheterization in approximately 1985.  Last year  noticed chest tightness when working out and also had noticed significant increasing shortness of breath.  He has been aware of a somewhat irregular rhythm.  Over several months.  In July he was recently treated with diverticulitis with Cipro and Flagyl and had noticed increased palpitations at that time.  When I saw for initial evaluation on 09/27/2014 he was in atrial fibrillation which he was completely unaware of.  On further questioning, he omitted that he snores very loudly and has had significant episodes of witnessed apnea.  He can fall asleep during the day at any time if circumstances permit.  He has a history of hypertension.  Recently he had been taking atenolol 150 mg in the morning had taken testosterone injections once a week.  I changed his atenolol to 100 mg in the morning and 50 mg grams at night.  He was significantly hypertensive and I added amlodipine 5 mg to his medical regimen.  I scheduled him for a nuclear perfusion study which was done in 10/07/2014.  There were no ECG changes.  There was a small defect of mild severity in the basal inferior wall.  It was felt most likely this was diaphragmatic attenuation.  Ejection fraction was 59%.  An echo Doppler study revealed an EF of  50-55% and raise the possibility of mild posterior basal hypokinesis.  He had mild AR and mild MR.  He was started on eliquis at his office visit.  On follow-up evaluation, his blood pressure was still elevated.  I increased his amlodipine to 7.5 mg in the morning and also started him on losartan 50 mg daily.  He underwent a sleep study which confirm my suspicion for obstructive sleep apnea.  Overall sleep apnea was mild with an AHI of 12.3 , but events were moderate with supine sleep with an AHI of 22.8 per hour. He has significant oxygen desaturation to a nadir of 81% and on the initial study had reduced sleep efficiency at 61.8% and evidence for loud snoring.  He subsequently underwent a CPAP titration trial and he was titrated up to 8 cm water pressure with excellent result.  He underwent successful cardioversion for his atrial fibrillation  On 12/01/2014. He admits to one brief episode following the cardioversion while sleeping before CPAP therapy was initiated and this had reverted back to normal rhythm in the morning when he awakened  CPAP was initiated on November 14, 2014. He has a ResMed Airfit P10 nasalpillow mask, medium size. A download was reviewed from 12/12/2014 through 01/10/2015. His compliance is excellent at 100% of usage.  He is averaging 8 hours and 24 minutes.  His AHI was still mildly elevated at 6.5, but  significant improved initially.  There is no significant leak.  He had undergone biopsy of his laryngeal nodule, which was negative.  When I saw him in January 2017, he complained of several days of being back in atrial fibrillation which seem to be precipitated after prolonged sneezing spell.  When I saw him, with his recurrent AF and normal LV function.  I recommended initiation of flecanide at 50 mg twice a day.  He was advised to reduce his atenolol from 75 g twice a day to 50 mg twice a day for 2 days and on the third day to reduce his atenolol to 25 g twice a day.  After 2  doses of flecanide he felt that his heart rhythm had again stabilized.  A CPAP download  from January 24, 2015 through 02/19/2015 demonstrated 100% compliance.  He averaged 9 hours of sleep per night.  He is set at an auto mode and his 95th percentile pressure was 13.7 with a maximum 16.9.  AHI was 7.9.  Of note, one week ago when I saw the patient.  We discussed turning off his ramp since at the beginning of his sleep.  He was having some difficulty getting adequate air. I rechecked a download for the past week when the has been off, AHI was slightly improved at 5.8/hr.  He feels well.  He has more energy.  He is exercising regularly.  He believes his sleep is good quality.  He denies residual daytime sleepiness.  In April 2016 he had been under increased mental stress and felt that  he went back into atrial fibrillation for approximately 12 hours, but then ultimately his rhythm stabilized.  When I saw him 6 weeks ago, he was in sinus rhythm.  At that time I increased his flecainide to 75 mg twice a day.  I also scheduled him for routine treadmill test to make certain he did not have any flecanide induced proarrhythmia.  This did not demonstrate any exercise-induced arrhythmia.  He was noted to have upsloping ST segment depression of approximately 1 mm with rapid resolution in less than 1 minute in early recovery.  Previously, his nuclear perfusion study did not show any ischemia.  He feels that his mental stress has improved since his daughter seems to be reconciling with her husband and is no longer planning to go through a divorce.  He denies any chest pain or shortness of breath.    When I saw him in October 2018.  He was unaware of any recurrent episodes of atrial fibrillation.  However, at times he had experienced an occasional skipped beat.   He was cutting wood and accidentally cut off the distal aspect of his fourth finger of his left hand several months ago.  He also had been taking testosterone  prescribed by his urologist, but recently his hemoglobin had increased and he reduced his testosterone dose in half.  He has continued to be on flecainide 75 mg twice a day in addition to eliquis 5 mg twice a day for anticoagulation.  He also has been on amlodipine 7.5 mg, and losartan 100 mg in addition to atenolol 6.25 mg twice a day for hypertension.  He continues to use CPAP and cannot sleep without it.  In his words "a life changor."  His CPAP set up day was 03/04/2016.  A download was obtained in the office today from April 9 through 06/18/2017.  He is 100% compliant in sleeping 8 hours and 13 minutes.  He  has a ResMed AirSense 10 AutoSet unit with a minimum pressure at 8 and maximum of 20.  95th percentile pressure was 12.6 with a maximum average pressure at 14.6.  AHI was 3.2 but there were days of significant leak.  He just recently got a new mask.  He admits to being under increased stress.  His dad is been in hospice.   His wife' dad unfortunately died on 02-Oct-2017 at age 74, 1 month prior to turning 34. This resulted in increased stress to [name].  His blood pressure was noted to be somewhat labile during this.[name]  He is still working out at least 3 days/week lifting weights.  He continues to use CPAP with 100% compliance.  He states blood pressure typically has been in the 140s.  He needs to transient ankle edema particularly if he stands up for long duration while singing in the choir.  He is unaware of any breakthrough atrial fibrillation or rhythm disturbance.    I  saw him in December 2019 at which time he was maintaining sinus rhythm, was 1% compliant with CPAP therapy.  QTc interval was stable.  On March 11, 2018 He developed an episode of lightheadedness and recurrent atrial fibrillation and was evaluated in the emergency room.  His heart rate was not fast and he was found to have up to 2-second pause.  Atenolol was discontinued and flecainide was further increased to 100 mg twice a  day.  He underwent cardioversion in the emergency room with restoration of sinus rhythm.  He was subsequently seen on March 13, 2018 by Mammie Russian, PA-C and was noted to have frequent PACs and PVCs.  He was started back on low-dose beta-blocker therapy with carvedilol 3.125 mg twice a day.  When seen several weeks later on March 31, 2018 heart rate was stable.  ECG showed sinus rhythm and QTc interval was normal at 393 ms.  However hydralazine was added to his regimen for mild blood pressure elevation.  Since he was last seen by Almyra Deforest, PAC, he has felt well.  He has continued to be very active working out at [employer] and now that the gym is closed due to the curve at 19 pandemic, he has been active building a deck at his house.  He lives outside [place] on a 12 acre plot of land.   I last evaluated hum in a telemedicine vsist on 05/12/2018.  He was unaware of any recurrent atrial fibrillation.  He denies any anginal type chest pain symptoms.  He continues to use CPAP with 100% compliance.  When asked how his blood pressures have been running he states typically his blood pressure runs in the 1 20-1 30 range with diastolics in the 42A to 83M.  He underwent a repeat echo Doppler study on April 30, 2018.  This shows normal systolic function with an EF of 60 to 65%.  There is mild to moderate biatrial enlargement.  There is evidence for mild aortic stenosis with a mean gradient of 10 and peak gradient of 18.  He had mild aortic root dilatation at 41 mm.    Since my last evaluation, [name] has been back to the gym and  has been working out with significant weights. He had noticed increased heart rates after he had taken his first and second vaccination with PVCs. He denies any chest pain. He continues to be on amlodipine 10 mg, carvedilol 3.125 mg twice a day, flecainide 100 mg  twice a day, as well as losartan 100 mg daily. He is unaware of recurrent atrial fibrillation. He continues to use CPAP with  excellent compliance. I obtained a download in the office today from October 1 through December 27, 2019 with average CPAP use is 9 hours and 17 minutes. He has 100% compliant. His CPAP is set at a range of 8 to 20 cm and his AHI is 2.2. 95th percentile pressure is 12.4 with a maximum average pressure of 15.1. He presents for evaluation.   Past Medical History:  Diagnosis Date  . Arthritis    neck  . Dysrhythmia    afib - cardioverted 01/2015  . History of kidney stones   . Hypertension   . Melanoma ([place]) 2000   back; stage 3, squamous 1 - basal- several  . Phlebitis   . Pneumonia   . Seasonal allergies   . Shortness of breath dyspnea    with exertion  . Tubular adenoma of colon 2013   colonoscpy in 5 years [name]    Past Surgical History:  Procedure Laterality Date  . 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 CARDIOVERSION N/A 12/01/2014   Procedure: CARDIOVERSION;  Surgeon: Troy Sine, MD;  Location: [facility];  Service: Cardiovascular;  Laterality: N/A;  . CATARACT EXTRACTION  2002, 2004   bilateral  . CYSTOSCOPY    . ELBOW SURGERY Left 1984   tendon repair left arm  . INCISION AND DRAINAGE OF WOUND Left 08/28/2016   Procedure: IRRIGATION AND DEBRIDEMENT WOUND;  Surgeon: Iran Planas, MD;  Location: [facility];  Service: Orthopedics;  Laterality: Left;  . LITHOTRIPSY    . melanoma removal  2000   back  . METACARPAL OSTEOTOMY Left 08/28/2016   Procedure: 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.;  Surgeon: Iran Planas, MD;  Location: [facility];  Service: Orthopedics;  Laterality: Left;  [name] MICROLARYNGOSCOPY WITH LASER N/A 01/25/2015   Procedure: MICROLARYNGOSCOPY ;  Surgeon: Melida Quitter, MD;  Location: [facility];  Service: ENT;  Laterality: N/A;  Microlaryngoscopy with 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 with CO2 laser   . TENDON REPAIR Right 2012   right  . TONSILLECTOMY    . UPPER GI ENDOSCOPY       Allergies  Allergen Reactions  . Lisinopril Other (See Comments)    REACTION: severe GI upset requiring EGD--temporally related to lisinopril--tolerated micardis    Current Outpatient Medications  Medication Sig Dispense Refill  . amLODipine (NORVASC) 10 MG tablet Take 1 tablet (10 mg total) by mouth daily. 90 tablet 3  . carvedilol (COREG) 6.25 MG tablet Take 1 tablet (6.25 mg total) by mouth 2 (two) times daily. 180 tablet 1  . ELIQUIS 5 MG TABS tablet Take 1 tablet by mouth twice daily 180 tablet 0  . flecainide (TAMBOCOR) 100 MG tablet Take 1 tablet (100 mg total) by mouth 2 (two) times daily. 180 tablet 3  . GLUCOSAMINE-CHONDROITIN PO Take 1 tablet by mouth daily.     . hydrALAZINE (APRESOLINE) 25 MG tablet Take 1 tablet (25 mg total) by mouth 2 (two) times daily. Please schedule annual appt for refills. [phone]. 180 tablet 3  . losartan (COZAAR) 100 MG tablet Take 1 tablet (100 mg total) by mouth daily. 90 tablet 3  . Multiple Vitamin (MULTIVITAMIN) tablet Take 1 tablet by mouth daily.    . Saw Palmetto, Serenoa repens, (SAW PALMETTO PO) Take  1 tablet by mouth daily.    [name] testosterone cypionate (DEPOTESTOSTERONE CYPIONATE) 200 MG/ML injection Inject 50 mg into the muscle once a week.      No current facility-administered medications for this visit.    Social History   Socioeconomic History  . Marital status: Married    Spouse name: Not on file  . Number of children: 2  . Years of education: Not on file  . Highest education level: Not on file  Occupational History  . Occupation: retired  Tobacco Use  . Smoking status: Former Smoker    Start date: 11/20/1976  . Smokeless tobacco: Never Used  . Tobacco comment: quit smoking 1986 ish  Vaping Use  . Vaping Use: Never used  Substance and Sexual Activity  . Alcohol use: Not Currently    Alcohol/week: 21.0 standard drinks    Types: 21 Standard drinks or equivalent per week  . Drug use: No  . Sexual activity: Not  on file  Other Topics Concern  . Not on file  Social History Narrative  . Not on file   Social Determinants of Health   Financial Resource Strain:   . Difficulty of Paying Living Expenses: Not on file  Food Insecurity:   . Worried About Charity fundraiser in the Last Year: Not on file  . Ran Out of Food in the Last Year: Not on file  Transportation Needs:   . Lack of Transportation (Medical): Not on file  . Lack of Transportation (Non-Medical): Not on file  Physical Activity:   . Days of Exercise per Week: Not on file  . Minutes of Exercise per Session: Not on file  Stress:   . Feeling of Stress : Not on file  Social Connections:   . Frequency of Communication with Friends and Family: Not on file  . Frequency of Social Gatherings with Friends and Family: Not on file  . Attends Religious Services: Not on file  . Active Member of Clubs or Organizations: Not on file  . Attends Archivist Meetings: Not on file  . Marital Status: Not on file  Intimate Partner Violence:   . Fear of Current or Ex-Partner: Not on file  . Emotionally Abused: Not on file  . Physically Abused: Not on file  . Sexually Abused: Not on file   Additional social history is notable in that he is married for 43 years.  He has 2 children and 2 grandchildren.  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  He completed 12th grade of education.  Remotely he had smoked but quit in 1977.  He drinks 3 beers per day.  He has been exercising fairly regularly at least 4 days per week including weightlifting and cardio for~ 2 hours per session.  Family History  Problem Relation Age of Onset  . Heart disease Mother   . Stroke Father   . Other Father        brain tumor-benign  . Heart disease Sister   . Heart disease Sister   . Hypertension Sister   . Colon cancer Neg Hx   . Stomach cancer Neg Hx   . Esophageal cancer Neg Hx   . Pancreatic cancer Neg Hx   . Liver disease Neg Hx     Family history is notable that his mother died at age 54 with heart disease and had atrial fibrillation and congestive heart failure.  Father died at 7 with a brain tumor.  He  has 2 sisters and both have hypertension.   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 due to a sawing accident Hematologic/Oncologic: Negative; no easy bruising, bleeding Endocrine: Negative; no heat/cold intolerance; no diabetes Neuro: Negative; no changes in balance, headaches Skin: Negative; No rashes or skin lesions Psychiatric: Negative; No behavioral problems, depression Sleep: Positive for previously loud snoring, frequent awakenings, nonrestorative sleep, and daytime sleepiness, hypersomnolence, now on CPAP therapy with resolution of symptoms; no bruxism, restless legs, hypnogagnic hallucinations Other comprehensive 14 point system review is negative   Physical Exam BP (!) 142/68 (BP Location: Left Arm, Patient Position: Sitting)   Pulse 68   Ht 6' (1.829 m)   Wt 209 lb 3.2 oz (94.9 kg)   SpO2 95%   BMI 28.37 kg/m    Repeat blood pressure by me was 144/70  Wt Readings from Last 3 Encounters:  12/28/19 209 lb 3.2 oz (94.9 kg)  10/12/19 200 lb (90.7 kg)  09/01/19 200 lb 12.8 oz (91.1 kg)   General: Alert, oriented, no distress. Appears younger than stated age. Skin: normal turgor, no rashes, warm and dry HEENT: Normocephalic, atraumatic. Pupils equal round and reactive to light; sclera anicteric; extraocular muscles intact; Nose without nasal septal hypertrophy Mouth/Parynx  benign; Mallinpatti scale 3 Neck: No JVD, no carotid bruits; normal carotid upstroke Lungs: clear to ausculatation and percussion; no wheezing or rales Chest wall: without tenderness to palpitation Heart: PMI not displaced, RRR, s1 s2 normal, 1/6 systolic murmur, no diastolic murmur, no rubs, gallops, thrills, or heaves Abdomen: soft, nontender; no hepatosplenomehaly, BS+; abdominal aorta nontender and not dilated by palpation. Back: no CVA tenderness Pulses 2+ Musculoskeletal: full range of motion, normal strength, no joint deformities Extremities: no clubbing cyanosis or edema, Homan's sign negative  Neurologic: grossly nonfocal; Cranial nerves grossly wnl Psychologic: Normal mood and affect   ECG (independently read by me): NSR at 68;nonspecific  T changes, normal interval with QTc 414 msec  December 2019 ECG (independently read by me): Sinus bradycardia 55 bpm.  No ectopy.  Normal intervals.  May 2019 ECG (independently read by me): Bradycardia 56 bpm.  Nonspecific interventricular conduction delay.  QTc interval not 391 ms  October 2018 ECG (independently read by me): Sinus bradycardia 56 bpm.  No ectopy.  Normal intervals per  06/05/2016 ECG (independently read by me): Normal sinus rhythm at 61 bpm.  Nonspecific T changes.  QTc interval 390 ms.  October 2017 ECG (independently read by me): Sinus bradycardia 57 bpm.  Nonspecific ST changes.  QTc interval 393 ms.  06/30/2015 ECG (independently read by me): Sinus bradycardia at 56 bpm.  No significant ST-T changes.  QTc interval 389 ms.  05/16/2015 ECG (independently read by me): Sinus bradycardia 59 bpm.  QTc interval 376 ms.  No significant ST changes.  02/23/2015 ECG (independently read by me): Sinus bradycardia 59 bpm.  QTc interval 390 ms.  Nonspecific T changes.  02/17/15 ECG (independently read by me): Atrial fibrillation at 58 bpm.  Nonspecific ST changes.  QTc interval 380 ms.  01/12/2015 ECG  (independently read by me):   Normal sinus rhythm at 62 bpm. Nonspecific ST-T changes.  September  2016 ECG (independently read by me): Atrial fibrillation with a slow ventricular response in the 50s.  QTc interval 364 ms.  Prior CG (independently read by me):  Atrial fibrillation with ventricular rate at 52 bpm.  No significant ST-T change.  09/27/2014 ECG (independently read by me): Atrial fibrillation with ventricular rate at 59 bpm.  Nonspecific ST-T changes.  LABS:  BMP Latest Ref Rng & Units 03/10/2019 03/11/2018 02/09/2018  Glucose 70 - 99 mg/dL 92 98 87  BUN 6 - 23 mg/dL 24(H) 20 24  Creatinine 0.40 - 1.50 mg/dL 1.25 1.24 1.24  BUN/Creat Ratio 10 - 24 - - 19  Sodium 135 - 145 mEq/L 135 137 136  Potassium 3.5 - 5.1 mEq/L 4.6 4.2 4.7  Chloride 96 - 112 mEq/L 103 103 101  CO2 19 - 32 mEq/L '27 27 20  ' Calcium 8.4 - 10.5 mg/dL 9.8 9.3 9.9     Hepatic Function Latest Ref Rng & Units 03/10/2019 03/11/2018 06/13/2017  Total Protein 6.0 - 8.3 g/dL 6.8 6.9 6.8  Albumin 3.5 - 5.2 g/dL 4.2 3.6 4.4  AST 0 - 37 U/L '19 27 23  ' ALT 0 - 53 U/L '15 28 15  ' Alk Phosphatase 39 - 117 U/L 64 61 71  Total Bilirubin 0.2 - 1.2 mg/dL 0.6 0.9 0.8    CBC Latest Ref Rng & Units 09/01/2019 03/10/2019 03/11/2018  WBC 4.0 - 10.5 K/uL 6.1 6.5 7.0  Hemoglobin 13.0 - 17.0 g/dL 14.7 14.5 15.5  Hematocrit 39 - 52 % 42.0 42.2 47.0  Platelets 150 - 400 K/uL 208.0 216.0 189   Lab Results  Component Value Date   MCV 93.0 09/01/2019   MCV 95.2 03/10/2019   MCV 95.5 03/11/2018   Lab Results  Component Value Date   TSH 3.360 06/13/2017   Lab Results  Component Value Date   HGBA1C 4.9 05/06/2006     BNP    Component Value Date/Time   BNP 250.0 (H) 03/11/2018 1113    ProBNP No results found for: PROBNP   Lipid Panel     Component Value Date/Time   CHOL 159 03/10/2019 1130   CHOL 155 06/13/2017 0951   TRIG 137.0 03/10/2019 1130   HDL 52.50 03/10/2019 1130   HDL 45 06/13/2017 0951   CHOLHDL 3 03/10/2019 1130   VLDL 27.4  03/10/2019 1130   LDLCALC 79 03/10/2019 1130   LDLCALC 84 06/13/2017 0951    RADIOLOGY: No results found.  IMPRESSION:  1. Paroxysmal atrial fibrillation (HCC)   2. OSA on CPAP   3. Anticoagulation adequate   4. Palpitations   5. Mild aortic stenosis   6. Mild dilation of ascending aorta [facility])     ASSESSMENT AND PLAN: Mr. Mario [name] is an active young appearing 80 year old male who was found to be in atrial fibrillation in August 2016 and was started on anticoagulation.  He had undergone initial cardioversion in October 2016 and develop recurrent atrial fibrillation in January 2017 converted to sinus rhythm with institution of flecanide.  He continues to maintain sinus rhythm on flecainide 75 mg twice a day in addition to low-dose beta blocker therapy. Most recently, he has been on amlodipine 10 mg, carvedilol 3.125 mg twice a day, hydralazine 25 mg twice a day and losartan 100 mg daily for blood pressure control. Blood pressure today is mildly elevated with systolics in the low 426S. He did experience increased heart rate after both vaccinations. I have recommended slight titration of carvedilol to 6.25 mg  twice a day which will be helpful both for palpitations as well as improved blood pressure control. He continues to use CPAP with 100% compliance and has excellent daily usage averaging 9 hours and 17 minutes. His most recent download reveals an AHI of 2.2 and his 95th percentile pressure is 12.4. I have recommended slight adjustment to his minimum pressure and will increase this from 8 to 10 cm of water and he will continue up to a potential maximum if needed of 20 cm. He continues to be on Eliquis for anticoagulation and has been without awareness of recurrent atrial fibrillation. He continues to be on flecainide and his QTc interval is normal. His previous echo in March 2020 had shown EF 60 to 65%, mild LVH mild aortic stenosis with a mean gradient of 10.2 and peak gradient of 18.7 and  mild dilation of his ascending aorta at 41 mm. 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 Nevada Crane of dermatology. I will see him in 6 months for follow-up evaluation or sooner as needed.  Troy Sine, MD, [facility] 01/02/2020 8:53 AM

## 2020-01-02 ENCOUNTER — Encounter: Payer: Self-pay | Admitting: Cardiovascular Disease

## 2020-01-10 DIAGNOSIS — C44319 Basal cell carcinoma of skin of other parts of face: Secondary | ICD-10-CM | POA: Diagnosis not present

## 2020-01-10 DIAGNOSIS — Z85820 Personal history of malignant melanoma of skin: Secondary | ICD-10-CM | POA: Diagnosis not present

## 2020-01-10 DIAGNOSIS — L570 Actinic keratosis: Secondary | ICD-10-CM | POA: Diagnosis not present

## 2020-01-10 DIAGNOSIS — D485 Neoplasm of uncertain behavior of skin: Secondary | ICD-10-CM | POA: Diagnosis not present

## 2020-01-10 DIAGNOSIS — D692 Other nonthrombocytopenic purpura: Secondary | ICD-10-CM | POA: Diagnosis not present

## 2020-01-10 DIAGNOSIS — L111 Transient acantholytic dermatosis [Grover]: Secondary | ICD-10-CM | POA: Diagnosis not present

## 2020-01-10 DIAGNOSIS — L821 Other seborrheic keratosis: Secondary | ICD-10-CM | POA: Diagnosis not present

## 2020-01-10 DIAGNOSIS — D225 Melanocytic nevi of trunk: Secondary | ICD-10-CM | POA: Diagnosis not present

## 2020-01-10 DIAGNOSIS — Z85828 Personal history of other malignant neoplasm of skin: Secondary | ICD-10-CM | POA: Diagnosis not present

## 2020-01-19 DIAGNOSIS — G4733 Obstructive sleep apnea (adult) (pediatric): Secondary | ICD-10-CM | POA: Diagnosis not present

## 2020-02-14 ENCOUNTER — Telehealth: Payer: Self-pay | Admitting: Cardiovascular Disease

## 2020-02-14 NOTE — Telephone Encounter (Signed)
Patient c/o Palpitations:  High priority if patient c/o lightheadedness, shortness of breath, or chest pain  1) How long have you had palpitations/irregular HR/ Afib? Are you having the symptoms now? Patient states this has been going on for the past few days, currently feeling lightheaded and SOB.  2) Are you currently experiencing lightheadedness, SOB or CP? Yes, lightheadedness and SOB  3) Do you have a history of afib (atrial fibrillation) or irregular heart rhythm? Yes, for years.  4) Have you checked your BP or HR? (document readings if available): No.  5) Are you experiencing any other symptoms? No.

## 2020-02-14 NOTE — Telephone Encounter (Signed)
Returned the call to the patient. He stated that he has not had any afib in the last two years but has been in afib for the past 4 days. He does not know if this constant or intermittent. His heart rate while on the phone was 68.   He is currently taking: Flecainide 100 mg bid Carvedilol 6.25 mg bid Eliquis 5 mg bid  He stated that he has been somewhat dizzy with mild shortness of breath. An appointment has been made with the afib clinic tomorrow. He has been advised that if he feels worse to go to the ED. He has verbalized his understanding.

## 2020-02-15 ENCOUNTER — Ambulatory Visit (HOSPITAL_COMMUNITY)
Admission: RE | Admit: 2020-02-15 | Discharge: 2020-02-15 | Disposition: A | Discharge status: Home / Self Care | Payer: Medicare HMO | Source: Ambulatory Visit | Attending: Physician Assistant | Admitting: Physician Assistant

## 2020-02-15 ENCOUNTER — Encounter (HOSPITAL_COMMUNITY): Payer: Self-pay | Admitting: Physician Assistant

## 2020-02-15 ENCOUNTER — Other Ambulatory Visit: Payer: Self-pay

## 2020-02-15 VITALS — BP 120/68 | HR 60 | Ht 72.0 in | Wt 209.6 lb

## 2020-02-15 DIAGNOSIS — I4892 Unspecified atrial flutter: Secondary | ICD-10-CM | POA: Diagnosis not present

## 2020-02-15 DIAGNOSIS — G4733 Obstructive sleep apnea (adult) (pediatric): Secondary | ICD-10-CM | POA: Insufficient documentation

## 2020-02-15 DIAGNOSIS — Z87891 Personal history of nicotine dependence: Secondary | ICD-10-CM | POA: Diagnosis not present

## 2020-02-15 DIAGNOSIS — I10 Essential (primary) hypertension: Secondary | ICD-10-CM | POA: Insufficient documentation

## 2020-02-15 DIAGNOSIS — I480 Paroxysmal atrial fibrillation: Secondary | ICD-10-CM | POA: Insufficient documentation

## 2020-02-15 DIAGNOSIS — I700 Atherosclerosis of aorta: Secondary | ICD-10-CM | POA: Diagnosis not present

## 2020-02-15 DIAGNOSIS — I352 Nonrheumatic aortic (valve) stenosis with insufficiency: Secondary | ICD-10-CM | POA: Diagnosis not present

## 2020-02-15 DIAGNOSIS — D6869 Other thrombophilia: Secondary | ICD-10-CM | POA: Diagnosis not present

## 2020-02-15 DIAGNOSIS — Z7901 Long term (current) use of anticoagulants: Secondary | ICD-10-CM | POA: Insufficient documentation

## 2020-02-15 DIAGNOSIS — I484 Atypical atrial flutter: Secondary | ICD-10-CM | POA: Diagnosis not present

## 2020-02-15 LAB — CBC
HCT: 43.7 % (ref 39.0–52.0)
Hemoglobin: 15.2 g/dL (ref 13.0–17.0)
MCH: 32.3 pg (ref 26.0–34.0)
MCHC: 34.8 g/dL (ref 30.0–36.0)
MCV: 92.8 fL (ref 80.0–100.0)
Platelets: 187 10*3/uL (ref 150–400)
RBC: 4.71 MIL/uL (ref 4.22–5.81)
RDW: 13.3 % (ref 11.5–15.5)
WBC: 7.6 10*3/uL (ref 4.0–10.5)
nRBC: 0 % (ref 0.0–0.2)

## 2020-02-15 LAB — BASIC METABOLIC PANEL
Anion gap: 8 (ref 5–15)
BUN: 22 mg/dL (ref 8–23)
CO2: 22 mmol/L (ref 22–32)
Calcium: 9.4 mg/dL (ref 8.9–10.3)
Chloride: 104 mmol/L (ref 98–111)
Creatinine, Ser: 1.23 mg/dL (ref 0.61–1.24)
GFR, Estimated: 60 mL/min — ABNORMAL LOW (ref >60)
Glucose, Bld: 79 mg/dL (ref 70–99)
Potassium: 4.4 mmol/L (ref 3.5–5.1)
Sodium: 134 mmol/L — ABNORMAL LOW (ref 135–145)

## 2020-02-15 NOTE — Progress Notes (Signed)
Primary Care Physician: Olive Bass, FNP Primary Cardiologist: Dr Tresa Endo Primary Electrophysiologist: none Referring Physician: Ulyses Southward triage   Mario [name] is a 80 y.o. male with a history of OSA, HTN, and atrial fibrillation who presents for consultation in the [facility].  The patient was initially diagnosed with atrial fibrillation 2016. He has been maintained on flecainide and Eliquis for a CHADS2VASC score of 3. He was in his usual state of health until about 4 days ago when he started having symptoms of SOB and lightheadedness. ECG today shows rate controlled atrial flutter. He does report he has been pushing very hard recently to get ready for a weight lifting competition. He denies any bleeding issues on anticoagulation.   Today, he denies symptoms of chest pain, orthopnea, PND, lower extremity edema, presyncope, syncope, bleeding, or neurologic sequela. The patient is tolerating medications without difficulties and is otherwise without complaint today.    Atrial Fibrillation Risk Factors:  he does have symptoms or diagnosis of sleep apnea. he is compliant with CPAP therapy. he does not have a history of rheumatic fever. he does not have a history of alcohol use.   he has a BMI of Body mass index is 28.43 kg/m.[name] Filed Weights   02/15/20 0903  Weight: 95.1 kg    Family History  Problem Relation Age of Onset  . Heart disease Mother   . Stroke Father   . Other Father        brain tumor-benign  . Heart disease Sister   . Heart disease Sister   . Hypertension Sister   . Colon cancer Neg Hx   . Stomach cancer Neg Hx   . Esophageal cancer Neg Hx   . Pancreatic cancer Neg Hx   . Liver disease Neg Hx      Atrial Fibrillation Management history:  Previous antiarrhythmic drugs: flecainide Previous cardioversions: 11/2014 Previous ablations: none CHADS2VASC score: 3 Anticoagulation history: Eliquis   Past Medical History:   Diagnosis Date  . Arthritis    neck  . Dysrhythmia    afib - cardioverted 01/2015  . History of kidney stones   . Hypertension   . Melanoma (HCC) 2000   back; stage 3, squamous 1 - basal- several  . Phlebitis   . Pneumonia   . Seasonal allergies   . Shortness of breath dyspnea    with exertion  . Tubular adenoma of colon 2013   colonoscpy in 5 years Dr Arlyce Dice   Past Surgical History:  Procedure Laterality Date  . 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 CARDIOVERSION N/A 12/01/2014   Procedure: CARDIOVERSION;  Surgeon: Lennette Bihari, MD;  Location: [facility] ENDOSCOPY;  Service: Cardiovascular;  Laterality: N/A;  . CATARACT EXTRACTION  2002, 2004   bilateral  . CYSTOSCOPY    . ELBOW SURGERY Left 1984   tendon repair left arm  . INCISION AND DRAINAGE OF WOUND Left 08/28/2016   Procedure: IRRIGATION AND DEBRIDEMENT WOUND;  Surgeon: Bradly Bienenstock, MD;  Location: [facility] OR;  Service: Orthopedics;  Laterality: Left;  . LITHOTRIPSY    . melanoma removal  2000   back  . 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 TENDON REPAIR Right 2012   right  . TONSILLECTOMY    . UPPER GI ENDOSCOPY      Current Outpatient Medications  Medication Sig Dispense Refill  . amLODipine (NORVASC) 10 MG tablet Take 1 tablet (10 mg total) by mouth daily. 90 tablet 3  . carvedilol (COREG) 6.25 MG tablet Take 1 tablet (6.25 mg total) by mouth 2 (two) times daily. 180 tablet 1  . ELIQUIS 5 MG TABS tablet Take 1 tablet by mouth twice daily 180 tablet 0  . flecainide (TAMBOCOR)  100 MG tablet Take 1 tablet (100 mg total) by mouth 2 (two) times daily. 180 tablet 3  . GLUCOSAMINE-CHONDROITIN PO Take 1 tablet by mouth daily.     . hydrALAZINE (APRESOLINE) 25 MG tablet Take 1 tablet (25 mg total) by mouth 2 (two) times daily. Please schedule annual appt for refills. [phone]. 180 tablet 3  . losartan (COZAAR) 100 MG tablet Take 1 tablet (100 mg total) by mouth daily. 90 tablet 3  . Multiple Vitamin (MULTIVITAMIN) tablet Take 1 tablet by mouth daily.    . Saw Palmetto, Serenoa repens, (SAW PALMETTO PO) Take 1 tablet by mouth daily.    [name] testosterone cypionate (DEPOTESTOSTERONE CYPIONATE) 200 MG/ML injection Inject 50 mg into the muscle once a week.     . triamcinolone (KENALOG) 0.1 % Apply topically 2 (two) times daily as needed.     No current facility-administered medications for this encounter.    Allergies  Allergen Reactions  . Lisinopril Other (See Comments)    REACTION: severe GI upset requiring EGD--temporally related to lisinopril--tolerated micardis    Social History   Socioeconomic History  . Marital status: Married    Spouse name: Not on file  . Number of children: 2  . Years of education: Not on file  . Highest education level: Not on file  Occupational History  . Occupation: retired  Tobacco Use  . Smoking status: Former Smoker    Start date: 11/20/1976  . Smokeless tobacco: Never Used  . Tobacco comment: quit smoking 1986 ish  Vaping Use  . Vaping Use: Never used  Substance and Sexual Activity  . Alcohol use: Yes    Alcohol/week: 21.0 standard drinks    Types: 21 Standard drinks or equivalent per week  . Drug use: No  . Sexual activity: Not on file  Other Topics Concern  . Not on file  Social History Narrative  . Not on file   Social Determinants of Health   Financial Resource Strain: Not on file  Food Insecurity: Not on file  Transportation Needs: Not on file  Physical Activity: Not on file  Stress: Not on file  Social  Connections: Not on file  Intimate Partner Violence: Not on file     ROS- All systems are reviewed and negative except as per the HPI above.  Physical Exam: Vitals:   02/15/20 0903  BP: 120/68  Pulse: 60  Weight: 95.1 kg  Height: 6' (1.829 m)    GEN- The patient is well appearing elderly male, alert and oriented x 3 today.   Head- normocephalic, atraumatic Eyes-  Sclera clear, conjunctiva pink Ears- hearing intact Oropharynx- clear Neck- supple  Lungs- Clear to ausculation bilaterally, normal work of breathing Heart- Regular rate and rhythm, no murmurs, rubs or gallops  GI- soft, NT, ND, + BS  Extremities- no clubbing, cyanosis, or edema MS- no significant deformity or atrophy Skin- no rash or lesion Psych- euthymic mood, full affect Neuro- strength and sensation are intact  Wt Readings from Last 3 Encounters:  02/15/20 95.1 kg  12/28/19 94.9 kg  10/12/19 90.7 kg    EKG today demonstrates  Atypical atrial flutter with 4:1 block Vent. rate 60 BPM QRS duration 104 ms QT/QTc 376/376 ms  Echo 04/30/18 demonstrated  1. The left ventricle has normal systolic function with an ejection  fraction of 60-65%. The cavity size was normal. There is mild concentric  left ventricular hypertrophy. Left ventricular diastolic Doppler  parameters are indeterminate.  2. The right ventricle has normal systolic function. The cavity was  normal. There is no increase in right ventricular wall thickness.  3. Left atrial size was mild-moderately dilated.  4. The aortic valve is tricuspid Mild thickening of the aortic valve Mild  calcification of the aortic valve. Aortic valve regurgitation is mild to  moderate by color flow Doppler. mild stenosis of the aortic valve.  5. There is mild to moderate dilatation of the ascending aorta measuring  41 mm.   SUMMARY    Normal LV EF. Mildly thickened, mildly calcified aortic valve with  mild-moderate central AR and very mild  stenosis.  Epic records are reviewed at length today  CHA2DS2-VASc Score = 3  The patient's score is based upon: CHF History: No HTN History: Yes Diabetes History: No Stroke History: No Vascular Disease History: No Age Score: 2 Gender Score: 0      ASSESSMENT AND PLAN: 1. Paroxysmal Atrial Fibrillation/atrial flutter The patient's CHA2DS2-VASc score is 3, indicating a 3.2% annual risk of stroke.   We discussed therapeutic options today. Will arrange for DCCV. Check bmet/CBC today. Long term, patient worries about being on flecainide given his very active lifestyle. He would like to be considered for ablation. Will refer to EP to evaluate. We also discussed if he is not felt to be a good candidate, could continue flecainide or change AAD to dofetilide or amiodarone.  Continue Eliquis 5 mg BID Continue Coreg 6.25 mg BID Continue flecainide 100 mg BID  2. Secondary Hypercoagulable State (ICD10:  D68.69) The patient is at significant risk for stroke/thromboembolism based upon his CHA2DS2-VASc Score of 3.  Continue Apixaban (Eliquis).   3. Obstructive sleep apnea The importance of adequate treatment of sleep apnea was discussed today in order to improve our ability to maintain sinus rhythm long term. Patient reports compliance with CPAP therapy.  4. HTN Stable, no changes today.   Follow up with EP following DCCV for ablation consideration.    [facility] [address] [place], [place] [address] [phone] 02/15/2020 11:04 AM

## 2020-02-15 NOTE — Patient Instructions (Addendum)
Cardioversion scheduled for Tuesday, January 11th  - Arrive at the [facility] and go to admitting at 730AM  - Do not eat or drink anything after midnight the night prior to your procedure.  - Take all your morning medication (except diabetic medications) with a sip of water prior to arrival.  - You will not be able to drive home after your procedure.  - Do NOT miss any doses of your blood thinner - if you should miss a dose please notify our office immediately.  - If you feel as if you go back into normal rhythm prior to scheduled cardioversion, please notify our office immediately. If your procedure is canceled in the cardioversion suite you will be charged a cancellation fee.  Scheduling will contact you to set up appointment for follow up with EP to discuss afib ablation

## 2020-02-16 ENCOUNTER — Encounter: Payer: Self-pay | Admitting: Internal Medicine

## 2020-02-16 ENCOUNTER — Ambulatory Visit: Payer: Medicare HMO | Admitting: Internal Medicine

## 2020-02-16 ENCOUNTER — Ambulatory Visit: Payer: Medicare HMO | Admitting: Cardiovascular Disease

## 2020-02-16 ENCOUNTER — Other Ambulatory Visit: Payer: Self-pay

## 2020-02-16 ENCOUNTER — Encounter: Payer: Self-pay | Admitting: *Deleted

## 2020-02-16 VITALS — BP 110/68 | HR 58 | Ht 72.0 in | Wt 210.0 lb

## 2020-02-16 DIAGNOSIS — I4891 Unspecified atrial fibrillation: Secondary | ICD-10-CM

## 2020-02-16 DIAGNOSIS — D6869 Other thrombophilia: Secondary | ICD-10-CM | POA: Diagnosis not present

## 2020-02-16 DIAGNOSIS — I4819 Other persistent atrial fibrillation: Secondary | ICD-10-CM | POA: Diagnosis not present

## 2020-02-16 DIAGNOSIS — I484 Atypical atrial flutter: Secondary | ICD-10-CM

## 2020-02-16 DIAGNOSIS — I350 Nonrheumatic aortic (valve) stenosis: Secondary | ICD-10-CM

## 2020-02-16 NOTE — Patient Instructions (Addendum)
Medication Instructions:  Your physician recommends that you continue on your current medications as directed. Please refer to the Current Medication list given to you today.  *If you need a refill on your cardiac medications before your next appointment, please call your pharmacy*  Lab Work: CBC, BMP  If you have labs (blood work) drawn today and your tests are completely normal, you will receive your results only by: [name] MyChart Message (if you have MyChart) OR . A paper copy in the mail If you have any lab test that is abnormal or we need to change your treatment, we will call you to review the results.  Testing/Procedures:Please schedule echo Your physician has requested that you have an echocardiogram. Echocardiography is a painless test that uses sound waves to create images of your heart. It provides your doctor with information about the size and shape of your heart and how well your heart's chambers and valves are working. This procedure takes approximately one hour. There are no restrictions for this procedure.   Follow-Up: At [facility], you and your health needs are our priority.  As part of our continuing mission to provide you with exceptional heart care, we have created designated Provider Care Teams.  These Care Teams include your primary Cardiologist (physician) and Advanced Practice Providers (APPs -  Physician Assistants and Nurse Practitioners) who all work together to provide you with the care you need, when you need it.  We recommend signing up for the patient portal called "MyChart".  Sign up information is provided on this After Visit Summary.  MyChart is used to connect with patients for Virtual Visits (Telemedicine).  Patients are able to view lab/test results, encounter notes, upcoming appointments, etc.  Non-urgent messages can be sent to your provider as well.   To learn more about what you can do with MyChart, go to [email or web address].    Other  Instructions:  Cardiac Ablation  Cardiac ablation is a procedure to stop some heart tissue from causing problems. The heart has many electrical connections. Sometimes these connections make the heart beat very fast or irregularly. Removing some problem areas can improve the heart rhythm or make it normal. What happens before the procedure?  Follow instructions from your doctor about what you cannot eat or drink.  Ask your doctor about: ? Changing or stopping your normal medicines. This is important if you take diabetes medicines or blood thinners. ? Taking medicines such as aspirin and ibuprofen. These medicines can thin your blood. Do not take these medicines before your procedure if your doctor tells you not to.  Plan to have someone take you home.  If you will be going home right after the procedure, plan to have someone with you for 24 hours. What happens during the procedure?  To lower your risk of infection: ? Your health care team will wash or sanitize their hands. ? Your skin will be washed with soap. ? Hair may be removed from your neck or groin.  An IV tube will be put into one of your veins.  You will be given a medicine to help you relax (sedative).  Skin on your neck or groin will be numbed.  A cut (incision) will be made in your neck or groin.  A needle will be put through your cut and into a vein in your neck or groin.  A tube (catheter) will be put into the needle. The tube will be moved to your heart. X-rays (fluoroscopy) will be used to  help guide the tube.  Small devices (electrodes) on the tip of the tube will send out electrical currents.  Dye may be put through the tube. This helps your surgeon see your heart.  Electrical energy will be used to scar (ablate) some heart tissue. Your surgeon may use: ? Heat (radiofrequency energy). ? Laser energy. ? Extreme cold (cryoablation).  The tube will be taken out.  Pressure will be held on your cut. This  helps stop bleeding.  A bandage (dressing) will be put on your cut. The procedure may vary. What happens after the procedure?  You will be monitored until your medicines have worn off.  Your cut will be watched for bleeding. You will need to lie still for a few hours.  Do not drive for 24 hours or as long as your doctor tells you. Summary  Cardiac ablation is a procedure to stop some heart tissue from causing problems.  Electrical energy will be used to scar (ablate) some heart tissue. This information is not intended to replace advice given to you by your health care provider. Make sure you discuss any questions you have with your health care provider. Document Revised: 01/10/2017 Document Reviewed: 12/18/2015 Elsevier Patient Education  2020 [employer].

## 2020-02-16 NOTE — Progress Notes (Signed)
Electrophysiology Office Note   Date:  02/16/2020   ID:  Mario [name], DOB 08-Aug-1940, MRN [id number]  PCP:  Olive Bass, FNP  Cardiologist:  Dr Tresa Endo Primary Electrophysiologist: Hillis Range, MD    CC: afib   History of Present Illness: Mario [name] is a 80 y.o. male who presents today for electrophysiology evaluation.   He is referred by Dr Tresa Endo and Jorja Loa for EP consultation regarding atrial fibrillation. He reports initially being diagnosed with afib in 2016.  He has failed medical therapy with flecainide.  He reports SOB and fatigue with his afib. He has OSA and uses CPAP. 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  Today, he denies symptoms of palpitations, chest pain, shortness of breath, orthopnea, PND, lower extremity edema, claudication, dizziness, presyncope, syncope, bleeding, or neurologic sequela. The patient is tolerating medications without difficulties and is otherwise without complaint today.    Past Medical History:  Diagnosis Date  . Aortic insufficiency   . Arthritis    neck  . History of kidney stones   . Hypertension   . Melanoma (HCC) 2000   back; stage 3, squamous 1 - basal- several  . OSA (obstructive sleep apnea)    uses CPAP  . Persistent atrial fibrillation (HCC)    afib - cardioverted 01/2015  . Phlebitis   . Pneumonia   . Seasonal allergies   . Tubular adenoma of colon 2013   colonoscpy in 5 years Dr Arlyce Dice   Past Surgical History:  Procedure Laterality Date  . 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 CARDIOVERSION N/A 12/01/2014   Procedure: CARDIOVERSION;  Surgeon: Lennette Bihari, MD;  Location: [facility] ENDOSCOPY;  Service: Cardiovascular;  Laterality: N/A;  . CATARACT EXTRACTION  2002, 2004   bilateral  . CYSTOSCOPY    . ELBOW SURGERY Left 1984   tendon repair left arm  . INCISION AND DRAINAGE OF WOUND Left 08/28/2016   Procedure: IRRIGATION AND  DEBRIDEMENT WOUND;  Surgeon: Bradly Bienenstock, MD;  Location: [facility] OR;  Service: Orthopedics;  Laterality: Left;  . LITHOTRIPSY    . melanoma removal  2000   back  . 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 TENDON REPAIR Right 2012   right  . TONSILLECTOMY    . UPPER GI ENDOSCOPY       Current Outpatient Medications  Medication Sig Dispense Refill  . amLODipine (NORVASC) 10 MG tablet Take 1 tablet (10 mg total) by mouth daily. 90 tablet 3  . carvedilol (COREG) 6.25 MG tablet Take 1 tablet (6.25 mg total) by mouth 2 (two) times daily. 180 tablet 1  . ELIQUIS 5 MG TABS tablet Take 1 tablet by mouth twice daily 180 tablet 0  . flecainide (TAMBOCOR) 100 MG tablet Take 1 tablet (100 mg total) by mouth 2 (two) times daily. 180 tablet 3  . GLUCOSAMINE-CHONDROITIN PO Take 1 tablet by mouth daily.     . hydrALAZINE (APRESOLINE) 25 MG tablet Take 1 tablet (25 mg total) by mouth 2 (two) times daily. Please schedule  annual appt for refills. [phone]. 180 tablet 3  . losartan (COZAAR) 100 MG tablet Take 1 tablet (100 mg total) by mouth daily. 90 tablet 3  . Multiple Vitamin (MULTIVITAMIN) tablet Take 1 tablet by mouth daily.    . Saw Palmetto, Serenoa repens, (SAW PALMETTO PO) Take 1 tablet by mouth daily.    [name] testosterone cypionate (DEPOTESTOSTERONE CYPIONATE) 200 MG/ML injection Inject 50 mg into the muscle once a week.     . triamcinolone (KENALOG) 0.1 % Apply topically 2 (two) times daily as needed.     No current facility-administered medications for this visit.    Allergies:   Lisinopril   Social History:  The  patient  reports that he has quit smoking. He started smoking about 43 years ago. He has never used smokeless tobacco. He reports current alcohol use of about 21.0 standard drinks of alcohol per week. He reports that he does not use drugs.   Family History:  The patient's  family history includes Heart disease in his mother, sister, and sister; Hypertension in his sister; Other in his father; Stroke in his father.    ROS:  Please see the history of present illness.   All other systems are personally reviewed and negative.    PHYSICAL EXAM: VS:  BP 110/68   Pulse (!) 58   Ht 6' (1.829 m)   Wt 210 lb (95.3 kg)   SpO2 96%   BMI 28.48 kg/m  , BMI Body mass index is 28.48 kg/m. GEN: Well nourished, well developed, in no acute distress HEENT: normal Neck: no JVD, carotid bruits, or masses Cardiac: iRRR Respiratory:  clear to auscultation bilaterally, normal work of breathing GI: soft, nontender, nondistended, + BS MS: no deformity or atrophy Skin: warm and dry  Neuro:  Strength and sensation are intact Psych: euthymic mood, full affect  EKG:  EKG is ordered today. The ekg ordered today is personally reviewed and shows afib   Recent Labs: 03/10/2019: ALT 15 02/15/2020: BUN 22; Creatinine, Ser 1.23; Hemoglobin 15.2; Platelets 187; Potassium 4.4; Sodium 134  personally reviewed   Lipid Panel     Component Value Date/Time   CHOL 159 03/10/2019 1130   CHOL 155 06/13/2017 0951   TRIG 137.0 03/10/2019 1130   HDL 52.50 03/10/2019 1130   HDL 45 06/13/2017 0951   CHOLHDL 3 03/10/2019 1130   VLDL 27.4 03/10/2019 1130   LDLCALC 79 03/10/2019 1130   LDLCALC 84 06/13/2017 0951   personally reviewed   Wt Readings from Last 3 Encounters:  02/16/20 210 lb (95.3 kg)  02/15/20 209 lb 9.6 oz (95.1 kg)  12/28/19 209 lb 3.2 oz (94.9 kg)      Other studies personally reviewed: Additional studies/ records that were reviewed today include: echo, AF clinic notes  Review of the above records  today demonstrates: as above  Echo 04/30/18- EF 60%, mild to moderate LA enlargement, mild to moderate AI, mild AS   ASSESSMENT AND PLAN:  1.  Persistent atrial fibrillation/ atrial flutter Yesterdays ekg is reviewed and reveals atrial flutter vs coarse afib.  Not completely typical atrial flutter but possibly isthmus dependant. The patient has symptomatic, recurrent persistent atrial fibrillation. he has failed medical therapy with flecainide. Chads2vasc score is 3.  he is anticoagulated with eliquis . Therapeutic strategies for afib including medicine and ablation were discussed in detail with the patient today. Risk, benefits, and alternatives to EP study and radiofrequency ablation for afib were also discussed in detail today. These  risks include but are not limited to stroke, bleeding, vascular damage, tamponade, perforation, damage to the esophagus, lungs, and other structures, pulmonary vein stenosis, worsening renal function, and death. The patient understands these risk and wishes to proceed.  We will therefore proceed with catheter ablation at the next available time.  Carto, ICE, anesthesia are requested for the procedure.  Will also obtain cardiac CT prior to the procedure to exclude LAA thrombus and further evaluate atrial anatomy.  2 AI/AS Update echo to evaluate AI, not severe by exam  3. HTN Stable No change required today   Risks, benefits and potential toxicities for medications prescribed and/or refilled reviewed with patient today.    Army Fossa, MD  02/16/2020 11:26 AM     [address] [place] [id number] [phone] (office) [phone] (fax)

## 2020-02-16 NOTE — H&P (View-Only) (Signed)
Electrophysiology Office Note   Date:  02/16/2020   ID:  Mario [name], DOB 08-Aug-1940, MRN [id number]  PCP:  Olive Bass, FNP  Cardiologist:  Dr Tresa Endo Primary Electrophysiologist: Hillis Range, MD    CC: afib   History of Present Illness: Mario [name] is a 80 y.o. male who presents today for electrophysiology evaluation.   He is referred by Dr Tresa Endo and Jorja Loa for EP consultation regarding atrial fibrillation. He reports initially being diagnosed with afib in 2016.  He has failed medical therapy with flecainide.  He reports SOB and fatigue with his afib. He has OSA and uses CPAP. 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  Today, he denies symptoms of palpitations, chest pain, shortness of breath, orthopnea, PND, lower extremity edema, claudication, dizziness, presyncope, syncope, bleeding, or neurologic sequela. The patient is tolerating medications without difficulties and is otherwise without complaint today.    Past Medical History:  Diagnosis Date  . Aortic insufficiency   . Arthritis    neck  . History of kidney stones   . Hypertension   . Melanoma (HCC) 2000   back; stage 3, squamous 1 - basal- several  . OSA (obstructive sleep apnea)    uses CPAP  . Persistent atrial fibrillation (HCC)    afib - cardioverted 01/2015  . Phlebitis   . Pneumonia   . Seasonal allergies   . Tubular adenoma of colon 2013   colonoscpy in 5 years Dr Arlyce Dice   Past Surgical History:  Procedure Laterality Date  . 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 CARDIOVERSION N/A 12/01/2014   Procedure: CARDIOVERSION;  Surgeon: Lennette Bihari, MD;  Location: [facility] ENDOSCOPY;  Service: Cardiovascular;  Laterality: N/A;  . CATARACT EXTRACTION  2002, 2004   bilateral  . CYSTOSCOPY    . ELBOW SURGERY Left 1984   tendon repair left arm  . INCISION AND DRAINAGE OF WOUND Left 08/28/2016   Procedure: IRRIGATION AND  DEBRIDEMENT WOUND;  Surgeon: Bradly Bienenstock, MD;  Location: [facility] OR;  Service: Orthopedics;  Laterality: Left;  . LITHOTRIPSY    . melanoma removal  2000   back  . 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 TENDON REPAIR Right 2012   right  . TONSILLECTOMY    . UPPER GI ENDOSCOPY       Current Outpatient Medications  Medication Sig Dispense Refill  . amLODipine (NORVASC) 10 MG tablet Take 1 tablet (10 mg total) by mouth daily. 90 tablet 3  . carvedilol (COREG) 6.25 MG tablet Take 1 tablet (6.25 mg total) by mouth 2 (two) times daily. 180 tablet 1  . ELIQUIS 5 MG TABS tablet Take 1 tablet by mouth twice daily 180 tablet 0  . flecainide (TAMBOCOR) 100 MG tablet Take 1 tablet (100 mg total) by mouth 2 (two) times daily. 180 tablet 3  . GLUCOSAMINE-CHONDROITIN PO Take 1 tablet by mouth daily.     . hydrALAZINE (APRESOLINE) 25 MG tablet Take 1 tablet (25 mg total) by mouth 2 (two) times daily. Please schedule  annual appt for refills. [phone]. 180 tablet 3  . losartan (COZAAR) 100 MG tablet Take 1 tablet (100 mg total) by mouth daily. 90 tablet 3  . Multiple Vitamin (MULTIVITAMIN) tablet Take 1 tablet by mouth daily.    . Saw Palmetto, Serenoa repens, (SAW PALMETTO PO) Take 1 tablet by mouth daily.    [name] testosterone cypionate (DEPOTESTOSTERONE CYPIONATE) 200 MG/ML injection Inject 50 mg into the muscle once a week.     . triamcinolone (KENALOG) 0.1 % Apply topically 2 (two) times daily as needed.     No current facility-administered medications for this visit.    Allergies:   Lisinopril   Social History:  The  patient  reports that he has quit smoking. He started smoking about 43 years ago. He has never used smokeless tobacco. He reports current alcohol use of about 21.0 standard drinks of alcohol per week. He reports that he does not use drugs.   Family History:  The patient's  family history includes Heart disease in his mother, sister, and sister; Hypertension in his sister; Other in his father; Stroke in his father.    ROS:  Please see the history of present illness.   All other systems are personally reviewed and negative.    PHYSICAL EXAM: VS:  BP 110/68   Pulse (!) 58   Ht 6' (1.829 m)   Wt 210 lb (95.3 kg)   SpO2 96%   BMI 28.48 kg/m  , BMI Body mass index is 28.48 kg/m. GEN: Well nourished, well developed, in no acute distress HEENT: normal Neck: no JVD, carotid bruits, or masses Cardiac: iRRR Respiratory:  clear to auscultation bilaterally, normal work of breathing GI: soft, nontender, nondistended, + BS MS: no deformity or atrophy Skin: warm and dry  Neuro:  Strength and sensation are intact Psych: euthymic mood, full affect  EKG:  EKG is ordered today. The ekg ordered today is personally reviewed and shows afib   Recent Labs: 03/10/2019: ALT 15 02/15/2020: BUN 22; Creatinine, Ser 1.23; Hemoglobin 15.2; Platelets 187; Potassium 4.4; Sodium 134  personally reviewed   Lipid Panel     Component Value Date/Time   CHOL 159 03/10/2019 1130   CHOL 155 06/13/2017 0951   TRIG 137.0 03/10/2019 1130   HDL 52.50 03/10/2019 1130   HDL 45 06/13/2017 0951   CHOLHDL 3 03/10/2019 1130   VLDL 27.4 03/10/2019 1130   LDLCALC 79 03/10/2019 1130   LDLCALC 84 06/13/2017 0951   personally reviewed   Wt Readings from Last 3 Encounters:  02/16/20 210 lb (95.3 kg)  02/15/20 209 lb 9.6 oz (95.1 kg)  12/28/19 209 lb 3.2 oz (94.9 kg)      Other studies personally reviewed: Additional studies/ records that were reviewed today include: echo, AF clinic notes  Review of the above records  today demonstrates: as above  Echo 04/30/18- EF 60%, mild to moderate LA enlargement, mild to moderate AI, mild AS   ASSESSMENT AND PLAN:  1.  Persistent atrial fibrillation/ atrial flutter Yesterdays ekg is reviewed and reveals atrial flutter vs coarse afib.  Not completely typical atrial flutter but possibly isthmus dependant. The patient has symptomatic, recurrent persistent atrial fibrillation. he has failed medical therapy with flecainide. Chads2vasc score is 3.  he is anticoagulated with eliquis . Therapeutic strategies for afib including medicine and ablation were discussed in detail with the patient today. Risk, benefits, and alternatives to EP study and radiofrequency ablation for afib were also discussed in detail today. These  risks include but are not limited to stroke, bleeding, vascular damage, tamponade, perforation, damage to the esophagus, lungs, and other structures, pulmonary vein stenosis, worsening renal function, and death. The patient understands these risk and wishes to proceed.  We will therefore proceed with catheter ablation at the next available time.  Carto, ICE, anesthesia are requested for the procedure.  Will also obtain cardiac CT prior to the procedure to exclude LAA thrombus and further evaluate atrial anatomy.  2 AI/AS Update echo to evaluate AI, not severe by exam  3. HTN Stable No change required today   Risks, benefits and potential toxicities for medications prescribed and/or refilled reviewed with patient today.    Army Fossa, MD  02/16/2020 11:26 AM     [place] [address] [phone] (office) [phone] (fax)

## 2020-02-16 NOTE — H&P (View-Only) (Signed)
Electrophysiology Office Note   Date:  02/16/2020   ID:  Mario [name], DOB 08-Aug-1940, MRN [id number]  PCP:  Olive Bass, FNP  Cardiologist:  Dr Tresa Endo Primary Electrophysiologist: Hillis Range, MD    CC: afib   History of Present Illness: Mario [name] is a 80 y.o. male who presents today for electrophysiology evaluation.   He is referred by Dr Tresa Endo and Jorja Loa for EP consultation regarding atrial fibrillation. He reports initially being diagnosed with afib in 2016.  He has failed medical therapy with flecainide.  He reports SOB and fatigue with his afib. He has OSA and uses CPAP. 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  Today, he denies symptoms of palpitations, chest pain, shortness of breath, orthopnea, PND, lower extremity edema, claudication, dizziness, presyncope, syncope, bleeding, or neurologic sequela. The patient is tolerating medications without difficulties and is otherwise without complaint today.    Past Medical History:  Diagnosis Date  . Aortic insufficiency   . Arthritis    neck  . History of kidney stones   . Hypertension   . Melanoma (HCC) 2000   back; stage 3, squamous 1 - basal- several  . OSA (obstructive sleep apnea)    uses CPAP  . Persistent atrial fibrillation (HCC)    afib - cardioverted 01/2015  . Phlebitis   . Pneumonia   . Seasonal allergies   . Tubular adenoma of colon 2013   colonoscpy in 5 years Dr Arlyce Dice   Past Surgical History:  Procedure Laterality Date  . 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 CARDIOVERSION N/A 12/01/2014   Procedure: CARDIOVERSION;  Surgeon: Lennette Bihari, MD;  Location: [facility] ENDOSCOPY;  Service: Cardiovascular;  Laterality: N/A;  . CATARACT EXTRACTION  2002, 2004   bilateral  . CYSTOSCOPY    . ELBOW SURGERY Left 1984   tendon repair left arm  . INCISION AND DRAINAGE OF WOUND Left 08/28/2016   Procedure: IRRIGATION AND  DEBRIDEMENT WOUND;  Surgeon: Bradly Bienenstock, MD;  Location: [facility] OR;  Service: Orthopedics;  Laterality: Left;  . LITHOTRIPSY    . melanoma removal  2000   back  . 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 TENDON REPAIR Right 2012   right  . TONSILLECTOMY    . UPPER GI ENDOSCOPY       Current Outpatient Medications  Medication Sig Dispense Refill  . amLODipine (NORVASC) 10 MG tablet Take 1 tablet (10 mg total) by mouth daily. 90 tablet 3  . carvedilol (COREG) 6.25 MG tablet Take 1 tablet (6.25 mg total) by mouth 2 (two) times daily. 180 tablet 1  . ELIQUIS 5 MG TABS tablet Take 1 tablet by mouth twice daily 180 tablet 0  . flecainide (TAMBOCOR) 100 MG tablet Take 1 tablet (100 mg total) by mouth 2 (two) times daily. 180 tablet 3  . GLUCOSAMINE-CHONDROITIN PO Take 1 tablet by mouth daily.     . hydrALAZINE (APRESOLINE) 25 MG tablet Take 1 tablet (25 mg total) by mouth 2 (two) times daily. Please schedule  annual appt for refills. [phone]. 180 tablet 3  . losartan (COZAAR) 100 MG tablet Take 1 tablet (100 mg total) by mouth daily. 90 tablet 3  . Multiple Vitamin (MULTIVITAMIN) tablet Take 1 tablet by mouth daily.    . Saw Palmetto, Serenoa repens, (SAW PALMETTO PO) Take 1 tablet by mouth daily.    [name] testosterone cypionate (DEPOTESTOSTERONE CYPIONATE) 200 MG/ML injection Inject 50 mg into the muscle once a week.     . triamcinolone (KENALOG) 0.1 % Apply topically 2 (two) times daily as needed.     No current facility-administered medications for this visit.    Allergies:   Lisinopril   Social History:  The  patient  reports that he has quit smoking. He started smoking about 43 years ago. He has never used smokeless tobacco. He reports current alcohol use of about 21.0 standard drinks of alcohol per week. He reports that he does not use drugs.   Family History:  The patient's  family history includes Heart disease in his mother, sister, and sister; Hypertension in his sister; Other in his father; Stroke in his father.    ROS:  Please see the history of present illness.   All other systems are personally reviewed and negative.    PHYSICAL EXAM: VS:  BP 110/68   Pulse (!) 58   Ht 6' (1.829 m)   Wt 210 lb (95.3 kg)   SpO2 96%   BMI 28.48 kg/m  , BMI Body mass index is 28.48 kg/m. GEN: Well nourished, well developed, in no acute distress HEENT: normal Neck: no JVD, carotid bruits, or masses Cardiac: iRRR Respiratory:  clear to auscultation bilaterally, normal work of breathing GI: soft, nontender, nondistended, + BS MS: no deformity or atrophy Skin: warm and dry  Neuro:  Strength and sensation are intact Psych: euthymic mood, full affect  EKG:  EKG is ordered today. The ekg ordered today is personally reviewed and shows afib   Recent Labs: 03/10/2019: ALT 15 02/15/2020: BUN 22; Creatinine, Ser 1.23; Hemoglobin 15.2; Platelets 187; Potassium 4.4; Sodium 134  personally reviewed   Lipid Panel     Component Value Date/Time   CHOL 159 03/10/2019 1130   CHOL 155 06/13/2017 0951   TRIG 137.0 03/10/2019 1130   HDL 52.50 03/10/2019 1130   HDL 45 06/13/2017 0951   CHOLHDL 3 03/10/2019 1130   VLDL 27.4 03/10/2019 1130   LDLCALC 79 03/10/2019 1130   LDLCALC 84 06/13/2017 0951   personally reviewed   Wt Readings from Last 3 Encounters:  02/16/20 210 lb (95.3 kg)  02/15/20 209 lb 9.6 oz (95.1 kg)  12/28/19 209 lb 3.2 oz (94.9 kg)      Other studies personally reviewed: Additional studies/ records that were reviewed today include: echo, AF clinic notes  Review of the above records  today demonstrates: as above  Echo 04/30/18- EF 60%, mild to moderate LA enlargement, mild to moderate AI, mild AS   ASSESSMENT AND PLAN:  1.  Persistent atrial fibrillation/ atrial flutter Yesterdays ekg is reviewed and reveals atrial flutter vs coarse afib.  Not completely typical atrial flutter but possibly isthmus dependant. The patient has symptomatic, recurrent persistent atrial fibrillation. he has failed medical therapy with flecainide. Chads2vasc score is 3.  he is anticoagulated with eliquis . Therapeutic strategies for afib including medicine and ablation were discussed in detail with the patient today. Risk, benefits, and alternatives to EP study and radiofrequency ablation for afib were also discussed in detail today. These  risks include but are not limited to stroke, bleeding, vascular damage, tamponade, perforation, damage to the esophagus, lungs, and other structures, pulmonary vein stenosis, worsening renal function, and death. The patient understands these risk and wishes to proceed.  We will therefore proceed with catheter ablation at the next available time.  Carto, ICE, anesthesia are requested for the procedure.  Will also obtain cardiac CT prior to the procedure to exclude LAA thrombus and further evaluate atrial anatomy.  2 AI/AS Update echo to evaluate AI, not severe by exam  3. HTN Stable No change required today   Risks, benefits and potential toxicities for medications prescribed and/or refilled reviewed with patient today.    Army Fossa, MD  02/16/2020 11:26 AM     [place] [address] [phone] (office) [phone] (fax)

## 2020-02-17 ENCOUNTER — Ambulatory Visit (HOSPITAL_COMMUNITY)
Admission: RE | Admit: 2020-02-17 | Discharge: 2020-02-17 | Disposition: A | Discharge status: Home / Self Care | Payer: Medicare HMO | Source: Ambulatory Visit | Attending: Internal Medicine | Admitting: Internal Medicine

## 2020-02-17 DIAGNOSIS — I4892 Unspecified atrial flutter: Secondary | ICD-10-CM | POA: Insufficient documentation

## 2020-02-17 DIAGNOSIS — Z87891 Personal history of nicotine dependence: Secondary | ICD-10-CM | POA: Diagnosis not present

## 2020-02-17 DIAGNOSIS — I10 Essential (primary) hypertension: Secondary | ICD-10-CM | POA: Insufficient documentation

## 2020-02-17 DIAGNOSIS — I484 Atypical atrial flutter: Secondary | ICD-10-CM

## 2020-02-17 DIAGNOSIS — R55 Syncope and collapse: Secondary | ICD-10-CM | POA: Diagnosis not present

## 2020-02-17 DIAGNOSIS — I351 Nonrheumatic aortic (valve) insufficiency: Secondary | ICD-10-CM | POA: Insufficient documentation

## 2020-02-17 DIAGNOSIS — I4891 Unspecified atrial fibrillation: Secondary | ICD-10-CM | POA: Insufficient documentation

## 2020-02-17 LAB — CBC WITH DIFFERENTIAL/PLATELET
Basophils Absolute: 0.1 10*3/uL (ref 0.0–0.2)
Basos: 1 %
EOS (ABSOLUTE): 0.6 10*3/uL — ABNORMAL HIGH (ref 0.0–0.4)
Eos: 7 %
Hematocrit: 45 % (ref 37.5–51.0)
Hemoglobin: 15.6 g/dL (ref 13.0–17.7)
Immature Grans (Abs): 0.1 10*3/uL (ref 0.0–0.1)
Immature Granulocytes: 1 %
Lymphocytes Absolute: 1.8 10*3/uL (ref 0.7–3.1)
Lymphs: 23 %
MCH: 32.1 pg (ref 26.6–33.0)
MCHC: 34.7 g/dL (ref 31.5–35.7)
MCV: 93 fL (ref 79–97)
Monocytes Absolute: 1.2 10*3/uL — ABNORMAL HIGH (ref 0.1–0.9)
Monocytes: 16 %
Neutrophils Absolute: 3.9 10*3/uL (ref 1.4–7.0)
Neutrophils: 52 %
Platelets: 220 10*3/uL (ref 150–450)
RBC: 4.86 x10E6/uL (ref 4.14–5.80)
RDW: 12.9 % (ref 11.6–15.4)
WBC: 7.6 10*3/uL (ref 3.4–10.8)

## 2020-02-17 LAB — ECHOCARDIOGRAM COMPLETE
Area-P 1/2: 3.72 cm2
MV M vel: 4.06 m/s
MV Peak grad: 65.9 mmHg
P 1/2 time: 593 msec
S' Lateral: 3.1 cm

## 2020-02-17 LAB — BASIC METABOLIC PANEL
BUN/Creatinine Ratio: 17 (ref 10–24)
BUN: 22 mg/dL (ref 8–27)
CO2: 22 mmol/L (ref 20–29)
Calcium: 9.5 mg/dL (ref 8.6–10.2)
Chloride: 100 mmol/L (ref 96–106)
Creatinine, Ser: 1.3 mg/dL — ABNORMAL HIGH (ref 0.76–1.27)
GFR calc Af Amer: 60 mL/min/{1.73_m2} (ref >59)
GFR calc non Af Amer: 52 mL/min/{1.73_m2} — ABNORMAL LOW (ref >59)
Glucose: 67 mg/dL (ref 65–99)
Potassium: 5.3 mmol/L — ABNORMAL HIGH (ref 3.5–5.2)
Sodium: 135 mmol/L (ref 134–144)

## 2020-02-17 NOTE — Progress Notes (Signed)
*  PRELIMINARY RESULTS* Echocardiogram 2D Echocardiogram has been performed.  Mario [name] 02/17/2020, 9:01 AM

## 2020-02-19 ENCOUNTER — Other Ambulatory Visit (HOSPITAL_COMMUNITY)
Admission: RE | Admit: 2020-02-19 | Discharge: 2020-02-19 | Disposition: A | Discharge status: Home / Self Care | Payer: Medicare HMO | Source: Ambulatory Visit | Attending: Cardiology | Admitting: Cardiology

## 2020-02-19 DIAGNOSIS — Z01812 Encounter for preprocedural laboratory examination: Secondary | ICD-10-CM | POA: Insufficient documentation

## 2020-02-19 DIAGNOSIS — Z20822 Contact with and (suspected) exposure to covid-19: Secondary | ICD-10-CM | POA: Diagnosis not present

## 2020-02-20 LAB — SARS CORONAVIRUS 2 (TAT 6-24 HRS): SARS Coronavirus 2: NEGATIVE

## 2020-02-21 DIAGNOSIS — Z85820 Personal history of malignant melanoma of skin: Secondary | ICD-10-CM | POA: Diagnosis not present

## 2020-02-21 DIAGNOSIS — C44319 Basal cell carcinoma of skin of other parts of face: Secondary | ICD-10-CM | POA: Diagnosis not present

## 2020-02-22 ENCOUNTER — Ambulatory Visit (HOSPITAL_COMMUNITY): Payer: Medicare HMO | Admitting: Anesthesiology

## 2020-02-22 ENCOUNTER — Encounter (HOSPITAL_COMMUNITY)
Admission: RE | Disposition: A | Discharge status: Home / Self Care | Payer: Self-pay | Source: Home / Self Care | Attending: Cardiology

## 2020-02-22 ENCOUNTER — Ambulatory Visit (HOSPITAL_COMMUNITY)
Admission: RE | Admit: 2020-02-22 | Discharge: 2020-02-22 | Disposition: A | Discharge status: Home / Self Care | Payer: Medicare HMO | Attending: Cardiology | Admitting: Cardiology

## 2020-02-22 ENCOUNTER — Encounter (HOSPITAL_COMMUNITY): Payer: Self-pay | Admitting: Cardiology

## 2020-02-22 ENCOUNTER — Other Ambulatory Visit: Payer: Self-pay

## 2020-02-22 DIAGNOSIS — Z79899 Other long term (current) drug therapy: Secondary | ICD-10-CM | POA: Insufficient documentation

## 2020-02-22 DIAGNOSIS — Z87891 Personal history of nicotine dependence: Secondary | ICD-10-CM | POA: Insufficient documentation

## 2020-02-22 DIAGNOSIS — I4819 Other persistent atrial fibrillation: Secondary | ICD-10-CM | POA: Diagnosis not present

## 2020-02-22 DIAGNOSIS — Z7901 Long term (current) use of anticoagulants: Secondary | ICD-10-CM | POA: Diagnosis not present

## 2020-02-22 DIAGNOSIS — I10 Essential (primary) hypertension: Secondary | ICD-10-CM | POA: Diagnosis not present

## 2020-02-22 DIAGNOSIS — G4733 Obstructive sleep apnea (adult) (pediatric): Secondary | ICD-10-CM | POA: Diagnosis not present

## 2020-02-22 DIAGNOSIS — I4892 Unspecified atrial flutter: Secondary | ICD-10-CM | POA: Diagnosis not present

## 2020-02-22 DIAGNOSIS — Z888 Allergy status to other drugs, medicaments and biological substances status: Secondary | ICD-10-CM | POA: Diagnosis not present

## 2020-02-22 DIAGNOSIS — I480 Paroxysmal atrial fibrillation: Secondary | ICD-10-CM

## 2020-02-22 DIAGNOSIS — Z9989 Dependence on other enabling machines and devices: Secondary | ICD-10-CM | POA: Diagnosis not present

## 2020-02-22 HISTORY — PX: CARDIOVERSION: SHX1299

## 2020-02-22 SURGERY — CARDIOVERSION
Anesthesia: General

## 2020-02-22 MED ORDER — PROPOFOL 10 MG/ML IV BOLUS
INTRAVENOUS | Status: DC | PRN
  Administered 2020-02-22: 60 mg via INTRAVENOUS

## 2020-02-22 MED ORDER — SODIUM CHLORIDE 0.9 % IV SOLN: INTRAVENOUS | Status: DC | PRN

## 2020-02-22 MED ORDER — LIDOCAINE 2% (20 MG/ML) 5 ML SYRINGE
INTRAMUSCULAR | Status: DC | PRN
  Administered 2020-02-22: 60 mg via INTRAVENOUS

## 2020-02-22 MED ORDER — SODIUM CHLORIDE 0.9 % IV SOLN: Freq: Once | INTRAVENOUS | Status: AC

## 2020-02-22 NOTE — Discharge Instructions (Signed)
Electrical Cardioversion Electrical cardioversion is the delivery of a jolt of electricity to restore a normal rhythm to the heart. A rhythm that is too fast or is not regular keeps the heart from pumping well. In this procedure, sticky patches or metal paddles are placed on the chest to deliver electricity to the heart from a device. This procedure may be done in an emergency if:  There is low or no blood pressure as a result of the heart rhythm.  Normal rhythm must be restored as fast as possible to protect the brain and heart from further damage.  It may save a life. This may also be a scheduled procedure for irregular or fast heart rhythms that are not immediately life-threatening. Tell a health care provider about:  Any allergies you have.  All medicines you are taking, including vitamins, herbs, eye drops, creams, and over-the-counter medicines.  Any problems you or family members have had with anesthetic medicines.  Any blood disorders you have.  Any surgeries you have had.  Any medical conditions you have.  Whether you are pregnant or may be pregnant. What are the risks? Generally, this is a safe procedure. However, problems may occur, including:  Allergic reactions to medicines.  A blood clot that breaks free and travels to other parts of your body.  The possible return of an abnormal heart rhythm within hours or days after the procedure.  Your heart stopping (cardiac arrest). This is rare. What happens before the procedure? Medicines  Your health care provider may have you start taking: ? Blood-thinning medicines (anticoagulants) so your blood does not clot as easily. ? Medicines to help stabilize your heart rate and rhythm.  Ask your health care provider about: ? Changing or stopping your regular medicines. This is especially important if you are taking diabetes medicines or blood thinners. ? Taking medicines such as aspirin and ibuprofen. These medicines can  thin your blood. Do not take these medicines unless your health care provider tells you to take them. ? Taking over-the-counter medicines, vitamins, herbs, and supplements. General instructions  Follow instructions from your health care provider about eating or drinking restrictions.  Plan to have someone take you home from the hospital or clinic.  If you will be going home right after the procedure, plan to have someone with you for 24 hours.  Ask your health care provider what steps will be taken to help prevent infection. These may include washing your skin with a germ-killing soap. What happens during the procedure?  An IV will be inserted into one of your veins.  Sticky patches (electrodes) or metal paddles may be placed on your chest.  You will be given a medicine to help you relax (sedative).  An electrical shock will be delivered. The procedure may vary among health care providers and hospitals.   What can I expect after the procedure?  Your blood pressure, heart rate, breathing rate, and blood oxygen level will be monitored until you leave the hospital or clinic.  Your heart rhythm will be watched to make sure it does not change.  You may have some redness on the skin where the shocks were given. Follow these instructions at home:  Do not drive for 24 hours if you were given a sedative during your procedure.  Take over-the-counter and prescription medicines only as told by your health care provider.  Ask your health care provider how to check your pulse. Check it often.  Rest for 48 hours after the procedure   or as told by your health care provider.  Avoid or limit your caffeine use as told by your health care provider.  Keep all follow-up visits as told by your health care provider. This is important. Contact a health care provider if:  You feel like your heart is beating too quickly or your pulse is not regular.  You have a serious muscle cramp that does not go  away. Get help right away if:  You have discomfort in your chest.  You are dizzy or you feel faint.  You have trouble breathing or you are short of breath.  Your speech is slurred.  You have trouble moving an arm or leg on one side of your body.  Your fingers or toes turn cold or blue. Summary  Electrical cardioversion is the delivery of a jolt of electricity to restore a normal rhythm to the heart.  This procedure may be done right away in an emergency or may be a scheduled procedure if the condition is not an emergency.  Generally, this is a safe procedure.  After the procedure, check your pulse often as told by your health care provider. This information is not intended to replace advice given to you by your health care provider. Make sure you discuss any questions you have with your health care provider. Document Revised: 08/31/2018 Document Reviewed: 08/31/2018 Elsevier Patient Education  2021 [employer]  

## 2020-02-22 NOTE — Anesthesia Postprocedure Evaluation (Signed)
Anesthesia Post Note  Patient: Izen Petz  Procedure(s) Performed: CARDIOVERSION (N/A )     Patient location during evaluation: Endoscopy Anesthesia Type: General Level of consciousness: awake and alert Pain management: pain level controlled Vital Signs Assessment: post-procedure vital signs reviewed and stable Respiratory status: spontaneous breathing, nonlabored ventilation, respiratory function stable and patient connected to nasal cannula oxygen Cardiovascular status: blood pressure returned to baseline and stable Postop Assessment: no apparent nausea or vomiting Anesthetic complications: no   No complications documented.  Last Vitals:  Vitals:   02/22/20 0854 02/22/20 0908  BP: (!) 114/59 127/60  Pulse: 62 65  Resp: 13 10  Temp: 36.6 C   SpO2:      Last Pain:  Vitals:   02/22/20 0854  TempSrc: Oral  PainSc: 0-No pain                 Catalina Gravel

## 2020-02-22 NOTE — Anesthesia Preprocedure Evaluation (Addendum)
Anesthesia Evaluation  Patient identified by MRN, date of birth, ID band Patient awake    Reviewed: Allergy & Precautions, H&P , NPO status , Patient's Chart, lab work & pertinent test results  History of Anesthesia Complications Negative for: history of anesthetic complications  Airway Mallampati: II  TM Distance: >3 FB Neck ROM: Full    Dental no notable dental hx. (+) Dental Advisory Given   Pulmonary sleep apnea , former smoker,    Pulmonary exam normal        Cardiovascular hypertension, Pt. on medications and Pt. on home beta blockers + dysrhythmias Atrial Fibrillation  Rhythm:Irregular Rate:Normal  IMPRESSIONS    1. Left ventricular ejection fraction, by estimation, is 60 to 65%. The  left ventricle has normal function. The left ventricle has no regional  wall motion abnormalities. There is mild concentric left ventricular  hypertrophy of the basal-septal segment.  Left ventricular diastolic parameters are indeterminate.  2. Right ventricular systolic function is normal. The right ventricular  size is normal.  3. Left atrial size was moderately dilated.  4. Right atrial size was moderately dilated.  5. The mitral valve is normal in structure. Mild mitral valve  regurgitation.  6. The aortic valve is calcified. There is mild calcification of the  aortic valve. There is mild thickening of the aortic valve. Aortic valve  regurgitation is mild to moderate. Mild aortic valve sclerosis is present,  with no evidence of aortic valve  stenosis.  7. The inferior vena cava is normal in size with greater than 50%  respiratory variability, suggesting right atrial pressure of 3 mmHg.   Comparison(s): A prior study was performed on 04/30/2018. Similar aortic  regurgitation with subtle increase in left ventricular size. Ascending  aorta not well visualized in this study for comparison; mital valve  regurgitation not seen in  prior study.    Neuro/Psych negative neurological ROS  negative psych ROS   GI/Hepatic negative GI ROS, Neg liver ROS,   Endo/Other  negative endocrine ROS  Renal/GU negative Renal ROS  negative genitourinary   Musculoskeletal negative musculoskeletal ROS (+)   Abdominal   Peds negative pediatric ROS (+)  Hematology negative hematology ROS (+)   Anesthesia Other Findings   Reproductive/Obstetrics negative OB ROS                            Anesthesia Physical Anesthesia Plan  ASA: III  Anesthesia Plan: General   Post-op Pain Management:    Induction: Intravenous  PONV Risk Score and Plan: 2 and Treatment may vary due to age or medical condition and TIVA  Airway Management Planned: Mask  Additional Equipment:   Intra-op Plan:   Post-operative Plan:   Informed Consent: I have reviewed the patients History and Physical, chart, labs and discussed the procedure including the risks, benefits and alternatives for the proposed anesthesia with the patient or authorized representative who has indicated his/her understanding and acceptance.     Dental advisory given  Plan Discussed with: Anesthesiologist and CRNA  Anesthesia Plan Comments:        Anesthesia Quick Evaluation

## 2020-02-22 NOTE — CV Procedure (Signed)
Procedure:   DCCV  Indication:  Symptomatic atrial fibrillation  Procedure Note:  The patient signed informed consent.  They have had had therapeutic anticoagulation with apixaban greater than 3 weeks.  Anesthesia was administered by Dr. Gifford Shave.  Patient received 60 mg IV lidocaine and 60 mg IV propofol.Adequate airway was maintained throughout and vital followed per protocol.  They were cardioverted x 1 with 120J of biphasic synchronized energy.  They converted to NSR.  There were no apparent complications.  The patient had normal neuro status and respiratory status post procedure with vitals stable as recorded elsewhere.    Follow up:  They will continue on current medical therapy and follow up with cardiology as scheduled.  Buford Dresser, MD PhD 02/22/2020 8:42 AM

## 2020-02-22 NOTE — Anesthesia Procedure Notes (Signed)
Procedure Name: General with mask airway Date/Time: 02/22/2020 8:31 AM Performed by: Amadeo Garnet, CRNA Pre-anesthesia Checklist: Patient identified, Emergency Drugs available, Patient being monitored and Suction available Patient Re-evaluated:Patient Re-evaluated prior to induction Oxygen Delivery Method: Ambu bag Preoxygenation: Pre-oxygenation with 100% oxygen Induction Type: IV induction Placement Confirmation: positive ETCO2 Dental Injury: Teeth and Oropharynx as per pre-operative assessment

## 2020-02-22 NOTE — Transfer of Care (Signed)
Immediate Anesthesia Transfer of Care Note  Patient: Mario [name]  Procedure(s) Performed: CARDIOVERSION (N/A )  Patient Location: Endoscopy Unit  Anesthesia Type:General  Level of Consciousness: awake, alert  and oriented  Airway & Oxygen Therapy: Patient Spontanous Breathing and Patient connected to nasal cannula oxygen  Post-op Assessment: Report given to RN, Post -op Vital signs reviewed and stable and Patient moving all extremities  Post vital signs: Reviewed and stable  Last Vitals:  Vitals Value Taken Time  BP    Temp    Pulse    Resp    SpO2      Last Pain:  Vitals:   02/22/20 0750  TempSrc: Oral  PainSc: 0-No pain         Complications: No complications documented.

## 2020-02-22 NOTE — Interval H&P Note (Signed)
History and Physical Interval Note:  02/22/2020 8:12 AM  Mario [name]  has presented today for surgery, with the diagnosis of AFIB.  The various methods of treatment have been discussed with the patient and family. After consideration of risks, benefits and other options for treatment, the patient has consented to  Procedure(s): CARDIOVERSION (N/A) as a surgical intervention.  The patient's history has been reviewed, patient examined, no change in status, stable for surgery.  I have reviewed the patient's chart and labs.  Questions were answered to the patient's satisfaction.     Darin Arndt [name]

## 2020-02-23 ENCOUNTER — Encounter (HOSPITAL_COMMUNITY): Payer: Self-pay | Admitting: Cardiology

## 2020-02-23 ENCOUNTER — Telehealth (HOSPITAL_COMMUNITY): Payer: Self-pay | Admitting: Emergency Medicine

## 2020-02-23 NOTE — Telephone Encounter (Signed)
Reaching out to patient to offer assistance regarding upcoming cardiac imaging study; pt verbalizes understanding of appt date/time, parking situation and where to check in, pre-test NPO status and medications ordered, and verified current allergies; name and call back number provided for further questions should they arise Meha Vidrine RN Navigator Cardiac Imaging [facility] [phone] office [phone] cell 

## 2020-02-24 ENCOUNTER — Ambulatory Visit (HOSPITAL_COMMUNITY)
Admission: RE | Admit: 2020-02-24 | Discharge: 2020-02-24 | Disposition: A | Discharge status: Home / Self Care | Payer: Medicare HMO | Source: Ambulatory Visit | Attending: Internal Medicine | Admitting: Internal Medicine

## 2020-02-24 ENCOUNTER — Other Ambulatory Visit: Payer: Self-pay

## 2020-02-24 ENCOUNTER — Telehealth: Payer: Self-pay | Admitting: Cardiovascular Disease

## 2020-02-24 DIAGNOSIS — I4891 Unspecified atrial fibrillation: Secondary | ICD-10-CM | POA: Diagnosis not present

## 2020-02-24 MED ORDER — IOHEXOL 350 MG/ML SOLN
80.0000 mL | Freq: Once | INTRAVENOUS | Status: AC | PRN
  Administered 2020-02-24: 80 mL via INTRAVENOUS

## 2020-02-24 NOTE — Telephone Encounter (Signed)
Pt states after having his cardiac CT today he felt very short of breath and experienced some wheezing. At the time of our call, pt states shortness of breath is almost resolved. He states he also has a rash on his back and face, with 8-10 welts spread over him. After consulting with DOD Dr. [place], pt was advised to take 25 mg of p.o. Benadryl every 6 hours. Advised pt that if shortness of breath or rash worsen, he should seek immediate medical assistance. Also advised to push fluids. The patient verbalizes understanding and agreement with plan.

## 2020-02-24 NOTE — Telephone Encounter (Signed)
Pt c/o Shortness Of Breath: STAT if SOB developed within the last 24 hours or pt is noticeably SOB on the phone  1. Are you currently SOB (can you hear that pt is SOB on the phone)? no  2. How long have you been experiencing SOB? Started after he left his cardiac CT. He was walking up the ramp outside of the hospital to his car and started getting SOB. Got worse while driving on the way home.   3. Are you SOB when sitting or when up moving around? both  4. Are you currently experiencing any other symptoms? Back and head were burning. When he got home his wife took his shirt off and states he had "8-10 washed stone spots on his back."

## 2020-02-29 ENCOUNTER — Other Ambulatory Visit (HOSPITAL_COMMUNITY)
Admission: RE | Admit: 2020-02-29 | Discharge: 2020-02-29 | Disposition: A | Discharge status: Home / Self Care | Payer: Medicare HMO | Source: Ambulatory Visit | Attending: Internal Medicine | Admitting: Internal Medicine

## 2020-02-29 DIAGNOSIS — Z01812 Encounter for preprocedural laboratory examination: Secondary | ICD-10-CM | POA: Insufficient documentation

## 2020-02-29 DIAGNOSIS — Z20822 Contact with and (suspected) exposure to covid-19: Secondary | ICD-10-CM | POA: Insufficient documentation

## 2020-02-29 LAB — SARS CORONAVIRUS 2 (TAT 6-24 HRS): SARS Coronavirus 2: NEGATIVE

## 2020-03-01 NOTE — Progress Notes (Signed)
Instructed patient on the following items: Arrival time 0830 Nothing to eat or drink after midnight No meds AM of procedure Responsible person to drive you home and stay with you for 24 hrs  Have you missed any doses of anti-coagulant Eliqus- hasn't missed any doses.

## 2020-03-02 ENCOUNTER — Other Ambulatory Visit: Payer: Self-pay

## 2020-03-02 ENCOUNTER — Ambulatory Visit (HOSPITAL_COMMUNITY): Payer: Medicare HMO | Admitting: Anesthesiology

## 2020-03-02 ENCOUNTER — Emergency Department (HOSPITAL_BASED_OUTPATIENT_CLINIC_OR_DEPARTMENT_OTHER)
Admission: EM | Admit: 2020-03-02 | Discharge: 2020-03-03 | Disposition: A | Discharge status: Home / Self Care | Payer: Medicare HMO | Source: Home / Self Care | Attending: Emergency Medicine | Admitting: Emergency Medicine

## 2020-03-02 ENCOUNTER — Ambulatory Visit (HOSPITAL_COMMUNITY)
Admission: RE | Admit: 2020-03-02 | Discharge: 2020-03-02 | Disposition: A | Discharge status: Home / Self Care | Payer: Medicare HMO | Attending: Internal Medicine | Admitting: Internal Medicine

## 2020-03-02 ENCOUNTER — Encounter (HOSPITAL_COMMUNITY)
Admission: RE | Disposition: A | Discharge status: Home / Self Care | Payer: Medicare HMO | Source: Home / Self Care | Attending: Internal Medicine

## 2020-03-02 DIAGNOSIS — I484 Atypical atrial flutter: Secondary | ICD-10-CM | POA: Diagnosis not present

## 2020-03-02 DIAGNOSIS — Z79899 Other long term (current) drug therapy: Secondary | ICD-10-CM | POA: Diagnosis not present

## 2020-03-02 DIAGNOSIS — Z743 Need for continuous supervision: Secondary | ICD-10-CM | POA: Diagnosis not present

## 2020-03-02 DIAGNOSIS — R609 Edema, unspecified: Secondary | ICD-10-CM | POA: Diagnosis not present

## 2020-03-02 DIAGNOSIS — I4819 Other persistent atrial fibrillation: Secondary | ICD-10-CM | POA: Diagnosis not present

## 2020-03-02 DIAGNOSIS — Z7901 Long term (current) use of anticoagulants: Secondary | ICD-10-CM | POA: Insufficient documentation

## 2020-03-02 DIAGNOSIS — G4733 Obstructive sleep apnea (adult) (pediatric): Secondary | ICD-10-CM | POA: Insufficient documentation

## 2020-03-02 DIAGNOSIS — T889XXA Complication of surgical and medical care, unspecified, initial encounter: Secondary | ICD-10-CM | POA: Insufficient documentation

## 2020-03-02 DIAGNOSIS — Z9989 Dependence on other enabling machines and devices: Secondary | ICD-10-CM | POA: Diagnosis not present

## 2020-03-02 DIAGNOSIS — Z95810 Presence of automatic (implantable) cardiac defibrillator: Secondary | ICD-10-CM | POA: Insufficient documentation

## 2020-03-02 DIAGNOSIS — M7989 Other specified soft tissue disorders: Secondary | ICD-10-CM

## 2020-03-02 DIAGNOSIS — Z87891 Personal history of nicotine dependence: Secondary | ICD-10-CM | POA: Insufficient documentation

## 2020-03-02 DIAGNOSIS — R1909 Other intra-abdominal and pelvic swelling, mass and lump: Secondary | ICD-10-CM | POA: Insufficient documentation

## 2020-03-02 DIAGNOSIS — R2241 Localized swelling, mass and lump, right lower limb: Secondary | ICD-10-CM | POA: Insufficient documentation

## 2020-03-02 DIAGNOSIS — I4891 Unspecified atrial fibrillation: Secondary | ICD-10-CM | POA: Diagnosis not present

## 2020-03-02 DIAGNOSIS — I4892 Unspecified atrial flutter: Secondary | ICD-10-CM | POA: Diagnosis not present

## 2020-03-02 DIAGNOSIS — Z85820 Personal history of malignant melanoma of skin: Secondary | ICD-10-CM | POA: Insufficient documentation

## 2020-03-02 DIAGNOSIS — Z888 Allergy status to other drugs, medicaments and biological substances status: Secondary | ICD-10-CM | POA: Insufficient documentation

## 2020-03-02 DIAGNOSIS — R0989 Other specified symptoms and signs involving the circulatory and respiratory systems: Secondary | ICD-10-CM | POA: Diagnosis not present

## 2020-03-02 DIAGNOSIS — R0902 Hypoxemia: Secondary | ICD-10-CM | POA: Diagnosis not present

## 2020-03-02 DIAGNOSIS — Z87442 Personal history of urinary calculi: Secondary | ICD-10-CM | POA: Insufficient documentation

## 2020-03-02 DIAGNOSIS — I10 Essential (primary) hypertension: Secondary | ICD-10-CM | POA: Insufficient documentation

## 2020-03-02 HISTORY — PX: ATRIAL FIBRILLATION ABLATION: EP1191

## 2020-03-02 LAB — POCT ACTIVATED CLOTTING TIME
Activated Clotting Time: 339 seconds
Activated Clotting Time: 345 seconds

## 2020-03-02 SURGERY — ATRIAL FIBRILLATION ABLATION
Anesthesia: General

## 2020-03-02 MED ORDER — PHENYLEPHRINE HCL-NACL 10-0.9 MG/250ML-% IV SOLN
INTRAVENOUS | Status: DC | PRN
  Administered 2020-03-02: 30 ug/min via INTRAVENOUS

## 2020-03-02 MED ORDER — HEPARIN SODIUM (PORCINE) 1000 UNIT/ML IJ SOLN
INTRAMUSCULAR | Status: AC
  Filled 2020-03-02: qty 2

## 2020-03-02 MED ORDER — PROTAMINE SULFATE 10 MG/ML IV SOLN
INTRAVENOUS | Status: DC | PRN
  Administered 2020-03-02: 10 mg via INTRAVENOUS
  Administered 2020-03-02: 30 mg via INTRAVENOUS

## 2020-03-02 MED ORDER — HEPARIN (PORCINE) IN NACL 1000-0.9 UT/500ML-% IV SOLN
INTRAVENOUS | Status: AC
  Filled 2020-03-02: qty 500

## 2020-03-02 MED ORDER — LIDOCAINE 2% (20 MG/ML) 5 ML SYRINGE
INTRAMUSCULAR | Status: DC | PRN
  Administered 2020-03-02: 60 mg via INTRAVENOUS

## 2020-03-02 MED ORDER — SODIUM CHLORIDE 0.9% FLUSH: 3.0000 mL | Freq: Two times a day (BID) | INTRAVENOUS | Status: DC

## 2020-03-02 MED ORDER — HEPARIN (PORCINE) IN NACL 1000-0.9 UT/500ML-% IV SOLN
INTRAVENOUS | Status: DC | PRN
  Administered 2020-03-02: 500 mL

## 2020-03-02 MED ORDER — ACETAMINOPHEN 325 MG PO TABS: 650.0000 mg | ORAL_TABLET | ORAL | Status: DC | PRN

## 2020-03-02 MED ORDER — HYDROCODONE-ACETAMINOPHEN 5-325 MG PO TABS: 1.0000 | ORAL_TABLET | ORAL | Status: DC | PRN

## 2020-03-02 MED ORDER — HEPARIN SODIUM (PORCINE) 1000 UNIT/ML IJ SOLN
INTRAMUSCULAR | Status: DC | PRN
  Administered 2020-03-02: 1000 [IU] via INTRAVENOUS
  Administered 2020-03-02: 15000 [IU] via INTRAVENOUS

## 2020-03-02 MED ORDER — PANTOPRAZOLE SODIUM 40 MG PO TBEC: 40.0000 mg | DELAYED_RELEASE_TABLET | Freq: Every day | ORAL | 0 refills | Status: DC

## 2020-03-02 MED ORDER — ROCURONIUM BROMIDE 10 MG/ML (PF) SYRINGE
PREFILLED_SYRINGE | INTRAVENOUS | Status: DC | PRN
  Administered 2020-03-02: 40 mg via INTRAVENOUS
  Administered 2020-03-02: 60 mg via INTRAVENOUS

## 2020-03-02 MED ORDER — DEXAMETHASONE SODIUM PHOSPHATE 10 MG/ML IJ SOLN
INTRAMUSCULAR | Status: DC | PRN
  Administered 2020-03-02: 10 mg via INTRAVENOUS

## 2020-03-02 MED ORDER — HEPARIN SODIUM (PORCINE) 1000 UNIT/ML IJ SOLN
INTRAMUSCULAR | Status: DC | PRN
  Administered 2020-03-02 (×2): 1000 [IU] via INTRAVENOUS

## 2020-03-02 MED ORDER — PHENYLEPHRINE 40 MCG/ML (10ML) SYRINGE FOR IV PUSH (FOR BLOOD PRESSURE SUPPORT)
PREFILLED_SYRINGE | INTRAVENOUS | Status: DC | PRN
  Administered 2020-03-02: 80 ug via INTRAVENOUS

## 2020-03-02 MED ORDER — PROPOFOL 10 MG/ML IV BOLUS
INTRAVENOUS | Status: DC | PRN
  Administered 2020-03-02: 200 mg via INTRAVENOUS

## 2020-03-02 MED ORDER — ONDANSETRON HCL 4 MG/2ML IJ SOLN
INTRAMUSCULAR | Status: DC | PRN
  Administered 2020-03-02: 4 mg via INTRAVENOUS

## 2020-03-02 MED ORDER — ONDANSETRON HCL 4 MG/2ML IJ SOLN: 4.0000 mg | Freq: Four times a day (QID) | INTRAMUSCULAR | Status: DC | PRN

## 2020-03-02 MED ORDER — APIXABAN 5 MG PO TABS
5.0000 mg | ORAL_TABLET | Freq: Once | ORAL | Status: AC
  Administered 2020-03-02: 5 mg via ORAL
  Filled 2020-03-02: qty 1

## 2020-03-02 MED ORDER — SODIUM CHLORIDE 0.9% FLUSH: 3.0000 mL | INTRAVENOUS | Status: DC | PRN

## 2020-03-02 MED ORDER — SODIUM CHLORIDE 0.9 % IV SOLN: INTRAVENOUS | Status: DC

## 2020-03-02 MED ORDER — FENTANYL CITRATE (PF) 250 MCG/5ML IJ SOLN
INTRAMUSCULAR | Status: DC | PRN
  Administered 2020-03-02: 100 ug via INTRAVENOUS

## 2020-03-02 MED ORDER — SUGAMMADEX SODIUM 200 MG/2ML IV SOLN
INTRAVENOUS | Status: DC | PRN
  Administered 2020-03-02: 200 mg via INTRAVENOUS

## 2020-03-02 MED ORDER — SODIUM CHLORIDE 0.9 % IV SOLN: 250.0000 mL | INTRAVENOUS | Status: DC | PRN

## 2020-03-02 SURGICAL SUPPLY — 19 items
BLANKET WARM UNDERBOD FULL ACC (MISCELLANEOUS) ×2 IMPLANT
CATH MAPPNG PENTARAY F 2-6-2MM (CATHETERS) IMPLANT
CATH SMTCH THERMOCOOL SF DF (CATHETERS) ×1 IMPLANT
CATH SOUNDSTAR ECO 8FR (CATHETERS) ×1 IMPLANT
CATH WEBSTER BI DIR CS D-F CRV (CATHETERS) ×1 IMPLANT
CLOSURE PERCLOSE PROSTYLE (VASCULAR PRODUCTS) ×3 IMPLANT
COVER SWIFTLINK CONNECTOR (BAG) ×2 IMPLANT
NDL BAYLIS TRANSSEPTAL 71CM (NEEDLE) IMPLANT
NEEDLE BAYLIS TRANSSEPTAL 71CM (NEEDLE) ×2 IMPLANT
PACK EP LATEX FREE (CUSTOM PROCEDURE TRAY) ×2
PACK EP LF (CUSTOM PROCEDURE TRAY) ×1 IMPLANT
PAD PRO RADIOLUCENT 2001M-C (PAD) ×2 IMPLANT
PATCH CARTO3 (PAD) ×1 IMPLANT
PENTARAY F 2-6-2MM (CATHETERS) ×2
SHEATH PINNACLE 7F 10CM (SHEATH) ×2 IMPLANT
SHEATH PINNACLE 9F 10CM (SHEATH) ×1 IMPLANT
SHEATH PROBE COVER 6X72 (BAG) ×1 IMPLANT
SHEATH SWARTZ TS SL2 63CM 8.5F (SHEATH) ×1 IMPLANT
TUBING SMART ABLATE COOLFLOW (TUBING) ×1 IMPLANT

## 2020-03-02 NOTE — Interval H&P Note (Signed)
History and Physical Interval Note:  03/02/2020 9:48 AM  Mario [name]  has presented today for surgery, with the diagnosis of A FIB.  The various methods of treatment have been discussed with the patient and family. After consideration of risks, benefits and other options for treatment, the patient has consented to  Procedure(s): ATRIAL FIBRILLATION ABLATION (N/A) as a surgical intervention.  The patient's history has been reviewed, patient examined, no change in status, stable for surgery.  I have reviewed the patient's chart and labs.  Questions were answered to the patient's satisfaction.    Cardiac CT reviewed with patient.  He had an allergic reaction to the contract but has recovered.. Reports compliance with eliquis without interruption.  Risk, benefits, and alternatives to EP study and radiofrequency ablation for afib were also discussed in detail today. These risks include but are not limited to stroke, bleeding, vascular damage, tamponade, perforation, damage to the esophagus, lungs, and other structures, pulmonary vein stenosis, worsening renal function, and death. The patient understands these risk and wishes to proceed.     [name]

## 2020-03-02 NOTE — Anesthesia Preprocedure Evaluation (Signed)
Anesthesia Evaluation  Patient identified by MRN, date of birth, ID band Patient awake    Reviewed: Allergy & Precautions, H&P , NPO status , Patient's Chart, lab work & pertinent test results  Airway Mallampati: II   Neck ROM: full    Dental   Pulmonary sleep apnea , former smoker,    breath sounds clear to auscultation       Cardiovascular hypertension, + dysrhythmias Atrial Fibrillation + Valvular Problems/Murmurs AI  Rhythm:regular Rate:Normal  TTE: EF 60%, mild-mod AI.   Neuro/Psych    GI/Hepatic   Endo/Other    Renal/GU stones     Musculoskeletal  (+) Arthritis ,   Abdominal   Peds  Hematology   Anesthesia Other Findings   Reproductive/Obstetrics                             Anesthesia Physical Anesthesia Plan  ASA: III  Anesthesia Plan: General   Post-op Pain Management:    Induction: Intravenous  PONV Risk Score and Plan: 2 and Ondansetron, Dexamethasone and Treatment may vary due to age or medical condition  Airway Management Planned: Oral ETT  Additional Equipment:   Intra-op Plan:   Post-operative Plan: Extubation in OR  Informed Consent: I have reviewed the patients History and Physical, chart, labs and discussed the procedure including the risks, benefits and alternatives for the proposed anesthesia with the patient or authorized representative who has indicated his/her understanding and acceptance.       Plan Discussed with: CRNA, Anesthesiologist and Surgeon  Anesthesia Plan Comments:         Anesthesia Quick Evaluation

## 2020-03-02 NOTE — Anesthesia Procedure Notes (Signed)
Procedure Name: Intubation Date/Time: 03/02/2020 10:51 AM Performed by: Griffin Dakin, CRNA Pre-anesthesia Checklist: Patient identified, Emergency Drugs available, Suction available and Patient being monitored Patient Re-evaluated:Patient Re-evaluated prior to induction Oxygen Delivery Method: Circle system utilized Preoxygenation: Pre-oxygenation with 100% oxygen Induction Type: IV induction Ventilation: Mask ventilation without difficulty and Oral airway inserted - appropriate to patient size Laryngoscope Size: Mac and 4 Grade View: Grade I Tube type: Oral Number of attempts: 1 Airway Equipment and Method: Stylet and Oral airway Placement Confirmation: ETT inserted through vocal cords under direct vision,  positive ETCO2 and breath sounds checked- equal and bilateral Secured at: 25 cm Tube secured with: Tape Dental Injury: Teeth and Oropharynx as per pre-operative assessment

## 2020-03-02 NOTE — Transfer of Care (Signed)
Immediate Anesthesia Transfer of Care Note  Patient: Mario [name]  Procedure(s) Performed: ATRIAL FIBRILLATION ABLATION (N/A )  Patient Location: Cath Lab  Anesthesia Type:General  Level of Consciousness: awake, alert  and oriented  Airway & Oxygen Therapy: Patient Spontanous Breathing and Patient connected to nasal cannula oxygen  Post-op Assessment: Report given to RN and Post -op Vital signs reviewed and stable  Post vital signs: Reviewed and stable  Last Vitals:  Vitals Value Taken Time  BP 116/59 03/02/20 1321  Temp    Pulse 57 03/02/20 1323  Resp 13 03/02/20 1323  SpO2 93 % 03/02/20 1323  Vitals shown include unvalidated device data.  Last Pain:  Vitals:   03/02/20 1256  TempSrc:   PainSc: 0-No pain      Patients Stated Pain Goal: 3 (16/07/37 1062)  Complications: No complications documented.

## 2020-03-02 NOTE — ED Triage Notes (Signed)
Patient presents from home s/p cardiac ablation for AFIB, D/C home around 1700 today. Patient on Eliquis, taking as prescribed. Patient was able to eat and drink after procedure. Patient noticed increased swelling to Rt femoral site and RLE since heading home. Swelling noted to Rt femoral site and upper RLE on exam. No active bleeding from site. Patient AAOx4. Denies chest pain/other complaints.

## 2020-03-02 NOTE — Discharge Instructions (Signed)
Post procedure care instructions No driving for 4 days. No lifting over 5 lbs for 1 week. No vigorous or sexual activity for 1 week. You may return to work/your usual activities on 03/09/20. Keep procedure site clean & dry. If you notice increased pain, swelling, bleeding or pus, call/return!  You may shower after 24 hours, but no soaking in baths/hot tubs/pools for 1 week.    You have an appointment set up with the [facility].  Multiple studies have shown that being followed by a dedicated atrial fibrillation clinic in addition to the standard care you receive from your other physicians improves health. We believe that enrollment in the atrial fibrillation clinic will allow [place] to better care for you.   The phone number to the [facility] is [phone]. The clinic is staffed Monday through Friday from 8:30am to 5pm.  Parking Directions: The clinic is located in the Heart and Vascular Building connected to [facility]. 1)From [address] turn on to [address] and go to the 3rd entrance  (Heart and Vascular entrance) on the right. 2)Look to the right for Heart &Vascular Parking Garage. 3)A code for the entrance is required, for February is 1212.   4)Take the elevators to the 1st floor. Registration is in the room with the glass walls at the end of the hallway.  If you have any trouble parking or locating the clinic, please dont hesitate to call [phone].   Cardiac Ablation, Care After  This sheet gives you information about how to care for yourself after your procedure. Your health care provider may also give you more specific instructions. If you have problems or questions, contact your health care provider. What can I expect after the procedure? After the procedure, it is common to have:  Bruising around your puncture site.  Tenderness around your puncture site.  Skipped heartbeats.  Tiredness (fatigue).  Follow these instructions at  home: Puncture site care   Follow instructions from your health care provider about how to take care of your puncture site. Make sure you: ? If present, leave stitches (sutures), skin glue, or adhesive strips in place. These skin closures may need to stay in place for up to 2 weeks. If adhesive strip edges start to loosen and curl up, you may trim the loose edges. Do not remove adhesive strips completely unless your health care provider tells you to do that. ? If a large square bandage is present, this may be removed 24 hours after surgery.   Check your puncture site every day for signs of infection. Check for: ? Redness, swelling, or pain. ? Fluid or blood. If your puncture site starts to bleed, lie down on your back, apply firm pressure to the area, and contact your health care provider. ? Warmth. ? Pus or a bad smell. Driving  Do not drive for at least 4 days after your procedure or however long your health care provider recommends. (Do not resume driving if you have previously been instructed not to drive for other health reasons.)  Do not drive or use heavy machinery while taking prescription pain medicine. Activity  Avoid activities that take a lot of effort for at least 7 days after your procedure.  Do not lift anything that is heavier than 5 lb (4.5 kg) for one week.   No sexual activity for 1 week.   Return to your normal activities as told by your health care provider. Ask your health care provider what activities  are safe for you. General instructions  Take over-the-counter and prescription medicines only as told by your health care provider.  Do not use any products that contain nicotine or tobacco, such as cigarettes and e-cigarettes. If you need help quitting, ask your health care provider.  You may shower after 24 hours, but Do not take baths, swim, or use a hot tub for 1 week.   Do not drink alcohol for 24 hours after your procedure.  Keep all follow-up visits as  told by your health care provider. This is important. Contact a health care provider if:  You have redness, mild swelling, or pain around your puncture site.  You have fluid or blood coming from your puncture site that stops after applying firm pressure to the area.  Your puncture site feels warm to the touch.  You have pus or a bad smell coming from your puncture site.  You have a fever.  You have chest pain or discomfort that spreads to your neck, jaw, or arm.  You are sweating a lot.  You feel nauseous.  You have a fast or irregular heartbeat.  You have shortness of breath.  You are dizzy or light-headed and feel the need to lie down.  You have pain or numbness in the arm or leg closest to your puncture site. Get help right away if:  Your puncture site suddenly swells.  Your puncture site is bleeding and the bleeding does not stop after applying firm pressure to the area. These symptoms may represent a serious problem that is an emergency. Do not wait to see if the symptoms will go away. Get medical help right away. Call your local emergency services (911 in the [place] Do not drive yourself to the hospital. Summary  After the procedure, it is normal to have bruising and tenderness at the puncture site in your groin, neck, or forearm.  Check your puncture site every day for signs of infection.  Get help right away if your puncture site is bleeding and the bleeding does not stop after applying firm pressure to the area. This is a medical emergency. This information is not intended to replace advice given to you by your health care provider. Make sure you discuss any questions you have with your health care provider.

## 2020-03-03 ENCOUNTER — Encounter (HOSPITAL_COMMUNITY): Payer: Self-pay | Admitting: Internal Medicine

## 2020-03-03 DIAGNOSIS — I4891 Unspecified atrial fibrillation: Secondary | ICD-10-CM

## 2020-03-03 DIAGNOSIS — M7989 Other specified soft tissue disorders: Secondary | ICD-10-CM | POA: Diagnosis not present

## 2020-03-03 DIAGNOSIS — R2240 Localized swelling, mass and lump, unspecified lower limb: Secondary | ICD-10-CM

## 2020-03-03 LAB — CBC WITH DIFFERENTIAL/PLATELET
Abs Immature Granulocytes: 0.06 10*3/uL (ref 0.00–0.07)
Basophils Absolute: 0 10*3/uL (ref 0.0–0.1)
Basophils Relative: 0 %
Eosinophils Absolute: 0 10*3/uL (ref 0.0–0.5)
Eosinophils Relative: 0 %
HCT: 44.5 % (ref 39.0–52.0)
Hemoglobin: 14.9 g/dL (ref 13.0–17.0)
Immature Granulocytes: 1 %
Lymphocytes Relative: 8 %
Lymphs Abs: 0.8 10*3/uL (ref 0.7–4.0)
MCH: 31.6 pg (ref 26.0–34.0)
MCHC: 33.5 g/dL (ref 30.0–36.0)
MCV: 94.3 fL (ref 80.0–100.0)
Monocytes Absolute: 0.6 10*3/uL (ref 0.1–1.0)
Monocytes Relative: 6 %
Neutro Abs: 8.5 10*3/uL — ABNORMAL HIGH (ref 1.7–7.7)
Neutrophils Relative %: 85 %
Platelets: 183 10*3/uL (ref 150–400)
RBC: 4.72 MIL/uL (ref 4.22–5.81)
RDW: 13.3 % (ref 11.5–15.5)
WBC: 10 10*3/uL (ref 4.0–10.5)
nRBC: 0 % (ref 0.0–0.2)

## 2020-03-03 LAB — BASIC METABOLIC PANEL
Anion gap: 8 (ref 5–15)
BUN: 19 mg/dL (ref 8–23)
CO2: 21 mmol/L — ABNORMAL LOW (ref 22–32)
Calcium: 8.8 mg/dL — ABNORMAL LOW (ref 8.9–10.3)
Chloride: 105 mmol/L (ref 98–111)
Creatinine, Ser: 1.14 mg/dL (ref 0.61–1.24)
GFR, Estimated: >60 mL/min (ref >60)
Glucose, Bld: 121 mg/dL — ABNORMAL HIGH (ref 70–99)
Potassium: 4.6 mmol/L (ref 3.5–5.1)
Sodium: 134 mmol/L — ABNORMAL LOW (ref 135–145)

## 2020-03-03 MED ORDER — DIPHENHYDRAMINE HCL 25 MG PO CAPS: 50.0000 mg | ORAL_CAPSULE | Freq: Once | ORAL | Status: AC

## 2020-03-03 MED ORDER — DIPHENHYDRAMINE HCL 50 MG/ML IJ SOLN
50.0000 mg | Freq: Once | INTRAMUSCULAR | Status: AC
  Administered 2020-03-03: 50 mg via INTRAVENOUS
  Filled 2020-03-03: qty 1

## 2020-03-03 MED ORDER — HYDROCORTISONE NA SUCCINATE PF 250 MG IJ SOLR
200.0000 mg | Freq: Once | INTRAMUSCULAR | Status: AC
  Administered 2020-03-03: 200 mg via INTRAVENOUS
  Filled 2020-03-03: qty 200

## 2020-03-03 MED ORDER — LACTATED RINGERS IV BOLUS
1000.0000 mL | Freq: Once | INTRAVENOUS | Status: AC
  Administered 2020-03-03: 1000 mL via INTRAVENOUS

## 2020-03-03 NOTE — Discharge Instructions (Addendum)
Keep your leg elevated as much as possible.  Follow-up with Dr. Rayann Heman on Monday as discussed.  Return to emergency room or contact Dr. Rayann Heman if you have any worsening symptoms over the weekend.  Call Dr. Rayann Heman with any concerns [phone]

## 2020-03-03 NOTE — Consult Note (Signed)
Cardiology Consultation:   Patient ID: Mario [name] MRN: [id number]; DOB: December 30, 1940  Admit date: 03/02/2020 Date of Consult: 03/03/2020  Primary Care Provider: Marrian Salvage, [facility] Cardiologist: Shelva Majestic, MD  [facility] Electrophysiologist:  [name], MD    Patient Profile:   Mario [name] is a 80 y.o. male with a hx of persistent atrial fibrillation status post catheter ablation on 03/03/2019 who is being seen today for the evaluation of right leg swelling at the request of Dr. Dayna Barker.  History of Present Illness:   Mr. Recla underwent catheter ablation of atrial fibrillation with pulmonary vein isolation and posterior wall isolation by Dr. Rayann Heman yesterday.  He had no complications of the procedure.  After arriving home, he noticed that his right leg was swollen.  He returned to the emergency department for evaluation.  He reports only very mild tenderness at the puncture site and otherwise no pain.  He has no numbness, paresthesias, loss of sensation, or weakness.  He has had no shortness of breath, chest discomfort, tachypalpitations, lightheadedness, or dizziness.  While in the emergency department, he developed a sensation of chills.  Past Medical History:  Diagnosis Date  . Aortic insufficiency   . Arthritis    neck  . History of kidney stones   . Hypertension   . Melanoma ([place]) 2000   back; stage 3, squamous 1 - basal- several  . OSA (obstructive sleep apnea)    uses CPAP  . Persistent atrial fibrillation (HCC)    afib - cardioverted 01/2015  . Phlebitis   . Pneumonia   . Seasonal allergies   . Tubular adenoma of colon 2013   colonoscpy in 5 years [name]    Past Surgical History:  Procedure Laterality Date  . 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 CARDIOVERSION N/A 12/01/2014   Procedure: CARDIOVERSION;  Surgeon: Troy Sine, MD;  Location: [facility] END[facility]COPY;  Service:  Cardiovascular;  Laterality: N/A;  . CARDIOVERSION N/A 02/22/2020   Procedure: CARDIOVERSION;  Surgeon: Buford Dresser, MD;  Location: [facility] ENDOSCOPY;  Service: Cardiovascular;  Laterality: N/A;  . CATARACT EXTRACTION  2002, 2004   bilateral  . CYSTOSCOPY    . ELBOW SURGERY Left 1984   tendon repair left arm  . INCISION AND DRAINAGE OF WOUND Left 08/28/2016   Procedure: IRRIGATION AND DEBRIDEMENT WOUND;  Surgeon: Iran Planas, MD;  Location: [place];  Service: Orthopedics;  Laterality: Left;  . LITHOTRIPSY    . melanoma removal  2000   back  . METACARPAL OSTEOTOMY Left 08/28/2016   Procedure: 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.;  Surgeon: Iran Planas, MD;  Location: [place];  Service: Orthopedics;  Laterality: Left;  [name] MICROLARYNGOSCOPY WITH LASER N/A 01/25/2015   Procedure: MICROLARYNGOSCOPY ;  Surgeon: Melida Quitter, MD;  Location: [place];  Service: ENT;  Laterality: N/A;  Microlaryngoscopy with 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 with CO2 laser   . TENDON REPAIR Right 2012   right  . TONSILLECTOMY    . UPPER GI ENDOSCOPY       Home Medications:  Prior to Admission medications   Medication Sig Start Date End Date Taking? Authorizing Provider  amLODipine (NORVASC) 10 MG tablet Take 1 tablet (10 mg total) by mouth daily. 05/03/19   Lendon Colonel, NP  carvedilol (COREG) 3.125 MG tablet Take 3.125 mg by mouth 2 (two) times daily with a meal.  [provider]  carvedilol (COREG) 6.25 MG tablet Take 1 tablet (6.25 mg total) by mouth 2 (two) times daily. 12/28/19   Troy Sine, MD  Cholecalciferol (DIALYVITE VITAMIN D 5000) 125 MCG (5000 UT) capsule Take 5,000 Units by mouth daily.    [provider]  ELIQUIS 5 MG TABS tablet Take 1 tablet by mouth twice daily Patient taking differently: Take 5 mg by mouth 2 (two) times daily. 05/24/19   Almyra Deforest, PA  flecainide (TAMBOCOR) 100 MG tablet Take 1 tablet (100 mg total) by mouth 2 (two) times daily.  05/03/19   Lendon Colonel, NP  GLUCOSAMINE-CHONDROITIN PO Take 1 tablet by mouth daily.     [provider]  hydrALAZINE (APRESOLINE) 25 MG tablet Take 1 tablet (25 mg total) by mouth 2 (two) times daily. Please schedule annual appt for refills. [id number]. 05/03/19   Lendon Colonel, NP  losartan (COZAAR) 100 MG tablet Take 1 tablet (100 mg total) by mouth daily. 05/03/19   Lendon Colonel, NP  Lutein 20 MG CAPS Take 20 mg by mouth 3 (three) times a week.    [provider]  Multiple Vitamin (MULTIVITAMIN) tablet Take 1 tablet by mouth daily.    [provider]  pantoprazole (PROTONIX) 40 MG tablet Take 1 tablet (40 mg total) by mouth daily. 03/02/20 04/16/20  Allred, Jeneen Rinks, MD  Saw Palmetto, Serenoa repens, (SAW PALMETTO PO) Take 1 tablet by mouth daily.    [provider]  testosterone cypionate (DEPOTESTOSTERONE CYPIONATE) 200 MG/ML injection Inject 100 mg into the muscle every Wednesday. 08/09/14   [provider]  triamcinolone (KENALOG) 0.1 % Apply 1 application topically 2 (two) times daily as needed (red bumps). 01/21/20   [provider]    Inpatient Medications: Scheduled Meds: . diphenhydrAMINE  50 mg Oral Once   Or  . diphenhydrAMINE  50 mg Intravenous Once   Continuous Infusions:  PRN Meds:   Allergies:    Allergies  Allergen Reactions  . Contrast Media [Iodinated Diagnostic Agents] Shortness Of Breath, Rash and Other (See Comments)    First noted after Coronary CTA 02/24/20. Pt experienced wheezing, shortness of breath and diffuse rash/welts over face and trunk.  . Lisinopril Other (See Comments)    REACTION: severe GI upset requiring EGD--temporally related to lisinopril--tolerated micardis    Social History:   Social History   Socioeconomic History  . Marital status: Married    Spouse name: Not on file  . Number of children: 2  . Years of education: Not on file  . Highest education level: Not on file   Occupational History  . Occupation: retired  Tobacco Use  . Smoking status: Former Smoker    Start date: 11/20/1976    Quit date: 1986    Years since quitting: 36.0  . Smokeless tobacco: Never Used  . Tobacco comment: quit smoking 1986 ish  Vaping Use  . Vaping Use: Never used  Substance and Sexual Activity  . Alcohol use: Yes    Alcohol/week: 21.0 standard drinks    Types: 21 Standard drinks or equivalent per week    Comment: 2-3 beers per night  . Drug use: No  . Sexual activity: Not on file  Other Topics Concern  . Not on file  Social History Narrative   Lives in [place].  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 Heart disease Mother   . Stroke Father   . Other Father        brain tumor-benign  . Heart disease Sister   . Heart disease Sister   . Hypertension Sister   . Colon cancer Neg Hx   . Stomach cancer Neg Hx   . Esophageal cancer Neg Hx   . Pancreatic cancer Neg Hx   . Liver disease Neg Hx      ROS:  Please see the history of present illness.  All other ROS reviewed and negative.     Physical Exam/Data:   Vitals:   03/02/20 2343 03/03/20 0030 03/03/20 0045 03/03/20 0100  BP: (!) 174/80 140/73 138/69 (!) 144/70  Pulse: 83 77 77 75  Resp: 16 13 18 15   Temp: 98.4 F (36.9 C)     TempSrc: Oral     SpO2: 97% 95% 94% 96%    Intake/Output Summary (Last 24 hours) at 03/03/2020 0148 Last data filed at 03/03/2020 0000 Gross per 24 hour  Intake --  Output 200 ml  Net -200 ml   Last 3 Weights 03/02/2020 02/22/2020 02/16/2020  Weight (lbs) 210 lb 210 lb 210 lb  Weight (kg) 95.255 kg 95.255 kg  95.255 kg     There is no height or weight on file to calculate BMI.  General:  Well nourished, well developed, in no acute distress HEENT: normal Lymph: no adenopathy Neck: no JVD Endocrine:  No thryomegaly Vascular: No carotid bruits; FA pulses 2+ bilaterally. No bruits.   Cardiac:  normal S1, S2; RRR; no murmur  Lungs:  clear to auscultation bilaterally, no wheezing, rhonchi or rales  Abd: soft, nontender, no hepatomegaly  Ext: Right leg (particularly the thigh) is swollen compared to the left, with accompanying skin tightness of the lateral thigh. There is no pitting edema. There is very minor bruising at the puncture site. The skin immediately surrounding the puncture site is soft without induration or significant swelling. There is no bruit.  Musculoskeletal:  No deformities, BUE and BLE strength normal and equal Skin: warm and dry  Neuro:  CNs 2-12 intact, no focal abnormalities noted Psych:  Normal affect   EKG:  The EKG was personally reviewed and demonstrates:  Sinus bradycardia with incomplete left bunch branch block and early repolarization most prominent in the inferior leads. The morphology is unchanged from priors.  Telemetry:  Telemetry was personally reviewed and demonstrates:  Sinus rhythm   Relevant CV Studies:  Transthoracic echocardiogram 02/17/2020 1. Left ventricular ejection fraction, by estimation, is 60 to 65%. The  left ventricle has normal function. The left ventricle has no regional  wall motion abnormalities. There is mild concentric left ventricular  hypertrophy of the basal-septal segment.  Left ventricular diastolic parameters are indeterminate.  2. Right ventricular systolic function is normal. The right ventricular  size is normal.  3. Left atrial size was moderately dilated.  4. Right atrial size was moderately dilated.  5. The mitral valve is normal in structure. Mild mitral valve  regurgitation.  6. The aortic valve is calcified. There is  mild calcification of the  aortic valve. There is mild thickening of the aortic valve. Aortic valve  regurgitation is mild to moderate. Mild aortic valve sclerosis is present,  with no evidence of aortic valve  stenosis.  7. The inferior vena cava is normal in size with greater than 50%  respiratory variability, suggesting right atrial pressure of 3 mmHg.   Laboratory Data:  High Sensitivity Troponin:  No results for input(s): TROPONINIHS in the last 720 hours.   ChemistryNo results for input(s): NA, K, CL, CO2, GLUCOSE, BUN, CREATININE, CALCIUM, GFRNONAA, GFRAA, ANIONGAP in the last 168 hours.  No results for input(s): PROT, ALBUMIN, AST, ALT, ALKPHOS, BILITOT in the last 168 hours. Hematology Recent Labs  Lab 03/03/20 0031  WBC 10.0  RBC 4.72  HGB 14.9  HCT 44.5  MCV 94.3  MCH 31.6  MCHC 33.5  RDW 13.3  PLT 183   BNPNo results for input(s): BNP, PROBNP in the last 168 hours.  DDimer No results for input(s): DDIMER in the last 168 hours.  Radiology/Studies:  EP STUDY  Result Date: 03-19-2020 SURGEON:  [name], MD  PREPROCEDURE DIAGNOSES: 1. Persistent atrial fibrillation. 2. Atrial flutter  POSTPROCEDURE DIAGNOSES: 1. Persistent atrial fibrillation. 2. Isthmus dependant right atrial flutter 3. Multiple left atrial flutter circuits  PROCEDURES: 1. Comprehensive electrophysiologic study. 2. Coronary sinus pacing and recording. 3. Three-dimensional mapping of atrial fibrillation with additional mapping and ablation of a second descrete arrhythmia (right atrial flutter) as well as additional ablation within the left atrium for persistent afib 4. Ablation of atrial fibrillation with additional mapping and ablation of a second descrete arrhythmia (right atrial flutter) as well as additional ablation within the left atrium for persistent afib 5.  Intracardiac echocardiography. 6. Transseptal puncture of an intact septum. 7. Arrhythmia induction with pacing 8. External cardioversion.  9. Right femoral venous ultrasound guided access  INTRODUCTION:   Mario [name] is a 80 y.o. male  with a history of persistent atrial fibrillation and typical appearing atrial flutter who now presents for EP study and radiofrequency ablation.  The patient reports initially being diagnosed with atrial fibrillation after presenting with symptomatic palpitations and fatgiue.  The patient has failed medical therapy.  The patient therefore presents today for catheter ablation of atrial fibrillation and atrial flutter.  DESCRIPTION OF PROCEDURE:  Informed written consent was obtained, and the patient was brought to the electrophysiology lab in a fasting state.   The patient was adequately sedated with intravenous medications as outlined in the anesthesia report.  The patient's left and right groins were prepped and draped in the usual sterile fashion by the EP lab staff.  Using a percutaneous Seldinger technique, two 7-French and one 9-French hemostasis sheaths were placed into the right common femoral vein.   Direct Ultrasound guidance was used today for right common femoral venous access with normal vessel patency.  Ultrasound images are captured and stored in the patients chart.  Catheter Placement:  A 7-French [employer] Webster Decapolar coronary sinus catheter was introduced through the right common femoral vein and advanced into the coronary sinus for recording and pacing from this location.  A 7 F [employer] Webster SmartTouch SF ablation catheter was introduced through the right common femoral vein and advanced into the right ventricle for recording and pacing.  This catheter was then pulled back to the His bundle location.  A luminal esophageal temperature probe was placed and used for continuous monitoring of the luminal esophageal temperature throughout the procedure as well as to localize the esophagus on fluoroscopy. In addition, the esophagus was directly visualized with intracardiac echo and its  positioned  marked on Carto.  During ablation at the posterior wall there was limited esophageal heating noted during RF energy delivery with the maximal temperature recorded by the luminal temperature probe of < 37.5 degrees C. Initial Measurements: The patient presented to the electrophysiology lab in sinus rhythm.  The patients average RR interval measured 1009 msec.  The PR interval was 199 msec with QRS 114 msec and Qt 409 msec.  The AH interval was 90 msec and the HV measured 43 msec.  Ventricular pacing revealed midline concentric and decremental VA conduction with VAWCL of 390 msec. Atrial pacing revealed AVWCL of 820 msec.  Intracardiac Echocardiography: An 8-French [employer] Webster AcuNav intracardiac echocardiography catheter was introduced through the right common femoral vein and advanced into the right atrium. Intracardiac echocardiography was performed of the left atrium, and a three-dimensional anatomical rendering of the left atrium was performed using CARTO sound technology.  The patient was noted to have a moderately enlarged  sized left atrium.  The interatrial septum was noted to not have a PFO. All 4 pulmonary veins were visualized and were noted to have separate ostia.  The pulmonary veins were moderate in size.  The left atrial appendage was visualized and did not reveal thrombus.   There was no evidence of pulmonary vein stenosis.  Transseptal Puncture: The middle right common femoral vein sheath was exchanged for an 8.5 [place] SL2 transseptal sheath and transseptal access was achieved in a standard fashion using a Baylis needle with intracardiac echocardiography confirmation of the transseptal puncture.  Once transseptal access had been achieved, heparin was administered intravenously and intra- arterially in order to maintain an ACT of greater than 350 seconds throughout the procedure.  3D Mapping and Ablation: The His bundle catheter was removed and in its place a 3.5 mm [employer]  Thermocool SF ablation catheter was advanced into the right atrium.  The transseptal sheath was pulled back into the IVC over a guidewire.  The ablation catheter was advanced across the transseptal hole using the wire as a guide.  The transseptal sheath was then re-advanced over the guidewire into the left atrium.  A duodecapolar Biosense Webster pentaray mapping catheter was introduced through the transseptal sheath and positioned over the mouth of all 4 pulmonary veins.  Three-dimensional electroanatomical mapping was performed using CARTO technology.  This demonstrated electrical activity within all four pulmonary veins at baseline. The patient underwent successful sequential electrical isolation and anatomical encircling of all four pulmonary veins using radiofrequency current with a pentaray mapping catheter as a guide. A WACA approach was used.  [employer] was used.  The target Ablation Index was 380-400 on the posterior wall, 500-550 on the anterior wall of the left atrium.  RF was delivered at 50 watts for all lesions.  The patient developed atrial fibrillation with catheter manipulation. Additional 3D mapping and ablation within the left atrium due to persistent afib: Due to persistence of atrial fibrillation, additional left atrial 3D mapping and ablation was performed.  A series of radiofrequency lesions were delivered along the roof and floor of the left atrium in order to create a "standard box" lesion along the posterior wall of the left atrium.  Atrial fibrillation organized into atrial flutter with CL of 340 msec with ablation.   Additional 3D mapping and ablation of a second descrite focus: The atrial flutter CL was 340 msec with proximal to distal CS activation.  entrainment from the left  Atrium revealed a long PPI.  With entrainment from the CTI, the PPI=TCL.  I therefore elected to perform CTI ablation today. The ablation catheter was then pulled back into the right atrial and  positioned along the cavo-tricuspid isthmus.  3D Mapping along the atrial side of the isthmus was performed.  This demonstrated a standard isthmus.  A series of radiofrequency applications were then delivered along the isthmus.  During ablation, the typical appearing atrial flutter converted to a second atrial flutter with midline CS activation and a TCL of 340 msec.  Finally a 3rd atrial flutter was observed with eccentric activation and CL of 340 msec.  These atrial flutters were not stable and therefore not amenable to ablation today.  I therefore elected to perform cardioversion. Cardioversion: The patient was then cardioverted to sinus rhythm with a single synchronized 360-J biphasic shock with cardioversion electrodes in the anterior-posterior thoracic configuration.  He remained in sinus rhythm thereafter. Complete bidirectional cavotricuspid isthmus block was achieved as confirmed by differential atrial pacing from the low lateral right atrium.  A stimulus to earliest atrial activation across the isthmus measured 190 msec bi-directionally.  The patient was observe without return of conduction through the isthmus.  Measurements Following Ablation: In sinus rhythm with RR interval was 1096, with PR 186 msec, QRS 104  msec, and Qt 422 msec.  Following ablation the AH interval measured 130 msec with an HV interval of 45 msec.     Atrial pacing revealed AVWCL of 480 msec. No arrhythmias were induced. Electroisolation was then again confirmed in all four pulmonary veins.  Entrance and exit block were demonstrated.  The procedure was therefore considered completed.  Intracardiac echocardiography was performed which revealed no pericardial effusion at the end of the procedure.  All catheters were removed, and the sheaths were aspirated and flushed. Sheaths were removed and perclose stitch x 3 applied.  The patient was transferred to the recovery area for sheath removal per protocol.  EBL<66ml.   There were no early  apparent complications.  CONCLUSIONS: 1. Sinus rhythm upon presentation.  2. Intracardiac echo reveals a moderate to large sized left atrium. 3. Successful electrical isolation and anatomical encircling of all four pulmonary veins with radiofrequency current. 4. Additional mapping and ablation within the left atrium due to persistence of atrial fibrillation with a posterior wall box demonstrated 5. Cavo-tricuspid isthmus ablation performed with complete bidirectional isthmus block achieved 6. Multiple atypical left atrial flutter circuits demonstrated but not suitable for 3D mapping or ablation today 7. Atypical Atrial flutter successfully cardioverted to sinus rhythm. 8. No early apparent complications. [name] MD, [facility] 03/02/2020 1:03 PM    Assessment and Plan:   Presents for evaluation of unilateral leg swelling the evening after undergoing a catheter ablation procedure with femoral venous access. No pitting edema suggestive of heart failure symptoms. He had no arterial access and no bruit on exam, so pseudoaneurysm is unlikely.  He has very little bruising which suggests against large hematoma from venous bleed, though does not entirely rule it out.  He does have a small drop in hemoglobin, which could just as easily be post-procedural versus early evidence of bleeding. His exam would certainly be consistent with a DVT, however this seems far less likely as he is on oral anticoagulation and received heparin during the procedure. Given the timing of this finding less than 24 hours after venous access, I recommend CT scan to evaluate for hematoma.   1. Leg swelling  - Contrasted CT of the abdomen and leg to  evaluate for hematoma and for active extravasation.  - He will require pre-medication for contrast allergy.   2. Atrial fibrillation status post catheter ablation.  -Mr. Forys is in sinus rhythm currently -Continue apixaban 5 mg twice daily. CHAD2S-VASc score is 3   Risk Assessment/Risk  Scores:   CHA2DS2-VASc Score = 3  This indicates a 3.2% annual risk of stroke. The patient's score is based upon: CHF History: No HTN History: Yes Diabetes History: No Stroke History: No Vascular Disease History: No Age Score: 2 Gender Score: 0     For questions or updates, please contact [place] Please consult [email or web address] for contact info under   Signed, Osvaldo Shipper, MD  03/03/2020 1:48 AM

## 2020-03-03 NOTE — ED Notes (Signed)
Spoke with CT, they plan on scanning patient after 0700

## 2020-03-03 NOTE — ED Provider Notes (Signed)
[facility] EMERGENCY DEPARTMENT Provider Note   CSN: [id number] Arrival date & time: 03/02/20  2336     History Chief Complaint  Patient presents with  . Groin Swelling  . Post-op Problem    Mario [name] is a 80 y.o. male.  80 yo M who had a cardiac ablation done earlier today. Had some right leg swelling before he left the hospital. Apparently he went home and swelling got worse. No real pain but the swelling was concerning so called EMS and came back. No sob.         Past Medical History:  Diagnosis Date  . Aortic insufficiency   . Arthritis    neck  . History of kidney stones   . Hypertension   . Melanoma ([place]) 2000   back; stage 3, squamous 1 - basal- several  . OSA (obstructive sleep apnea)    uses CPAP  . Persistent atrial fibrillation (HCC)    afib - cardioverted 01/2015  . Phlebitis   . Pneumonia   . Seasonal allergies   . Tubular adenoma of colon 2013   colonoscpy in 5 years [name]    Patient Active Problem List   Diagnosis Date Noted  . Secondary hypercoagulable state ([place]) 02/15/2020  . Atypical chest pain   . Near syncope   . Swelling of both ankles 07/02/2015  . 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 OSA on CPAP 01/12/2015  . Anticoagulation adequate 11/03/2014  . Atrial fibrillation ([place]) 09/29/2014  . History of colonic polyps 09/20/2014  . 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 Stricture and stenosis of esophagus 06/20/2008  . 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 HYPERTENSION, BENIGN 05/06/2006  . ELEVATED PROSTATE SPECIFIC ANTIGEN 05/06/2006  . Melanoma ([place]) 04/10/2006  . CATARACT 04/10/2006  . Diverticulitis of colon 04/10/2006  . NEPHROLITHIASIS 04/10/2006    Past Surgical History:  Procedure Laterality Date  . 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 CARDIOVERSION N/A 12/01/2014   Procedure:  CARDIOVERSION;  Surgeon: Troy Sine, MD;  Location: [facility] ENDOSCOPY;  Service: Cardiovascular;  Laterality: N/A;  . CARDIOVERSION N/A 02/22/2020   Procedure: CARDIOVERSION;  Surgeon: Buford Dresser, MD;  Location: [facility] ENDOSCOPY;  Service: Cardiovascular;  Laterality: N/A;  . CATARACT EXTRACTION  2002, 2004   bilateral  . CYSTOSCOPY    . ELBOW SURGERY Left 1984   tendon repair left arm  . INCISION AND DRAINAGE OF WOUND Left 08/28/2016   Procedure: IRRIGATION AND DEBRIDEMENT WOUND;  Surgeon: Iran Planas, MD;  Location: [place];  Service: Orthopedics;  Laterality: Left;  . LITHOTRIPSY    . melanoma removal  2000   back  . METACARPAL OSTEOTOMY Left 08/28/2016   Procedure: 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.;  Surgeon: Iran Planas, MD;  Location: [place];  Service: Orthopedics;  Laterality: Left;  [name] MICROLARYNGOSCOPY WITH LASER N/A 01/25/2015   Procedure: MICROLARYNGOSCOPY ;  Surgeon: Melida Quitter, MD;  Location: [place];  Service: ENT;  Laterality: N/A;  Microlaryngoscopy with 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 with CO2 laser   . TENDON REPAIR Right 2012   right  . TONSILLECTOMY    . UPPER GI ENDOSCOPY         Family History  Problem Relation Age of Onset  . Heart disease Mother   . Stroke Father   . Other Father  brain tumor-benign  . Heart disease Sister   . Heart disease Sister   . Hypertension Sister   . Colon cancer Neg Hx   . Stomach cancer Neg Hx   . Esophageal cancer Neg Hx   . Pancreatic cancer Neg Hx   . Liver disease Neg Hx     Social History   Tobacco Use  . Smoking status: Former Smoker    Start date: 11/20/1976    Quit date: 1986    Years since quitting: 36.0  . Smokeless tobacco: Never Used  . Tobacco comment: quit smoking 1986 ish  Vaping Use  . Vaping Use: Never used  Substance Use Topics  . Alcohol use: Yes    Alcohol/week: 21.0 standard drinks    Types: 21 Standard drinks or equivalent per week    Comment: 2-3 beers per  night  . Drug use: No    Home Medications Prior to Admission medications   Medication Sig Start Date End Date Taking? Authorizing Provider  amLODipine (NORVASC) 10 MG tablet Take 1 tablet (10 mg total) by mouth daily. 05/03/19   Lendon Colonel, NP  carvedilol (COREG) 3.125 MG tablet Take 3.125 mg by mouth 2 (two) times daily with a meal.    [provider]  carvedilol (COREG) 6.25 MG tablet Take 1 tablet (6.25 mg total) by mouth 2 (two) times daily. 12/28/19   Troy Sine, MD  Cholecalciferol (DIALYVITE VITAMIN D 5000) 125 MCG (5000 UT) capsule Take 5,000 Units by mouth daily.    [provider]  ELIQUIS 5 MG TABS tablet Take 1 tablet by mouth twice daily Patient taking differently: Take 5 mg by mouth 2 (two) times daily. 05/24/19   Almyra Deforest, PA  flecainide (TAMBOCOR) 100 MG tablet Take 1 tablet (100 mg total) by mouth 2 (two) times daily. 05/03/19   Lendon Colonel, NP  GLUCOSAMINE-CHONDROITIN PO Take 1 tablet by mouth daily.     [provider]  hydrALAZINE (APRESOLINE) 25 MG tablet Take 1 tablet (25 mg total) by mouth 2 (two) times daily. Please schedule annual appt for refills. [phone]. 05/03/19   Lendon Colonel, NP  losartan (COZAAR) 100 MG tablet Take 1 tablet (100 mg total) by mouth daily. 05/03/19   Lendon Colonel, NP  Lutein 20 MG CAPS Take 20 mg by mouth 3 (three) times a week.    [provider]  Multiple Vitamin (MULTIVITAMIN) tablet Take 1 tablet by mouth daily.    [provider]  pantoprazole (PROTONIX) 40 MG tablet Take 1 tablet (40 mg total) by mouth daily. 03/02/20 04/16/20  Allred, Jeneen Rinks, MD  Saw Palmetto, Serenoa repens, (SAW PALMETTO PO) Take 1 tablet by mouth daily.    [provider]  testosterone cypionate (DEPOTESTOSTERONE CYPIONATE) 200 MG/ML injection Inject 100 mg into the muscle every Wednesday. 08/09/14   [provider]  triamcinolone (KENALOG) 0.1 % Apply 1 application topically 2  (two) times daily as needed (red bumps). 01/21/20   [provider]    Allergies    Contrast media [iodinated diagnostic agents] and Lisinopril  Review of Systems   Review of Systems  All other systems reviewed and are negative.   Physical Exam Updated Vital Signs BP (!) 174/80 (BP Location: Right Arm)   Pulse 83   Temp 98.4 F (36.9 C) (Oral)   Resp 16   SpO2 97%   Physical Exam Vitals and nursing note reviewed.  Constitutional:      Appearance: He  is well-developed and well-nourished.  HENT:     Head: Normocephalic and atraumatic.     Mouth/Throat:     Mouth: Mucous membranes are moist.     Pharynx: Oropharynx is clear.  Cardiovascular:     Rate and Rhythm: Normal rate.  Pulmonary:     Effort: Pulmonary effort is normal. No respiratory distress.  Abdominal:     General: Abdomen is flat. There is no distension.  Musculoskeletal:        General: Normal range of motion.     Cervical back: Normal range of motion.     Right lower leg: Edema present.     Left lower leg: No edema.  Skin:    General: Skin is warm and dry.     Coloration: Skin is not jaundiced or pale.  Neurological:     General: No focal deficit present.     Mental Status: He is alert.     ED Results / Procedures / Treatments   Labs (all labs ordered are listed, but only abnormal results are displayed) Labs Reviewed  CBC WITH DIFFERENTIAL/PLATELET  BASIC METABOLIC PANEL    EKG None  Radiology EP STUDY  Result Date: 03/02/2020 SURGEON:  [name], MD  PREPROCEDURE DIAGNOSES: 1. Persistent atrial fibrillation. 2. Atrial flutter  POSTPROCEDURE DIAGNOSES: 1. Persistent atrial fibrillation. 2. Isthmus dependant right atrial flutter 3. Multiple left atrial flutter circuits  PROCEDURES: 1. Comprehensive electrophysiologic study. 2. Coronary sinus pacing and recording. 3. Three-dimensional mapping of atrial fibrillation with additional mapping and ablation of a second descrete arrhythmia  (right atrial flutter) as well as additional ablation within the left atrium for persistent afib 4. Ablation of atrial fibrillation with additional mapping and ablation of a second descrete arrhythmia (right atrial flutter) as well as additional ablation within the left atrium for persistent afib 5.  Intracardiac echocardiography. 6. Transseptal puncture of an intact septum. 7. Arrhythmia induction with pacing 8. External cardioversion. 9. Right femoral venous ultrasound guided access  INTRODUCTION:   Shermon Edwardsen is a 80 y.o. male  with a history of persistent atrial fibrillation and typical appearing atrial flutter who now presents for EP study and radiofrequency ablation.  The patient reports initially being diagnosed with atrial fibrillation after presenting with symptomatic palpitations and fatgiue.  The patient has failed medical therapy.  The patient therefore presents today for catheter ablation of atrial fibrillation and atrial flutter.  DESCRIPTION OF PROCEDURE:  Informed written consent was obtained, and the patient was brought to the electrophysiology lab in a fasting state.   The patient was adequately sedated with intravenous medications as outlined in the anesthesia report.  The patient's left and right groins were prepped and draped in the usual sterile fashion by the EP lab staff.  Using a percutaneous Seldinger technique, two 7-French and one 9-French hemostasis sheaths were placed into the right common femoral vein.   Direct Ultrasound guidance was used today for right common femoral venous access with normal vessel patency.  Ultrasound images are captured and stored in the patients chart.  Catheter Placement:  A 7-French Biosense Webster Decapolar coronary sinus catheter was introduced through the right common femoral vein and advanced into the coronary sinus for recording and pacing from this location.  A 7 F Biosense Webster SmartTouch SF ablation catheter was introduced through the right common  femoral vein and advanced into the right ventricle for recording and pacing.  This catheter was then pulled back to the His bundle location.  A luminal esophageal  temperature probe was placed and used for continuous monitoring of the luminal esophageal temperature throughout the procedure as well as to localize the esophagus on fluoroscopy. In addition, the esophagus was directly visualized with intracardiac echo and its positioned marked on Carto.  During ablation at the posterior wall there was limited esophageal heating noted during RF energy delivery with the maximal temperature recorded by the luminal temperature probe of < 37.5 degrees C. Initial Measurements: The patient presented to the electrophysiology lab in sinus rhythm.  The patients average RR interval measured 1009 msec.  The PR interval was 199 msec with QRS 114 msec and Qt 409 msec.  The AH interval was 90 msec and the HV measured 43 msec.  Ventricular pacing revealed midline concentric and decremental VA conduction with VAWCL of 390 msec. Atrial pacing revealed AVWCL of 820 msec.  Intracardiac Echocardiography: An 8-French Biosense Webster AcuNav intracardiac echocardiography catheter was introduced through the right common femoral vein and advanced into the right atrium. Intracardiac echocardiography was performed of the left atrium, and a three-dimensional anatomical rendering of the left atrium was performed using CARTO sound technology.  The patient was noted to have a moderately enlarged  sized left atrium.  The interatrial septum was noted to not have a PFO. All 4 pulmonary veins were visualized and were noted to have separate ostia.  The pulmonary veins were moderate in size.  The left atrial appendage was visualized and did not reveal thrombus.   There was no evidence of pulmonary vein stenosis.  Transseptal Puncture: The middle right common femoral vein sheath was exchanged for an 8.5 [place] SL2 transseptal sheath and transseptal access  was achieved in a standard fashion using a Baylis needle with intracardiac echocardiography confirmation of the transseptal puncture.  Once transseptal access had been achieved, heparin was administered intravenously and intra- arterially in order to maintain an ACT of greater than 350 seconds throughout the procedure.  3D Mapping and Ablation: The His bundle catheter was removed and in its place a 3.5 mm [employer] Thermocool SF ablation catheter was advanced into the right atrium.  The transseptal sheath was pulled back into the IVC over a guidewire.  The ablation catheter was advanced across the transseptal hole using the wire as a guide.  The transseptal sheath was then re-advanced over the guidewire into the left atrium.  A duodecapolar Biosense Webster pentaray mapping catheter was introduced through the transseptal sheath and positioned over the mouth of all 4 pulmonary veins.  Three-dimensional electroanatomical mapping was performed using CARTO technology.  This demonstrated electrical activity within all four pulmonary veins at baseline. The patient underwent successful sequential electrical isolation and anatomical encircling of all four pulmonary veins using radiofrequency current with a pentaray mapping catheter as a guide. A WACA approach was used.  Sure [employer] was used.  The target Ablation Index was 380-400 on the posterior wall, 500-550 on the anterior wall of the left atrium.  RF was delivered at 50 watts for all lesions.  The patient developed atrial fibrillation with catheter manipulation. Additional 3D mapping and ablation within the left atrium due to persistent afib: Due to persistence of atrial fibrillation, additional left atrial 3D mapping and ablation was performed.  A series of radiofrequency lesions were delivered along the roof and floor of the left atrium in order to create a "standard box" lesion along the posterior wall of the left atrium.  Atrial  fibrillation organized into atrial flutter with CL of 340 msec  with ablation.   Additional 3D mapping and ablation of a second descrite focus: The atrial flutter CL was 340 msec with proximal to distal CS activation.  entrainment from the left  Atrium revealed a long PPI.  With entrainment from the CTI, the PPI=TCL.  I therefore elected to perform CTI ablation today. The ablation catheter was then pulled back into the right atrial and positioned along the cavo-tricuspid isthmus.  3D Mapping along the atrial side of the isthmus was performed.  This demonstrated a standard isthmus.  A series of radiofrequency applications were then delivered along the isthmus.  During ablation, the typical appearing atrial flutter converted to a second atrial flutter with midline CS activation and a TCL of 340 msec.  Finally a 3rd atrial flutter was observed with eccentric activation and CL of 340 msec.  These atrial flutters were not stable and therefore not amenable to ablation today.  I therefore elected to perform cardioversion. Cardioversion: The patient was then cardioverted to sinus rhythm with a single synchronized 360-J biphasic shock with cardioversion electrodes in the anterior-posterior thoracic configuration.  He remained in sinus rhythm thereafter. Complete bidirectional cavotricuspid isthmus block was achieved as confirmed by differential atrial pacing from the low lateral right atrium.  A stimulus to earliest atrial activation across the isthmus measured 190 msec bi-directionally.  The patient was observe without return of conduction through the isthmus.  Measurements Following Ablation: In sinus rhythm with RR interval was 1096, with PR 186 msec, QRS 104  msec, and Qt 422 msec.  Following ablation the AH interval measured 130 msec with an HV interval of 45 msec.     Atrial pacing revealed AVWCL of 480 msec. No arrhythmias were induced. Electroisolation was then again confirmed in all four pulmonary veins.  Entrance  and exit block were demonstrated.  The procedure was therefore considered completed.  Intracardiac echocardiography was performed which revealed no pericardial effusion at the end of the procedure.  All catheters were removed, and the sheaths were aspirated and flushed. Sheaths were removed and perclose stitch x 3 applied.  The patient was transferred to the recovery area for sheath removal per protocol.  EBL<41ml.   There were no early apparent complications.  CONCLUSIONS: 1. Sinus rhythm upon presentation.  2. Intracardiac echo reveals a moderate to large sized left atrium. 3. Successful electrical isolation and anatomical encircling of all four pulmonary veins with radiofrequency current. 4. Additional mapping and ablation within the left atrium due to persistence of atrial fibrillation with a posterior wall box demonstrated 5. Cavo-tricuspid isthmus ablation performed with complete bidirectional isthmus block achieved 6. Multiple atypical left atrial flutter circuits demonstrated but not suitable for 3D mapping or ablation today 7. Atypical Atrial flutter successfully cardioverted to sinus rhythm. 8. No early apparent complications. [name] MD, [facility] 03/02/2020 1:03 PM    Procedures Procedures (including critical care time)  Medications Ordered in ED Medications  lactated ringers bolus 1,000 mL (has no administration in time range)    ED Course  I have reviewed the triage vital signs and the nursing notes.  Pertinent labs & imaging results that were available during my care of the patient were reviewed by me and considered in my medical decision making (see chart for details).    MDM Rules/Calculators/A&P                          Complication form ablation. Possibly localized swelling, but also could be provoked DVT/ongoing  blood loss. Good DP pulses bilaterally, no indication for angiogram.  Discussed Dr. Launa Grill with cardiology, after exam she suggests premedication and ct w/ contrast to  eval for active bleeding. Ordered.   Apparently cardiology has reexamined patient and canceled the ct scan. Pending reevaluation for final recommendations. Care transferred to Dr. Tamera Punt pending the same.   Final Clinical Impression(s) / ED Diagnoses Final diagnoses:  None    Rx / DC Orders ED Discharge Orders    None       Shaton Lore, Corene Cornea, MD 03/04/20 [phone]

## 2020-03-03 NOTE — ED Provider Notes (Signed)
Care was taken over from Dr. Dayna Barker.  Patient had a recent cardiac ablation.  It was reportedly a venous procedure.  Last night he noticed some increased swelling of the leg.  There is some very minimal ecchymosis.  Case was discussed with cardiology and initially the plan was to do a CT of the leg to rule out extravasation of blood and if this was negative to do a Doppler ultrasound to rule out DVT.  Dr. Rayann Heman has evaluated the patient and feels that these tests are not needed.  The swelling has actually improved since being in the emergency department.  It is felt that this is likely postop swelling.  Dr. Rayann Heman will follow the patient in the office on Monday for recheck.  The patient also has his cell phone number if there is any problems over the weekend.  Return precautions were given.   Mario Johns, MD 03/03/20 [phone]

## 2020-03-03 NOTE — ED Notes (Signed)
Patient verbalizes understanding of discharge instructions. Opportunity for questioning and answers were provided. Arm band removed by staff, patient discharged from ED. 

## 2020-03-03 NOTE — ED Notes (Signed)
Cardiology at bedside.

## 2020-03-03 NOTE — Consult Note (Addendum)
Cardiology Consultation:   Patient ID: Mario [name] MRN: [id number]; DOB: 07/30/1940  Admit date: 03/02/2020 Date of Consult: 03/03/2020  Primary Care Provider: Marrian [name], [facility] Cardiologist: Mario Majestic, MD  [facility] Electrophysiologist:  Mario [name]   Patient Profile:   Mario [name] is a 80 y.o. male with a hx of HTN, remote melanoma, PACs, PVCs, AFib who is being seen today for the evaluation of RLE swelling post ablation at the request of Mario [name].  History of Present Illness:   Mario [name] underwent PVI ablation yesterday, had usual and uncomplicated immediate post-procedure course and was discharged to home once bed rest was completed.   Once home he noted that his RLE seemed to feel a bit tight, he evaluated his leg and felt like his groin seemed swollen and when he looked closer noted his entire leg seemed to be "twice the size of the left".  He had no pain, though when walking felt his leg swollen/somewhat tight.  No groin pain, no belly of back pain, no N/V.  He has perhaps a slight ache in his chest.  No SOB.  He came to the ER worried about his leg. He took his Eliquis last evening prior to coming in.  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   ATRIAL FIBRILLATION ABLATION N/A 03/02/2020   Procedure: ATRIAL FIBRILLATION ABLATION;  Surgeon:  Mario Grayer, MD;  Location: [place] [facility];  Service: Cardiovascular;  Laterality: N/A;   BASAL CELL CARCINOMA EXCISION  02/09/2017   CARDIOVERSION N/A 12/01/2014   Procedure: CARDIOVERSION;  Surgeon: Mario Sine, MD;  Location: [place];  Service: Cardiovascular;  Laterality: N/A;   CARDIOVERSION N/A 02/22/2020   Procedure: CARDIOVERSION;  Surgeon: Mario Dresser, MD;  Location: [facility];  Service: Cardiovascular;  Laterality: N/A;   CATARACT EXTRACTION  2002, 2004   bilateral   CYSTOSCOPY     ELBOW SURGERY Left 1984   tendon repair left arm   INCISION AND DRAINAGE OF WOUND Left 08/28/2016   Procedure: IRRIGATION AND DEBRIDEMENT WOUND;  Surgeon: Iran Planas, MD;  Location: [place];  Service: Orthopedics;  Laterality: Left;   LITHOTRIPSY     melanoma removal  2000   back   METACARPAL OSTEOTOMY Left 08/28/2016   Procedure: 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.;  Surgeon: Iran Planas, MD;  Location: [place];  Service: Orthopedics;  Laterality: Left;   MICROLARYNGOSCOPY WITH LASER N/A 01/25/2015   Procedure: MICROLARYNGOSCOPY ;  Surgeon: Melida Quitter, MD;  Location: [place];  Service: ENT;  Laterality: N/A;  Microlaryngoscopy with 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 Authorizing Provider  amLODipine (NORVASC) 10 MG tablet Take 1 tablet (10 mg total) by mouth daily. 05/03/19  Yes Mario Colonel, NP  carvedilol (COREG) 3.125 MG tablet Take 3.125 mg by mouth 2 (two) times daily with a meal.   Yes [provider]  carvedilol (COREG) 6.25 MG tablet Take 1 tablet (6.25 mg total) by mouth 2 (two) times daily. 12/28/19  Yes Mario Sine, MD  Cholecalciferol (DIALYVITE VITAMIN D 5000) 125 MCG (5000 UT) capsule Take 5,000 Units by mouth daily.   Yes  [provider]  ELIQUIS 5 MG TABS tablet Take 1 tablet by mouth twice daily Patient taking differently: Take 5 mg by mouth 2 (two) times daily. 05/24/19  Yes Mario Deforest, PA  flecainide (TAMBOCOR) 100 MG tablet Take 1 tablet (100 mg total) by mouth 2 (two) times daily. 05/03/19  Yes Mario Colonel, NP  GLUCOSAMINE-CHONDROITIN PO Take 1 tablet by mouth daily.    Yes [provider]  hydrALAZINE (APRESOLINE) 25 MG tablet Take 1 tablet (25 mg total) by mouth 2 (two) times daily. Please schedule annual appt for refills. [phone]. 05/03/19  Yes Mario Colonel, NP  losartan (COZAAR) 100 MG tablet Take 1 tablet (100 mg total) by mouth daily. 05/03/19  Yes Mario Colonel, NP  Lutein 20 MG CAPS Take 20 mg by mouth daily.   Yes [provider]  Multiple Vitamin (MULTIVITAMIN) tablet Take 1 tablet by mouth daily.   Yes [provider]  OVER THE COUNTER MEDICATION Place 4-5 drops into both eyes as needed (dry eyes). Thera tears   Yes [provider]  pantoprazole (PROTONIX) 40 MG tablet Take 1 tablet (40 mg total) by mouth daily. 03/02/20 04/16/20 Yes Mario [name], Mario Rinks, MD  Saw Palmetto, Serenoa repens, (SAW PALMETTO PO) Take 1 tablet by mouth daily.   Yes [provider]  testosterone cypionate (DEPOTESTOSTERONE CYPIONATE) 200 MG/ML injection Inject 100 mg into the muscle every Wednesday. 08/09/14  Yes [provider]  triamcinolone (KENALOG) 0.1 % Apply 1 application topically 2 (two) times daily as needed (red bumps). 01/21/20  Yes [provider]    Inpatient Medications: Scheduled Meds:  Continuous Infusions:  PRN Meds:   Allergies:    Allergies  Allergen Reactions   Contrast Media [Iodinated Diagnostic Agents] Shortness Of Breath, Rash and Other (See Comments)    First noted after Coronary CTA 02/24/20. Pt experienced wheezing, shortness of breath and diffuse rash/welts over face and trunk.   Lisinopril Other (See  Comments)    REACTION: severe GI upset requiring EGD--temporally related to lisinopril--tolerated micardis    Social History:   Social History   Socioeconomic History   Marital status: Married    Spouse name: Not on file   Number of children: 2   Years of education: Not on file   Highest education level: Not on file  Occupational History   Occupation: retired  Tobacco Use   Smoking status: Former Smoker    Start date: 11/20/1976    Quit date: 1986    Years since quitting: 36.0   Smokeless tobacco: Never Used   Tobacco comment: quit smoking 1986 ish  Vaping Use   Vaping Use: Never used  Substance and Sexual Activity   Alcohol use: Yes    Alcohol/week: 21.0 standard drinks    Types: 21 Standard drinks or equivalent per week    Comment:  2-3 beers per night   Drug use: No   Sexual activity: Not on file  Other Topics Concern   Not on file  Social History Narrative   Lives in [place].  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   Social Determinants of Health   Financial Resource Strain: Not on file  Food Insecurity: Not on file  Transportation Needs: Not on file  Physical Activity: Not on file  Stress: Not on file  Social Connections: Not on file  Intimate Partner Violence: Not on file    Family History:   Family History  Problem Relation Age of Onset   Heart disease Mother    Stroke Father    Other Father        brain tumor-benign   Heart disease Sister    Heart disease Sister    Hypertension Sister    Colon cancer Neg Hx    Stomach cancer Neg Hx    Esophageal cancer Neg Hx    Pancreatic cancer Neg Hx    Liver disease Neg Hx      ROS:  Please see the history of present illness.  All other ROS reviewed and negative.     Physical Exam/Data:   Vitals:   03/03/20 0615 03/03/20 0630 03/03/20 0645 03/03/20 0700  BP: 118/63 134/69 124/64 135/76  Pulse: 75 80 78 80  Resp: 13 18 17 17   Temp:      TempSrc:       SpO2: 92% 93% 90% 95%    Intake/Output Summary (Last 24 hours) at 03/03/2020 0726 Last data filed at 03/03/2020 0200 Gross per 24 hour  Intake --  Output 800 ml  Net -800 ml   Last 3 Weights 03/02/2020 02/22/2020 02/16/2020  Weight (lbs) 210 lb 210 lb 210 lb  Weight (kg) 95.255 kg 95.255 kg 95.255 kg     There is no height or weight on file to calculate BMI.  General:  Well nourished, well developed, in no acute distress HEENT: normal Lymph: no adenopathy Neck: no JVD Endocrine:  No thryomegaly Vascular: No carotid bruits  Cardiac:  RRR; no murmurs or rubs, no gallops Lungs:  CTA b/l, no wheezing, rhonchi or rales  Abd: soft, nontender in all 4 quadrants to deep palpation Ext: RLE has generalized swelling to his ankle, minimal R groin ecchymosis, no bleeding, no hematoma.  he has no pain to palpation anywhere, I do not appreciate any firm areas to suggest hematoma, no bruising, no pain,  LLE unremarkable, Musculoskeletal:  No deformities, BUE and BLE strength normal and equal Skin: warm and dry  Neuro:  no focal abnormalities noted Psych:  Normal affect, is a bit anxious  EKG:  The EKG was personally reviewed and demonstrates:   No new EKGS Telemetry:  Telemetry was personally reviewed and demonstrates:    SR  Relevant CV Studies:   03/02/20: EPS/ABlation CONCLUSIONS: 1. Sinus rhythm upon presentation.  2. Intracardiac echo reveals a moderate to large sized left atrium. 3. Successful electrical isolation and anatomical encircling of all four pulmonary veins with radiofrequency current. 4. Additional mapping and ablation within the left atrium due to persistence of atrial fibrillation with a posterior wall box demonstrated  5. Cavo-tricuspid isthmus ablation performed with complete bidirectional isthmus block achieved 6. Multiple atypical left atrial flutter circuits demonstrated but not suitable for 3D mapping or ablation today 7. Atypical Atrial flutter successfully  cardioverted to sinus rhythm. 8. No early apparent complications.  Laboratory Data:  High Sensitivity Troponin:  No results for input(s): TROPONINIHS in the last 720 hours.   Chemistry Recent Labs  Lab 03/03/20 0031  NA 134*  K 4.6  CL 105  CO2 21*  GLUCOSE 121*  BUN 19  CREATININE 1.14  CALCIUM 8.8*  GFRNONAA >60  ANIONGAP 8    No results for input(s): PROT, ALBUMIN, AST, ALT, ALKPHOS, BILITOT in the last 168 hours. Hematology Recent Labs  Lab 03/03/20 0031  WBC 10.0  RBC 4.72  HGB 14.9  HCT 44.5  MCV 94.3  MCH 31.6  MCHC 33.5  RDW 13.3  PLT 183   BNPNo results for input(s): BNP, PROBNP in the last 168 hours.  DDimer No results for input(s): DDIMER in the last 168 hours.   Radiology/Studies:   Assessment and Plan:   1. RLE swelling s/p EPS/ablation yesterday     Very generalized RLE edema/swelling     + pedal pulses easily felt     Minimal superficial ecchymosis at R groin no bruising to RLE  Noted anywhere otherwise     No belly or flank pain/tenderness     No RLE pain to palpation anywhere  Does not strike me as a bleeding event, H/H stable as well In telephone d/w Mario [name], will cancel CT scan he will come in and see the patient. Impression is venous.  He has been on Eliquis without interruption including yesterday, also had some heparin during his procedure.  Will await Mario. Jackalyn Lombard evaluation for final recommendations.     Risk Assessment/Risk Scores:  { For questions or updates, please contact [place] Please consult [email or web address] for contact info under    Signed, Baldwin Jamaica, PA-C  03/03/2020 7:26 AM  I have seen, examined the patient, and reviewed the above assessment and plan.  Changes to above are made where necessary.  On exam, RRR.  Mild swelling of L leg.  2+ DP/PT pulses,  No modelling Minor ecchymosis/ small hematoma of R groin No bruit  Conservative management Follow-up with me next week.  Co Sign: Mario Grayer, MD 03/03/2020 3:21 PM

## 2020-03-03 NOTE — Anesthesia Postprocedure Evaluation (Signed)
Anesthesia Post Note  Patient: Mario [name]  Procedure(s) Performed: ATRIAL FIBRILLATION ABLATION (N/A )     Patient location during evaluation: PACU Anesthesia Type: General Level of consciousness: awake and alert Pain management: pain level controlled Vital Signs Assessment: post-procedure vital signs reviewed and stable Respiratory status: spontaneous breathing, nonlabored ventilation, respiratory function stable and patient connected to nasal cannula oxygen Cardiovascular status: blood pressure returned to baseline and stable Postop Assessment: no apparent nausea or vomiting Anesthetic complications: no   No complications documented.  Last Vitals:  Vitals:   03/02/20 1503 03/02/20 1533  BP: (!) 131/59 (!) 142/59  Pulse: 69 67  Resp: 16 15  Temp:    SpO2: 94% 94%    Last Pain:  Vitals:   03/02/20 1419  TempSrc:   PainSc: 0-No pain                 Andreu Drudge S

## 2020-03-06 ENCOUNTER — Other Ambulatory Visit: Payer: Self-pay

## 2020-03-06 ENCOUNTER — Encounter: Payer: Self-pay | Admitting: Internal Medicine

## 2020-03-06 ENCOUNTER — Ambulatory Visit: Payer: Medicare HMO | Admitting: Internal Medicine

## 2020-03-06 VITALS — BP 152/78 | HR 62 | Ht 72.0 in | Wt 210.2 lb

## 2020-03-06 DIAGNOSIS — I4819 Other persistent atrial fibrillation: Secondary | ICD-10-CM

## 2020-03-06 DIAGNOSIS — I484 Atypical atrial flutter: Secondary | ICD-10-CM

## 2020-03-06 NOTE — Progress Notes (Signed)
PCP: Marrian Salvage, FNP   Mario [name] is a 80 y.o. male who presents today for electrophysiology followup.  His R leg is with moderate hematoma.  + ecchymosis of the groin into the R scrotum.  Moderate thigh swelling,  Mild edema at the ankle.  No tenderness.  Denies dizziness, presyncope, syncope, CP , SOB or other concerns.  Past Medical History:  Diagnosis Date  . Aortic insufficiency   . Arthritis    neck  . History of kidney stones   . Hypertension   . Melanoma ([place]) 2000   back; stage 3, squamous 1 - basal- several  . OSA (obstructive sleep apnea)    uses CPAP  . Persistent atrial fibrillation (HCC)    afib - cardioverted 01/2015  . Phlebitis   . Pneumonia   . Seasonal allergies   . Tubular adenoma of colon 2013   colonoscpy in 5 years [name]   Past Surgical History:  Procedure Laterality Date  . 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 ATRIAL FIBRILLATION ABLATION N/A 03/02/2020   Procedure: ATRIAL FIBRILLATION ABLATION;  Surgeon: [name], MD;  Location: [place] [facility];  Service: Cardiovascular;  Laterality: N/A;  . 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 CARDIOVERSION N/A 12/01/2014   Procedure: CARDIOVERSION;  Surgeon: Troy Sine, MD;  Location: [facility] ENDOSCOPY;  Service: Cardiovascular;  Laterality: N/A;  . CARDIOVERSION N/A 02/22/2020   Procedure: CARDIOVERSION;  Surgeon: Buford Dresser, MD;  Location: [facility] ENDOSCOPY;  Service: Cardiovascular;  Laterality: N/A;  . CATARACT EXTRACTION  2002, 2004   bilateral  . CYSTOSCOPY    . ELBOW SURGERY Left 1984   tendon repair left arm  . INCISION AND DRAINAGE OF WOUND Left 08/28/2016   Procedure: IRRIGATION AND DEBRIDEMENT WOUND;  Surgeon: Iran Planas, MD;  Location: [place];  Service: Orthopedics;  Laterality: Left;  . LITHOTRIPSY    . melanoma removal  2000   back  . 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 TENDON REPAIR Right 2012   right  . TONSILLECTOMY    . UPPER GI ENDOSCOPY      ROS- all systems are reviewed and negatives except as per HPI above  Current Outpatient Medications  Medication Sig Dispense Refill  . amLODipine (NORVASC) 10 MG tablet Take 1 tablet (10 mg total) by mouth daily. 90 tablet 3  . carvedilol (COREG) 6.25 MG tablet Take 1 tablet (6.25 mg total) by mouth 2 (two) times daily. 180 tablet 1  . Cholecalciferol (DIALYVITE VITAMIN D 5000) 125 MCG (5000 UT) capsule Take 5,000 Units by mouth daily.    [name] ELIQUIS 5 MG TABS tablet Take 1 tablet by mouth twice daily 180 tablet 0  . flecainide (TAMBOCOR) 100 MG tablet Take 1 tablet (100 mg total) by mouth 2 (two) times daily. 180 tablet 3  . GLUCOSAMINE-CHONDROITIN PO Take 1 tablet by mouth daily.     . hydrALAZINE (APRESOLINE) 25 MG tablet Take 1 tablet (25 mg total) by mouth 2 (two) times daily. Please schedule annual appt for refills. [phone]. 180 tablet 3  .  losartan (COZAAR) 100 MG tablet Take 1 tablet (100 mg total) by mouth daily. 90 tablet 3  . Lutein 20 MG CAPS Take 20 mg by mouth daily.    . Multiple Vitamin (MULTIVITAMIN) tablet Take 1 tablet by mouth daily.    [name] OVER THE COUNTER MEDICATION Place 4-5 drops into both eyes as needed (dry eyes). Thera tears    . pantoprazole (PROTONIX) 40 MG tablet Take 1 tablet (40 mg total) by mouth daily. 45 tablet 0  . Saw Palmetto, Serenoa repens, (SAW PALMETTO PO) Take 1 tablet by mouth daily.    [name] testosterone cypionate (DEPOTESTOSTERONE CYPIONATE) 200 MG/ML injection Inject 100 mg into the muscle every Wednesday.    . triamcinolone (KENALOG) 0.1 % Apply 1  application topically 2 (two) times daily as needed (red bumps).     No current facility-administered medications for this visit.    Physical Exam: Vitals:   03/06/20 1356  BP: (!) 152/78  Pulse: 62  SpO2: 96%  Weight: 210 lb 3.2 oz (95.3 kg)  Height: 6' (1.829 m)    GEN- The patient is well appearing, alert and oriented x 3 today.   Head- normocephalic, atraumatic Eyes-  Sclera clear, conjunctiva pink Ears- hearing intact Oropharynx- clear Lungs-   normal work of breathing Heart- Regular rate and rhythm  GI- soft  Extremities-  + moderate R groin/ thigh hematoma, no bruits 2+DP/PT pulses  Wt Readings from Last 3 Encounters:  03/06/20 210 lb 3.2 oz (95.3 kg)  03/02/20 210 lb (95.3 kg)  02/22/20 210 lb (95.3 kg)    EKG tracing ordered today is personally reviewed and shows sinus with early repolarization  Assessment and Plan:  1. Thigh hematoma post ablation Stable Continue supportive care Continue anticoagulation  Return to see me in 2 weeks  [name] MD, [facility] 03/06/2020 2:12 PM

## 2020-03-06 NOTE — Patient Instructions (Signed)
Medication Instructions:  Continue current medications  *If you need a refill on your cardiac medications before your next appointment, please call your pharmacy*   Lab Work: none If you have labs (blood work) drawn today and your tests are completely normal, you will receive your results only by: [name] MyChart Message (if you have MyChart) OR . A paper copy in the mail If you have any lab test that is abnormal or we need to change your treatment, we will call you to review the results.   Testing/Procedures: none   Follow-Up: At [facility], you and your health needs are our priority.  As part of our continuing mission to provide you with exceptional heart care, we have created designated Provider Care Teams.  These Care Teams include your primary Cardiologist (physician) and Advanced Practice Providers (APPs -  Physician Assistants and Nurse Practitioners) who all work together to provide you with the care you need, when you need it.  We recommend signing up for the patient portal called "MyChart".  Sign up information is provided on this After Visit Summary.  MyChart is used to connect with patients for Virtual Visits (Telemedicine).  Patients are able to view lab/test results, encounter notes, upcoming appointments, etc.  Non-urgent messages can be sent to your provider as well.   To learn more about what you can do with MyChart, go to [email or web address].    Your next appointment:  03/27/20 at 1:45 with Dr. Rayann Heman.

## 2020-03-10 ENCOUNTER — Ambulatory Visit: Payer: Medicare HMO | Admitting: Family

## 2020-03-20 ENCOUNTER — Telehealth (INDEPENDENT_AMBULATORY_CARE_PROVIDER_SITE_OTHER): Payer: Medicare HMO | Admitting: Family

## 2020-03-20 DIAGNOSIS — R42 Dizziness and giddiness: Secondary | ICD-10-CM

## 2020-03-20 DIAGNOSIS — J019 Acute sinusitis, unspecified: Secondary | ICD-10-CM

## 2020-03-20 MED ORDER — AMOXICILLIN-POT CLAVULANATE 875-125 MG PO TABS: 1.0000 | ORAL_TABLET | Freq: Two times a day (BID) | ORAL | 0 refills | Status: AC

## 2020-03-20 NOTE — Progress Notes (Signed)
Mario [name] is a 80 y.o. male with the following history as recorded in EpicCare:  Patient Active Problem List   Diagnosis Date Noted  . Secondary hypercoagulable state ([place]) 02/15/2020  . Atypical chest pain   . Near syncope   . Swelling of both ankles 07/02/2015  . 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 OSA on CPAP 01/12/2015  . Anticoagulation adequate 11/03/2014  . Atrial fibrillation ([place]) 09/29/2014  . History of colonic polyps 09/20/2014  . 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 Stricture and stenosis of esophagus 06/20/2008  . 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 HYPERTENSION, BENIGN 05/06/2006  . ELEVATED PROSTATE SPECIFIC ANTIGEN 05/06/2006  . Melanoma ([place]) 04/10/2006  . CATARACT 04/10/2006  . Diverticulitis of colon 04/10/2006  . NEPHROLITHIASIS 04/10/2006    Current Outpatient Medications  Medication Sig Dispense Refill  . amoxicillin-clavulanate (AUGMENTIN) 875-125 MG tablet Take 1 tablet by mouth 2 (two) times daily for 10 days. 20 tablet 0  . amLODipine (NORVASC) 10 MG tablet Take 1 tablet (10 mg total) by mouth daily. 90 tablet 3  . carvedilol (COREG) 6.25 MG tablet Take 1 tablet (6.25 mg total) by mouth 2 (two) times daily. 180 tablet 1  . Cholecalciferol (DIALYVITE VITAMIN D 5000) 125 MCG (5000 UT) capsule Take 5,000 Units by mouth daily.    [name] ELIQUIS 5 MG TABS tablet Take 1 tablet by mouth twice daily 180 tablet 0  . flecainide (TAMBOCOR) 100 MG tablet Take 1 tablet (100 mg total) by mouth 2 (two) times daily. 180 tablet 3  . GLUCOSAMINE-CHONDROITIN PO Take 1 tablet by mouth daily.     . hydrALAZINE (APRESOLINE) 25 MG tablet Take 1 tablet (25 mg total) by mouth 2 (two) times daily. Please schedule annual appt for refills. [phone]. 180 tablet 3  . losartan (COZAAR) 100 MG tablet Take 1 tablet (100 mg total) by mouth daily. 90 tablet 3  . Lutein 20 MG CAPS Take 20 mg by mouth daily.    . Multiple Vitamin  (MULTIVITAMIN) tablet Take 1 tablet by mouth daily.    [name] OVER THE COUNTER MEDICATION Place 4-5 drops into both eyes as needed (dry eyes). Thera tears    . pantoprazole (PROTONIX) 40 MG tablet Take 1 tablet (40 mg total) by mouth daily. 45 tablet 0  . Saw Palmetto, Serenoa repens, (SAW PALMETTO PO) Take 1 tablet by mouth daily.    [name] testosterone cypionate (DEPOTESTOSTERONE CYPIONATE) 200 MG/ML injection Inject 100 mg into the muscle every Wednesday.    . triamcinolone (KENALOG) 0.1 % Apply 1 application topically 2 (two) times daily as needed (red bumps).     No current facility-administered medications for this visit.    Allergies: Contrast media [iodinated diagnostic agents] and Lisinopril  Past Medical History:  Diagnosis Date  . Aortic insufficiency   . Arthritis    neck  . History of kidney stones   . Hypertension   . Melanoma ([place]) 2000   back; stage 3, squamous 1 - basal- several  . OSA (obstructive sleep apnea)    uses CPAP  . Persistent atrial fibrillation (H[facility])    afib - cardioverted 01/2015  . Phlebitis   . Pneumonia   . Seasonal allergies   . Tubular adenoma of colon 2013   colonoscpy in 5 years [name]    Past Surgical History:  Procedure Laterality Date  . 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 ATRIAL FIBRILLATION ABLATION  N/A 03/02/2020   Procedure: ATRIAL FIBRILLATION ABLATION;  Surgeon: [name], MD;  Location: [place] [facility];  Service: Cardiovascular;  Laterality: N/A;  . 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 CARDIOVERSION N/A 12/01/2014   Procedure: CARDIOVERSION;  Surgeon: Troy Sine, MD;  Location: [facility] ENDOSCOPY;  Service: Cardiovascular;  Laterality: N/A;  . CARDIOVERSION N/A 02/22/2020   Procedure: CARDIOVERSION;  Surgeon: Buford Dresser, MD;  Location: [facility] ENDOSCOPY;  Service: Cardiovascular;  Laterality: N/A;  . CATARACT EXTRACTION  2002, 2004   bilateral  . CYSTOSCOPY    . ELBOW SURGERY Left 1984   tendon  repair left arm  . INCISION AND DRAINAGE OF WOUND Left 08/28/2016   Procedure: IRRIGATION AND DEBRIDEMENT WOUND;  Surgeon: Iran Planas, MD;  Location: [place];  Service: Orthopedics;  Laterality: Left;  . LITHOTRIPSY    . melanoma removal  2000   back  . METACARPAL OSTEOTOMY Left 08/28/2016   Procedure: 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.;  Surgeon: Iran Planas, MD;  Location: [place];  Service: Orthopedics;  Laterality: Left;  [name] MICROLARYNGOSCOPY WITH LASER N/A 01/25/2015   Procedure: MICROLARYNGOSCOPY ;  Surgeon: Melida Quitter, MD;  Location: [place];  Service: ENT;  Laterality: N/A;  Microlaryngoscopy with 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 with CO2 laser   . TENDON REPAIR Right 2012   right  . TONSILLECTOMY    . UPPER GI ENDOSCOPY      Family History  Problem Relation Age of Onset  . Heart disease Mother   . Stroke Father   . Other Father        brain tumor-benign  . Heart disease Sister   . Heart disease Sister   . Hypertension Sister   . Colon cancer Neg Hx   . Stomach cancer Neg Hx   . Esophageal cancer Neg Hx   . Pancreatic cancer Neg Hx   . Liver disease Neg Hx     Social History   Tobacco Use  . Smoking status: Former Smoker    Start date: 11/20/1976    Quit date: 1986    Years since quitting: 36.1  . Smokeless tobacco: Never Used  . Tobacco comment: quit smoking 1986 ish  Substance Use Topics  . Alcohol use: Yes    Alcohol/week: 21.0 standard drinks    Types: 21 Standard drinks or equivalent per week    Comment: 2-3 beers per night    Subjective:   I connected with [name] on 03/20/20 at 10:20 AM EST by a telephone call and verified that I am speaking with the correct person using two identifiers.   I discussed the limitations of evaluation and management by telemedicine and the availability of in person appointments. The patient expressed understanding and agreed to proceed. Provider in office/ patient is at home; provider and patient are only  2 people on telephone call.   Patient is concerned that he has a sinus infection; is feeling sinus pressure/ dizziness; symptoms have been present since January 20 when he was seen at ER with complications from cardiac ablation. Notes that he "feels dizzy headed" only when he first wakes up in the morning; feels like there is pressure behind his eyes; notes the symptoms feel very similar to the sinus infection he had last October.  Had problems with A. Fib in early January and required a cardioversion; he is scheduled to see his cardiologist for re-check next week; per patient, he has been able to feel his  A. Fib in the past and feels that he is in "normal" rhythm;  He took his vitals for today's visit: 133/62 and pulse at 75; Protonix was only new medication added recently;   Has not spoken to his cardiologist about symptoms;     Objective:  There were no vitals filed for this visit.  Lungs: Respirations unlabored;  Neurologic: Alert and oriented; speech intact;   Assessment:  1. Dizziness   2. Acute sinusitis, recurrence not specified, unspecified location     Plan:  Will start Augmentin 875 mg bid x 10 days; Can also use saline nasal spray;  He understands that I have to work virtual for the next 2 days due to staffing issues at our clinic. I would like him seen in person for re-check if no improvement within 48 hours of starting antibiotic. He will be scheduled to see Dr. Sharlet Salina on Wednesday for re-check. He is also encouraged to reach out to his cardiologist.  Time spent 20 minutes  No follow-ups on file.  No orders of the defined types were placed in this encounter.   Requested Prescriptions   Signed Prescriptions Disp Refills  . amoxicillin-clavulanate (AUGMENTIN) 875-125 MG tablet 20 tablet 0    Sig: Take 1 tablet by mouth 2 (two) times daily for 10 days.

## 2020-03-22 ENCOUNTER — Other Ambulatory Visit: Payer: Self-pay

## 2020-03-22 ENCOUNTER — Ambulatory Visit (INDEPENDENT_AMBULATORY_CARE_PROVIDER_SITE_OTHER): Payer: Medicare HMO | Admitting: Internal Medicine

## 2020-03-22 ENCOUNTER — Encounter: Payer: Self-pay | Admitting: Internal Medicine

## 2020-03-22 ENCOUNTER — Ambulatory Visit: Payer: Medicare HMO | Admitting: Family

## 2020-03-22 VITALS — BP 132/70 | HR 74 | Temp 98.4°F | Resp 18 | Ht 72.0 in | Wt 204.0 lb

## 2020-03-22 DIAGNOSIS — R42 Dizziness and giddiness: Secondary | ICD-10-CM | POA: Diagnosis not present

## 2020-03-22 LAB — COMPREHENSIVE METABOLIC PANEL
ALT: 16 U/L (ref 0–53)
AST: 19 U/L (ref 0–37)
Albumin: 4.2 g/dL (ref 3.5–5.2)
Alkaline Phosphatase: 77 U/L (ref 39–117)
BUN: 25 mg/dL — ABNORMAL HIGH (ref 6–23)
CO2: 31 mEq/L (ref 19–32)
Calcium: 10.1 mg/dL (ref 8.4–10.5)
Chloride: 101 mEq/L (ref 96–112)
Creatinine, Ser: 1.26 mg/dL (ref 0.40–1.50)
GFR: 54.27 mL/min — ABNORMAL LOW (ref >60.00)
Glucose, Bld: 79 mg/dL (ref 70–99)
Potassium: 4.6 mEq/L (ref 3.5–5.1)
Sodium: 135 mEq/L (ref 135–145)
Total Bilirubin: 0.6 mg/dL (ref 0.2–1.2)
Total Protein: 7.3 g/dL (ref 6.0–8.3)

## 2020-03-22 LAB — CBC
HCT: 43.4 % (ref 39.0–52.0)
Hemoglobin: 15 g/dL (ref 13.0–17.0)
MCHC: 34.5 g/dL (ref 30.0–36.0)
MCV: 93.1 fl (ref 78.0–100.0)
Platelets: 194 10*3/uL (ref 150.0–400.0)
RBC: 4.66 Mil/uL (ref 4.22–5.81)
RDW: 13.4 % (ref 11.5–15.5)
WBC: 6.3 10*3/uL (ref 4.0–10.5)

## 2020-03-22 NOTE — Progress Notes (Signed)
   Subjective:   Patient ID: Mario [name], male    DOB: [birth year], 80 y.o.   MRN: [id number]  HPI The patient is a 80 YO man coming in for dizziness. Mostly in the morning first thing with rolling over. If he tries to sit up he gets dizziness and has to lay back down. This clears within 5 minutes. Then he is mostly clear throughout the day. Recent A fib ablation January and still feels like heart is regular. Seen by PCP and started on augmentin 2 days ago. He feels like this is helping some but not sure. Denies fevers or chills. Is getting a lot of sinus drainage out since starting the medication.  Review of Systems  Constitutional: Negative.   HENT: Negative.   Eyes: Negative.   Respiratory: Negative for cough, chest tightness and shortness of breath.   Cardiovascular: Negative for chest pain, palpitations and leg swelling.  Gastrointestinal: Negative for abdominal distention, abdominal pain, constipation, diarrhea, nausea and vomiting.  Musculoskeletal: Negative.   Skin: Negative.   Neurological: Positive for dizziness.  Psychiatric/Behavioral: Negative.     Objective:  Physical Exam Constitutional:      Appearance: He is well-developed and well-nourished.  HENT:     Head: Normocephalic and atraumatic.     Ears:     Comments: Slight bulging TM bilateral clear fluid Eyes:     Extraocular Movements: EOM normal.  Cardiovascular:     Rate and Rhythm: Normal rate and regular rhythm.  Pulmonary:     Effort: Pulmonary effort is normal. No respiratory distress.     Breath sounds: Normal breath sounds. No wheezing or rales.  Abdominal:     General: Bowel sounds are normal. There is no distension.     Palpations: Abdomen is soft.     Tenderness: There is no abdominal tenderness. There is no rebound.  Musculoskeletal:        General: No edema.     Cervical back: Normal range of motion.  Skin:    General: Skin is warm and dry.  Neurological:     Mental Status: He is alert and  oriented to person, place, and time.     Coordination: Coordination normal.  Psychiatric:        Mood and Affect: Mood and affect normal.     Vitals:   03/22/20 0946  BP: 132/70  Pulse: 74  Resp: 18  Temp: 98.4 F (36.9 C)  TempSrc: Oral  SpO2: 96%  Weight: 204 lb (92.5 kg)  Height: 6' (1.829 m)    This visit occurred during the SARS-CoV-2 public health emergency.  Safety protocols were in place, including screening questions prior to the visit, additional usage of staff PPE, and extensive cleaning of exam room while observing appropriate contact time as indicated for disinfecting solutions.   Assessment & Plan:

## 2020-03-22 NOTE — Patient Instructions (Signed)
We will check the labs. Keep taking the antibiotic until it is gone which should help with the dizziness.

## 2020-03-22 NOTE — Assessment & Plan Note (Signed)
Checking CBC and CMP to rule out metabolic causes. Suspect related to sinus infection and advised to continue medication until gone.

## 2020-03-27 ENCOUNTER — Ambulatory Visit: Payer: Medicare HMO | Admitting: Internal Medicine

## 2020-03-27 ENCOUNTER — Other Ambulatory Visit: Payer: Self-pay

## 2020-03-27 ENCOUNTER — Encounter: Payer: Self-pay | Admitting: Internal Medicine

## 2020-03-27 VITALS — BP 148/80 | HR 76 | Ht 72.0 in | Wt 205.6 lb

## 2020-03-27 DIAGNOSIS — I4819 Other persistent atrial fibrillation: Secondary | ICD-10-CM | POA: Diagnosis not present

## 2020-03-27 DIAGNOSIS — I484 Atypical atrial flutter: Secondary | ICD-10-CM | POA: Diagnosis not present

## 2020-03-27 MED ORDER — FLECAINIDE ACETATE 50 MG PO TABS: 50.0000 mg | ORAL_TABLET | Freq: Two times a day (BID) | ORAL | 3 refills | Status: DC

## 2020-03-27 NOTE — Patient Instructions (Addendum)
Medication Instructions:  Reduce your flecainide to 50 mg two times a day  Your physician recommends that you continue on your current medications as directed. Please refer to the Current Medication list given to you today.  Labwork: None ordered.  Testing/Procedures: None ordered.  Follow-Up: Your physician wants you to follow-up in: 06/05/20 at 10:30 am with Dr. Rayann Heman.  Any Other Special Instructions Will Be Listed Below (If Applicable).  If you need a refill on your cardiac medications before your next appointment, please call your pharmacy.

## 2020-03-27 NOTE — Progress Notes (Signed)
PCP: Marrian Salvage, FNP   Mario [name] is a 80 y.o. male who presents today for routine electrophysiology followup.  Since last being seen in our clinic, the patient reports doing very well.  Today, he denies symptoms of palpitations, chest pain, shortness of breath,  lower extremity edema,   presyncope, or syncope.  He has had some dizziness, more frequently in the morning.  He has been seen by Dr Sharlet Salina and started on antibiotics for this. The patient is otherwise without complaint today.   Past Medical History:  Diagnosis Date  . Aortic insufficiency   . Arthritis    neck  . History of kidney stones   . Hypertension   . Melanoma ([place]) 2000   back; stage 3, squamous 1 - basal- several  . OSA (obstructive sleep apnea)    uses CPAP  . Persistent atrial fibrillation (HCC)    afib - cardioverted 01/2015  . Phlebitis   . Pneumonia   . Seasonal allergies   . Tubular adenoma of colon 2013   colonoscpy in 5 years [name]   Past Surgical History:  Procedure Laterality Date  . 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 ATRIAL FIBRILLATION ABLATION N/A 03/02/2020   Procedure: ATRIAL FIBRILLATION ABLATION;  Surgeon: [name], MD;  Location: [place] [facility];  Service: Cardiovascular;  Laterality: N/A;  . 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 CARDIOVERSION N/A 12/01/2014   Procedure: CARDIOVERSION;  Surgeon: Troy Sine, MD;  Location: [facility] ENDOSCOPY;  Service: Cardiovascular;  Laterality: N/A;  . CARDIOVERSION N/A 02/22/2020   Procedure: CARDIOVERSION;  Surgeon: Buford Dresser, MD;  Location: [facility] ENDOSCOPY;  Service: Cardiovascular;  Laterality: N/A;  . CATARACT EXTRACTION  2002, 2004   bilateral  . CYSTOSCOPY    . ELBOW SURGERY Left 1984   tendon repair left arm  . INCISION AND DRAINAGE OF WOUND Left 08/28/2016   Procedure: IRRIGATION AND DEBRIDEMENT WOUND;  Surgeon: Iran Planas, MD;  Location: [place];  Service: Orthopedics;  Laterality:  Left;  . LITHOTRIPSY    . melanoma removal  2000   back  . METACARPAL OSTEOTOMY Left 08/28/2016   Procedure: 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.;  Surgeon: Iran Planas, MD;  Location: [place];  Service: Orthopedics;  Laterality: Left;  [name] MICROLARYNGOSCOPY WITH LASER N/A 01/25/2015   Procedure: MICROLARYNGOSCOPY ;  Surgeon: Melida Quitter, MD;  Location: [place];  Service: ENT;  Laterality: N/A;  Microlaryngoscopy with 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 with CO2 laser   . TENDON REPAIR Right 2012   right  . TONSILLECTOMY    . UPPER GI ENDOSCOPY      ROS- all systems are reviewed and negatives except as per HPI above  Current Outpatient Medications  Medication Sig Dispense Refill  . amLODipine (NORVASC) 10 MG tablet Take 1 tablet (10 mg total) by mouth daily. 90 tablet 3  . amoxicillin-clavulanate (AUGMENTIN) 875-125 MG tablet Take 1 tablet by mouth 2 (two) times daily for 10 days. 20 tablet 0  . carvedilol (COREG) 6.25 MG tablet Take 1 tablet (6.25 mg total) by mouth 2 (two) times daily. 180 tablet 1  . Cholecalciferol (DIALYVITE VITAMIN D 5000) 125 MCG (5000 UT) capsule Take 5,000 Units by mouth daily.    [name] ELIQUIS 5 MG TABS tablet Take 1 tablet by mouth twice daily 180 tablet 0  . flecainide (TAMBOCOR) 100 MG tablet Take 1 tablet (100 mg total) by mouth 2 (  two) times daily. 180 tablet 3  . GLUCOSAMINE-CHONDROITIN PO Take 1 tablet by mouth daily.     . hydrALAZINE (APRESOLINE) 25 MG tablet Take 1 tablet (25 mg total) by mouth 2 (two) times daily. Please schedule annual appt for refills. [phone]. 180 tablet 3  . losartan (COZAAR) 100 MG tablet Take 1 tablet (100 mg total) by mouth daily. 90 tablet 3  . Lutein 20 MG CAPS Take 20 mg by mouth daily.    . Multiple Vitamin (MULTIVITAMIN) tablet Take 1 tablet by mouth daily.    [name] OVER THE COUNTER MEDICATION Place 4-5 drops into both eyes as needed (dry eyes). Thera tears    . pantoprazole (PROTONIX) 40 MG tablet Take 1 tablet (40  mg total) by mouth daily. 45 tablet 0  . Saw Palmetto, Serenoa repens, (SAW PALMETTO PO) Take 1 tablet by mouth daily.    [name] testosterone cypionate (DEPOTESTOSTERONE CYPIONATE) 200 MG/ML injection Inject 100 mg into the muscle every Wednesday.    . triamcinolone (KENALOG) 0.1 % Apply 1 application topically 2 (two) times daily as needed (red bumps).     No current facility-administered medications for this visit.    Physical Exam: Vitals:   03/27/20 1347  BP: (!) 148/80  Pulse: 76  SpO2: 96%  Weight: 205 lb 9.6 oz (93.3 kg)  Height: 6' (1.829 m)    GEN- The patient is well appearing, alert and oriented x 3 today.   Head- normocephalic, atraumatic Eyes-  Sclera clear, conjunctiva pink Ears- hearing intact Oropharynx- clear Lungs- Clear to ausculation bilaterally, normal work of breathing Heart- Regular rate and rhythm, no murmurs, rubs or gallops, PMI not laterally displaced GI- soft, NT, ND, + BS Extremities- no clubbing, cyanosis, or edema, R groin hematoma is resolved.  There is no bruit.  + mild ecchyomosis over pubic region  Wt Readings from Last 3 Encounters:  03/27/20 205 lb 9.6 oz (93.3 kg)  03/22/20 204 lb (92.5 kg)  03/06/20 210 lb 3.2 oz (95.3 kg)    Assessment and Plan:  1. R thigh hematoma Nearly resolved  2. Persistent afib/ atrial flutter Doing well post ablation chads2vasc scorei s 3.  Continue  eliquis Reduce flecainide to 50mg  BID  2. HTN Stable No change required today  3. AI Echo 02/17/20 reviewed Mild to moderate AI  4. Dizziness Cbc and bmet reviewed Adequate hydration advised Reduce flecainide to 50mg  BID  Risks, benefits and potential toxicities for medications prescribed and/or refilled reviewed with patient today.   Return as scheduled Does not need to see AF clinic later this week  [name] MD, [facility] 03/27/2020 2:02 PM

## 2020-03-28 ENCOUNTER — Telehealth: Payer: Self-pay | Admitting: Family

## 2020-03-28 NOTE — Telephone Encounter (Signed)
Patient wondering if he can get a copy of his recent labs mailed to him to his address on file, saw Dr. Sharlet Salina on 02.09.22

## 2020-03-28 NOTE — Telephone Encounter (Signed)
Labs have been placed in the mail for the patient.

## 2020-03-30 ENCOUNTER — Ambulatory Visit (HOSPITAL_COMMUNITY): Payer: Medicare HMO | Admitting: Physician Assistant

## 2020-04-21 DIAGNOSIS — G4733 Obstructive sleep apnea (adult) (pediatric): Secondary | ICD-10-CM | POA: Diagnosis not present

## 2020-04-27 ENCOUNTER — Other Ambulatory Visit: Payer: Self-pay | Admitting: Adult Health

## 2020-04-27 ENCOUNTER — Telehealth: Payer: Self-pay | Admitting: Adult Health

## 2020-04-27 DIAGNOSIS — I10 Essential (primary) hypertension: Secondary | ICD-10-CM

## 2020-04-28 NOTE — Telephone Encounter (Signed)
I would recommend pt get rx transferred to another pharmacy as its only a [facility] supply issue

## 2020-04-28 NOTE — Telephone Encounter (Signed)
Left message for patient that we need to transfer his prescription to another pharmacy. He is to call back Monday morning.

## 2020-04-28 NOTE — Telephone Encounter (Signed)
Pt c/o medication issue:  1. Name of Medication:  [place]   2. How are you currently taking this medication (dosage and times per day)? 1 tab daily 100   3. Are you having a reaction (difficulty breathing--STAT)? Na    4. What is your medication issue? Pt called in and stated pharmacy is needing a alternate,  [facility] [place] [facility]    Best number for pt [phone]  Pt only has 5 pills left

## 2020-04-28 NOTE — Telephone Encounter (Signed)
Will forward to the pharm md to advise

## 2020-05-01 ENCOUNTER — Other Ambulatory Visit: Payer: Self-pay

## 2020-05-01 MED ORDER — IRBESARTAN 300 MG PO TABS: 300.0000 mg | ORAL_TABLET | Freq: Every day | ORAL | 1 refills | Status: DC

## 2020-05-01 NOTE — Telephone Encounter (Signed)
[facility] reports [facility] do not have losartan.  Will switch to irbesartan.  Patient aware.

## 2020-05-01 NOTE — Addendum Note (Signed)
Addended by: Rollen Sox on: 05/01/2020 01:40 PM   Modules accepted: Orders

## 2020-05-09 ENCOUNTER — Ambulatory Visit (INDEPENDENT_AMBULATORY_CARE_PROVIDER_SITE_OTHER): Payer: Medicare HMO | Admitting: Family

## 2020-05-09 ENCOUNTER — Encounter: Payer: Self-pay | Admitting: Family

## 2020-05-09 ENCOUNTER — Other Ambulatory Visit: Payer: Self-pay

## 2020-05-09 VITALS — BP 124/60 | HR 62 | Temp 97.6°F | Ht 72.0 in | Wt 202.4 lb

## 2020-05-09 DIAGNOSIS — I10 Essential (primary) hypertension: Secondary | ICD-10-CM

## 2020-05-09 DIAGNOSIS — Z1159 Encounter for screening for other viral diseases: Secondary | ICD-10-CM | POA: Diagnosis not present

## 2020-05-09 DIAGNOSIS — Z0000 Encounter for general adult medical examination without abnormal findings: Secondary | ICD-10-CM

## 2020-05-09 DIAGNOSIS — H6980 Other specified disorders of Eustachian tube, unspecified ear: Secondary | ICD-10-CM | POA: Diagnosis not present

## 2020-05-09 DIAGNOSIS — Z13220 Encounter for screening for lipoid disorders: Secondary | ICD-10-CM | POA: Diagnosis not present

## 2020-05-09 DIAGNOSIS — Z125 Encounter for screening for malignant neoplasm of prostate: Secondary | ICD-10-CM | POA: Diagnosis not present

## 2020-05-09 LAB — COMPREHENSIVE METABOLIC PANEL
ALT: 14 U/L (ref 0–53)
AST: 20 U/L (ref 0–37)
Albumin: 4.2 g/dL (ref 3.5–5.2)
Alkaline Phosphatase: 68 U/L (ref 39–117)
BUN: 29 mg/dL — ABNORMAL HIGH (ref 6–23)
CO2: 30 mEq/L (ref 19–32)
Calcium: 9.9 mg/dL (ref 8.4–10.5)
Chloride: 101 mEq/L (ref 96–112)
Creatinine, Ser: 1.33 mg/dL (ref 0.40–1.50)
GFR: 50.81 mL/min — ABNORMAL LOW (ref >60.00)
Glucose, Bld: 91 mg/dL (ref 70–99)
Potassium: 5.1 mEq/L (ref 3.5–5.1)
Sodium: 137 mEq/L (ref 135–145)
Total Bilirubin: 0.6 mg/dL (ref 0.2–1.2)
Total Protein: 6.9 g/dL (ref 6.0–8.3)

## 2020-05-09 LAB — CBC WITH DIFFERENTIAL/PLATELET
Basophils Absolute: 0 10*3/uL (ref 0.0–0.1)
Basophils Relative: 0.3 % (ref 0.0–3.0)
Eosinophils Absolute: 0.2 10*3/uL (ref 0.0–0.7)
Eosinophils Relative: 2.7 % (ref 0.0–5.0)
HCT: 41.9 % (ref 39.0–52.0)
Hemoglobin: 14.6 g/dL (ref 13.0–17.0)
Lymphocytes Relative: 20.2 % (ref 12.0–46.0)
Lymphs Abs: 1.3 10*3/uL (ref 0.7–4.0)
MCHC: 34.8 g/dL (ref 30.0–36.0)
MCV: 92.1 fl (ref 78.0–100.0)
Monocytes Absolute: 0.9 10*3/uL (ref 0.1–1.0)
Monocytes Relative: 13.8 % — ABNORMAL HIGH (ref 3.0–12.0)
Neutro Abs: 4.1 10*3/uL (ref 1.4–7.7)
Neutrophils Relative %: 63 % (ref 43.0–77.0)
Platelets: 190 10*3/uL (ref 150.0–400.0)
RBC: 4.55 Mil/uL (ref 4.22–5.81)
RDW: 12.9 % (ref 11.5–15.5)
WBC: 6.6 10*3/uL (ref 4.0–10.5)

## 2020-05-09 LAB — LIPID PANEL
Cholesterol: 166 mg/dL (ref 0–200)
HDL: 42.6 mg/dL (ref >39.00)
LDL Cholesterol: 92 mg/dL (ref 0–99)
NonHDL: 123.17
Total CHOL/HDL Ratio: 4
Triglycerides: 158 mg/dL — ABNORMAL HIGH (ref 0.0–149.0)
VLDL: 31.6 mg/dL (ref 0.0–40.0)

## 2020-05-09 LAB — PSA: PSA: 11.54 ng/mL — ABNORMAL HIGH (ref 0.10–4.00)

## 2020-05-09 MED ORDER — IRBESARTAN 150 MG PO TABS: 150.0000 mg | ORAL_TABLET | Freq: Every day | ORAL | 0 refills | Status: DC

## 2020-05-09 NOTE — Progress Notes (Signed)
Mario [name] is a 80 y.o. male with the following history as recorded in EpicCare:  Patient Active Problem List   Diagnosis Date Noted  . Dizziness 03/22/2020  . Secondary hypercoagulable state ([place]) 02/15/2020  . Atypical chest pain   . Near syncope   . Swelling of both ankles 07/02/2015  . 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 OSA on CPAP 01/12/2015  . Anticoagulation adequate 11/03/2014  . Atrial fibrillation ([place]) 09/29/2014  . History of colonic polyps 09/20/2014  . 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 Stricture and stenosis of esophagus 06/20/2008  . 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 HYPERTENSION, BENIGN 05/06/2006  . ELEVATED PROSTATE SPECIFIC ANTIGEN 05/06/2006  . Melanoma ([place]) 04/10/2006  . CATARACT 04/10/2006  . Diverticulitis of colon 04/10/2006  . NEPHROLITHIASIS 04/10/2006    Current Outpatient Medications  Medication Sig Dispense Refill  . amLODipine (NORVASC) 10 MG tablet Take 1 tablet (10 mg total) by mouth daily. 90 tablet 3  . carvedilol (COREG) 6.25 MG tablet Take 1 tablet (6.25 mg total) by mouth 2 (two) times daily. 180 tablet 1  . Cholecalciferol (DIALYVITE VITAMIN D 5000) 125 MCG (5000 UT) capsule Take 5,000 Units by mouth daily.    [name] ELIQUIS 5 MG TABS tablet Take 1 tablet by mouth twice daily 180 tablet 0  . flecainide (TAMBOCOR) 50 MG tablet Take 1 tablet (50 mg total) by mouth 2 (two) times daily. 180 tablet 3  . GLUCOSAMINE-CHONDROITIN PO Take 1 tablet by mouth daily.     . hydrALAZINE (APRESOLINE) 25 MG tablet Take 1 tablet (25 mg total) by mouth 2 (two) times daily. Please schedule annual appt for refills. [phone]. 180 tablet 3  . Lutein 20 MG CAPS Take 20 mg by mouth daily.    . Multiple Vitamin (MULTIVITAMIN) tablet Take 1 tablet by mouth daily.    [name] OVER THE COUNTER MEDICATION Place 4-5 drops into both eyes as needed (dry eyes). Thera tears    . Saw Palmetto, Serenoa repens, (SAW PALMETTO PO)  Take 1 tablet by mouth daily.    [name] testosterone cypionate (DEPOTESTOSTERONE CYPIONATE) 200 MG/ML injection Inject 100 mg into the muscle every Wednesday.    . triamcinolone (KENALOG) 0.1 % Apply 1 application topically 2 (two) times daily as needed (red bumps).    . irbesartan (AVAPRO) 150 MG tablet Take 1 tablet (150 mg total) by mouth daily. 90 tablet 0   No current facility-administered medications for this visit.    Allergies: Contrast media [iodinated diagnostic agents] and Lisinopril  Past Medical History:  Diagnosis Date  . Aortic insufficiency   . Arthritis    neck  . History of kidney stones   . Hypertension   . Melanoma ([place]) 2000   back; stage 3, squamous 1 - basal- several  . OSA (obstructive sleep apnea)    uses CPAP  . Persistent atrial fibrillation (HCC)    afib - cardioverted 01/2015  . Phlebitis   . Pneumonia   . Seasonal allergies   . Tubular adenoma of colon 2013   colonoscpy in 5 years [name]    Past Surgical History:  Procedure Laterality Date  . 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 ATRIAL FIBRILLATION ABLATION N/A 03/02/2020   Procedure: ATRIAL FIBRILLATION ABLATION;  Surgeon: [name], MD;  Location: [facility];  Service: Cardiovascular;  Laterality: N/A;  . 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 CARDIOVERSION N/A  12/01/2014   Procedure: CARDIOVERSION;  Surgeon: Troy Sine, MD;  Location: [facility] ENDOSCOPY;  Service: Cardiovascular;  Laterality: N/A;  . CARDIOVERSION N/A 02/22/2020   Procedure: CARDIOVERSION;  Surgeon: Buford Dresser, MD;  Location: [facility] ENDOSCOPY;  Service: Cardiovascular;  Laterality: N/A;  . CATARACT EXTRACTION  2002, 2004   bilateral  . CYSTOSCOPY    . ELBOW SURGERY Left 1984   tendon repair left arm  . INCISION AND DRAINAGE OF WOUND Left 08/28/2016   Procedure: IRRIGATION AND DEBRIDEMENT WOUND;  Surgeon: Iran Planas, MD;  Location: [place];  Service: Orthopedics;  Laterality: Left;  .  LITHOTRIPSY    . melanoma removal  2000   back  . METACARPAL OSTEOTOMY Left 08/28/2016   Procedure: 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.;  Surgeon: Iran Planas, MD;  Location: [place];  Service: Orthopedics;  Laterality: Left;  [name] MICROLARYNGOSCOPY WITH LASER N/A 01/25/2015   Procedure: MICROLARYNGOSCOPY ;  Surgeon: Melida Quitter, MD;  Location: [place];  Service: ENT;  Laterality: N/A;  Microlaryngoscopy with 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 with CO2 laser   . TENDON REPAIR Right 2012   right  . TONSILLECTOMY    . UPPER GI ENDOSCOPY      Family History  Problem Relation Age of Onset  . Heart disease Mother   . Stroke Father   . Other Father        brain tumor-benign  . Heart disease Sister   . Heart disease Sister   . Hypertension Sister   . Colon cancer Neg Hx   . Stomach cancer Neg Hx   . Esophageal cancer Neg Hx   . Pancreatic cancer Neg Hx   . Liver disease Neg Hx     Social History   Tobacco Use  . Smoking status: Former Smoker    Start date: 11/20/1976    Quit date: 1986    Years since quitting: 36.2  . Smokeless tobacco: Never Used  . Tobacco comment: quit smoking 1986 ish  Substance Use Topics  . Alcohol use: Yes    Alcohol/week: 21.0 standard drinks    Types: 21 Standard drinks or equivalent per week    Comment: 2-3 beers per night    Subjective:  Presents for yearly CPE; is worried that his blood pressure may be too low since his blood pressure medication was changed from Losartan to Avapro; has been feeling more tired and sluggish;  Does see eye doctor and dentist regularly;  Review of Systems  Constitutional: Negative.   HENT: Negative.   Eyes: Negative.   Respiratory: Negative.   Cardiovascular: Negative.   Gastrointestinal: Negative.   Genitourinary: Negative.   Musculoskeletal: Negative.   Skin: Negative.   Neurological: Negative.   Endo/Heme/Allergies: Negative.   Psychiatric/Behavioral: Negative.      Objective:  Vitals:    05/09/20 1052  BP: 124/60  Pulse: 62  Temp: 97.6 F (36.4 C)  TempSrc: Oral  SpO2: 96%  Weight: 202 lb 6.4 oz (91.8 kg)  Height: 6' (1.829 m)    General: Well developed, well nourished, in no acute distress  Skin : Warm and dry.  Head: Normocephalic and atraumatic  Eyes: Sclera and conjunctiva clear; pupils round and reactive to light; extraocular movements intact  Ears: External normal; canals clear; tympanic membranes normal  Oropharynx: Pink, supple. No suspicious lesions  Neck: Supple without thyromegaly, adenopathy  Lungs: Respirations unlabored; clear to auscultation bilaterally without wheeze, rales, rhonchi  CVS exam: normal rate and regular rhythm.  Abdomen: Soft; nontender; nondistended; normoactive bowel sounds; no masses or hepatosplenomegaly  Musculoskeletal: No deformities; no active joint inflammation  Extremities: No edema, cyanosis, clubbing  Vessels: Symmetric bilaterally  Neurologic: Alert and oriented; speech intact; face symmetrical; moves all extremities well; CNII-XII intact without focal deficit   Assessment:  1. PE (physical exam), annual   2. HYPERTENSION, BENIGN   3. Lipid screening   4. Prostate cancer screening   5. Need for hepatitis C screening test   6. Dysfunction of Eustachian tube, unspecified laterality     Plan:  Age appropriate preventive healthcare needs addressed; encouraged regular eye doctor and dental exams; encouraged regular exercise; will update labs and refills as needed today; follow-up to be determined; Try cutting Avapro back to 150 mg- he will let our office or his cardiologist know response; follow-up to be determined; Refer to ENT as discussed;  This visit occurred during the SARS-CoV-2 public health emergency.  Safety protocols were in place, including screening questions prior to the visit, additional usage of staff PPE, and extensive cleaning of exam room while observing appropriate contact time as indicated for  disinfecting solutions.     No follow-ups on file.  Orders Placed This Encounter  Procedures  . CBC with Differential/Platelet    Standing Status:   Future    Number of Occurrences:   1    Standing Expiration Date:   05/09/2021  . Comp Met (CMET)    Standing Status:   Future    Number of Occurrences:   1    Standing Expiration Date:   05/09/2021  . Lipid panel    Standing Status:   Future    Number of Occurrences:   1    Standing Expiration Date:   05/09/2021  . PSA    Standing Status:   Future    Number of Occurrences:   1    Standing Expiration Date:   05/09/2021  . Hepatitis C Antibody    Standing Status:   Future    Number of Occurrences:   1    Standing Expiration Date:   05/09/2021  . Ambulatory referral to ENT    Referral Priority:   Routine    Referral Type:   Consultation    Referral Reason:   Specialty Services Required    Requested Specialty:   Otolaryngology    Number of Visits Requested:   1    Requested Prescriptions   Signed Prescriptions Disp Refills  . irbesartan (AVAPRO) 150 MG tablet 90 tablet 0    Sig: Take 1 tablet (150 mg total) by mouth daily.

## 2020-05-09 NOTE — Patient Instructions (Signed)
Let's try cutting the Avapro ( Irbesartan) back to 150 mg; continue to check your blood pressure regulalry;

## 2020-05-10 LAB — HEPATITIS C ANTIBODY
Hepatitis C Ab: NONREACTIVE
SIGNAL TO CUT-OFF: 0.03 (ref <1.00)

## 2020-05-16 ENCOUNTER — Other Ambulatory Visit: Payer: Self-pay | Admitting: Adult Health

## 2020-05-21 ENCOUNTER — Other Ambulatory Visit: Payer: Self-pay | Admitting: Adult Health

## 2020-05-23 DIAGNOSIS — E349 Endocrine disorder, unspecified: Secondary | ICD-10-CM | POA: Diagnosis not present

## 2020-06-05 ENCOUNTER — Ambulatory Visit: Payer: Medicare HMO | Admitting: Internal Medicine

## 2020-06-05 ENCOUNTER — Encounter: Payer: Self-pay | Admitting: Internal Medicine

## 2020-06-05 ENCOUNTER — Other Ambulatory Visit: Payer: Self-pay

## 2020-06-05 VITALS — BP 122/62 | HR 67 | Ht 72.0 in | Wt 206.6 lb

## 2020-06-05 DIAGNOSIS — I484 Atypical atrial flutter: Secondary | ICD-10-CM | POA: Diagnosis not present

## 2020-06-05 DIAGNOSIS — I10 Essential (primary) hypertension: Secondary | ICD-10-CM

## 2020-06-05 DIAGNOSIS — R42 Dizziness and giddiness: Secondary | ICD-10-CM

## 2020-06-05 DIAGNOSIS — I4819 Other persistent atrial fibrillation: Secondary | ICD-10-CM

## 2020-06-05 MED ORDER — CARVEDILOL 6.25 MG PO TABS: 6.2500 mg | ORAL_TABLET | Freq: Two times a day (BID) | ORAL | 0 refills | Status: DC

## 2020-06-05 NOTE — Progress Notes (Signed)
PCP: Marrian Salvage, [place] Primary Cardiologist: Dr Brantley Fling is a 80 y.o. male who presents today for routine electrophysiology followup.  Since his recent afib ablation, the patient reports doing very well.  he denies procedure related complications and is pleased with the results of the procedure.  He did have a small thigh hematoma initially which resolved uneventfully.   He has fluid in his ears with associated dizziness.  He has plans to see ENT this week.  He also has a feeling of "heaviness" in his legs with weakness and fatigue.   He attributes this to his BP being "too low".    Today, he denies symptoms of palpitations, chest pain, shortness of breath,  lower extremity edema,  presyncope, or syncope.  The patient is otherwise without complaint today.   Past Medical History:  Diagnosis Date  . Aortic insufficiency   . Arthritis    neck  . History of kidney stones   . Hypertension   . Melanoma ([place]) 2000   back; stage 3, squamous 1 - basal- several  . OSA (obstructive sleep apnea)    uses CPAP  . Persistent atrial fibrillation (HCC)    afib - cardioverted 01/2015  . Phlebitis   . Pneumonia   . Seasonal allergies   . Tubular adenoma of colon 2013   colonoscpy in 5 years [name]   Past Surgical History:  Procedure Laterality Date  . 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 ATRIAL FIBRILLATION ABLATION N/A 03/02/2020   Procedure: ATRIAL FIBRILLATION ABLATION;  Surgeon: [name], MD;  Location: [facility];  Service: Cardiovascular;  Laterality: N/A;  . 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 CARDIOVERSION N/A 12/01/2014   Procedure: CARDIOVERSION;  Surgeon: Troy Sine, MD;  Location: [facility] ENDOSCOPY;  Service: Cardiovascular;  Laterality: N/A;  . CARDIOVERSION N/A 02/22/2020   Procedure: CARDIOVERSION;  Surgeon: Buford Dresser, MD;  Location: [facility] ENDOSCOPY;  Service: Cardiovascular;  Laterality: N/A;  . CATARACT  EXTRACTION  2002, 2004   bilateral  . CYSTOSCOPY    . ELBOW SURGERY Left 1984   tendon repair left arm  . INCISION AND DRAINAGE OF WOUND Left 08/28/2016   Procedure: IRRIGATION AND DEBRIDEMENT WOUND;  Surgeon: Iran Planas, MD;  Location: [place];  Service: Orthopedics;  Laterality: Left;  . LITHOTRIPSY    . melanoma removal  2000   back  . METACARPAL OSTEOTOMY Left 08/28/2016   Procedure: 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.;  Surgeon: Iran Planas, MD;  Location: [place];  Service: Orthopedics;  Laterality: Left;  [name] MICROLARYNGOSCOPY WITH LASER N/A 01/25/2015   Procedure: MICROLARYNGOSCOPY ;  Surgeon: Melida Quitter, MD;  Location: [place];  Service: ENT;  Laterality: N/A;  Microlaryngoscopy with 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 with CO2 laser   . TENDON REPAIR Right 2012   right  . TONSILLECTOMY    . UPPER GI ENDOSCOPY      ROS- all systems are personally reviewed and negatives except as per HPI above  Current Outpatient Medications  Medication Sig Dispense Refill  . amLODipine (NORVASC) 10 MG tablet Take 1 tablet by mouth once daily 90 tablet 2  . carvedilol (COREG) 6.25 MG tablet Take 1 tablet (6.25 mg total) by mouth 2 (two) times daily. 180 tablet 1  . Cholecalciferol (DIALYVITE VITAMIN D 5000) 125 MCG (5000 UT) capsule Take 5,000 Units by mouth daily.    [name] ELIQUIS 5 MG  TABS tablet Take 1 tablet by mouth twice daily 180 tablet 0  . flecainide (TAMBOCOR) 50 MG tablet Take 1 tablet (50 mg total) by mouth 2 (two) times daily. 180 tablet 3  . GLUCOSAMINE-CHONDROITIN PO Take 1 tablet by mouth daily.     . hydrALAZINE (APRESOLINE) 25 MG tablet Take 1 tablet by mouth twice daily 180 tablet 2  . irbesartan (AVAPRO) 150 MG tablet Take 1 tablet (150 mg total) by mouth daily. 90 tablet 0  . Lutein 20 MG CAPS Take 20 mg by mouth daily.    . Multiple Vitamin (MULTIVITAMIN) tablet Take 1 tablet by mouth daily.    [name] OVER THE COUNTER MEDICATION Place 4-5 drops into both eyes as needed  (dry eyes). Thera tears    . Saw Palmetto, Serenoa repens, (SAW PALMETTO PO) Take 1 tablet by mouth daily.    [name] testosterone cypionate (DEPOTESTOSTERONE CYPIONATE) 200 MG/ML injection Inject 100 mg into the muscle every Wednesday.    . triamcinolone (KENALOG) 0.1 % Apply 1 application topically 2 (two) times daily as needed (red bumps).     No current facility-administered medications for this visit.    Physical Exam: Vitals:   06/05/20 1042  BP: 122/62  Pulse: 67  SpO2: 96%  Weight: 206 lb 9.6 oz (93.7 kg)  Height: 6' (1.829 m)    GEN- The patient is well appearing, alert and oriented x 3 today.   Head- normocephalic, atraumatic Eyes-  Sclera clear, conjunctiva pink Ears- hearing intact Oropharynx- clear Lungs-   normal work of breathing Heart- Regular rate and rhythm  GI- soft  Extremities- no clubbing, cyanosis, or edema  EKG tracing ordered today is personally reviewed and shows sinus with first degree AV block  Assessment and Plan:  1. Persistent atrial fibrillation/ atrial flutter Doing well s/p ablation chads2vasc score is 3.  He is on eliquis. stop flecainide 4 weeks after stopping flecainide, stop coreg  2. HTN Stable No change required today  3. AI Mild to moderate by echo He will need to follow with Dr Claiborne Billings closely regarding this  4. Dizziness Felt to be due to fluid in his ears.  He has ENT follow-up  5. Leg eaviness/ weakness Wean off of flecainide and coreg (as above) Calcium score was very low.  My suspicion for CAD is low.  Consider further testing if does not resolve with medicine changes   Return to see me in 4 months  [name] MD, [facility] 06/05/2020 10:45 AM

## 2020-06-05 NOTE — Patient Instructions (Addendum)
Medication Instructions:  Stop Flecainide, then 4 weeks after stop your Coreg Your physician recommends that you continue on your current medications as directed. Please refer to the Current Medication list given to you today.  Labwork: None ordered.  Testing/Procedures: None ordered.  Follow-Up: Your physician wants you to follow-up in: 10/09/20 at 10:30 am with Dr. Rayann Heman.   Any Other Special Instructions Will Be Listed Below (If Applicable).  If you need a refill on your cardiac medications before your next appointment, please call your pharmacy.

## 2020-06-06 ENCOUNTER — Ambulatory Visit (INDEPENDENT_AMBULATORY_CARE_PROVIDER_SITE_OTHER): Payer: Medicare HMO | Admitting: Otolaryngology

## 2020-06-06 VITALS — Temp 97.5°F

## 2020-06-06 DIAGNOSIS — H6123 Impacted cerumen, bilateral: Secondary | ICD-10-CM

## 2020-06-06 DIAGNOSIS — J339 Nasal polyp, unspecified: Secondary | ICD-10-CM

## 2020-06-06 DIAGNOSIS — R42 Dizziness and giddiness: Secondary | ICD-10-CM | POA: Diagnosis not present

## 2020-06-06 NOTE — Progress Notes (Signed)
HPI: Mario [name] is a 80 y.o. male who presents is referred by his PCP for evaluation of dizziness as well as history of fluid behind his ears according to the patient.  Patient stated that this started following a cardiac ablation performed in January.  When he would wake up in the morning he would frequently be dizzy but did not really describe true vertigo or spinning.  This occurred mostly when he first got out of bed in the morning.  He did not describe any vertigo or dizziness while he was in bed.  He was diagnosed with fluid behind his TMs and treated with antibiotics.  He has not noted any hearing problems.  His dizziness has gradually gotten little bit better.  He is back to lifting weights and working out again. He is always had trouble breathing through his nose.  [name]  Past Medical History:  Diagnosis Date  . Aortic insufficiency   . Arthritis    neck  . History of kidney stones   . Hypertension   . Melanoma ([place]) 2000   back; stage 3, squamous 1 - basal- several  . OSA (obstructive sleep apnea)    uses CPAP  . Persistent atrial fibrillation (HCC)    afib - cardioverted 01/2015  . Phlebitis   . Pneumonia   . Seasonal allergies   . Tubular adenoma of colon 2013   colonoscpy in 5 years [name]   Past Surgical History:  Procedure Laterality Date  . 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 ATRIAL FIBRILLATION ABLATION N/A 03/02/2020   Procedure: ATRIAL FIBRILLATION ABLATION;  Surgeon: [name], MD;  Location: [place] [facility];  Service: Cardiovascular;  Laterality: N/A;  . 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 CARDIOVERSION N/A 12/01/2014   Procedure: CARDIOVERSION;  Surgeon: Troy Sine, MD;  Location: [facility] ENDOSCOPY;  Service: Cardiovascular;  Laterality: N/A;  . CARDIOVERSION N/A 02/22/2020   Procedure: CARDIOVERSION;  Surgeon: Buford Dresser, MD;  Location: [facility] ENDOSCOPY;  Service: Cardiovascular;  Laterality: N/A;  . CATARACT EXTRACTION   2002, 2004   bilateral  . CYSTOSCOPY    . ELBOW SURGERY Left 1984   tendon repair left arm  . INCISION AND DRAINAGE OF WOUND Left 08/28/2016   Procedure: IRRIGATION AND DEBRIDEMENT WOUND;  Surgeon: Iran Planas, MD;  Location: [place];  Service: Orthopedics;  Laterality: Left;  . LITHOTRIPSY    . melanoma removal  2000   back  . METACARPAL OSTEOTOMY Left 08/28/2016   Procedure: 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.;  Surgeon: Iran Planas, MD;  Location: [place];  Service: Orthopedics;  Laterality: Left;  [name] MICROLARYNGOSCOPY WITH LASER N/A 01/25/2015   Procedure: MICROLARYNGOSCOPY ;  Surgeon: Melida Quitter, MD;  Location: [place];  Service: ENT;  Laterality: N/A;  Microlaryngoscopy with 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 with CO2 laser   . TENDON REPAIR Right 2012   right  . TONSILLECTOMY    . UPPER GI ENDOSCOPY     Social History   Socioeconomic History  . Marital status: Married    Spouse name: Not on file  . Number of children: 2  . Years of education: Not on file  . Highest education level: Not on file  Occupational History  . Occupation: retired  Tobacco Use  . Smoking status: Former Smoker    Start date: 11/20/1976    Quit date: 1986    Years since quitting: 36.3  . Smokeless tobacco:  Never Used  . Tobacco comment: quit smoking 1986 ish  Vaping Use  . Vaping Use: Never used  Substance and Sexual Activity  . Alcohol use: Yes    Alcohol/week: 21.0 standard drinks    Types: 21 Standard drinks or equivalent per week    Comment: 2-3 beers per night  . Drug use: No  . Sexual activity: Not on file  Other Topics Concern  . Not on file  Social History Narrative   Lives in [place].  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 Heart disease Mother   . Stroke Father   . Other Father        brain tumor-benign  . Heart disease Sister   . Heart disease Sister   . Hypertension Sister   . Colon cancer Neg Hx   . Stomach cancer Neg Hx   . Esophageal cancer Neg Hx   . Pancreatic cancer Neg Hx   . Liver disease Neg Hx    Allergies  Allergen Reactions  . Contrast Media [Iodinated Diagnostic Agents] Shortness Of Breath, Rash and Other (See Comments)    First noted after Coronary CTA 02/24/20. Pt experienced wheezing, shortness of breath and diffuse rash/welts over face and trunk.  . Lisinopril Other (See Comments)    REACTION: severe GI upset requiring EGD--temporally related to lisinopril--tolerated micardis   Prior to Admission medications   Medication Sig Start Date End Date Taking? Authorizing Provider  amLODipine (NORVASC) 10 MG tablet Take 1 tablet by mouth once daily 05/22/20   Troy Sine, MD  carvedilol (COREG) 6.25 MG tablet Take 1 tablet (6.25 mg total) by mouth 2 (two) times daily. 06/05/20 07/10/20  Allred, Jeneen Rinks, MD  Cholecalciferol (DIALYVITE VITAMIN D 5000) 125 MCG (5000 UT) capsule Take 5,000 Units by mouth daily.    [provider]  ELIQUIS 5 MG TABS tablet Take 1 tablet by mouth twice daily 05/16/20   Allred, Jeneen Rinks, MD  GLUCOSAMINE-CHONDROITIN PO Take 1 tablet by mouth daily.     [provider]  hydrALAZINE (APRESOLINE) 25 MG tablet Take 1 tablet by mouth twice daily 05/22/20   Troy Sine, MD  irbesartan (AVAPRO) 150 MG tablet Take 1 tablet (150 mg total) by mouth daily. 05/09/20   Marrian Salvage, FNP  Lutein 20 MG CAPS Take 20 mg by mouth daily.    [provider]  Multiple Vitamin (MULTIVITAMIN) tablet Take 1 tablet by mouth daily.    [provider]  OVER THE COUNTER MEDICATION Place 4-5 drops into both eyes as needed (dry eyes). Thera tears    [provider]  Saw Palmetto,  Serenoa repens, (SAW PALMETTO PO) Take 1 tablet by mouth daily.    [provider]  testosterone cypionate (DEPOTESTOSTERONE CYPIONATE) 200 MG/ML injection Inject 100 mg into the muscle every Wednesday. 08/09/14   [provider]  triamcinolone (KENALOG) 0.1 % Apply 1 application topically 2 (two) times daily as needed (red bumps). 01/21/20   [provider]     Positive ROS: Otherwise negative  All other systems have been reviewed and were otherwise negative with the exception of those mentioned in  the HPI and as above.  Physical Exam: Constitutional: Alert, well-appearing, no acute distress Ears: External ears without lesions or tenderness.  He had wax buildup on both sides that was cleaned with curette suction and forceps.  The TMs were clear with good mobility on pneumatic otoscopy.  On hearing screening with the 512 tuning fork AC was greater than BC bilaterally.  On Dix-Hallpike testing he had no evidence of BPPV. Nasal: External nose without lesions. Septum relatively midline..  On anterior rhinoscopy patient has a large polyp in the left nasal airway superiorly and a smaller polyp in the right nasal airway superiorly. Oral: Lips and gums without lesions. Tongue and palate mucosa without lesions. Posterior oropharynx clear.  Patient is status post tonsillectomy. Neck: No palpable adenopathy or masses Respiratory: Breathing comfortably  Skin: No facial/neck lesions or rash noted.  Cerumen impaction removal  Date/Time: 06/06/2020 5:35 PM Performed by: Rozetta Nunnery, MD Authorized by: Rozetta Nunnery, MD   Consent:    Consent obtained:  Verbal   Consent given by:  Patient   Risks discussed:  Pain and bleeding Procedure details:    Location:  L ear and R ear   Procedure type: curette, suction and forceps   Post-procedure details:    Inspection:  TM intact and canal normal   Hearing quality:  Improved   Patient tolerance of procedure:   Tolerated well, no immediate complications Comments:     TMs are clear bilaterally with good mobility pneumatic otoscopy.    Assessment: Dizziness questionable etiology.  No clinical evidence of BPPV. Bilateral sinonasal polyps causing nasal obstruction. TMs are clear bilaterally.  Plan: Discussed with the patient concerning resolution of the serous otitis with clear TMs bilaterally. Reviewed with him that he does have nasal polyps that is causing some of his nasal obstruction and prescribed Flonase 2 sprays each nostril at night.  If he does not get adequate relief of nasal congestion could consider removal of sinonasal polyps however patient has been on Eliquis for over 5 years and this would have to be stopped in order to remove the polyps.  Hopefully they will reduce in size with use of the nasal steroid spray Flonase.   Radene Journey, MD   CC:

## 2020-06-30 DIAGNOSIS — E291 Testicular hypofunction: Secondary | ICD-10-CM | POA: Diagnosis not present

## 2020-06-30 DIAGNOSIS — R972 Elevated prostate specific antigen [PSA]: Secondary | ICD-10-CM | POA: Diagnosis not present

## 2020-06-30 DIAGNOSIS — N5201 Erectile dysfunction due to arterial insufficiency: Secondary | ICD-10-CM | POA: Diagnosis not present

## 2020-06-30 DIAGNOSIS — N390 Urinary tract infection, site not specified: Secondary | ICD-10-CM | POA: Diagnosis not present

## 2020-07-11 DIAGNOSIS — L821 Other seborrheic keratosis: Secondary | ICD-10-CM | POA: Diagnosis not present

## 2020-07-11 DIAGNOSIS — C4441 Basal cell carcinoma of skin of scalp and neck: Secondary | ICD-10-CM | POA: Diagnosis not present

## 2020-07-11 DIAGNOSIS — L820 Inflamed seborrheic keratosis: Secondary | ICD-10-CM | POA: Diagnosis not present

## 2020-07-11 DIAGNOSIS — D225 Melanocytic nevi of trunk: Secondary | ICD-10-CM | POA: Diagnosis not present

## 2020-07-11 DIAGNOSIS — L111 Transient acantholytic dermatosis [Grover]: Secondary | ICD-10-CM | POA: Diagnosis not present

## 2020-07-11 DIAGNOSIS — Z85820 Personal history of malignant melanoma of skin: Secondary | ICD-10-CM | POA: Diagnosis not present

## 2020-07-11 DIAGNOSIS — Z85828 Personal history of other malignant neoplasm of skin: Secondary | ICD-10-CM | POA: Diagnosis not present

## 2020-07-11 DIAGNOSIS — D1801 Hemangioma of skin and subcutaneous tissue: Secondary | ICD-10-CM | POA: Diagnosis not present

## 2020-07-11 DIAGNOSIS — D485 Neoplasm of uncertain behavior of skin: Secondary | ICD-10-CM | POA: Diagnosis not present

## 2020-07-11 DIAGNOSIS — L570 Actinic keratosis: Secondary | ICD-10-CM | POA: Diagnosis not present

## 2020-08-18 ENCOUNTER — Other Ambulatory Visit: Payer: Self-pay | Admitting: Internal Medicine

## 2020-08-18 ENCOUNTER — Telehealth: Payer: Self-pay | Admitting: Cardiovascular Disease

## 2020-08-18 MED ORDER — HYDRALAZINE HCL 25 MG PO TABS: 25.0000 mg | ORAL_TABLET | Freq: Two times a day (BID) | ORAL | 0 refills | Status: DC

## 2020-08-18 MED ORDER — AMLODIPINE BESYLATE 10 MG PO TABS: 10.0000 mg | ORAL_TABLET | Freq: Every day | ORAL | 0 refills | Status: DC

## 2020-08-18 NOTE — Telephone Encounter (Signed)
Eliquis 5mg  refill request received. Patient is 80 years old, weight-93.7kg, Crea-1.33 on 05/09/20, Diagnosis-Afib, and last seen by Dr. Rayann Heman on 06/05/2020. Dose is appropriate based on dosing criteria. Will send in refill to requested pharmacy.

## 2020-08-18 NOTE — Telephone Encounter (Signed)
*  STAT* If patient is at the pharmacy, call can be transferred to refill team.   1. Which medications need to be refilled? (please list name of each medication and dose if known)  hydrALAZINE (APRESOLINE) 25 MG tablet amLODipine (NORVASC) 10 MG tablet  2. Which pharmacy/location (including street and city if local pharmacy) is medication to be sent to? [place], [place]  3. Do they need a 30 day or 90 day supply? 90 day   Patient has an appointment with Dr. Claiborne Billings 11/14

## 2020-10-09 ENCOUNTER — Other Ambulatory Visit: Payer: Self-pay

## 2020-10-09 ENCOUNTER — Ambulatory Visit: Payer: Medicare HMO | Admitting: Internal Medicine

## 2020-10-09 VITALS — BP 142/60 | HR 79 | Resp 95 | Ht 72.0 in | Wt 203.2 lb

## 2020-10-09 DIAGNOSIS — I484 Atypical atrial flutter: Secondary | ICD-10-CM

## 2020-10-09 DIAGNOSIS — I4819 Other persistent atrial fibrillation: Secondary | ICD-10-CM

## 2020-10-09 DIAGNOSIS — I10 Essential (primary) hypertension: Secondary | ICD-10-CM | POA: Diagnosis not present

## 2020-10-09 NOTE — Patient Instructions (Addendum)
Medication Instructions:  Your physician recommends that you continue on your current medications as directed. Please refer to the Current Medication list given to you today.  Labwork: None ordered.  Testing/Procedures: None ordered.  Follow-Up: Your physician wants you to follow-up in: 10/22/21 at 10:45 am with  [name], MD    Any Other Special Instructions Will Be Listed Below (If Applicable).  If you need a refill on your cardiac medications before your next appointment, please call your pharmacy.

## 2020-10-09 NOTE — Progress Notes (Signed)
PCP: Marrian Salvage, [facility] Cardiologist: Dr Brantley Fling is a 80 y.o. male who presents today for routine electrophysiology followup.  Since last being seen in our clinic, the patient reports doing very well.   He has rare palpitations which are short lived (Typically < 30 seconds).  No sustained arrhythmias.  Today, he denies symptoms of chest pain, shortness of breath,  lower extremity edema, dizziness, presyncope, or syncope.  The patient is otherwise without complaint today.   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   ATRIAL FIBRILLATION ABLATION N/A 03/02/2020   Procedure: ATRIAL FIBRILLATION ABLATION;  Surgeon: [name], MD;  Location: [facility];  Service: Cardiovascular;  Laterality: N/A;   BASAL CELL CARCINOMA EXCISION  02/09/2017   CARDIOVERSION N/A 12/01/2014   Procedure: CARDIOVERSION;  Surgeon: Troy Sine, MD;  Location: [facility];  Service: Cardiovascular;  Laterality: N/A;   CARDIOVERSION N/A 02/22/2020   Procedure: CARDIOVERSION;  Surgeon: Buford Dresser, MD;  Location: [facility];  Service: Cardiovascular;  Laterality: N/A;   CATARACT EXTRACTION  2002, 2004   bilateral   CYSTOSCOPY     ELBOW SURGERY Left 1984   tendon repair left arm   INCISION AND DRAINAGE OF WOUND Left 08/28/2016   Procedure: IRRIGATION AND DEBRIDEMENT WOUND;  Surgeon: Iran Planas, MD;  Location: [facility];  Service: Orthopedics;  Laterality: Left;    LITHOTRIPSY     melanoma removal  2000   back   METACARPAL OSTEOTOMY Left 08/28/2016   Procedure: 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.;  Surgeon: Iran Planas, MD;  Location: [facility];  Service: Orthopedics;  Laterality: Left;   MICROLARYNGOSCOPY WITH LASER N/A 01/25/2015   Procedure: MICROLARYNGOSCOPY ;  Surgeon: Melida Quitter, MD;  Location: [facility];  Service: ENT;  Laterality: N/A;  Microlaryngoscopy with 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 90 tablet 0   apixaban (ELIQUIS) 5 MG TABS tablet Take 1 tablet by mouth twice daily 180 tablet 1   carvedilol (COREG) 6.25 MG tablet Take 1 tablet (6.25 mg total) by mouth 2 (two) times daily. 70 tablet 0   Cholecalciferol (DIALYVITE VITAMIN D 5000) 125 MCG (5000 UT) capsule Take 5,000 Units by mouth daily.     fluticasone (FLONASE) 50 MCG/ACT nasal spray Place 1 spray into both nostrils daily.     GLUCOSAMINE-CHONDROITIN PO Take 1 tablet by mouth daily.      hydrALAZINE (APRESOLINE) 25 MG tablet Take 1 tablet (  25 mg total) by mouth 2 (two) times daily. 180 tablet 0   irbesartan (AVAPRO) 150 MG tablet Take 1 tablet (150 mg total) by mouth daily. 90 tablet 0   Lutein 20 MG CAPS Take 20 mg by mouth daily.     Multiple Vitamin (MULTIVITAMIN) tablet Take 1 tablet by mouth daily.     OVER THE COUNTER MEDICATION Place 4-5 drops into both eyes as needed (dry eyes). Thera tears     Saw Palmetto, Serenoa repens, (SAW PALMETTO PO) Take 1 tablet by mouth daily.     testosterone cypionate (DEPOTESTOSTERONE CYPIONATE) 200 MG/ML injection Inject 100 mg into the muscle every Wednesday.     triamcinolone (KENALOG) 0.1 % Apply 1 application topically 2  (two) times daily as needed (red bumps).     No current facility-administered medications for this visit.    Physical Exam: Vitals:   10/09/20 1055  BP: (!) 142/60  Pulse: 79  Resp: (!) 95  Weight: 203 lb 3.2 oz (92.2 kg)  Height: 6' (1.829 m)    GEN- The patient is well appearing, alert and oriented x 3 today.   Head- normocephalic, atraumatic Eyes-  Sclera clear, conjunctiva pink Ears- hearing intact Oropharynx- clear Lungs- Clear to ausculation bilaterally, normal work of breathing Heart- Regular rate and rhythm, no murmurs, rubs or gallops, PMI not laterally displaced GI- soft, NT, ND, + BS Extremities- no clubbing, cyanosis, or edema  Wt Readings from Last 3 Encounters:  10/09/20 203 lb 3.2 oz (92.2 kg)  06/05/20 206 lb 9.6 oz (93.7 kg)  05/09/20 202 lb 6.4 oz (91.8 kg)    EKG tracing ordered today is personally reviewed and shows sinus  Assessment and Plan:  Persistent atrial fibrillation/ atrial flutter He has done very well post ablation off flecainide Chads2vasc score is 3.  He is on eliquis  2. HTN Continue coreg 6.'25mg'$  BID Home BP journal reviewed  3. AI Mild to moderate by prior echo Will need to keep BP controlled  Follow-up with Dr Claiborne Billings as scheduled  Return in a year  [name] MD, [facility] 10/09/2020 11:05 AM

## 2020-11-17 DIAGNOSIS — G4733 Obstructive sleep apnea (adult) (pediatric): Secondary | ICD-10-CM | POA: Diagnosis not present

## 2020-12-19 DIAGNOSIS — Z961 Presence of intraocular lens: Secondary | ICD-10-CM | POA: Diagnosis not present

## 2020-12-19 DIAGNOSIS — H52203 Unspecified astigmatism, bilateral: Secondary | ICD-10-CM | POA: Diagnosis not present

## 2020-12-22 DIAGNOSIS — R972 Elevated prostate specific antigen [PSA]: Secondary | ICD-10-CM | POA: Diagnosis not present

## 2020-12-22 DIAGNOSIS — E291 Testicular hypofunction: Secondary | ICD-10-CM | POA: Diagnosis not present

## 2020-12-25 ENCOUNTER — Encounter: Payer: Self-pay | Admitting: Cardiovascular Disease

## 2020-12-25 ENCOUNTER — Other Ambulatory Visit: Payer: Self-pay

## 2020-12-25 ENCOUNTER — Ambulatory Visit: Payer: Medicare HMO | Admitting: Cardiovascular Disease

## 2020-12-25 DIAGNOSIS — I350 Nonrheumatic aortic (valve) stenosis: Secondary | ICD-10-CM | POA: Diagnosis not present

## 2020-12-25 DIAGNOSIS — I4819 Other persistent atrial fibrillation: Secondary | ICD-10-CM | POA: Diagnosis not present

## 2020-12-25 DIAGNOSIS — G4733 Obstructive sleep apnea (adult) (pediatric): Secondary | ICD-10-CM | POA: Diagnosis not present

## 2020-12-25 DIAGNOSIS — I10 Essential (primary) hypertension: Secondary | ICD-10-CM

## 2020-12-25 DIAGNOSIS — Z7901 Long term (current) use of anticoagulants: Secondary | ICD-10-CM | POA: Diagnosis not present

## 2020-12-25 DIAGNOSIS — Z9989 Dependence on other enabling machines and devices: Secondary | ICD-10-CM | POA: Diagnosis not present

## 2020-12-25 DIAGNOSIS — I4891 Unspecified atrial fibrillation: Secondary | ICD-10-CM

## 2020-12-25 MED ORDER — IRBESARTAN 150 MG PO TABS: 150.0000 mg | ORAL_TABLET | Freq: Every day | ORAL | 3 refills | Status: DC

## 2020-12-25 MED ORDER — CARVEDILOL 3.125 MG PO TABS: 3.1250 mg | ORAL_TABLET | Freq: Two times a day (BID) | ORAL | 3 refills | Status: DC

## 2020-12-25 NOTE — Patient Instructions (Signed)
Medication Instructions:  Decrease carvedilol to 3.125 mg twice a day.  Continue all other medications.  *If you need a refill on your cardiac medications before your next appointment, please call your pharmacy*   Lab Work: none If you have labs (blood work) drawn today and your tests are completely normal, you will receive your results only by: [place] (if you have MyChart) OR A paper copy in the mail If you have any lab test that is abnormal or we need to change your treatment, we will call you to review the results.   Testing/Procedures: none   Follow-Up: At [facility], you and your health needs are our priority.  As part of our continuing mission to provide you with exceptional heart care, we have created designated Provider Care Teams.  These Care Teams include your primary Cardiologist (physician) and Advanced Practice Providers (APPs -  Physician Assistants and Nurse Practitioners) who all work together to provide you with the care you need, when you need it.  We recommend signing up for the patient portal called "MyChart".  Sign up information is provided on this After Visit Summary.  MyChart is used to connect with patients for Virtual Visits (Telemedicine).  Patients are able to view lab/test results, encounter notes, upcoming appointments, etc.  Non-urgent messages can be sent to your provider as well.   To learn more about what you can do with MyChart, go to [email or web address].    Your next appointment:   6 month(s)  The format for your next appointment:   In Person  Provider:   Shelva Majestic, MD

## 2020-12-25 NOTE — Progress Notes (Signed)
Patient ID: Mario [name], male   DOB: 11/29/1940, 80 y.o.   MRN: [id number]     Primary M.D.: Dr. Mallie Mussel   HPI:  Mario [name] is a 80 y.o. male who presents for a 12  month follow-up cardiology evaluation.  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  He underwent endoscopy by Dr. Erskine Emery and was noted to have an irregular heart rhythm with  sinus rhythm with PVCs and PACs when connected to the monitor.  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  He had undergone a cardiac catheterization in approximately 1985.  Last year  noticed chest tightness when working out and also had noticed significant increasing shortness of breath.  He has been aware of a somewhat irregular rhythm.  Over several months.  In July he was recently treated with diverticulitis with Cipro and Flagyl and had noticed increased palpitations at that time.  When I saw for initial evaluation on 09/27/2014 he was in atrial fibrillation which he was completely unaware of.  On further questioning, he omitted that he snores very loudly and has had significant episodes of witnessed apnea.  He can fall asleep during the day at any time if circumstances permit.  He has a history of hypertension.  Recently he had been taking atenolol 150 mg in the morning had taken testosterone injections once a week.  I changed his atenolol to 100 mg in the morning and 50 mg grams at night.  He was significantly hypertensive and I added amlodipine 5 mg to his medical regimen.  I scheduled him for a nuclear perfusion study which was done in 10/07/2014.  There were no ECG changes.  There was a small defect of mild severity in the basal inferior wall.  It was felt most likely this was diaphragmatic attenuation.  Ejection fraction was 59%.  An echo Doppler study revealed an EF  of 50-55% and raise the possibility of mild posterior basal hypokinesis.  He had mild AR and mild MR.  He was started on eliquis at his office visit.  On follow-up evaluation, his blood pressure was still elevated.  I increased his amlodipine to 7.5 mg in the morning and also started him on losartan 50 mg daily.  He underwent a sleep study which confirm my suspicion for obstructive sleep apnea.  Overall sleep apnea was mild with an AHI of 12.3 , but events were moderate with supine sleep with an AHI of 22.8 per hour. He has significant oxygen desaturation to a nadir of 81% and on the initial study had reduced sleep efficiency at 61.8% and evidence for loud snoring.  He subsequently underwent a CPAP titration trial and he was titrated up to 8 cm water pressure with excellent result.  He underwent successful cardioversion for his atrial fibrillation  On 12/01/2014. He admits to one brief episode following the cardioversion while sleeping before CPAP therapy was initiated and this had reverted back to normal rhythm in the morning when he awakened  CPAP was initiated on November 14, 2014. He has a ResMed Airfit P10 nasalpillow mask, medium size. A download was reviewed from 12/12/2014 through 01/10/2015. His compliance is excellent at 100% of usage.  He is averaging 8 hours and 24 minutes.  His AHI was still mildly elevated at 6.5,  but significant improved initially.  There is no significant leak.  He had undergone biopsy of his laryngeal nodule, which was negative.  When I saw him in January 2017, he complained of several days of being back in atrial fibrillation which seem to be precipitated after prolonged sneezing spell.  When I saw him, with his recurrent AF and normal LV function.  I recommended initiation of flecanide at 50 mg twice a day.  He was advised to reduce his atenolol from 75 g twice a day to 50 mg twice a day for 2 days and on the third day to reduce his atenolol to 25 g twice a day.  After 2  doses of flecanide he felt that his heart rhythm had again stabilized.  A CPAP download  from January 24, 2015 through 02/19/2015 demonstrated 100% compliance.  He averaged 9 hours of sleep per night.  He is set at an auto mode and his 95th percentile pressure was 13.7 with a maximum 16.9.  AHI was 7.9.  Of note, one week ago when I saw the patient.  We discussed turning off his ramp since at the beginning of his sleep.  He was having some difficulty getting adequate air. I rechecked a download for the past week when the has been off, AHI was slightly improved at 5.8/hr.  He feels well.  He has more energy.  He is exercising regularly.  He believes his sleep is good quality.  He denies residual daytime sleepiness.  In April 2016 he had been under increased mental stress and felt that  he went back into atrial fibrillation for approximately 12 hours, but then ultimately his rhythm stabilized.  When I saw him 6 weeks ago, he was in sinus rhythm.  At that time I increased his flecainide to 75 mg twice a day.  I also scheduled him for routine treadmill test to make certain he did not have any flecanide induced proarrhythmia.  This did not demonstrate any exercise-induced arrhythmia.  He was noted to have upsloping ST segment depression of approximately 1 mm with rapid resolution in less than 1 minute in early recovery.  Previously, his nuclear perfusion study did not show any ischemia.  He feels that his mental stress has improved since his daughter seems to be reconciling with her husband and is no longer planning to go through a divorce.  He denies any chest pain or shortness of breath.    When I saw him in October 2018.  He was unaware of any recurrent episodes of atrial fibrillation.  However, at times he had experienced an occasional skipped beat.   He was cutting wood and accidentally cut off the distal aspect of his fourth finger of his left hand several months ago.  He also had been taking testosterone  prescribed by his urologist, but recently his hemoglobin had increased and he reduced his testosterone dose in half.  He has continued to be on flecainide 75 mg twice a day in addition to eliquis 5 mg twice a day for anticoagulation.  He also has been on amlodipine 7.5 mg, and losartan 100 mg in addition to atenolol 6.25 mg twice a day for hypertension.  He continues to use CPAP and cannot sleep without it.  In his words "a life changor."  His CPAP set up day was 03/04/2016.  A download was obtained in the office today from April 9 through 06/18/2017.  He is 100% compliant in sleeping 8 hours and 13 minutes.  He has a ResMed AirSense 10 AutoSet unit with a minimum pressure at 8 and maximum of 20.  95th percentile pressure was 12.6 with a maximum average pressure at 14.6.  AHI was 3.2 but there were days of significant leak.  He just recently got a new mask.  He admits to being under increased stress.  His dad is been in hospice.   His wife' dad unfortunately died on 10/06/17 at age 73, 1 month prior to turning 3. This resulted in increased stress to Mario [name].  His blood pressure was noted to be somewhat labile during this.[name]  He is still working out at least 3 days/week lifting weights.  He continues to use CPAP with 100% compliance.  He states blood pressure typically has been in the 140s.  He needs to transient ankle edema particularly if he stands up for long duration while singing in the choir.  He is unaware of any breakthrough atrial fibrillation or rhythm disturbance.    I saw him in December 2019 at which time he was maintaining sinus rhythm, was 1% compliant with CPAP therapy.  QTc interval was stable.  On March 11, 2018 He developed an episode of lightheadedness and recurrent atrial fibrillation and was evaluated in the emergency room.  His heart rate was not fast and he was found to have up to 2-second pause.  Atenolol was discontinued and flecainide was further increased to 100 mg twice a  day.  He underwent cardioversion in the emergency room with restoration of sinus rhythm.  He was subsequently seen on March 13, 2018 by Mammie Russian, PA-C and was noted to have frequent PACs and PVCs.  He was started back on low-dose beta-blocker therapy with carvedilol 3.125 mg twice a day.  When seen several weeks later on March 31, 2018 heart rate was stable.  ECG showed sinus rhythm and QTc interval was normal at 393 ms.  However hydralazine was added to his regimen for mild blood pressure elevation.   Since he was last seen by Almyra Deforest, PAC, he has felt well.  He has continued to be very active working out at [employer] and now that the gym is closed due to the curve at 19 pandemic, he has been active building a deck at his house.  He lives outside [place] on a 12 acre plot of land.   I evaluated hum in a telemedicine vsist on 05/12/2018.  He was unaware of any recurrent atrial fibrillation.  He denies any anginal type chest pain symptoms.  He continues to use CPAP with 100% compliance.  When asked how his blood pressures have been running he states typically his blood pressure runs in the 1 20-1 30 range with diastolics in the 57S to 17B.  He underwent a repeat echo Doppler study on April 30, 2018.  This shows normal systolic function with an EF of 60 to 65%.  There is mild to moderate biatrial enlargement.  There is evidence for mild aortic stenosis with a mean gradient of 10 and peak gradient of 18.  He had mild aortic root dilatation at 41 mm.    When I last saw him on December 27, 2020 Mario [name] has been back to the gym and  has been working out with significant weights. He had noticed increased heart rates after he had taken his first and second vaccination with PVCs. He denies any chest pain. He continues to be on amlodipine 10 mg, carvedilol 3.125 mg twice  a day, flecainide 100 mg twice a day, as well as losartan 100 mg daily. He is unaware of recurrent atrial fibrillation. He continues to use  CPAP with excellent compliance. I obtained a download in the office today from October 1 through December 27, 2019 with average CPAP use is 9 hours and 17 minutes. He has 100% compliant. His CPAP is set at a range of 8 to 20 cm and his AHI is 2.2. 95th percentile pressure is 12.4 with a maximum average pressure of 15.1.   Since I last saw him, he has had evaluations with Clint Fenton and Dr. Rayann Heman for atrial fibrillation after failing therapy with flecainide in January 2022.  He had been exercising heavily trying to get ready for a weightlifting competition.  He apparently underwent cardioversion in January 2022 and ablation by Dr. Rayann Heman on March 02, 2020 for persistent atrial fibrillation at which time he was also found to have isthmus dependent right atrial flutter and multiple left atrial flutter circuits.  Subsequently he did develop a significant groin hematoma which ultimately stabilized.  Presently, Mario [name] is unaware of recurrent atrial fibrillation.  He is now off flecainide and carvedilol but does admit to occasional palpitations.  He has lost approximately 20 pounds.  He is training for a benchpress competition.  He continues to use CPAP and a download today from October 13 through December 22, 2020 shows 100% compliance with average use at 8 hours and 56 minutes.  At his pressure range of 10 to 20 cm of water his 95th percentile pressure is 13.1 with maximum average pressure 15.0 and AHI 2.2/h.  He he presents for evaluation.   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   ATRIAL FIBRILLATION ABLATION N/A 03/02/2020   Procedure: ATRIAL FIBRILLATION ABLATION;  Surgeon: [name], MD;  Location: [facility];  Service: Cardiovascular;  Laterality: N/A;   BASAL CELL CARCINOMA EXCISION  02/09/2017   CARDIOVERSION N/A 12/01/2014   Procedure: CARDIOVERSION;  Surgeon: Troy Sine, MD;  Location: [facility];  Service: Cardiovascular;  Laterality: N/A;   CARDIOVERSION N/A 02/22/2020   Procedure: CARDIOVERSION;  Surgeon: Buford Dresser, MD;  Location: [facility];  Service: Cardiovascular;  Laterality: N/A;   CATARACT EXTRACTION  2002, 2004   bilateral   CYSTOSCOPY     ELBOW SURGERY Left 1984   tendon repair left arm   INCISION AND DRAINAGE OF WOUND Left 08/28/2016   Procedure: IRRIGATION AND DEBRIDEMENT WOUND;  Surgeon: Iran Planas, MD;  Location: [facility];  Service: Orthopedics;  Laterality: Left;   LITHOTRIPSY     melanoma removal  2000   back   METACARPAL OSTEOTOMY Left 08/28/2016   Procedure: 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.;  Surgeon: Iran Planas, MD;  Location: [facility];  Service: Orthopedics;  Laterality: Left;   MICROLARYNGOSCOPY WITH LASER N/A  01/25/2015   Procedure: MICROLARYNGOSCOPY ;  Surgeon: Melida Quitter, MD;  Location: [facility];  Service: ENT;  Laterality: N/A;  Microlaryngoscopy with 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 with CO2 laser    TENDON REPAIR Right 2012   right   TONSILLECTOMY     UPPER GI ENDOSCOPY      Allergies  Allergen Reactions   Contrast Media [Iodinated Diagnostic Agents] Shortness Of Breath, Rash and Other (See Comments)    First noted after Coronary CTA 02/24/20. Pt experienced wheezing, shortness of breath and diffuse rash/welts over face and trunk.   Lisinopril Other (See Comments)    REACTION: severe GI upset requiring EGD--temporally related to lisinopril--tolerated micardis    Current Outpatient Medications   Medication Sig Dispense Refill   amLODipine (NORVASC) 10 MG tablet Take 1 tablet (10 mg total) by mouth daily. 90 tablet 0   apixaban (ELIQUIS) 5 MG TABS tablet Take 1 tablet by mouth twice daily 180 tablet 1   carvedilol (COREG) 3.125 MG tablet Take 1 tablet (3.125 mg total) by mouth 2 (two) times daily. 180 tablet 3   Cholecalciferol (DIALYVITE VITAMIN D 5000) 125 MCG (5000 UT) capsule Take 5,000 Units by mouth daily.     fluticasone (FLONASE) 50 MCG/ACT nasal spray Place 1 spray into both nostrils daily.     GLUCOSAMINE-CHONDROITIN PO Take 1 tablet by mouth daily.      hydrALAZINE (APRESOLINE) 25 MG tablet Take 1 tablet (25 mg total) by mouth 2 (two) times daily. 180 tablet 0   Lutein 20 MG CAPS Take 20 mg by mouth daily.     Multiple Vitamin (MULTIVITAMIN) tablet Take 1 tablet by mouth daily.     OVER THE COUNTER MEDICATION Place 4-5 drops into both eyes as needed (dry eyes). Thera tears     Saw Palmetto, Serenoa repens, (SAW PALMETTO PO) Take 1 tablet by mouth daily.     testosterone cypionate (DEPOTESTOSTERONE CYPIONATE) 200 MG/ML injection Inject 100 mg into the muscle every Wednesday.     triamcinolone (KENALOG) 0.1 % Apply 1 application topically 2 (two) times daily as needed (red bumps).     irbesartan (AVAPRO) 150 MG tablet Take 1 tablet (150 mg total) by mouth daily. 90 tablet 3   No current facility-administered medications for this visit.    Social History   Socioeconomic History   Marital status: Married    Spouse name: Not on file   Number of children: 2   Years of education: Not on file   Highest education level: Not on file  Occupational History   Occupation: retired  Tobacco Use   Smoking status: Former    Types: Cigarettes    Start date: 11/20/1976    Quit date: 1986    Years since quitting: 36.9   Smokeless tobacco: Never   Tobacco comments:    quit smoking 1986 ish  Vaping Use   Vaping Use: Never used  Substance and Sexual Activity   Alcohol use: Yes     Alcohol/week: 21.0 standard drinks    Types: 21 Standard drinks or equivalent per week    Comment: 2-3 beers per night   Drug use: No   Sexual activity: Not on file  Other Topics Concern   Not on file  Social History Narrative   Lives in [place].  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  He has 2 children and 2 grandchildren.  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  He completed 12th grade of education.  Remotely he had smoked but quit in 1977.  He drinks 3 beers per day.  He has been exercising fairly regularly at least 4 days per week including weightlifting and cardio for~ 2 hours per session.  Family History  Problem Relation Age of Onset   Heart disease Mother    Stroke Father    Other Father        brain tumor-benign   Heart disease Sister    Heart disease Sister    Hypertension Sister    Colon cancer Neg Hx    Stomach cancer Neg Hx    Esophageal cancer Neg Hx    Pancreatic cancer Neg Hx    Liver disease Neg Hx    Family history is notable that his mother died at age 21 with heart disease and had atrial fibrillation and congestive heart failure.  Father died at 79 with a brain tumor.  He has 2 sisters and both have hypertension.   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 fourth finger of his left hand due to a sawing accident Hematologic/Oncologic: Negative; no easy bruising, bleeding Endocrine: Negative; no heat/cold intolerance; no diabetes Neuro: Negative; no changes in balance, headaches Skin: Negative; No rashes or skin lesions Psychiatric: Negative; No behavioral problems, depression Sleep: Positive for previously loud snoring, frequent awakenings, nonrestorative sleep, and daytime sleepiness, hypersomnolence, now on CPAP therapy with resolution of symptoms; no bruxism, restless legs, hypnogagnic hallucinations Other comprehensive 14 point system review is negative   Physical Exam Pulse 75   Ht 6' (1.829 m)   Wt 202 lb 6.4 oz (91.8 kg)   SpO2 98%   BMI 27.45 kg/m    Initial lying blood pressure 126/71, pulse 84, sitting blood pressure 142/71, pulse 90 and standing blood pressure 144/73, pulse 86  Wt Readings from Last 3 Encounters:  12/25/20 202 lb 6.4 oz (91.8 kg)  10/09/20 203 lb 3.2 oz (92.2 kg)  06/05/20 206 lb 9.6 oz (93.7 kg)   General: Alert, oriented, no distress.  Skin: normal turgor, no rashes, warm and dry HEENT: Normocephalic, atraumatic. Pupils equal round and reactive to light; sclera anicteric; extraocular muscles intact; Nose without nasal septal hypertrophy Mouth/Parynx benign; Mallinpatti scale 3 Neck: No JVD, no carotid bruits; normal carotid upstroke Lungs: clear to ausculatation and percussion; no wheezing or rales Chest wall: without tenderness to palpitation Heart: PMI not displaced, RRR, s1 s2 normal, 1/6 systolic murmur, no diastolic murmur, no rubs, gallops, thrills, or heaves Abdomen: soft, nontender; no hepatosplenomehaly, BS+; abdominal aorta nontender and not dilated by palpation. Back: no CVA tenderness Pulses 2+ Musculoskeletal: full range of motion, normal strength, no joint deformities Extremities: no clubbing cyanosis  or edema, Homan's sign negative  Neurologic: grossly nonfocal; Cranial nerves grossly  wnl Psychologic: Normal mood and affect   December 25, 2020 ECG (independently read by me): NSR at 78, normal intervals  November 2021 ECG (independently read by me): NSR at 68;nonspecific  T changes, normal interval with QTc 414 msec  December 2019 ECG (independently read by me): Sinus bradycardia 55 bpm.  No ectopy.  Normal intervals.  May 2019 ECG (independently read by me): Bradycardia 56 bpm.  Nonspecific interventricular conduction delay.  QTc interval not 391 ms  October 2018 ECG (independently read by me): Sinus bradycardia 56 bpm.  No ectopy.  Normal intervals per  06/05/2016 ECG (independently read by me): Normal sinus rhythm at 61 bpm.  Nonspecific T changes.  QTc interval 390 ms.  October 2017 ECG (independently read by me): Sinus bradycardia 57 bpm.  Nonspecific ST changes.  QTc interval 393 ms.  06/30/2015 ECG (independently read by me): Sinus bradycardia at 56 bpm.  No significant ST-T changes.  QTc interval 389 ms.  05/16/2015 ECG (independently read by me): Sinus bradycardia 59 bpm.  QTc interval 376 ms.  No significant ST changes.  02/23/2015 ECG (independently read by me): Sinus bradycardia 59 bpm.  QTc interval 390 ms.  Nonspecific T changes.  02/17/15 ECG (independently read by me): Atrial fibrillation at 58 bpm.  Nonspecific ST changes.  QTc interval 380 ms.  01/12/2015 ECG  (independently read by me):  Normal sinus rhythm at 62 bpm. Nonspecific ST-T changes.  September 2016 ECG (independently read by me): Atrial fibrillation with a slow ventricular response in the 50s.  QTc interval 364 ms.  Prior CG (independently read by me):  Atrial fibrillation with ventricular rate at 52 bpm.  No significant ST-T change.  09/27/2014 ECG (independently read by me): Atrial fibrillation with ventricular rate at 59 bpm.  Nonspecific ST-T changes.  LABS:  BMP Latest Ref Rng & Units 05/09/2020  03/22/2020 03/03/2020  Glucose 70 - 99 mg/dL 91 79 121(H)  BUN 6 - 23 mg/dL 29(H) 25(H) 19  Creatinine 0.40 - 1.50 mg/dL 1.33 1.26 1.14  BUN/Creat Ratio 10 - 24 - - -  Sodium 135 - 145 mEq/L 137 135 134(L)  Potassium 3.5 - 5.1 mEq/L 5.1 4.6 4.6  Chloride 96 - 112 mEq/L 101 101 105  CO2 19 - 32 mEq/L 30 31 21(L)  Calcium 8.4 - 10.5 mg/dL 9.9 10.1 8.8(L)     Hepatic Function Latest Ref Rng & Units 05/09/2020 03/22/2020 03/10/2019  Total Protein 6.0 - 8.3 g/dL 6.9 7.3 6.8  Albumin 3.5 - 5.2 g/dL 4.2 4.2 4.2  AST 0 - 37 U/L '20 19 19  ' ALT 0 - 53 U/L '14 16 15  ' Alk Phosphatase 39 - 117 U/L 68 77 64  Total Bilirubin 0.2 - 1.2 mg/dL 0.6 0.6 0.6    CBC Latest Ref Rng & Units 05/09/2020 03/22/2020 03/03/2020  WBC 4.0 - 10.5 K/uL 6.6 6.3 10.0  Hemoglobin 13.0 - 17.0 g/dL 14.6 15.0 14.9  Hematocrit 39.0 - 52.0 % 41.9 43.4 44.5  Platelets 150.0 - 400.0 K/uL 190.0 194.0 183   Lab Results  Component Value Date   MCV 92.1 05/09/2020   MCV 93.1 03/22/2020   MCV 94.3 03/03/2020   Lab Results  Component Value Date   TSH 3.360 06/13/2017   Lab Results  Component Value Date   HGBA1C 4.9 05/06/2006     BNP    Component Value Date/Time   BNP 250.0 (H) 03/11/2018 1113    ProBNP No results found for: PROBNP   Lipid  Panel     Component Value Date/Time   CHOL 166 05/09/2020 1137   CHOL 155 06/13/2017 0951   TRIG 158.0 (H) 05/09/2020 1137   HDL 42.60 05/09/2020 1137   HDL 45 06/13/2017 0951   CHOLHDL 4 05/09/2020 1137   VLDL 31.6 05/09/2020 1137   LDLCALC 92 05/09/2020 1137   LDLCALC 84 06/13/2017 0951    RADIOLOGY: No results found.  IMPRESSION:  1. Essential hypertension   2. Persistent atrial fibrillation (HCC) and atrial flutter: s/p ablation January 2022   3. OSA on CPAP   4. Mild aortic stenosis   5. Anticoagulation adequate     ASSESSMENT AND PLAN: Mario [name] is an active young appearing 5 -year-old male who was found to be in atrial fibrillation in August 2016  and was started on anticoagulation.  He had undergone initial cardioversion in October 2016 and develop recurrent atrial fibrillation in January 2017 converted to sinus rhythm with institution of flecanide.  He had been maintaining sinus rhythm with good blood pressure control on amlodipine, carvedilol, hydralazine and losartan in addition to his flecainide.  Unfortunately, in January 2022 he began to fail flecainide and developed recurrent persistent atrial fibrillation.  He ultimately underwent successful ablationby Dr. Rayann Heman on March 02, 2020 at which time he was also found to have in addition to persistent A. fib isthmus dependent right atrial flutter and multiple left atrial flutter circuits which were successfully ablated.  Subsequently, he has been taken off flecainide and also carvedilol.  He has noted some occasional palpitations but is unaware of recurrent A. fib.  He is on amlodipine 10 mg, hydralazine 25 mg twice a day, and his dose of irbesartan had been reduced to 150 mg.  With his palpitations and blood pressure I have suggested re-initiation of low-dose carvedilol at 3.125 mg twice a day which should provide benefit both for blood pressure as well as palpitations.  He continues to be on Eliquis 5 mg twice a day for anticoagulation.  Serum creatinine in March 2022 was 1.33.  He is trying to lose weight and has lost approximately 20 pounds purposefully.  He has mild aortic stenosis noted on his echo.  He continues to use CPAP with 100% compliance.  AHI is 2.2 on current settings with excellent sleep duration 8 hours and 56 minutes per night.  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   Troy Sine, MD, [facility] 12/31/2020 12:40 PM

## 2020-12-29 DIAGNOSIS — N401 Enlarged prostate with lower urinary tract symptoms: Secondary | ICD-10-CM | POA: Diagnosis not present

## 2020-12-29 DIAGNOSIS — R3912 Poor urinary stream: Secondary | ICD-10-CM | POA: Diagnosis not present

## 2020-12-29 DIAGNOSIS — R351 Nocturia: Secondary | ICD-10-CM | POA: Diagnosis not present

## 2020-12-29 DIAGNOSIS — N5201 Erectile dysfunction due to arterial insufficiency: Secondary | ICD-10-CM | POA: Diagnosis not present

## 2020-12-29 DIAGNOSIS — E291 Testicular hypofunction: Secondary | ICD-10-CM | POA: Diagnosis not present

## 2020-12-29 DIAGNOSIS — R972 Elevated prostate specific antigen [PSA]: Secondary | ICD-10-CM | POA: Diagnosis not present

## 2020-12-31 ENCOUNTER — Encounter: Payer: Self-pay | Admitting: Cardiovascular Disease

## 2021-01-11 DIAGNOSIS — C44319 Basal cell carcinoma of skin of other parts of face: Secondary | ICD-10-CM | POA: Diagnosis not present

## 2021-01-11 DIAGNOSIS — L111 Transient acantholytic dermatosis [Grover]: Secondary | ICD-10-CM | POA: Diagnosis not present

## 2021-01-11 DIAGNOSIS — C44719 Basal cell carcinoma of skin of left lower limb, including hip: Secondary | ICD-10-CM | POA: Diagnosis not present

## 2021-01-11 DIAGNOSIS — L570 Actinic keratosis: Secondary | ICD-10-CM | POA: Diagnosis not present

## 2021-01-11 DIAGNOSIS — D485 Neoplasm of uncertain behavior of skin: Secondary | ICD-10-CM | POA: Diagnosis not present

## 2021-01-11 DIAGNOSIS — D1801 Hemangioma of skin and subcutaneous tissue: Secondary | ICD-10-CM | POA: Diagnosis not present

## 2021-01-11 DIAGNOSIS — Z85820 Personal history of malignant melanoma of skin: Secondary | ICD-10-CM | POA: Diagnosis not present

## 2021-01-11 DIAGNOSIS — L821 Other seborrheic keratosis: Secondary | ICD-10-CM | POA: Diagnosis not present

## 2021-01-11 DIAGNOSIS — Z85828 Personal history of other malignant neoplasm of skin: Secondary | ICD-10-CM | POA: Diagnosis not present

## 2021-01-11 DIAGNOSIS — L820 Inflamed seborrheic keratosis: Secondary | ICD-10-CM | POA: Diagnosis not present

## 2021-01-16 ENCOUNTER — Other Ambulatory Visit: Payer: Self-pay | Admitting: Urology

## 2021-01-16 DIAGNOSIS — R972 Elevated prostate specific antigen [PSA]: Secondary | ICD-10-CM

## 2021-02-12 ENCOUNTER — Other Ambulatory Visit: Payer: Self-pay | Admitting: Internal Medicine

## 2021-02-13 ENCOUNTER — Ambulatory Visit
Admission: RE | Admit: 2021-02-13 | Discharge: 2021-02-13 | Disposition: A | Discharge status: Home / Self Care | Payer: Medicare HMO | Source: Ambulatory Visit | Attending: Urology | Admitting: Urology

## 2021-02-13 ENCOUNTER — Other Ambulatory Visit: Payer: Self-pay

## 2021-02-13 DIAGNOSIS — R972 Elevated prostate specific antigen [PSA]: Secondary | ICD-10-CM

## 2021-02-13 MED ORDER — GADOBENATE DIMEGLUMINE 529 MG/ML IV SOLN
19.0000 mL | Freq: Once | INTRAVENOUS | Status: AC | PRN
  Administered 2021-02-13: 19 mL via INTRAVENOUS

## 2021-02-13 NOTE — Telephone Encounter (Signed)
Eliquis 5 mg refill request received. Patient is 81 years old, weight- 91.8 kg, Crea- 1.33 on 05/09/20, Diagnosis- afib, and last seen by Dr. Claiborne Billings on 12/25/20. Dose is appropriate based on dosing criteria. Will send in refill to requested pharmacy.

## 2021-02-21 DIAGNOSIS — Z85820 Personal history of malignant melanoma of skin: Secondary | ICD-10-CM | POA: Diagnosis not present

## 2021-02-21 DIAGNOSIS — C44319 Basal cell carcinoma of skin of other parts of face: Secondary | ICD-10-CM | POA: Diagnosis not present

## 2021-02-28 ENCOUNTER — Telehealth: Payer: Self-pay

## 2021-02-28 DIAGNOSIS — R931 Abnormal findings on diagnostic imaging of heart and coronary circulation: Secondary | ICD-10-CM | POA: Insufficient documentation

## 2021-02-28 NOTE — Telephone Encounter (Signed)
Patient with diagnosis of afib on Eliquis for anticoagulation.    Procedure: prostate biopsy Date of procedure: TBD  CHA2DS2-VASc Score = 4  This indicates a 4.8% annual risk of stroke. The patient's score is based upon: CHF History: 0 HTN History: 1 Diabetes History: 0 Stroke History: 0 Vascular Disease History: 1 Age Score: 2 Gender Score: 0   CrCl 47mL/min Platelet count 190K  Per office protocol, patient can hold Eliquis for 3 days prior to procedure as requested.

## 2021-02-28 NOTE — Telephone Encounter (Signed)
Clinical pharmacist to review Eliquis 

## 2021-02-28 NOTE — Telephone Encounter (Signed)
° °  Pre-operative Risk Assessment    Patient Name: Mario [name]  DOB: 1941/01/02 MRN: [id number]      Request for Surgical Clearance    Procedure:   Prostate Bx/ V/S  Date of Surgery:  Clearance TBD                                 Surgeon:  Dr. Link Snuffer Surgeon's Group or Practice Name:  [facility]  Phone number:  [phone] Fax number:  [phone]   Type of Clearance Requested:   - Medical  - Pharmacy:  Hold Apixaban (Eliquis) hold 3 days prior to procedure   Type of Anesthesia:  Not Indicated   Additional requests/questions:    Tana Conch   02/28/2021, 9:57 AM

## 2021-03-01 NOTE — Telephone Encounter (Signed)
° °  Name: Mario [name]  DOB: October 23, 1940  MRN: [id number]   Primary Cardiologist: Shelva Majestic, MD  Chart reviewed as part of pre-operative protocol coverage. Patient was contacted 03/01/2021 in reference to pre-operative risk assessment for pending surgery as outlined below.  Mario [name] was last seen on 12/25/2020 by Dr. Claiborne Billings.  Since that day, Mario [name] has done well without exertional chest pain or worsening dyspnea.  He can clearly accomplish more than 4 METS of activity.  Therefore, based on ACC/AHA guidelines, the patient would be at acceptable risk for the planned procedure without further cardiovascular testing.   Patient may hold Eliquis for 3 days prior to the surgery and restart as soon as possible afterward at the surgeon's discretion.  The patient was advised that if he develops new symptoms prior to surgery to contact our office to arrange for a follow-up visit, and he verbalized understanding.  I will route this recommendation to the requesting party via Epic fax function and remove from pre-op pool. Please call with questions.  [place], [place] 03/01/2021, 2:26 PM

## 2021-03-14 DIAGNOSIS — C61 Malignant neoplasm of prostate: Secondary | ICD-10-CM

## 2021-03-14 HISTORY — DX: Malignant neoplasm of prostate: C61

## 2021-03-19 DIAGNOSIS — D075 Carcinoma in situ of prostate: Secondary | ICD-10-CM | POA: Diagnosis not present

## 2021-03-19 DIAGNOSIS — C61 Malignant neoplasm of prostate: Secondary | ICD-10-CM | POA: Diagnosis not present

## 2021-03-19 DIAGNOSIS — R972 Elevated prostate specific antigen [PSA]: Secondary | ICD-10-CM | POA: Diagnosis not present

## 2021-03-22 DIAGNOSIS — C61 Malignant neoplasm of prostate: Secondary | ICD-10-CM | POA: Diagnosis not present

## 2021-03-22 DIAGNOSIS — R3912 Poor urinary stream: Secondary | ICD-10-CM | POA: Diagnosis not present

## 2021-03-22 DIAGNOSIS — N401 Enlarged prostate with lower urinary tract symptoms: Secondary | ICD-10-CM | POA: Diagnosis not present

## 2021-03-22 DIAGNOSIS — E291 Testicular hypofunction: Secondary | ICD-10-CM | POA: Diagnosis not present

## 2021-03-22 DIAGNOSIS — R351 Nocturia: Secondary | ICD-10-CM | POA: Diagnosis not present

## 2021-03-30 ENCOUNTER — Emergency Department (HOSPITAL_COMMUNITY): Payer: Medicare HMO

## 2021-03-30 ENCOUNTER — Other Ambulatory Visit: Payer: Self-pay

## 2021-03-30 ENCOUNTER — Telehealth: Payer: Self-pay | Admitting: Internal Medicine

## 2021-03-30 ENCOUNTER — Emergency Department (HOSPITAL_COMMUNITY)
Admission: EM | Admit: 2021-03-30 | Discharge: 2021-03-30 | Disposition: A | Discharge status: Home / Self Care | Payer: Medicare HMO | Attending: Emergency Medicine | Admitting: Emergency Medicine

## 2021-03-30 DIAGNOSIS — Z7901 Long term (current) use of anticoagulants: Secondary | ICD-10-CM | POA: Insufficient documentation

## 2021-03-30 DIAGNOSIS — Z79899 Other long term (current) drug therapy: Secondary | ICD-10-CM | POA: Insufficient documentation

## 2021-03-30 DIAGNOSIS — I4891 Unspecified atrial fibrillation: Secondary | ICD-10-CM | POA: Diagnosis not present

## 2021-03-30 DIAGNOSIS — R079 Chest pain, unspecified: Secondary | ICD-10-CM | POA: Diagnosis not present

## 2021-03-30 DIAGNOSIS — R9431 Abnormal electrocardiogram [ECG] [EKG]: Secondary | ICD-10-CM | POA: Diagnosis not present

## 2021-03-30 DIAGNOSIS — R002 Palpitations: Secondary | ICD-10-CM | POA: Diagnosis not present

## 2021-03-30 LAB — COMPREHENSIVE METABOLIC PANEL
ALT: 22 U/L (ref 0–44)
AST: 25 U/L (ref 15–41)
Albumin: 4 g/dL (ref 3.5–5.0)
Alkaline Phosphatase: 69 U/L (ref 38–126)
Anion gap: 8 (ref 5–15)
BUN: 23 mg/dL (ref 8–23)
CO2: 23 mmol/L (ref 22–32)
Calcium: 9.5 mg/dL (ref 8.9–10.3)
Chloride: 103 mmol/L (ref 98–111)
Creatinine, Ser: 1.11 mg/dL (ref 0.61–1.24)
GFR, Estimated: >60 mL/min (ref >60)
Glucose, Bld: 89 mg/dL (ref 70–99)
Potassium: 4.5 mmol/L (ref 3.5–5.1)
Sodium: 134 mmol/L — ABNORMAL LOW (ref 135–145)
Total Bilirubin: 0.6 mg/dL (ref 0.3–1.2)
Total Protein: 7.1 g/dL (ref 6.5–8.1)

## 2021-03-30 LAB — CBC
HCT: 48.7 % (ref 39.0–52.0)
Hemoglobin: 16.9 g/dL (ref 13.0–17.0)
MCH: 32 pg (ref 26.0–34.0)
MCHC: 34.7 g/dL (ref 30.0–36.0)
MCV: 92.2 fL (ref 80.0–100.0)
Platelets: 200 10*3/uL (ref 150–400)
RBC: 5.28 MIL/uL (ref 4.22–5.81)
RDW: 12.9 % (ref 11.5–15.5)
WBC: 6.2 10*3/uL (ref 4.0–10.5)
nRBC: 0 % (ref 0.0–0.2)

## 2021-03-30 LAB — TROPONIN I (HIGH SENSITIVITY)
Troponin I (High Sensitivity): 9 ng/L (ref <18)
Troponin I (High Sensitivity): 8 ng/L (ref <18)

## 2021-03-30 NOTE — Telephone Encounter (Signed)
Reviewed w/ Dr. Rayann Heman and nurse. Due to being symptomatic pt advised to go to ED for evaluation. Reminded he should not be driving. Patient verbalized understanding and agreeable to plan.

## 2021-03-30 NOTE — ED Triage Notes (Signed)
Pt here POV d/t chest pain with SOB, weakness and elevated blood pressure. Hx of Afib with ablasion. Recent Dx of prostate cancer. Pt anxious in triage and tearful. Denies urinary symptoms.

## 2021-03-30 NOTE — Discharge Instructions (Signed)
Follow-up with atrial fibrillation clinic.  Continue medications.  If you become extremely symptomatic with severe crushing chest pain, persistent elevated heart rate greater than 150s please return for evaluation.  Heart rate is well within normal range at this time.

## 2021-03-30 NOTE — ED Provider Notes (Signed)
[facility] EMERGENCY DEPARTMENT Provider Note   CSN: [id number] Arrival date & time: 03/30/21  1203     History  Chief Complaint  Patient presents with   Chest Pain    Mario [name] is a 81 y.o. male.  The history is provided by the patient.  Palpitations Palpitations quality:  Irregular Onset quality:  Gradual Duration:  1 day Progression:  Partially resolved Chronicity:  New Context comment:  Patient feels like he is back in A-fib.  May be some chest pain today but none currently.  History of ablation.  Is on Eliquis and Coreg. Relieved by:  Nothing Worsened by:  Nothing Associated symptoms: chest pain   Associated symptoms: no back pain, no chest pressure, no cough, no diaphoresis, no dizziness, no hemoptysis, no leg pain, no lower extremity edema, no malaise/fatigue, no nausea, no near-syncope, no numbness, no orthopnea, no PND, no shortness of breath, no syncope, no vomiting and no weakness   Risk factors: hx of atrial fibrillation       Home Medications Prior to Admission medications   Medication Sig Start Date End Date Taking? Authorizing Provider  amLODipine (NORVASC) 10 MG tablet Take 1 tablet (10 mg total) by mouth daily. 08/18/20   Allred, Jeneen Rinks, MD  apixaban Arne Cleveland) 5 MG TABS tablet Take 1 tablet by mouth twice daily 02/13/21   Troy Sine, MD  carvedilol (COREG) 3.125 MG tablet Take 1 tablet (3.125 mg total) by mouth 2 (two) times daily. 12/25/20 03/25/21  Troy Sine, MD  Cholecalciferol (DIALYVITE VITAMIN D 5000) 125 MCG (5000 UT) capsule Take 5,000 Units by mouth daily.    [provider]  fluticasone (FLONASE) 50 MCG/ACT nasal spray Place 1 spray into both nostrils daily. 09/09/20   [provider]  GLUCOSAMINE-CHONDROITIN PO Take 1 tablet by mouth daily.     [provider]  hydrALAZINE (APRESOLINE) 25 MG tablet Take 1 tablet (25 mg total) by mouth 2 (two) times daily. 08/18/20   Allred, Jeneen Rinks, MD  irbesartan  (AVAPRO) 150 MG tablet Take 1 tablet (150 mg total) by mouth daily. 12/25/20   Troy Sine, MD  Lutein 20 MG CAPS Take 20 mg by mouth daily.    [provider]  Multiple Vitamin (MULTIVITAMIN) tablet Take 1 tablet by mouth daily.    [provider]  OVER THE COUNTER MEDICATION Place 4-5 drops into both eyes as needed (dry eyes). Thera tears    [provider]  Saw Palmetto, Serenoa repens, (SAW PALMETTO PO) Take 1 tablet by mouth daily.    [provider]  testosterone cypionate (DEPOTESTOSTERONE CYPIONATE) 200 MG/ML injection Inject 100 mg into the muscle every Wednesday. 08/09/14   [provider]  triamcinolone (KENALOG) 0.1 % Apply 1 application topically 2 (two) times daily as needed (red bumps). 01/21/20   [provider]      Allergies    Contrast media [iodinated contrast media] and Lisinopril    Review of Systems   Review of Systems  Constitutional:  Negative for diaphoresis and malaise/fatigue.  Respiratory:  Negative for cough, hemoptysis and shortness of breath.   Cardiovascular:  Positive for chest pain and palpitations. Negative for orthopnea, syncope, PND and near-syncope.  Gastrointestinal:  Negative for nausea and vomiting.  Musculoskeletal:  Negative for back pain.  Neurological:  Negative for dizziness, weakness and numbness.   Physical Exam Updated Vital Signs BP (!) 161/86    Pulse 87    Temp 97.8 F (36.6  C) (Oral)    Resp 16    Ht 6' (1.829 m)    Wt 91.2 kg    SpO2 98%    BMI 27.26 kg/m  Physical Exam Vitals and nursing note reviewed.  Constitutional:      General: He is not in acute distress.    Appearance: He is well-developed.  HENT:     Head: Normocephalic and atraumatic.  Eyes:     Extraocular Movements: Extraocular movements intact.     Conjunctiva/sclera: Conjunctivae normal.     Pupils: Pupils are equal, round, and reactive to light.  Cardiovascular:     Rate and Rhythm: Normal rate. Rhythm  irregular.     Pulses:          Radial pulses are 2+ on the right side and 2+ on the left side.     Heart sounds: No murmur heard. Pulmonary:     Effort: Pulmonary effort is normal. No respiratory distress.     Breath sounds: Normal breath sounds. No decreased breath sounds or wheezing.  Abdominal:     Palpations: Abdomen is soft.     Tenderness: There is no abdominal tenderness.  Musculoskeletal:        General: No swelling. Normal range of motion.     Cervical back: Normal range of motion and neck supple.     Right lower leg: No edema.     Left lower leg: No edema.  Skin:    General: Skin is warm and dry.     Capillary Refill: Capillary refill takes less than 2 seconds.  Neurological:     General: No focal deficit present.     Mental Status: He is alert.  Psychiatric:        Mood and Affect: Mood normal.    ED Results / Procedures / Treatments   Labs (all labs ordered are listed, but only abnormal results are displayed) Labs Reviewed  COMPREHENSIVE METABOLIC PANEL - Abnormal; Notable for the following components:      Result Value   Sodium 134 (*)    All other components within normal limits  CBC  TROPONIN I (HIGH SENSITIVITY)  TROPONIN I (HIGH SENSITIVITY)    EKG EKG Interpretation  Date/Time:  Friday March 30 2021 12:11:03 EST Ventricular Rate:  84 PR Interval:    QRS Duration: 90 QT Interval:  320 QTC Calculation: 378 R Axis:   90 Text Interpretation: Atrial fibrillation Rightward axis Nonspecific T wave abnormality Abnormal ECG When compared with ECG of 02-Mar-2020 13:38, PREVIOUS ECG IS PRESENT Confirmed by Lennice Sites ([id number]) on 03/30/2021 5:47:47 PM  Radiology DG Chest 2 View  Result Date: 03/30/2021 CLINICAL DATA:  Chest pain. EXAM: CHEST - 2 VIEW COMPARISON:  Chest radiograph 03/11/2018 FINDINGS: The cardiomediastinal silhouette is unchanged with normal heart size. Aortic atherosclerosis is noted. The lungs are well inflated. No airspace  consolidation, edema, pleural effusion, or pneumothorax is identified. No acute osseous abnormality is seen. IMPRESSION: No active cardiopulmonary disease. Electronically Signed   By: Logan Bores M.D.   On: 03/30/2021 13:11    Procedures Procedures    Medications Ordered in ED Medications - No data to display  ED Course/ Medical Decision Making/ A&P                           Medical Decision Making  Mario [name] is here with palpitations, chest pain.  Unremarkable vitals.  No fever.  Patient mostly here because he  feels like he is back in A-fib.  Has had an ablation in the past.  He is on Coreg and Eliquis.  He is in rate controlled atrial fibrillation here and mostly the 60s and 70s.  He is fairly asymptomatic at this time.  No chest pain.  EKG per my review and interpretation shows atrial fibrillation in the 80s.  No ischemic changes.  Overall he is mostly concerned about being in atrial fibrillation but gave reassurance as he is on blood thinners.  He is on Coreg.  His heart rate is well within normal range.  He has been checking his vitals at home and heart rate has never really gotten above 100.  He is mostly asymptomatic now.  I have no concern for acute coronary syndrome but will get troponins, basic labs, electrolytes including CBC and BMP to evaluate for secondary causes of atrial fibrillation.  Denies any infectious symptoms.   Lab work has been reviewed and interpreted.  Troponins negative x2 and doubt acute coronary syndrome.  He has no significant anemia, electrolyte abnormality, kidney injury.  Overall patient back in rate controlled A-fib.  He can follow-up outpatient with atrial fibrillation clinic to discuss further treatment options including cardioversion and ablation but no need for those things at this time.  He is asymptomatic.  Understands return precautions.  Discharged in good condition.  This chart was dictated using voice recognition software.  Despite best efforts to  proofread,  errors can occur which can change the documentation meaning.         Final Clinical Impression(s) / ED Diagnoses Final diagnoses:  Atrial fibrillation, unspecified type [facility])    Rx / DC Orders ED Discharge Orders     None         Lennice Sites, DO 03/30/21 1840

## 2021-03-30 NOTE — ED Provider Triage Note (Signed)
Emergency Medicine Provider Triage Evaluation Note  Mario [name] , a 81 y.o. male  was evaluated in triage.  Pt complains of chest pain. States that it came on suddenly this morning, describes it as a 'funny feeling' on the left side of his chest. States that it felt somewhat like when he has been in afib before. Hx of ablation, doesn't think he's had afib since his ablation. States that his pain has now mostly subsided.  Denies any associated leg swelling or shortness of breath.  Denies any cough, congestion, fevers, or chills.  Review of Systems  Positive: Chest pain Negative:   Physical Exam  BP (!) 159/98 (BP Location: Right Arm)    Pulse 85    Temp 98.5 F (36.9 C) (Oral)    Resp 18    Ht 6' (1.829 m)    Wt 91.2 kg    SpO2 96%    BMI 27.26 kg/m  Gen:   Awake, no distress   Resp:  Normal effort  MSK:   Moves extremities without difficulty  Other:    Medical Decision Making  Medically screening exam initiated at 12:56 PM.  Appropriate orders placed.  Damarkus Balis was informed that the remainder of the evaluation will be completed by another provider, this initial triage assessment does not replace that evaluation, and the importance of remaining in the ED until their evaluation is complete.    Bud Face, PA-C 03/30/21 1258

## 2021-03-30 NOTE — Telephone Encounter (Signed)
Patient c/o Palpitations:  High priority if patient c/o lightheadedness, shortness of breath, or chest pain  How long have you had palpitations/irregular HR/ Afib? Since this morning  Are you having the symptoms now? yes  Are you currently experiencing lightheadedness, SOB or CP? Lightheaded, feels like he is going to pass-out.   Do you have a history of afib (atrial fibrillation) or irregular heart rhythm? Has history of A-Fib  Have you checked your BP or HR? (document readings if available): no  Are you experiencing any other symptoms? Feels heart beating fast.

## 2021-03-30 NOTE — Telephone Encounter (Signed)
Pt reports afib since last night he thinks. He knew something wasn't right last night going to bed b/c he didn't feel right, but heart was not racing. He awoke this morning to rapid heartbeats, light headedness, chest tightness, and slight DOE BP 177/90 HR 108

## 2021-04-12 NOTE — Progress Notes (Signed)
I called pt to introduce myself as the Prostate Nurse Navigator and the Coordinator of the Prostate Navarro. ?  ?1. I confirmed with the patient/ and patient's wife, he is aware of his referral to the clinic on 3/7, arriving @ 12:30 pm.  ?  ?2. I discussed the format of the clinic and the physicians he will be seeing that day. ?  ?3. I discussed where the clinic is located and how to contact me. ?  ?4. I confirmed his address and informed him I would be mailing a packet of information and forms to be completed. I asked him to bring them with him the day of his appointment.  ?  ?He voiced understanding of the above. I asked him to call me if he has any questions or concerns regarding his appointments or the forms he needs to complete.  ?

## 2021-04-16 NOTE — Progress Notes (Signed)
Left message for patient for reminder regarding [place] appointment on 3/7 @ 12:30.  ?

## 2021-04-16 NOTE — Progress Notes (Addendum)
? ?                              Care Plan Summary ? ?Name: Mario [name] ?DOB: [birth year]  ? ?Your Medical Team:  ? Urologist -  Dr. Raynelle Bring, [facility] ? Radiation Oncologist - Dr. Tyler Pita, [place]  ? Medical Oncologist - Dr. Zola Button, [place] ? ?Recommendations: ?1) Active Surveillance or    ?2) Radiation Therapy   ? ?* These recommendations are based on information available as of today?s consult.      Recommendations may change depending on the results of further tests or exams. ? ? ? ?Next Steps: ?1) Consider your options and contact Kathlee Nations, RN  ? ?When appointments need to be scheduled, you will be contacted by [facility] and/or [facility]. ? ?Questions?  Please do not hesitate to call Katheren Puller, BSN, RN at [phone] with any questions or concerns.  Kathlee Nations is your Oncology Nurse Navigator and is available to assist you while you?re receiving your medical care at [facility].   ?

## 2021-04-17 ENCOUNTER — Ambulatory Visit: Payer: Self-pay | Admitting: Genetic Counselor

## 2021-04-17 ENCOUNTER — Ambulatory Visit
Admission: RE | Admit: 2021-04-17 | Discharge: 2021-04-17 | Disposition: A | Discharge status: Home / Self Care | Payer: Medicare HMO | Source: Ambulatory Visit | Attending: Radiation Oncology | Admitting: Radiation Oncology

## 2021-04-17 ENCOUNTER — Inpatient Hospital Stay: Payer: Medicare HMO | Attending: Oncology | Admitting: Oncology

## 2021-04-17 ENCOUNTER — Other Ambulatory Visit: Payer: Self-pay

## 2021-04-17 VITALS — BP 129/65 | HR 64 | Temp 97.9°F | Resp 20 | Ht 72.0 in | Wt 202.0 lb

## 2021-04-17 DIAGNOSIS — Z87891 Personal history of nicotine dependence: Secondary | ICD-10-CM | POA: Insufficient documentation

## 2021-04-17 DIAGNOSIS — C61 Malignant neoplasm of prostate: Secondary | ICD-10-CM | POA: Diagnosis not present

## 2021-04-17 DIAGNOSIS — R351 Nocturia: Secondary | ICD-10-CM | POA: Diagnosis not present

## 2021-04-17 NOTE — Progress Notes (Signed)
Mr. Mario [name] was seen by a genetic counselor during the prostate multidisciplinary clinic on 04/17/2021. In addition to his personal history of prostate cancer, he reported a personal history of melanoma in his late 84s and several cancers of basal cell carcinoma.  He reported bone cancer in his sister in his early 50s.  He has two other sisters without a history of cancer.  His mother passed away at age 81, and his father passed away at age 52--both without a cancer history.  He does not meet NCCN criteria for genetic testing for prostate cancer genes at this time. He was still offered genetic counseling and testing but declined. We encourage him to contact us if there are any changes to his personal or family history of cancer. If he meets NCCN criteria based on the updated personal/family history, he would be recommended to have genetic counseling and testing.  ? ? ?

## 2021-04-17 NOTE — Progress Notes (Signed)
Radiation Oncology         [phone] ________________________________  Multidisciplinary Prostate Cancer Clinic  Initial Radiation Oncology Consultation  Name: Mario [name] MRN: [id number]  Date: 04/17/2021  DOB: Jan 07, 1941  WE:[id number], Marvis Repress, FNP  Lucas Mallow, MD   REFERRING PHYSICIAN: Lucas Mallow, MD  DIAGNOSIS: 81 y.o. gentleman with stage T1c adenocarcinoma of the prostate with a Gleason's score of 3+4 and a PSA of 11.1    ICD-10-CM   1. Prostate cancer ([place])  C61       HISTORY OF PRESENT ILLNESS::Mario [name] is a 81 y.o. gentleman. He has has a longstanding history of BPH with  BOO and elevated PSA since at least 2017. He has also been on testosterone injections for hypogonadism for many years. He previously had two negative biopsies in 2017 and 2018 and a benign prostate MRI in 2019 with a PSA of 7.75 at that time. Overall, his PSA has fluctuated over the years but had remained under 8 and digital rectal exam remained without concerning findings.  However, more recently, his PSA increased significantly, up to 11.1 in 12/2020. This prompted a repeat prostate MRI which was performed on 02/13/21 showing a 17 mm PI-RADS 4 lesion in the peripheral zone at the right posteromedial mid gland but no evidence of extracapsular extension or pelvic metastatic disease. The patient proceeded to MRI fusion biopsy of the prostate on 03/19/21 under the care of Dr. Gloriann Loan.  The prostate volume measured 138.57 cc.  Out of 16 core biopsies, 3 were positive.  The maximum Gleason score was 3+4, and this was seen in the left mid and left base lateral (small focus). Additionally, Gleason 3+3 was seen in the left base. Of note, all four cores from the MRI ROI were benign, and all 6 right-sided cores showed HG-PIN.  He has continued on testosterone replacement therapy with bimonthly injections and his most recent testosterone levels were 794 and 835 prior to that.  The patient reviewed the  biopsy results with his urologist and he has kindly been referred today to the multidisciplinary prostate cancer clinic for presentation of pathology and radiology studies in our conference for discussion of potential radiation treatment options and clinical evaluation.  PREVIOUS RADIATION THERAPY: No  PAST MEDICAL HISTORY:  has a past medical history of Aortic insufficiency, Arthritis, History of kidney stones, Hypertension, Melanoma ([place]) (2000), OSA (obstructive sleep apnea), Persistent atrial fibrillation ([place]), Phlebitis, Pneumonia, Seasonal allergies, and Tubular adenoma of colon (2013).    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   ATRIAL FIBRILLATION ABLATION N/A 03/02/2020   Procedure: ATRIAL FIBRILLATION ABLATION;  Surgeon: [name], MD;  Location: [place] [facility];  Service: Cardiovascular;  Laterality: N/A;   BASAL CELL CARCINOMA EXCISION  02/09/2017   CARDIOVERSION N/A 12/01/2014   Procedure: CARDIOVERSION;  Surgeon: Troy Sine, MD;  Location: [place];  Service: Cardiovascular;  Laterality: N/A;   CARDIOVERSION N/A 02/22/2020   Procedure: CARDIOVERSION;  Surgeon: Buford Dresser, MD;  Location: [facility] ENDOSCOPY;  Service: Cardiovascular;  Laterality: N/A;   CATARACT EXTRACTION  2002, 2004   bilateral   CYSTOSCOPY     ELBOW SURGERY Left 1984   tendon repair left arm   INCISION AND DRAINAGE OF WOUND Left 08/28/2016   Procedure: IRRIGATION AND DEBRIDEMENT WOUND;  Surgeon: Iran Planas, MD;  Location: [place];  Service: Orthopedics;  Laterality: Left;  LITHOTRIPSY     melanoma removal  2000   back   METACARPAL OSTEOTOMY Left 08/28/2016   Procedure: 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.;  Surgeon: Iran Planas, MD;  Location: [place];  Service: Orthopedics;  Laterality: Left;   MICROLARYNGOSCOPY WITH LASER N/A 01/25/2015   Procedure: MICROLARYNGOSCOPY ;  Surgeon: Melida Quitter,  MD;  Location: [facility] OR;  Service: ENT;  Laterality: N/A;  Microlaryngoscopy with 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 with CO2 laser    TENDON REPAIR Right 2012   right   TONSILLECTOMY     UPPER GI ENDOSCOPY      FAMILY HISTORY: family history includes Heart disease in his mother, sister, and sister; Hypertension in his sister; Other in his father; Stroke in his father.  SOCIAL HISTORY:  reports that he quit smoking about 37 years ago. His smoking use included cigarettes. He started smoking about 44 years ago. He has never used smokeless tobacco. He reports current alcohol use of about 21.0 standard drinks per week. He reports that he does not use drugs.  He remains very physically active and still participates in weightlifting competitions at the age of 29.  He lives in [place] with his wife, Mario [name].  ALLERGIES: Contrast media [iodinated contrast media] and Lisinopril  MEDICATIONS:  Current Outpatient Medications  Medication Sig Dispense Refill   amLODipine (NORVASC) 10 MG tablet Take 1 tablet (10 mg total) by mouth daily. 90 tablet 0   apixaban (ELIQUIS) 5 MG TABS tablet Take 1 tablet by mouth twice daily 180 tablet 1   carvedilol (COREG) 3.125 MG tablet Take 1 tablet (3.125 mg total) by mouth 2 (two) times daily. 180 tablet 3   Cholecalciferol (DIALYVITE VITAMIN D 5000) 125 MCG (5000 UT) capsule Take 5,000 Units by mouth daily.     fluticasone (FLONASE) 50 MCG/ACT nasal spray Place 1 spray into both nostrils daily.     GLUCOSAMINE-CHONDROITIN PO Take 1 tablet by mouth daily.      hydrALAZINE (APRESOLINE) 25 MG tablet Take 1 tablet (25 mg total) by mouth 2 (two) times daily. 180 tablet 0   irbesartan (AVAPRO) 150 MG tablet Take 1 tablet (150 mg total) by mouth daily. 90 tablet 3   Lutein 20 MG CAPS Take 20 mg by mouth daily.     Multiple Vitamin (MULTIVITAMIN) tablet Take 1 tablet by mouth daily.     OVER THE COUNTER MEDICATION Place 4-5 drops into both eyes as needed (dry eyes).  Thera tears     Saw Palmetto, Serenoa repens, (SAW PALMETTO PO) Take 1 tablet by mouth daily.     testosterone cypionate (DEPOTESTOSTERONE CYPIONATE) 200 MG/ML injection Inject 100 mg into the muscle every Wednesday.     triamcinolone (KENALOG) 0.1 % Apply 1 application topically 2 (two) times daily as needed (red bumps).     No current facility-administered medications for this encounter.    REVIEW OF SYSTEMS:  On review of systems, the patient reports that he is doing well overall. He denies any chest pain, shortness of breath, cough, fevers, chills, night sweats, unintended weight changes. He denies any bowel disturbances, and denies abdominal pain, nausea or vomiting. He denies any new musculoskeletal or joint aches or pains. His IPSS was 11, indicating mild-moderate urinary symptoms. His SHIM was 21, indicating he has mild erectile dysfunction. A complete review of systems is obtained and is otherwise negative.   PHYSICAL EXAM:  Wt Readings from Last 3 Encounters:  04/17/21 202 lb (91.6 kg)  03/30/21  201 lb (91.2 kg)  12/25/20 202 lb 6.4 oz (91.8 kg)   Temp Readings from Last 3 Encounters:  04/17/21 97.9 F (36.6 C)  03/30/21 99 F (37.2 C) (Oral)  06/06/20 (!) 97.5 F (36.4 C) (Temporal)   BP Readings from Last 3 Encounters:  04/17/21 129/65  03/30/21 136/79  10/09/20 (!) 142/60   Pulse Readings from Last 3 Encounters:  04/17/21 64  03/30/21 85  12/25/20 75    /10  In general this is a well appearing Caucasian male in no acute distress.  He's alert and oriented x4 and appropriate throughout the examination. Cardiopulmonary assessment is negative for acute distress and he exhibits normal effort.    KPS = 100  100 - Normal; no complaints; no evidence of disease. 90   - Able to carry on normal activity; minor signs or symptoms of disease. 80   - Normal activity with effort; some signs or symptoms of disease. 37   - Cares for self; unable to carry on normal activity or  to do active work. 60   - Requires occasional assistance, but is able to care for most of his personal needs. 50   - Requires considerable assistance and frequent medical care. 68   - Disabled; requires special care and assistance. 59   - Severely disabled; hospital admission is indicated although death not imminent. 88   - Very sick; hospital admission necessary; active supportive treatment necessary. 10   - Moribund; fatal processes progressing rapidly. 0     - Dead  Karnofsky DA, Abelmann [place], Craver LS and Burchenal JH [phone]) The use of the nitrogen mustards in the palliative treatment of carcinoma: with particular reference to bronchogenic carcinoma Cancer 1 634-56   LABORATORY DATA:  Lab Results  Component Value Date   WBC 6.2 03/30/2021   HGB 16.9 03/30/2021   HCT 48.7 03/30/2021   MCV 92.2 03/30/2021   PLT 200 03/30/2021   Lab Results  Component Value Date   NA 134 (L) 03/30/2021   K 4.5 03/30/2021   CL 103 03/30/2021   CO2 23 03/30/2021   Lab Results  Component Value Date   ALT 22 03/30/2021   AST 25 03/30/2021   ALKPHOS 69 03/30/2021   BILITOT 0.6 03/30/2021     RADIOGRAPHY: DG Chest 2 View  Result Date: 03/30/2021 CLINICAL DATA:  Chest pain. EXAM: CHEST - 2 VIEW COMPARISON:  Chest radiograph 03/11/2018 FINDINGS: The cardiomediastinal silhouette is unchanged with normal heart size. Aortic atherosclerosis is noted. The lungs are well inflated. No airspace consolidation, edema, pleural effusion, or pneumothorax is identified. No acute osseous abnormality is seen. IMPRESSION: No active cardiopulmonary disease. Electronically Signed   By: Logan Bores M.D.   On: 03/30/2021 13:11      IMPRESSION/PLAN: 81 y.o. gentleman with Stage T1c adenocarcinoma of the prostate with a Gleason score of 3+4 and a PSA of 11.1.    We discussed the patient's workup and outlined the nature of prostate cancer in this setting. The patient's T stage, Gleason's score, and PSA put him into the  favorable intermediate risk group. Accordingly, he is eligible for a variety of potential treatment options including active surveillance, prostatectomy, brachytherapy, or 5.5 weeks of external radiation. We discussed the available radiation techniques, and focused on the details and logistics of delivery. The patient is not an ideal candidate for prostatectomy given his advanced age and is not an ideal candidate for brachytherapy with a prostate volume of 138 cc.  Therefore,  we discussed and outlined the risks, benefits, short and long-term effects associated with radiotherapy and compared and contrasted these with prostatectomy. We discussed the role of SpaceOAR gel in reducing the rectal toxicity associated with radiotherapy. He appears to have a good understanding of his disease and our treatment recommendations which are of curative intent.  He and his wife were encouraged to ask questions that were answered to their stated satisfaction.  At the end of the conversation, the patient remains undecided, but appears to be leaning towards proceeding with 5.5 weeks of prostate IMRT.  He will take some additional time to consider his options and has our contact information to let us know once he reaches a decision. We also discussed that if he wishes to proceed with treatment, we recommend that he hold TRT until he has completed his radiation and the prostate cancer has been taken care of but that this decision would ultimately be up to he and his urologist.  We will share our discussion with Dr. Gloriann Loan and await his final decision.  If he ultimately elects to proceed with radiation, we will proceed with treatment planning accordingly, coordinating for fiducial markers and SpaceOAR gel placement, prior to CT simulation.  We enjoyed meeting him and his wife and look forward to continuing to participate in his care.  We personally spent 60 minutes in this encounter including chart review, reviewing radiological  studies, meeting face-to-face with the patient, entering orders and completing documentation.    Nicholos Johns, PA-C    Tyler Pita, MD  [facility] Direct Dial: [phone]   Fax: [phone] [email or web address]   Skype   LinkedIn   This document serves as a record of services personally performed by Tyler Pita, MD and Freeman Caldron, PA-C. It was created on their behalf by Wilburn Mylar, a trained medical scribe. The creation of this record is based on the scribe's personal observations and the provider's statements to them. This document has been checked and approved by the attending provider.

## 2021-04-17 NOTE — Progress Notes (Signed)
Reason for the request:    Prostate cancer  HPI: I was asked by Dr. Gloriann Loan to evaluate Mario [name] to have further evaluation of prostate cancer.  He is a 81 year old man with history of elevated PSA in the past with negative biopsy previously.  His most recent PSA was up to 11 which prompted an MRI on February 13, 2021.  That showed a 17 mm peripheral zone nodule in the mid gland suspicious for high-grade carcinoma BI-RADS 4.  Based on these findings he underwent a fusion biopsy on March 19, 2021 and the final pathology showed a Gleason score 3+3 = 6 and 1 core and 3+4 = 7 in 2 additional cores.  It had 5 and 10% respectively.  Clinically, he reports feeling well without any new major complaints.  He does report nocturia but no issues with frequency urgency or hesitancy.  He denies any incontinence.  He remains active and continues to exercise regularly.   He does not report any headaches, blurry vision, syncope or seizures. Does not report any fevers, chills or sweats.  Does not report any cough, wheezing or hemoptysis.  Does not report any chest pain, palpitation, orthopnea or leg edema.  Does not report any nausea, vomiting or abdominal pain.  Does not report any constipation or diarrhea.  Does not report any skeletal complaints.    Does not report frequency, urgency or hematuria.  Does not report any skin rashes or lesions. Does not report any heat or cold intolerance.  Does not report any lymphadenopathy or petechiae.  Does not report any anxiety or depression.  Remaining review of systems is negative.     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   ATRIAL FIBRILLATION ABLATION N/A 03/02/2020   Procedure: ATRIAL FIBRILLATION ABLATION;  Surgeon: [name], MD;  Location: [place] [facility];  Service: Cardiovascular;  Laterality: N/A;   BASAL CELL CARCINOMA EXCISION  02/09/2017   CARDIOVERSION N/A 12/01/2014   Procedure: CARDIOVERSION;  Surgeon: Troy Sine, MD;  Location: [place];  Service: Cardiovascular;  Laterality: N/A;   CARDIOVERSION N/A 02/22/2020   Procedure: CARDIOVERSION;  Surgeon: Buford Dresser, MD;  Location: [facility] ENDOSCOPY;  Service: Cardiovascular;  Laterality: N/A;   CATARACT EXTRACTION  2002, 2004   bilateral   CYSTOSCOPY     ELBOW SURGERY Left 1984   tendon repair left arm   INCISION AND DRAINAGE OF WOUND Left 08/28/2016   Procedure: IRRIGATION AND DEBRIDEMENT WOUND;  Surgeon: Iran Planas, MD;  Location: [place];  Service: Orthopedics;  Laterality: Left;   LITHOTRIPSY     melanoma removal  2000   back   METACARPAL OSTEOTOMY Left 08/28/2016   Procedure: 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.;  Surgeon: Iran Planas, MD;  Location: [place];  Service: Orthopedics;  Laterality: Left;   MICROLARYNGOSCOPY WITH LASER N/A 01/25/2015  Procedure: MICROLARYNGOSCOPY ;  Surgeon: Melida Quitter, MD;  Location: [place];  Service: ENT;  Laterality: N/A;  Microlaryngoscopy with 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 with CO2 laser    TENDON REPAIR Right 2012   right   TONSILLECTOMY     UPPER GI ENDOSCOPY    :   Current Outpatient Medications:    amLODipine (NORVASC) 10 MG tablet, Take 1 tablet (10 mg total) by mouth daily., Disp: 90 tablet, Rfl: 0   apixaban (ELIQUIS) 5 MG TABS tablet, Take 1 tablet by mouth twice daily, Disp: 180 tablet, Rfl: 1   carvedilol (COREG) 3.125 MG tablet, Take 1 tablet (3.125 mg total) by mouth 2 (two) times daily., Disp: 180  tablet, Rfl: 3   Cholecalciferol (DIALYVITE VITAMIN D 5000) 125 MCG (5000 UT) capsule, Take 5,000 Units by mouth daily., Disp: , Rfl:    fluticasone (FLONASE) 50 MCG/ACT nasal spray, Place 1 spray into both nostrils daily., Disp: , Rfl:    GLUCOSAMINE-CHONDROITIN PO, Take 1 tablet by mouth daily. , Disp: , Rfl:    hydrALAZINE (APRESOLINE) 25 MG tablet, Take 1 tablet (25 mg total) by mouth 2 (two) times daily., Disp: 180 tablet, Rfl: 0   irbesartan (AVAPRO) 150 MG tablet, Take 1 tablet (150 mg total) by mouth daily., Disp: 90 tablet, Rfl: 3   Lutein 20 MG CAPS, Take 20 mg by mouth daily., Disp: , Rfl:    Multiple Vitamin (MULTIVITAMIN) tablet, Take 1 tablet by mouth daily., Disp: , Rfl:    OVER THE COUNTER MEDICATION, Place 4-5 drops into both eyes as needed (dry eyes). Thera tears, Disp: , Rfl:    Saw Palmetto, Serenoa repens, (SAW PALMETTO PO), Take 1 tablet by mouth daily., Disp: , Rfl:    testosterone cypionate (DEPOTESTOSTERONE CYPIONATE) 200 MG/ML injection, Inject 100 mg into the muscle every Wednesday., Disp: , Rfl:    triamcinolone (KENALOG) 0.1 %, Apply 1 application topically 2 (two) times daily as needed (red bumps)., Disp: , Rfl: :   Allergies  Allergen Reactions   Contrast Media [Iodinated Contrast Media] Shortness Of Breath, Rash and Other (See Comments)    First noted after Coronary CTA 02/24/20. Pt experienced wheezing, shortness of breath and diffuse rash/welts over face and trunk.   Lisinopril Other (See Comments)    REACTION: severe GI upset requiring EGD--temporally related to lisinopril--tolerated micardis  :   Family History  Problem Relation Age of Onset   Heart disease Mother    Stroke Father    Other Father        brain tumor-benign   Heart disease Sister    Heart disease Sister    Hypertension Sister    Colon cancer Neg Hx    Stomach cancer Neg Hx    Esophageal cancer Neg Hx    Pancreatic cancer Neg Hx    Liver disease Neg Hx   :   Social History    Socioeconomic History   Marital status: Married    Spouse name: Not on file   Number of children: 2   Years of education: Not on file   Highest education level: Not on file  Occupational History   Occupation: retired  Tobacco Use   Smoking status: Former    Types: Cigarettes    Start date: 11/20/1976    Quit date: 1986    Years since quitting: 37.2   Smokeless tobacco: Never   Tobacco comments:    quit smoking 1986 ish  Vaping Use  Vaping Use: Never used  Substance and Sexual Activity   Alcohol use: Yes    Alcohol/week: 21.0 standard drinks    Types: 21 Standard drinks or equivalent per week    Comment: 2-3 beers per night   Drug use: No   Sexual activity: Not on file  Other Topics Concern   Not on file  Social History Narrative   Lives in [place].  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  Exam: ECOG 0  General appearance: alert and cooperative appeared without distress. Head: atraumatic without any abnormalities. Eyes: conjunctivae/corneas clear. PERRL.  Sclera anicteric. Throat: lips, mucosa, and tongue normal; without oral thrush or ulcers. Resp: clear to auscultation bilaterally without rhonchi, wheezes or dullness to percussion. Cardio: regular rate and rhythm, S1, S2 normal, no murmur, click, rub or gallop GI: soft, non-tender; bowel sounds normal; no masses,  no organomegaly Skin: Skin color, texture, turgor normal. No rashes or lesions Lymph nodes: Cervical, supraclavicular, and axillary nodes normal. Neurologic: Grossly normal without any motor, sensory or deep tendon reflexes. Musculoskeletal: No joint deformity or  effusion.    DG Chest 2 View  Result Date: 03/30/2021 CLINICAL DATA:  Chest pain. EXAM: CHEST - 2 VIEW COMPARISON:  Chest radiograph 03/11/2018 FINDINGS: The cardiomediastinal silhouette is unchanged with normal heart size. Aortic atherosclerosis is noted. The lungs are well inflated. No airspace consolidation, edema, pleural effusion, or pneumothorax is identified. No acute osseous abnormality is seen. IMPRESSION: No active cardiopulmonary disease. Electronically Signed   By: Logan Bores M.D.   On: 03/30/2021 13:11    Assessment and Plan:    81 year old man with prostate cancer diagnosed with prostate cancer in February 2023.  He is Gleason score of 7 and a PSA of 11.1.  His volume range between 5 to 10%.  The natural course of this disease was reviewed and treatment choices were discussed at this time.  Primary surgical therapy is probably right recommended given the patient's age.  His options include definitive radiation therapy alone versus active surveillance.  Those options were debated and risks and benefits were reviewed.  The role for systemic therapy such as androgen receptor pathway inhibitors, androgen deprivation therapy among others will be deferred unless he has more advanced disease.  He is leaning towards proceeding with radiation therapy and opts against active surveillance.  30  minutes were dedicated to this visit. The time was spent on reviewing laboratory data, imaging studies, discussing treatment options, discussing pathology results and answering questions regarding future plan.     A copy of this consult has been forwarded to the requesting physician.

## 2021-04-18 NOTE — Consult Note (Signed)
[facility]     04/17/2021   --------------------------------------------------------------------------------   Mario [name]  MRN: [id number]  DOB: 16-Apr-1940, 81 year old Male  SSN: [id number]   PRIMARY CARE:  Dorcas Mcmurray  REFERRING:  Wonda Cheng. Williams Che,   PROVIDER:  Wendie Simmer, M.D.  TREATING:  Raynelle Bring, M.D.  LOCATION:  [facility] [phone]     --------------------------------------------------------------------------------   CC/HPI: CC: Prostate Cancer   Physician requesting consult: Dr. Alen Blew  PCP: Dr. Dorcas Mcmurray  Location of consult: [facility]   Mario [name] is an 81 year old gentleman who has a history of atrial fibrillation, hypertension, and glaucoma but is quite active and still participates in weightlifting competitions at age 3. He has a history of an elevated PSA and has undergone two prior negative prostate biopsies in the past. He has been on testosterone replacement therapy and was recently noted to have a significant increase of his PSA up to 11.1. This prompted an MRI of the prostate on 02/13/21 that indicated a 1.7 cm PI-RADS 4 lesion of the right posterolateral mid gland. An MR/US fusion biopsy was performed on 03/19/21 that confirmed Gleason 3+4=7 adenocarcinoma of the prostate with 3 out of 16 cores positive for malignancy. All targeted biopsies were negative and all positive cores were on the left side.   Family history: None.   Imaging studies: MRI (02/13/21): Negative for SVI, LAD, EPE, or bone lesions.   PMH: He has a history of atrial fibrillation (on Eliquis), hypertension, and glaucoma.  PSH: No abdominal surgeries.   TNM stage: cT1c N0 Mx  PSA: 11.1  Gleason score: 3+4=7 (GG 2)  Biopsy (03/19/21): 3/16 cores positive  Left: L mid (10%, 3+4=7) L lateral base (5%, 3+4=7), L base (30%, 3+3=6)  Right: Benign  MR targets: Benign  Prostate volume: 138.6 cc   PSAD: 0.08   Nomogram  OC disease: 50%  EPE: 49%  SVI: 4%  LNI: 4%  PFS (5 year, 10 year): 79%, 66%   Urinary function: IPSS is 11.  Erectile function: SHIM score is 21.     ALLERGIES: No Allergies    MEDICATIONS: Amlodipine Besylate 5 mg tablet Oral  Atenolol 25 mg tablet Oral  Carvedilol 3.125 mg tablet  Cozaar 100 mg tablet Oral  Eliquis 5 mg tablet Oral  Exel Syringe 21 gauge x 1 1/2" syringe, empty disposable 0 Does Not Apply  Flecainide Acetate  Hydralazine Hcl 25 mg tablet  Irbesartan 150 mg tablet  Multivitamins tablet Oral  Sildenafil Citrate 20 mg tablet 1 tablet PO prn Pt. to take 1-5tabs po 1hr prn prior to sexual activity  Sildenafil Citrate 20 mg tablet 1-5 tablets as needed  Sildenafil Citrate 20 mg tablet 1 tablet PO prn Pt. to take 1-5tabs po 1hr prn prior to sexual activity  Sildenafil Citrate 20 mg tablet 1 tablet PO prn Pt. to take 1-5tabs po 1hr prn prior to sexual activity  Sildenafil Citrate 20 mg tablet 1 tablet PO PRN Pt to take 1-5tabs PRN 1hr prior to sexual activity  Sildenafil Citrate 20 mg tablet take 1-5 tablets by mouth as needed  Testosterone Cypionate 200 mg/ml vial 0.5 ml IM q 7 days     Notes: Sildenafil '20mg'$  dispensed 05/29/2018   GU PSH: Prostate Needle Biopsy - 03/19/2021       PSH Notes: Cataract Surgery, Elbow Surgery, Back Surgery   Cardio procedure   NON-GU PSH:  Back Surgery (Unspecified) Cataract Surgery.., Bilateral Heart Surgery (Unspecified), CardioV Surgical Pathology, Gross And Microscopic Examination For Prostate Needle - 03/19/2021     GU PMH: BPH w/LUTS - 03/22/2021, - 12/29/2020, Benign prostatic hyperplasia with urinary obstruction, - 2017 Nocturia - 03/22/2021, (Stable), - 12/29/2020, Nocturia, - 2017 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, - 12/29/2020, - 06/30/2020, - 11/26/2019, - 2019, - 2019, - 2018, - 2018, - 2017, Hypogonadism, testicular, - 2017 Prostate Cancer - 03/22/2021 Weak Urinary Stream - 03/22/2021, -  12/29/2020 Elevated PSA - 03/19/2021, - 12/29/2020, - 06/30/2020, - 2019, - 2018, - 2018, - 2017, Elevated prostate specific antigen (PSA), - 2017 ED due to arterial insufficiency - 12/29/2020, - 06/30/2020, - 2019 BPH w/o LUTS - 2019 Acute prostatitis, Prostatitis, acute - 2014      PMH Notes:  2009-12-04 14:21:43 - Note: Arthritis   NON-GU PMH: Encounter for general adult medical examination without abnormal findings, Encounter for preventive health examination - 2016 Personal history of other diseases of the circulatory system, History of hypertension - 2014 Personal history of other diseases of the nervous system and sense organs, History of glaucoma - 2014 Atrial Fibrillation    FAMILY HISTORY: Brain Cancer - Father Congestive Heart Failure - Mother Family Health Status Number - Runs In Family   SOCIAL HISTORY: Marital Status: Married Preferred Language: English; Ethnicity: Not Hispanic Or Latino; Race: White Current Smoking Status: Patient does not smoke anymore. Has not smoked since 09/11/1980.  Social Drinker.  Does not drink caffeine. Patient's occupation is/was retired.     Notes: Former smoker, Tobacco Use, Being A Economist, Marital History - Currently Married   REVIEW OF SYSTEMS:    GU Review Male:   Patient denies frequent urination, hard to postpone urination, burning/ pain with urination, get up at night to urinate, leakage of urine, stream starts and stops, trouble starting your streams, and have to strain to urinate .  Gastrointestinal (Lower):   Patient denies diarrhea and constipation.  Gastrointestinal (Upper):   Patient denies nausea and vomiting.  Constitutional:   Patient denies weight loss, fever, fatigue, and night sweats.  Skin:   Patient denies skin rash/ lesion and itching.  Eyes:   Patient denies blurred vision and double vision.  Ears/ Nose/ Throat:   Patient denies sore throat and sinus problems.  Hematologic/Lymphatic:   Patient denies swollen  glands and easy bruising.  Cardiovascular:   Patient denies leg swelling and chest pains.  Respiratory:   Patient denies cough and shortness of breath.  Endocrine:   Patient denies excessive thirst.  Musculoskeletal:   Patient denies back pain and joint pain.  Neurological:   Patient denies headaches and dizziness.  Psychologic:   Patient denies depression and anxiety.   VITAL SIGNS: None   MULTI-SYSTEM PHYSICAL EXAMINATION:    Constitutional: Well-nourished. No physical deformities. Normally developed. Good grooming.     Complexity of Data:  Lab Test Review:   PSA  Records Review:   Pathology Reports, Previous Patient Records   12/22/20 06/30/20 11/13/17 03/28/17 03/17/17 09/18/16 03/20/16 09/21/15  PSA  Total PSA 11.10 ng/mL 8.65 ng/mL 5.37 ng/mL 7.54 ng/mL 7.75 ng/mL 6.50 ng/mL 5.19 ng/dl 4.62   Free PSA     1.75 ng/mL 2.12 ng/mL 1.44 ng/dl 1.26   % Free PSA     23 % PSA 33 % PSA 28 % 27     12/22/20 05/23/20 11/19/19 11/18/18 11/13/17 03/17/17 09/18/16 03/20/16  Hormones  Testosterone, Total 794.3 ng/dL 835.0 ng/dL 679.5 ng/dL 382.8  ng/dL 963.8 ng/dL 606.3 ng/dL 822.6 ng/dL 757.6 pg/dL    PROCEDURES: None   ASSESSMENT:      ICD-10 Details  1 GU:   Prostate Cancer - C61    PLAN:           Document Letter(s):  Created for Patient: Clinical Summary         Notes:   1. Prostate cancer: I had a detailed discussion with [name] and his wife today regarding his prostate cancer situation. We discussed his age and life expectancy as well as his disease parameters and how this factors into our recommendations. The patient was counseled about the natural history of prostate cancer and the standard treatment options that are available for prostate cancer. It was explained to him how his age and life expectancy, clinical stage, Gleason score/prognostic grade group, and PSA (and PSA density) affect his prognosis, the decision to proceed with additional staging studies, as well as  how that information influences recommended treatment strategies. We discussed the roles for active surveillance, radiation therapy, surgical therapy, androgen deprivation, as well as ablative therapy and other investigational options for the treatment of prostate cancer as appropriate to his individual cancer situation. We discussed the risks and benefits of these options with regard to their impact on cancer control and also in terms of potential adverse events, complications, and impact on quality of life particularly related to urinary and sexual function. The patient was encouraged to ask questions throughout the discussion today and all questions were answered to his stated satisfaction. In addition, the patient was provided with and/or directed to appropriate resources and literature for further education about prostate cancer and treatment options.   Ultimately, I recommended that he strongly consider active surveillance as an option. He is not sure that he is comfortable with this approach but understands that surgery would carry significant risks to his quality of life at age 67. He does have some interest in radiation therapy and is scheduled to meet with both Dr. Tammi Klippel and Dr. Alen Blew later this afternoon. He will notify Dr. Gloriann Loan of his final decision.   2. Testosterone deficiency: We did discuss his testosterone replacement therapy in the setting of his prostate cancer diagnosis. Considering that this does significantly improve his quality of life and considering recent data regarding safety in the setting of prostate cancer, I told him that I do not think he would need to discontinue his testosterone replacement therapy in the setting of active surveillance or definitive treatment as long as his levels remain in the eugonadal range.   CC: Dr. Alen Blew  Dr. Tyler Pita  Dr. Zola Button  Dr. Jodi Mourning    E & M CODES: We spent 43 minutes dedicated to evaluation and management time,  including face to face interaction, discussions on coordination of care, documentation, result review, and discussion with others as applicable.

## 2021-04-19 NOTE — Progress Notes (Deleted)
? ?PCP:  Marrian Salvage, [name] ?Primary Cardiologist: Shelva Majestic, MD ?Electrophysiologist: [name], MD  ? ?Mario [name] is a 81 y.o. male seen today for [name], MD for routine electrophysiology followup.  Since last being seen in our clinic the patient reports doing ***.  he denies chest pain, palpitations, dyspnea, PND, orthopnea, nausea, vomiting, dizziness, syncope, edema, weight gain, or early satiety. ? ?Past Medical History:  ?Diagnosis Date  ? Aortic insufficiency   ? Arthritis   ? neck  ? History of kidney stones   ? Hypertension   ? Melanoma (Mario [name]) 2000  ? back; stage 3, squamous 1 - basal- several  ? OSA (obstructive sleep apnea)   ? uses CPAP  ? Persistent atrial fibrillation (Mario [name])   ? afib - cardioverted 01/2015  ? Phlebitis   ? Pneumonia   ? Seasonal allergies   ? Tubular adenoma of colon 2013  ? colonoscpy in 5 years Mario [name]  ? ?Past Surgical History:  ?Procedure Laterality Date  ? AMPUTATION FINGER  left  ? ring finger first nuckle  ? ATRIAL FIBRILLATION ABLATION N/A 03/02/2020  ? Procedure: ATRIAL FIBRILLATION ABLATION;  Surgeon: [name], MD;  Location: [facility];  Service: Cardiovascular;  Laterality: N/A;  ? BASAL CELL CARCINOMA EXCISION  02/09/2017  ? CARDIOVERSION N/A 12/01/2014  ? Procedure: CARDIOVERSION;  Surgeon: Mario Sine, MD;  Location: [facility];  Service: Cardiovascular;  Laterality: N/A;  ? CARDIOVERSION N/A 02/22/2020  ? Procedure: CARDIOVERSION;  Surgeon: Mario Dresser, MD;  Location: [facility];  Service: Cardiovascular;  Laterality: N/A;  ? CATARACT EXTRACTION  2002, 2004  ? bilateral  ? CYSTOSCOPY    ? ELBOW SURGERY Left 1984  ? tendon repair left arm  ? INCISION AND DRAINAGE OF WOUND Left 08/28/2016  ? Procedure: IRRIGATION AND DEBRIDEMENT WOUND;  Surgeon: Mario Planas, MD;  Location: [place];  Service: Orthopedics;  Laterality: Left;  ? LITHOTRIPSY    ? melanoma removal  2000  ? back  ? METACARPAL OSTEOTOMY Left 08/28/2016  ?  Procedure: 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.;  Surgeon: Mario Planas, MD;  Location: [facility];  Service: Orthopedics;  Laterality: Left;  ? MICROLARYNGOSCOPY WITH LASER N/A 01/25/2015  ? Procedure: MICROLARYNGOSCOPY ;  Surgeon: Mario Quitter, MD;  Location: [facility];  Service: ENT;  Laterality: N/A;  Microlaryngoscopy with 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 with CO2 laser   ? TENDON REPAIR Right 2012  ? right  ? TONSILLECTOMY    ? UPPER GI ENDOSCOPY    ? ? ?Current Outpatient Medications  ?Medication Sig Dispense Refill  ? amLODipine (NORVASC) 10 MG tablet Take 1 tablet (10 mg total) by mouth daily. 90 tablet 0  ? apixaban (ELIQUIS) 5 MG TABS tablet Take 1 tablet by mouth twice daily 180 tablet 1  ? carvedilol (COREG) 3.125 MG tablet Take 1 tablet (3.125 mg total) by mouth 2 (two) times daily. 180 tablet 3  ? Cholecalciferol (DIALYVITE VITAMIN D 5000) 125 MCG (5000 UT) capsule Take 5,000 Units by mouth daily.    ? fluticasone (FLONASE) 50 MCG/ACT nasal spray Place 1 spray into both nostrils daily.    ? GLUCOSAMINE-CHONDROITIN PO Take 1 tablet by mouth daily.     ? hydrALAZINE (APRESOLINE) 25 MG tablet Take 1 tablet (25 mg total) by mouth 2 (two) times daily. 180 tablet 0  ? irbesartan (AVAPRO) 150 MG tablet Take 1 tablet (150 mg total) by mouth daily. 90 tablet 3  ?  Lutein 20 MG CAPS Take 20 mg by mouth daily.    ? Multiple Vitamin (MULTIVITAMIN) tablet Take 1 tablet by mouth daily.    ? OVER THE COUNTER MEDICATION Place 4-5 drops into both eyes as needed (dry eyes). Thera tears    ? Saw Palmetto, Serenoa repens, (SAW PALMETTO PO) Take 1 tablet by mouth daily.    ? testosterone cypionate (DEPOTESTOSTERONE CYPIONATE) 200 MG/ML injection Inject 100 mg into the muscle every Wednesday.    ? triamcinolone (KENALOG) 0.1 % Apply 1 application topically 2 (two) times daily as needed (red bumps).    ? ?No current facility-administered medications for this visit.  ? ? ?Allergies  ?Allergen Reactions  ? Contrast  Media [Iodinated Contrast Media] Shortness Of Breath, Rash and Other (See Comments)  ?  First noted after Coronary CTA 02/24/20. Pt experienced wheezing, shortness of breath and diffuse rash/welts over face and trunk.  ? Lisinopril Other (See Comments)  ?  REACTION: severe GI upset requiring EGD--temporally related to lisinopril--tolerated micardis  ? ? ?Social History  ? ?Socioeconomic History  ? Marital status: Married  ?  Spouse name: Not on file  ? Number of children: 2  ? Years of education: Not on file  ? Highest education level: Not on file  ?Occupational History  ? Occupation: retired  ?Tobacco Use  ? Smoking status: Former  ?  Types: Cigarettes  ?  Start date: 11/20/1976  ?  Quit date: 83  ?  Years since quitting: 37.2  ? Smokeless tobacco: Never  ? Tobacco comments:  ?  quit smoking 1986 ish  ?Vaping Use  ? Vaping Use: Never used  ?Substance and Sexual Activity  ? Alcohol use: Yes  ?  Alcohol/week: 21.0 standard drinks  ?  Types: 21 Standard drinks or equivalent per week  ?  Comment: 2-3 beers per night  ? Drug use: No  ? Sexual activity: Not on file  ?Other Topics Concern  ? Not on file  ?Social History Narrative  ? Lives in [place].  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 ?Social Determinants of Health  ? ?Financial Resource Strain: Not on file  ?Food Insecurity: Not on file  ?Transportation Needs: Not on file  ?Physical Activity: Not on file  ?Stress: Not on file  ?Social Connections: Not on file  ?Intimate Partner Violence: Not on file  ? ? ? ?Review of Systems: ?All other systems reviewed and are otherwise negative except as noted above. ? ?Physical Exam: ?There were no vitals filed for this visit. ? ?GEN- The patient is well appearing, alert and oriented x 3 today.   ?HEENT: normocephalic, atraumatic; sclera clear, conjunctiva pink; hearing intact; oropharynx clear; neck supple, no JVP ?Lymph- no cervical lymphadenopathy ?Lungs- Clear to ausculation bilaterally, normal  work of breathing.  No wheezes, rales, rhonchi ?Heart- Regular rate and rhythm, no murmurs, rubs or gallops, PMI not laterally displaced ?GI- soft, non-tender, non-distended, bowel sounds present, no hepatosplenomegaly ?Extremities- no clubbing, cyanosis, or edema; DP/PT/radial pulses 2+ bilaterally ?MS- no significant deformity or atrophy ?Skin- warm and dry, no rash or lesion ?Psych- euthymic mood, full affect ?Neuro- strength and sensation are intact ? ?EKG is ordered. Personal review of EKG from today shows *** ? ?Additional studies reviewed include: ?Previous EP and recent ED notes.  ? ?Assessment and Plan: ? ?1. Persistent atrial fibrillation/ flutter ?Has been managed well s/p AF and CTI ablation off flecainide ?EKG today shows *** ?Continue eliquis  for CHA2DS2/VASc of at least 3. ? ?2. HTN ?Stable on current regimen  ? ?3. AI ?Mild/Mod by prior echo.  ? ?Follow up with {Blank single:19197::"Mario. Allred","Mario. Arlan Organ. Klein","Mario. Camnitz","Mario. Lambert","EP APP"} in {Blank single:19197::"2 weeks","4 weeks","3 months","6 months","12 months","as usual post gen change"}  ? ?Shirley Friar, PA-C  ?04/19/21 ?9:13 AM  ?

## 2021-04-22 DIAGNOSIS — C61 Malignant neoplasm of prostate: Secondary | ICD-10-CM | POA: Diagnosis not present

## 2021-04-22 DIAGNOSIS — Z191 Hormone sensitive malignancy status: Secondary | ICD-10-CM | POA: Diagnosis not present

## 2021-04-24 ENCOUNTER — Other Ambulatory Visit: Payer: Self-pay | Admitting: Urology

## 2021-04-24 ENCOUNTER — Telehealth: Payer: Self-pay | Admitting: Cardiovascular Disease

## 2021-04-24 NOTE — Telephone Encounter (Signed)
? ?  Name: [name]  ?DOB: [birth year]  ?MRN: [id number] ? ?Primary Cardiologist: Shelva Majestic, MD ? ?Chart reviewed as part of pre-operative protocol coverage. Because of Mario [name]'s past medical history and time since last visit, he will require a follow-up visit in order to better assess preoperative cardiovascular risk. ? ?Pre-op covering staff: ?- Please schedule appointment and call patient to inform them. If patient already had an upcoming appointment within acceptable timeframe, please add "pre-op clearance" to the appointment notes so provider is aware. ?- Please contact requesting surgeon's office via preferred method (i.e, phone, fax) to inform them of need for appointment prior to surgery. ? ?If applicable, this message will also be routed to pharmacy pool and/or primary cardiologist for input on holding anticoagulant/antiplatelet agent as requested below so that this information is available to the clearing provider at time of patient's appointment.  ? ?Patient is due for follow-up in May.  Since the surgery is on 06/27/2021, we can try to arrange a follow-up to early May so we can clear the patient in the office. ? ?Mario [name], [place]  ?04/24/2021, 7:06 PM  ? ?

## 2021-04-24 NOTE — Telephone Encounter (Signed)
? ? ?  Pre-operative Risk Assessment  ?  ?Patient Name: Mario [name]  ?DOB: Nov 09, 1940 ?MRN: [id number]  ? ?  ? ?Request for Surgical Clearance   ? ?Procedure:   Gold seed and space oar placement ? ?Date of Surgery:  Clearance 06/27/21                              ?   ?Surgeon:  Dr. Leotis Pain ?Surgeon's Group or Practice Name:  [facility] ?Phone number:  [phone] ext [phone] ?Fax number:  [phone] ?  ?Type of Clearance Requested:   ?- Medical  ?- Pharmacy:  Hold Apixaban (Eliquis) 48 hours prior procedure ?  ?Type of Anesthesia:  MAC ?  ?Additional requests/questions:   ? ?Signed, ?[place]   ?04/24/2021, 2:51 PM   ?

## 2021-04-24 NOTE — Progress Notes (Signed)
Patient presented to [facility] on 3/7 for Stage T1c adenocarcinoma of the prostate with a Gleason score of 3+4 and a PSA of 11.1.  ? ?Patient has decided to proceed with 5.5 weeks of external beam radiation.   ? ?RN spoke with patient, patient aware that we are waiting surgical dates for fiducial's, spaceOAR, and will the coordinate for CT Simulation.  No further needs at this time.  ?

## 2021-04-24 NOTE — Telephone Encounter (Signed)
Clinical pharmacist to review Eliquis 

## 2021-04-25 NOTE — Telephone Encounter (Signed)
Pt has been scheduled to see Coletta Memos, PA-C, 06/04/21, clearance will be addressed at that time. ? ?Will route to the requesting surgeon's office to make them aware.  ?

## 2021-04-25 NOTE — Telephone Encounter (Signed)
Patient with diagnosis of afib on Eliquis for anticoagulation.   ? ?Procedure: gold seed and space oar placement ?Date of procedure: 06/27/21 ? ?CHA2DS2-VASc Score = 4  ?This indicates a 4.8% annual risk of stroke. ?The patient's score is based upon: ?CHF History: 0 ?HTN History: 1 ?Diabetes History: 0 ?Stroke History: 0 ?Vascular Disease History: 1 ?Age Score: 2 ?Gender Score: 0 ? ?CrCl 21m/min ?Platelet count 200K ? ?Per office protocol, patient can hold Eliquis for 2 days prior to procedure.   ?

## 2021-04-27 ENCOUNTER — Ambulatory Visit: Payer: Medicare HMO | Admitting: Student

## 2021-04-27 DIAGNOSIS — I4819 Other persistent atrial fibrillation: Secondary | ICD-10-CM

## 2021-04-27 DIAGNOSIS — I10 Essential (primary) hypertension: Secondary | ICD-10-CM

## 2021-05-11 ENCOUNTER — Encounter: Payer: Medicare HMO | Admitting: Family

## 2021-05-15 ENCOUNTER — Ambulatory Visit (INDEPENDENT_AMBULATORY_CARE_PROVIDER_SITE_OTHER): Payer: Medicare HMO | Admitting: Family

## 2021-05-15 ENCOUNTER — Encounter: Payer: Self-pay | Admitting: Family

## 2021-05-15 VITALS — BP 122/70 | HR 70 | Temp 97.8°F | Ht 71.0 in | Wt 200.2 lb

## 2021-05-15 DIAGNOSIS — Z0000 Encounter for general adult medical examination without abnormal findings: Secondary | ICD-10-CM

## 2021-05-15 DIAGNOSIS — G4733 Obstructive sleep apnea (adult) (pediatric): Secondary | ICD-10-CM | POA: Diagnosis not present

## 2021-05-15 NOTE — Progress Notes (Signed)
?Mario [name] is a 81 y.o. male with the following history as recorded in EpicCare:  ?Patient Active Problem List  ? Diagnosis Date Noted  ? Prostate cancer ([place]) 04/17/2021  ? Elevated coronary artery calcium score 02/28/2021  ? Dizziness 03/22/2020  ? Secondary hypercoagulable state ([place]) 02/15/2020  ? Atypical chest pain   ? Near syncope   ? Swelling of both ankles 07/02/2015  ? Laryngeal nodule 01/17/2015  ? OSA on CPAP 01/12/2015  ? Anticoagulation adequate 11/03/2014  ? Atrial fibrillation ([place]) 09/29/2014  ? History of colonic polyps 09/20/2014  ? 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 Stricture and stenosis of esophagus 06/20/2008  ? 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 HYPERTENSION, BENIGN 05/06/2006  ? ELEVATED PROSTATE SPECIFIC ANTIGEN 05/06/2006  ? Melanoma ([place]) 04/10/2006  ? CATARACT 04/10/2006  ? Diverticulitis of colon 04/10/2006  ? NEPHROLITHIASIS 04/10/2006  ?  ?Current Outpatient Medications  ?Medication Sig Dispense Refill  ? amLODipine (NORVASC) 10 MG tablet Take 1 tablet (10 mg total) by mouth daily. 90 tablet 0  ? apixaban (ELIQUIS) 5 MG TABS tablet Take 1 tablet by mouth twice daily 180 tablet 1  ? carvedilol (COREG) 3.125 MG tablet Take 1 tablet (3.125 mg total) by mouth 2 (two) times daily. 180 tablet 3  ? Cholecalciferol (DIALYVITE VITAMIN D 5000) 125 MCG (5000 UT) capsule Take 5,000 Units by mouth daily.    ? fluticasone (FLONASE) 50 MCG/ACT nasal spray Place 1 spray into both nostrils daily.    ? GLUCOSAMINE-CHONDROITIN PO Take 1 tablet by mouth daily.     ? hydrALAZINE (APRESOLINE) 25 MG tablet Take 1 tablet (25 mg total) by mouth 2 (two) times daily. 180 tablet 0  ? irbesartan (AVAPRO) 150 MG tablet Take 1 tablet (150 mg total) by mouth daily. 90 tablet 3  ? Lutein 20 MG CAPS Take 20 mg by mouth daily.    ? Multiple Vitamin (MULTIVITAMIN) tablet Take 1 tablet by mouth daily.    ? OVER THE COUNTER MEDICATION Place 4-5 drops into both  eyes as needed (dry eyes). Thera tears    ? Saw Palmetto, Serenoa repens, (SAW PALMETTO PO) Take 1 tablet by mouth daily.    ? testosterone cypionate (DEPOTESTOSTERONE CYPIONATE) 200 MG/ML injection Inject 100 mg into the muscle every Wednesday.    ? triamcinolone (KENALOG) 0.1 % Apply 1 application topically 2 (two) times daily as needed (red bumps).    ? ?No current facility-administered medications for this visit.  ?  ?Allergies: Contrast media [iodinated contrast media] and Lisinopril  ?Past Medical History:  ?Diagnosis Date  ? Aortic insufficiency   ? Arthritis   ? neck  ? History of kidney stones   ? Hypertension   ? Melanoma ([place]) 2000  ? back; stage 3, squamous 1 - basal- several  ? OSA (obstructive sleep apnea)   ? uses CPAP  ? Persistent atrial fibrillation ([place])   ? afib - cardioverted 01/2015  ? Phlebitis   ? Pneumonia   ? Seasonal allergies   ? Tubular adenoma of colon 2013  ? colonoscpy in 5 years [name]  ?  ?Past Surgical History:  ?Procedure Laterality Date  ? AMPUTATION FINGER  left  ? ring finger first nuckle  ? ATRIAL FIBRILLATION ABLATION N/A 03/02/2020  ? Procedure: ATRIAL FIBRILLATION ABLATION;  Surgeon: [name], MD;  Location: [place] [facility];  Service: Cardiovascular;  Laterality: N/A;  ? BASAL CELL CARCINOMA EXCISION  02/09/2017  ?  CARDIOVERSION N/A 12/01/2014  ? Procedure: CARDIOVERSION;  Surgeon: Troy Sine, MD;  Location: [place];  Service: Cardiovascular;  Laterality: N/A;  ? CARDIOVERSION N/A 02/22/2020  ? Procedure: CARDIOVERSION;  Surgeon: Buford Dresser, MD;  Location: [facility]OS[facility];  Service: Cardiovascular;  Laterality: N/A;  ? CATARACT EXTRACTION  2002, 2004  ? bilateral  ? CYSTOSCOPY    ? ELBOW SURGERY Left 1984  ? tendon repair left arm  ? INCISION AND DRAINAGE OF WOUND Left 08/28/2016  ? Procedure: IRRIGATION AND DEBRIDEMENT WOUND;  Surgeon: Iran Planas, MD;  Location: [place];  Service: Orthopedics;  Laterality: Left;  ? LITHOTRIPSY    ? melanoma  removal  2000  ? back  ? METACARPAL OSTEOTOMY Left 08/28/2016  ? Procedure: 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.;  Surgeon: Iran Planas, MD;  Location: [place];  Service: Orthopedics;  Laterality: Left;  ? MICROLARYNGOSCOPY WITH LASER N/A 01/25/2015  ? Procedure: MICROLARYNGOSCOPY ;  Surgeon: Melida Quitter, MD;  Location: [place];  Service: ENT;  Laterality: N/A;  Microlaryngoscopy with 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 with CO2 laser   ? TENDON REPAIR Right 2012  ? right  ? TONSILLECTOMY    ? UPPER GI ENDOSCOPY    ?  ?Family History  ?Problem Relation Age of Onset  ? Heart disease Mother   ? Stroke Father   ? Other Father   ?     brain tumor-benign  ? Heart disease Sister   ? Heart disease Sister   ? Hypertension Sister   ? Colon cancer Neg Hx   ? Stomach cancer Neg Hx   ? Esophageal cancer Neg Hx   ? Pancreatic cancer Neg Hx   ? Liver disease Neg Hx   ?  ?Social History  ? ?Tobacco Use  ? Smoking status: Former  ?  Types: Cigarettes  ?  Start date: 11/20/1976  ?  Quit date: 26  ?  Years since quitting: 37.2  ? Smokeless tobacco: Never  ? Tobacco comments:  ?  quit smoking 1986 ish  ?Substance Use Topics  ? Alcohol use: Yes  ?  Alcohol/week: 21.0 standard drinks  ?  Types: 21 Standard drinks or equivalent per week  ?  Comment: 2-3 beers per night  ?  ?Subjective:  ? ?Presents for yearly CPE; was in the ER in February 2023 with A. Fib; has been diagnosed with prostate cancer and will be having gold seed implant/ radiation in May 2023;  ? ?Review of Systems  ?Constitutional: Negative.   ?HENT: Negative.    ?Eyes: Negative.   ?Respiratory: Negative.    ?Cardiovascular: Negative.   ?Gastrointestinal: Negative.   ?Genitourinary: Negative.   ?Musculoskeletal: Negative.   ?Skin: Negative.   ?Neurological: Negative.   ?Endo/Heme/Allergies: Negative.   ?Psychiatric/Behavioral: Negative.    ? ? ?Objective:  ?Vitals:  ? 05/15/21 1511  ?BP: 122/70  ?Pulse: 70  ?Temp: 97.8 ?F (36.6 ?C)  ?TempSrc: Oral  ?SpO2: 98%   ?Weight: 200 lb 3.2 oz (90.8 kg)  ?Height: '5\' 11"'$  (1.803 m)  ?  ?General: Well developed, well nourished, in no acute distress  ?Skin : Warm and dry.  ?Head: Normocephalic and atraumatic  ?Eyes: Sclera and conjunctiva clear; pupils round and reactive to light; extraocular movements intact  ?Lungs: Respirations unlabored; clear to auscultation bilaterally without wheeze, rales, rhonchi  ?CVS exam: normal rate and regular rhythm.  ?Musculoskeletal: No deformities; no active joint inflammation  ?Extremities: No edema, cyanosis, clubbing  ?Vessels: Symmetric bilaterally  ?  Neurologic: Alert and oriented; speech intact; face symmetrical; moves all extremities well; CNII-XII intact without focal deficit  ? ?Assessment:  ?1. PE (physical exam), annual   ?2. OSA (obstructive sleep apnea)   ?  ?Plan:  ?Age appropriate preventive healthcare needs addressed; encouraged regular eye doctor and dental exams; encouraged regular exercise; will update labs and refills as needed today; follow-up to be determined; ?Congratulated patient on commitment to his health;  ?CPAP order updated as requested;  ? ?This visit occurred during the SARS-CoV-2 public health emergency.  Safety protocols were in place, including screening questions prior to the visit, additional usage of staff PPE, and extensive cleaning of exam room while observing appropriate contact time as indicated for disinfecting solutions.  ? ? ?No follow-ups on file.  ?Orders Placed This Encounter  ?Procedures  ? For home use only DME continuous positive airway pressure (CPAP)  ?  Patient is already established with your company; needs new machine; AttnIvin [name]; please let patient or myself know what else is needed  ?  Order Specific Question:   Length of Need  ?  Answer:   Lifetime  ?  Order Specific Question:   Patient has OSA or probable OSA  ?  Answer:   Yes  ?  Order Specific Question:   Is the patient currently using CPAP in the home  ?  Answer:   Yes  ?  Order Specific  Question:   Settings  ?  Answer:   Other see comments  ?  Order Specific Question:   Signs and symptoms of probable OSA  (select all that apply)  ?  Answer:   Choking  ?  Order Specific Question:   CPAP supp

## 2021-05-20 ENCOUNTER — Other Ambulatory Visit: Payer: Self-pay | Admitting: Cardiovascular Disease

## 2021-05-28 ENCOUNTER — Telehealth: Payer: Self-pay | Admitting: Family

## 2021-05-28 NOTE — Telephone Encounter (Signed)
Ov note faxed over. ?

## 2021-05-28 NOTE — Telephone Encounter (Signed)
[employer] is requesting ov notes from 4/4 as he is needing a new c-pap machine. This can be faxed to [phone].  ?

## 2021-05-31 NOTE — Progress Notes (Signed)
? ?Cardiology Clinic Note  ? ?Patient Name: Mario [name] ?Date of Encounter: 06/04/2021 ? ?Primary Care Provider:  Marrian Salvage, [place] ?Primary Cardiologist:  Shelva Majestic, MD ? ?Patient Profile  ?  ?Mario [name] 81 year old male presents the clinic today for follow-up evaluation of his atrial fibrillation, hypertension, and preoperative cardiac evaluation. ? ?Past Medical History  ?  ?Past Medical History:  ?Diagnosis Date  ? Aortic insufficiency   ? Arthritis   ? neck  ? History of kidney stones   ? Hypertension   ? Melanoma ([place]) 2000  ? back; stage 3, squamous 1 - basal- several  ? OSA (obstructive sleep apnea)   ? uses CPAP  ? Persistent atrial fibrillation ([place])   ? afib - cardioverted 01/2015  ? Phlebitis   ? Pneumonia   ? Seasonal allergies   ? Tubular adenoma of colon 2013  ? colonoscpy in 5 years [name]  ? ?Past Surgical History:  ?Procedure Laterality Date  ? AMPUTATION FINGER  left  ? ring finger first nuckle  ? ATRIAL FIBRILLATION ABLATION N/A 03/02/2020  ? Procedure: ATRIAL FIBRILLATION ABLATION;  Surgeon: [name], MD;  Location: [place] [facility];  Service: Cardiovascular;  Laterality: N/A;  ? BASAL CELL CARCINOMA EXCISION  02/09/2017  ? CARDIOVERSION N/A 12/01/2014  ? Procedure: CARDIOVERSION;  Surgeon: Troy Sine, MD;  Location: [place];  Service: Cardiovascular;  Laterality: N/A;  ? CARDIOVERSION N/A 02/22/2020  ? Procedure: CARDIOVERSION;  Surgeon: Buford Dresser, MD;  Location: [facility] ENDOSCOPY;  Service: Cardiovascular;  Laterality: N/A;  ? CATARACT EXTRACTION  2002, 2004  ? bilateral  ? CYSTOSCOPY    ? ELBOW SURGERY Left 1984  ? tendon repair left arm  ? INCISION AND DRAINAGE OF WOUND Left 08/28/2016  ? Procedure: IRRIGATION AND DEBRIDEMENT WOUND;  Surgeon: Iran Planas, MD;  Location: [place];  Service: Orthopedics;  Laterality: Left;  ? LITHOTRIPSY    ? melanoma removal  2000  ? back  ? METACARPAL OSTEOTOMY Left 08/28/2016  ? Procedure: 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  finger.;  Surgeon: Iran Planas, MD;  Location: [place];  Service: Orthopedics;  Laterality: Left;  ? MICROLARYNGOSCOPY WITH LASER N/A 01/25/2015  ? Procedure: MICROLARYNGOSCOPY ;  Surgeon: Melida Quitter, MD;  Location: [place];  Service: ENT;  Laterality: N/A;  Microlaryngoscopy with 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 with CO2 laser   ? TENDON REPAIR Right 2012  ? right  ? TONSILLECTOMY    ? UPPER GI ENDOSCOPY    ? ? ?Allergies ? ?Allergies  ?Allergen Reactions  ? Contrast Media [Iodinated Contrast Media] Shortness Of Breath, Rash and Other (See Comments)  ?  First noted after Coronary CTA 02/24/20. Pt experienced wheezing, shortness of breath and diffuse rash/welts over face and trunk.  ? Lisinopril Other (See Comments)  ?  REACTION: severe GI upset requiring EGD--temporally related to lisinopril--tolerated micardis  ? ? ?History of Present Illness  ?  ?Mario [name] has a PMH of HTN, atrial fibrillation, near syncope, OSA on CPAP, diverticulitis hypogonadism, cervical DJD, prostate cancer, melanoma, cataract, dizziness, secondary hypercoagulable state, and dizziness.  He underwent successful ablation by Dr. Rayann Heman 03/02/2020.  He was noted to have persistent atrial fibrillation isthmus dependent right atrial flutter and multiple left atrial flutter circuits.  He was taken off flecainide and carvedilol at that time. ? ?He was seen by Dr. Claiborne Billings on 12/25/2020.  During that time he was unaware of repeat/recurrent atrial fibrillation.  He was off flecainide and  carvedilol.  He did note occasional episodes of palpitations.  He had lost around 20 pounds.  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  He continues to use CPAP and his download showed 100% compliance.  He was averaging around 8 hours and 56 minutes of use nightly.  He reported exercising 4 days/week which included weight lifting and cardiovascular type exercises.  He reported compliance with his apixaban and denies bleeding issues.  His follow-up  echocardiogram showed mild aortic stenosis 02/17/2020. ? ?He is planning to undergo Gold seed prostate implant by Dr. Gloriann Loan on 06/27/2021. ? ?He presents to the clinic today for follow-up evaluation and preoperative cardiac evaluation.  He states he feels well.  He did have 1 episode of atrial fibrillation and he presented to the emergency department.  He spent the day in the emergency department and did not undergo DCCV.  He went home and converted spontaneously back to normal sinus rhythm.  He has not noticed any further sustained episodes of atrial fibrillation.  He does note occasional brief episodes of irregular heartbeat after a hard workout at the gym.  He has 1 further benchpress competition and reports that he will continue to be physically active and lift weights.  He feels that he is getting up to roll to do max lifts.  I have encouraged him to stay physically active.  His blood pressure at home is 120s over 60s.  Initially in the office today is 150/88 and on recheck is 122/68.  We will continue his current medication regimen.  His EKG today shows sinus rhythm with first-degree AV 94 bpm.  He feels that his heart rate is elevated because of his anxiousness related to being diagnosed with prostate cancer.  I will plan follow-up for 12 months. ? ?Today he denies chest pain, shortness of breath, lower extremity edema, fatigue, palpitations, melena, hematuria, hemoptysis, diaphoresis, weakness, presyncope, syncope, orthopnea, and PND. ? ? ?Home Medications  ?  ?Prior to Admission medications   ?Medication Sig Start Date End Date Taking? Authorizing Provider  ?hydrALAZINE (APRESOLINE) 25 MG tablet TAKE 1 TABLET BY MOUTH TWICE DAILY . APPOINTMENT REQUIRED FOR FUTURE REFILLS 05/21/21   Troy Sine, MD  ?amLODipine (NORVASC) 10 MG tablet Take 1 tablet (10 mg total) by mouth daily. 08/18/20   Allred, Jeneen Rinks, MD  ?apixaban Arne [place]) 5 MG TABS tablet Take 1 tablet by mouth twice daily 02/13/21   Troy Sine, MD   ?carvedilol (COREG) 3.125 MG tablet Take 1 tablet (3.125 mg total) by mouth 2 (two) times daily. 12/25/20 05/15/21  Troy Sine, MD  ?Cholecalciferol (DIALYVITE VITAMIN D 5000) 125 MCG (5000 UT) capsule Take 5,000 Units by mouth daily.    [provider]  ?fluticasone (FLONASE) 50 MCG/ACT nasal spray Place 1 spray into both nostrils daily. 09/09/20   [provider]  ?GLUCOSAMINE-CHONDROITIN PO Take 1 tablet by mouth daily.     [provider]  ?irbesartan (AVAPRO) 150 MG tablet Take 1 tablet (150 mg total) by mouth daily. 12/25/20   Troy Sine, MD  ?Lutein 20 MG CAPS Take 20 mg by mouth daily.    [provider]  ?Multiple Vitamin (MULTIVITAMIN) tablet Take 1 tablet by mouth daily.    [provider]  ?OVER THE COUNTER MEDICATION Place 4-5 drops into both eyes as needed (dry eyes). Thera tears    [provider]  ?Saw Palmetto, Serenoa repens, (SAW PALMETTO PO) Take 1 tablet by mouth daily.    [provider]  ?testosterone cypionate (DEPOTESTOSTERONE CYPIONATE) 200 MG/ML injection Inject 100 mg into the muscle every Wednesday. 08/09/14   [provider]  ?triamcinolone (KENALOG) 0.1 % Apply 1 application topically 2 (two) times daily as needed (red bumps). 01/21/20   [provider]  ? ? ?Family History  ?  ?Family History  ?Problem Relation Age of Onset  ? Heart disease Mother   ? Stroke Father   ? Other Father   ?     brain tumor-benign  ? Heart disease Sister   ? Heart disease Sister   ? Hypertension Sister   ? Colon cancer Neg Hx   ? Stomach cancer Neg Hx   ? Esophageal cancer Neg Hx   ? Pancreatic cancer Neg Hx   ? Liver disease Neg Hx   ? ?He indicated that his mother is deceased. He indicated that his father is deceased. He indicated that both of his sisters are alive. He indicated that the status of his neg hx is unknown. ? ?Social History  ?  ?Social History  ? ?Socioeconomic History  ? Marital status: Married  ?   Spouse name: Not on file  ? Number of children: 2  ? Years of education: Not on file  ? Highest education level: Not on file  ?Occupational History  ? Occupation: retired  ?Tobacco Use  ? Smoking status: Former

## 2021-06-04 ENCOUNTER — Encounter: Payer: Self-pay | Admitting: General Practice

## 2021-06-04 ENCOUNTER — Ambulatory Visit: Payer: Medicare HMO | Admitting: General Practice

## 2021-06-04 VITALS — BP 128/76 | HR 94 | Ht 72.0 in | Wt 205.5 lb

## 2021-06-04 DIAGNOSIS — I4819 Other persistent atrial fibrillation: Secondary | ICD-10-CM

## 2021-06-04 DIAGNOSIS — G4733 Obstructive sleep apnea (adult) (pediatric): Secondary | ICD-10-CM

## 2021-06-04 DIAGNOSIS — I350 Nonrheumatic aortic (valve) stenosis: Secondary | ICD-10-CM

## 2021-06-04 DIAGNOSIS — I10 Essential (primary) hypertension: Secondary | ICD-10-CM | POA: Diagnosis not present

## 2021-06-04 DIAGNOSIS — Z9989 Dependence on other enabling machines and devices: Secondary | ICD-10-CM

## 2021-06-04 DIAGNOSIS — Z01810 Encounter for preprocedural cardiovascular examination: Secondary | ICD-10-CM | POA: Diagnosis not present

## 2021-06-04 NOTE — Patient Instructions (Signed)
Medication Instructions:  ?The current medical regimen is effective;  continue present plan and medications as directed. Please refer to the Current Medication list given to you today.  ? ?*If you need a refill on your cardiac medications before your next appointment, please call your pharmacy* ? ?Lab Work:   Testing/Procedures:  ?NONE    NONE ? ?If you have labs (blood work) drawn today and your tests are completely normal, you will receive your results only by: ?MyChart Message (if you have MyChart) OR  A paper copy in the mail ?If you have any lab test that is abnormal or we need to change your treatment, we will call you to review the results. ? ?Special Instructions ?PLEASE READ AND FOLLOW SALTY 6-ATTACHED-1,'800mg'$  daily ? ?Please try to avoid these triggers: ?Do not use any products that have nicotine or tobacco in them. These include cigarettes, e-cigarettes, and chewing tobacco. If you need help quitting, ask your doctor. ?Eat heart-healthy foods. Talk with your doctor about the right eating plan for you. ?Exercise regularly as told by your doctor. ?Stay hydrated ?Do not drink alcohol, Caffeine or chocolate. ?Lose weight if you are overweight. ?Do not use drugs, including cannabis ?  ?CLEARED FOR UPCOMING PROCEDURE ? ?Follow-Up: ?Your next appointment:  12 month(s) In Person with Shelva Majestic, MD   ? ?Please call our office 2 months in advance to schedule this appointment  :1 ? ?At [facility], you and your health needs are our priority.  As part of our continuing mission to provide you with exceptional heart care, we have created designated Provider Care Teams.  These Care Teams include your primary Cardiologist (physician) and Advanced Practice Providers (APPs -  Physician Assistants and Nurse Practitioners) who all work together to provide you with the care you need, when you need it. ? ?We recommend signing up for the patient portal called "MyChart".  Sign up information is provided on this After Visit  Summary.  MyChart is used to connect with patients for Virtual Visits (Telemedicine).  Patients are able to view lab/test results, encounter notes, upcoming appointments, etc.  Non-urgent messages can be sent to your provider as well.   ?To learn more about what you can do with MyChart, go to [email or web address].   ? ? ?Important Information About Sugar ? ? ? ? ? ? ?        6 SALTY THINGS TO AVOID     1,'800MG'$  DAILY ? ? ? ? ? ? ?

## 2021-06-13 ENCOUNTER — Telehealth: Payer: Self-pay | Admitting: Family

## 2021-06-13 NOTE — Telephone Encounter (Signed)
Left message for patient to call back and schedule Medicare Annual Wellness Visit (AWV) either virtually or phone ? ?Last AWV ;10/29/19 ?please schedule at anytime with health coach ? ?I left my direct # [phone] ?

## 2021-06-25 ENCOUNTER — Other Ambulatory Visit: Payer: Self-pay | Admitting: Cardiovascular Disease

## 2021-06-25 ENCOUNTER — Telehealth: Payer: Self-pay | Admitting: *Deleted

## 2021-06-25 ENCOUNTER — Other Ambulatory Visit: Payer: Self-pay

## 2021-06-25 ENCOUNTER — Encounter (HOSPITAL_BASED_OUTPATIENT_CLINIC_OR_DEPARTMENT_OTHER): Payer: Self-pay | Admitting: Urology

## 2021-06-25 NOTE — Telephone Encounter (Signed)
CALLED PATIENT TO REMIND OF SIM APPT. FOR 06-29-21- ARRIVAL TIME- 12:45 PM @ [facility], INFORMED PATIENT TO ARRIVE WITH A FULL BLADDER AND AN EMPTY BOWEL, SPOKE WITH PATIENT AND HE IS AWARE OF THIS APPT. ?

## 2021-06-25 NOTE — Progress Notes (Signed)
Spoke w/ via phone for pre-op interview--- pt ?Lab needs dos----   istat            ?Lab results------ current ekg in epic/ chart ?COVID test -----patient states asymptomatic no test needed ?Arrive at ------- 0830 on 06-27-2021 ?NPO after MN NO Solid Food.  Clear liquids from MN until--- 0730 ?Med rec completed ?Medications to take morning of surgery ----- hydralazine, coreg, norvasc ?Diabetic medication ----- n/a ?Patient instructed no nail polish to be worn day of surgery ?Patient instructed to bring photo id and insurance card day of surgery ?Patient aware to have Driver (ride ) / caregiver  for 24 hours after surgery --- wife, bunny ?Patient Special Instructions ----- will do one fleet enema night before surgery ?Pre-Op special Istructions ----- pt has cardiac office visit clearance note by Coletta Memos NP on 06-04-2021 in epic/ chart ?Patient verbalized understanding of instructions that were given at this phone interview. ?Patient denies shortness of breath, chest pain, fever, cough at this phone interview.  ? ? ?Anesthesia Review:  HTN;  Persistent AFib s/p DCCV x2 and s/p ablation 03-04-2020; OSA w/ cpap, uses nightly per pt;  hx malignant melanoma of back 2001 per pt localized w/ no recurrence ?Pt denies cardiac s&s , dos, and no peripheral swelling ? ?PCP: Jodi Mourning NP (lov 05-15-2021 epic) ?Cardiologist : Dr Claiborne Billings (lov 06-04-2021 epic) ?Chest x-ray : 03-30-2021 epic ?EKG : 06-04-2021 epic ?Echo : 02-17-2020 ?Stress test: nuclear 10-07-2014 ?Cardiac Cath : no ?Activity level: denies sob w/ any activity ?Sleep Study/ CPAP : Yes/ Yes ? ?Blood Thinner/ Instructions /Last Dose: Eliquis ?ASA / Instructions/ Last Dose :  NO ?Pt stated was given instructions when to stop eliquis prior to surgery by cardiology, llast dose 06-23-2021 ?

## 2021-06-27 ENCOUNTER — Ambulatory Visit (HOSPITAL_BASED_OUTPATIENT_CLINIC_OR_DEPARTMENT_OTHER): Payer: Medicare HMO | Admitting: Anesthesiology

## 2021-06-27 ENCOUNTER — Ambulatory Visit (HOSPITAL_BASED_OUTPATIENT_CLINIC_OR_DEPARTMENT_OTHER)
Admission: RE | Admit: 2021-06-27 | Discharge: 2021-06-27 | Disposition: A | Discharge status: Home / Self Care | Payer: Medicare HMO | Attending: Urology | Admitting: Urology

## 2021-06-27 ENCOUNTER — Encounter (HOSPITAL_BASED_OUTPATIENT_CLINIC_OR_DEPARTMENT_OTHER)
Admission: RE | Disposition: A | Discharge status: Home / Self Care | Payer: Self-pay | Source: Home / Self Care | Attending: Urology

## 2021-06-27 ENCOUNTER — Encounter (HOSPITAL_BASED_OUTPATIENT_CLINIC_OR_DEPARTMENT_OTHER): Payer: Self-pay | Admitting: Urology

## 2021-06-27 DIAGNOSIS — Z01818 Encounter for other preprocedural examination: Secondary | ICD-10-CM

## 2021-06-27 DIAGNOSIS — I10 Essential (primary) hypertension: Secondary | ICD-10-CM | POA: Insufficient documentation

## 2021-06-27 DIAGNOSIS — I4819 Other persistent atrial fibrillation: Secondary | ICD-10-CM | POA: Diagnosis not present

## 2021-06-27 DIAGNOSIS — I4891 Unspecified atrial fibrillation: Secondary | ICD-10-CM

## 2021-06-27 DIAGNOSIS — G4730 Sleep apnea, unspecified: Secondary | ICD-10-CM | POA: Diagnosis not present

## 2021-06-27 DIAGNOSIS — I2510 Atherosclerotic heart disease of native coronary artery without angina pectoris: Secondary | ICD-10-CM | POA: Diagnosis not present

## 2021-06-27 DIAGNOSIS — Z7901 Long term (current) use of anticoagulants: Secondary | ICD-10-CM | POA: Diagnosis not present

## 2021-06-27 DIAGNOSIS — M1990 Unspecified osteoarthritis, unspecified site: Secondary | ICD-10-CM | POA: Diagnosis not present

## 2021-06-27 DIAGNOSIS — Z79899 Other long term (current) drug therapy: Secondary | ICD-10-CM | POA: Diagnosis not present

## 2021-06-27 DIAGNOSIS — Z87891 Personal history of nicotine dependence: Secondary | ICD-10-CM | POA: Insufficient documentation

## 2021-06-27 DIAGNOSIS — C61 Malignant neoplasm of prostate: Secondary | ICD-10-CM | POA: Diagnosis not present

## 2021-06-27 HISTORY — DX: Testicular hypofunction: E29.1

## 2021-06-27 HISTORY — DX: Dependence on other enabling machines and devices: Z99.89

## 2021-06-27 HISTORY — DX: Personal history of colonic polyps: Z86.010

## 2021-06-27 HISTORY — DX: Benign prostatic hyperplasia with lower urinary tract symptoms: N40.1

## 2021-06-27 HISTORY — DX: Presence of external hearing-aid: Z97.4

## 2021-06-27 HISTORY — DX: Long term (current) use of anticoagulants: Z79.01

## 2021-06-27 HISTORY — DX: Male erectile dysfunction, unspecified: N52.9

## 2021-06-27 HISTORY — DX: Personal history of adenomatous and serrated colon polyps: Z86.0101

## 2021-06-27 HISTORY — DX: Other injury of unspecified body region, initial encounter: T14.8XXA

## 2021-06-27 HISTORY — DX: Atrioventricular block, first degree: I44.0

## 2021-06-27 HISTORY — PX: GOLD SEED IMPLANT: SHX6343

## 2021-06-27 HISTORY — DX: Personal history of other diseases of the nervous system and sense organs: Z86.69

## 2021-06-27 HISTORY — DX: Thoracic aortic ectasia: I77.810

## 2021-06-27 HISTORY — DX: Personal history of other malignant neoplasm of skin: Z85.828

## 2021-06-27 HISTORY — PX: SPACE OAR INSTILLATION: SHX6769

## 2021-06-27 HISTORY — DX: Obstructive sleep apnea (adult) (pediatric): G47.33

## 2021-06-27 LAB — POCT I-STAT, CHEM 8
BUN: 37 mg/dL — ABNORMAL HIGH (ref 8–23)
Calcium, Ion: 1.26 mmol/L (ref 1.15–1.40)
Chloride: 104 mmol/L (ref 98–111)
Creatinine, Ser: 1.2 mg/dL (ref 0.61–1.24)
Glucose, Bld: 97 mg/dL (ref 70–99)
HCT: 46 % (ref 39.0–52.0)
Hemoglobin: 15.6 g/dL (ref 13.0–17.0)
Potassium: 5.1 mmol/L (ref 3.5–5.1)
Sodium: 137 mmol/L (ref 135–145)
TCO2: 26 mmol/L (ref 22–32)

## 2021-06-27 SURGERY — INSERTION, GOLD SEEDS
Anesthesia: Monitor Anesthesia Care | Site: Prostate

## 2021-06-27 MED ORDER — SODIUM CHLORIDE (PF) 0.9 % IJ SOLN
INTRAMUSCULAR | Status: DC | PRN
  Administered 2021-06-27: 10 mL

## 2021-06-27 MED ORDER — ACETAMINOPHEN 500 MG PO TABS
ORAL_TABLET | ORAL | Status: AC
  Filled 2021-06-27: qty 2

## 2021-06-27 MED ORDER — ONDANSETRON HCL 4 MG/2ML IJ SOLN
INTRAMUSCULAR | Status: DC | PRN
  Administered 2021-06-27: 4 mg via INTRAVENOUS

## 2021-06-27 MED ORDER — ACETAMINOPHEN 500 MG PO TABS
1000.0000 mg | ORAL_TABLET | Freq: Once | ORAL | Status: AC
  Administered 2021-06-27: 1000 mg via ORAL

## 2021-06-27 MED ORDER — BUPIVACAINE HCL 0.25 % IJ SOLN
INTRAMUSCULAR | Status: DC | PRN
  Administered 2021-06-27: 10 mL

## 2021-06-27 MED ORDER — LIDOCAINE HCL (PF) 2 % IJ SOLN
INTRAMUSCULAR | Status: AC
  Filled 2021-06-27: qty 5

## 2021-06-27 MED ORDER — FENTANYL CITRATE (PF) 100 MCG/2ML IJ SOLN
INTRAMUSCULAR | Status: DC | PRN
  Administered 2021-06-27 (×2): 50 ug via INTRAVENOUS

## 2021-06-27 MED ORDER — OXYCODONE HCL 5 MG/5ML PO SOLN: 5.0000 mg | Freq: Once | ORAL | Status: DC | PRN

## 2021-06-27 MED ORDER — FENTANYL CITRATE (PF) 100 MCG/2ML IJ SOLN
INTRAMUSCULAR | Status: AC
  Filled 2021-06-27: qty 2

## 2021-06-27 MED ORDER — CEFAZOLIN SODIUM-DEXTROSE 2-3 GM-%(50ML) IV SOLR
INTRAVENOUS | Status: DC | PRN
  Administered 2021-06-27: 2 g via INTRAVENOUS

## 2021-06-27 MED ORDER — ONDANSETRON HCL 4 MG/2ML IJ SOLN
INTRAMUSCULAR | Status: AC
  Filled 2021-06-27: qty 2

## 2021-06-27 MED ORDER — LIDOCAINE HCL (CARDIAC) PF 100 MG/5ML IV SOSY
PREFILLED_SYRINGE | INTRAVENOUS | Status: DC | PRN
  Administered 2021-06-27: 40 mg via INTRAVENOUS

## 2021-06-27 MED ORDER — FENTANYL CITRATE (PF) 100 MCG/2ML IJ SOLN: 25.0000 ug | INTRAMUSCULAR | Status: DC | PRN

## 2021-06-27 MED ORDER — PROPOFOL 10 MG/ML IV BOLUS
INTRAVENOUS | Status: DC | PRN
  Administered 2021-06-27 (×2): 20 mg via INTRAVENOUS

## 2021-06-27 MED ORDER — LACTATED RINGERS IV SOLN: INTRAVENOUS | Status: DC

## 2021-06-27 MED ORDER — PROPOFOL 500 MG/50ML IV EMUL
INTRAVENOUS | Status: DC | PRN
  Administered 2021-06-27: 75 ug/kg/min via INTRAVENOUS

## 2021-06-27 MED ORDER — FLEET ENEMA 7-19 GM/118ML RE ENEM: 1.0000 | ENEMA | Freq: Once | RECTAL | Status: DC

## 2021-06-27 MED ORDER — PROPOFOL 500 MG/50ML IV EMUL
INTRAVENOUS | Status: AC
  Filled 2021-06-27: qty 50

## 2021-06-27 MED ORDER — OXYCODONE HCL 5 MG PO TABS: 5.0000 mg | ORAL_TABLET | Freq: Once | ORAL | Status: DC | PRN

## 2021-06-27 MED ORDER — CEFAZOLIN SODIUM-DEXTROSE 2-4 GM/100ML-% IV SOLN
INTRAVENOUS | Status: AC
  Filled 2021-06-27: qty 100

## 2021-06-27 SURGICAL SUPPLY — 26 items
BLADE CLIPPER SENSICLIP SURGIC (BLADE) ×4 IMPLANT
CNTNR URN SCR LID CUP LEK RST (MISCELLANEOUS) ×3 IMPLANT
CONT SPEC 4OZ STRL OR WHT (MISCELLANEOUS) ×3
COVER BACK TABLE 60X90IN (DRAPES) ×4 IMPLANT
DRSG TEGADERM 4X4.75 (GAUZE/BANDAGES/DRESSINGS) ×4 IMPLANT
DRSG TEGADERM 8X12 (GAUZE/BANDAGES/DRESSINGS) ×4 IMPLANT
GAUZE SPONGE 4X4 12PLY STRL (GAUZE/BANDAGES/DRESSINGS) ×4 IMPLANT
GAUZE SPONGE 4X4 12PLY STRL LF (GAUZE/BANDAGES/DRESSINGS) ×1 IMPLANT
GLOVE BIO SURGEON STRL SZ7.5 (GLOVE) ×4 IMPLANT
GLOVE ECLIPSE 8.0 STRL XLNG CF (GLOVE) ×4 IMPLANT
GLOVE SURG ORTHO 8.5 STRL (GLOVE) ×4 IMPLANT
IMPL SPACEOAR VUE SYSTEM (Spacer) ×3 IMPLANT
IMPLANT SPACEOAR VUE SYSTEM (Spacer) ×3 IMPLANT
KIT TURNOVER CYSTO (KITS) ×4 IMPLANT
MARKER GOLD PRELOAD 1.2X3 (Urological Implant) ×3 IMPLANT
MARKER SKIN DUAL TIP RULER LAB (MISCELLANEOUS) ×4 IMPLANT
NDL SPNL 22GX3.5 QUINCKE BK (NEEDLE) IMPLANT
NEEDLE SPNL 22GX3.5 QUINCKE BK (NEEDLE) IMPLANT
SEED GOLD PRELOAD 1.2X3 (Urological Implant) ×3 IMPLANT
SHEATH ULTRASOUND LF (SHEATH) IMPLANT
SHEATH ULTRASOUND LTX NONSTRL (SHEATH) IMPLANT
SURGILUBE 2OZ TUBE FLIPTOP (MISCELLANEOUS) ×4 IMPLANT
SYR 10ML LL (SYRINGE) IMPLANT
SYR CONTROL 10ML LL (SYRINGE) ×4 IMPLANT
TOWEL OR 17X26 10 PK STRL BLUE (TOWEL DISPOSABLE) ×4 IMPLANT
UNDERPAD 30X36 HEAVY ABSORB (UNDERPADS AND DIAPERS) ×4 IMPLANT

## 2021-06-27 NOTE — Transfer of Care (Signed)
Immediate Anesthesia Transfer of Care Note ? ?Patient: Mario [name] ? ?Procedure(s) Performed: GOLD SEED IMPLANT (Prostate) ?SPACE OAR INSTILLATION (Perineum) ? ?Patient Location: PACU ? ?Anesthesia Type:MAC ? ?Level of Consciousness: awake, alert  and oriented ? ?Airway & Oxygen Therapy: Patient Spontanous Breathing ? ?Post-op Assessment: Report given to RN and Post -op Vital signs reviewed and stable ? ?Post vital signs: Reviewed and stable ? ?Last Vitals:  ?Vitals Value Taken Time  ?BP 121/60 06/27/21 1037  ?Temp    ?Pulse 45 06/27/21 1043  ?Resp 17 06/27/21 1043  ?SpO2 93 % 06/27/21 1043  ?Vitals shown include unvalidated device data. ? ?Last Pain:  ?Vitals:  ? 06/27/21 0843  ?TempSrc: Oral  ?PainSc: 0-No pain  ?   ? ?Patients Stated Pain Goal: 5 (06/27/21 3073) ? ?Complications: No notable events documented. ?

## 2021-06-27 NOTE — H&P (Signed)
H&P ? ?Chief Complaint: Prostate cancer ? ?History of Present Illness: 81 year old male with prostate cancer presents for marker placement with SpaceOAR. ? ?Past Medical History:  ?Diagnosis Date  ? Anticoagulant long-term use   ? eliquis--- managed by cardiology  ? Arthritis   ? neck  ? Benign localized prostatic hyperplasia with lower urinary tract symptoms (LUTS)   ? Blister   ? 06-25-2021  per pt has a small blister at end of tailbone from doing sit-up's, covers w/ band-aid  ? ED (erectile dysfunction)   ? First degree heart block   ? History of adenomatous polyp of colon   ? History of basal cell carcinoma (BCC) of skin   ? excision multiple area's  ? History of kidney stones   ? History of malignant melanoma of back 2000  ? mid back  s/p WLE 12/ 2011 stage 1, localized per pt and no recurrence  ? History of narrow angle glaucoma   ? per pt s/p laser both eyes no issues since approx 2006  ? History of squamous cell carcinoma of skin   ? excision multiple area's  ? Hypertension   ? Hypogonadism in male   ? Malignant neoplasm prostate [facility]) 03/2021  ? urologist--- dr Latiesha Harada/  radiation oncology-- dr Tammi Klippel;  dx 02/ 2081m Gleason 3+4  ? 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 (HCC)   ? per cardiac CT 02-24-2020   463m ? OSA on CPAP   ? per pt uses nightly  ? Persistent atrial fibrillation ([place]  ? cardiologist--- dr t. keClaiborne Billings  [place]-12-2020 and 12-01-2014;  03-02-2020  s/p afib ablation by Dr AlRayann Heman? Seasonal allergies   ? Wears hearing aid in both ears   ? ?Past Surgical History:  ?Procedure Laterality Date  ? ATRIAL FIBRILLATION ABLATION N/A 03/02/2020  ? Procedure: ATRIAL FIBRILLATION ABLATION;  Surgeon: AlThompson GrayerMD;  [facility];  Service: Cardiovascular;  Laterality: N/A;  ? CARDIOVERSION N/A 12/01/2014  ? Procedure: CARDIOVERSION;  Surgeon: ThTroy SineMD;  [facility] [facility] Service: Cardiovascular;  Laterality: N/A;  ? CARDIOVERSION N/A 02/22/2020  ? Procedure:  CARDIOVERSION;  Surgeon: ChBuford DresserMD;  [facility];  Service: Cardiovascular;  Laterality: N/A;  ? CATARACT EXTRACTION W/ INTRAOCULAR LENS IMPLANT Bilateral   ? 2000; 2004  ? COLONOSCOPY WITH ESOPHAGOGASTRODUODENOSCOPY (EGD)  09/2019  ? CYSTOSCOPY W/ URETEROSCOPY W/ LITHOTRIPSY  05/12/2003  ? '@[facility]'$   ? ELBOW SURGERY Left 1984  ? tendon repair left arm  ? INCISION AND DRAINAGE OF WOUND Left 08/28/2016  ? Procedure: IRRIGATION AND DEBRIDEMENT WOUND;  Surgeon: OrIran PlanasMD;  [place] [facility]: Orthopedics;  Laterality: Left;  ? LIPOMA EXCISION Right 04/18/2010  ? right thigh  ? MASS EXCISION Left 07/2001  ? excision left axila mass (benign)  ? MELANOMA EXCISION  01/2000  ? @ [facility];   WLE of mid back for melanoma and excision resection lipoma upper back  ? METACARPAL OSTEOTOMY Left 08/28/2016  ? 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 MICROLARYNGOSCOPY WITH LASER N/A 01/25/2015  ? Procedure: MICROLARYNGOSCOPY ;  Surgeon: DwMelida QuitterMD;  [place] [facility] Service: ENT;  Laterality: N/A;  Microlaryngoscopy with 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 with CO2 laser   ? SATURATION BIOPSY OF PROSTATE  01/18/2005  ? '@[facility]   w/ anesthesia  ? TENDON REPAIR Right  2012  ? right  involving elbow, arm. hand  ? TONSILLECTOMY  1970  ? ? ?Home Medications:  ?Medications Prior to Admission  ?Medication Sig Dispense Refill Last Dose  ? amLODipine (NORVASC) 10 MG tablet TAKE 1 TABLET BY MOUTH ONCE DAILY . APPOINTMENT REQUIRED FOR FUTURE REFILLS (Patient taking differently: Take 10 mg by mouth daily.) 90 tablet 0   ? apixaban (ELIQUIS) 5 MG TABS tablet Take 1 tablet by mouth twice daily (Patient taking differently: Take 5 mg by mouth 2 (two) times daily. Take 1 tablet by mouth twice daily) 180 tablet 1 06/23/2021  ? carvedilol (COREG) 3.125 MG tablet Take 1 tablet (3.125 mg total) by mouth 2  (two) times daily. 180 tablet 3   ? fluticasone (FLONASE) 50 MCG/ACT nasal spray Place 1 spray into both nostrils at bedtime.     ? GLUCOSAMINE-CHONDROITIN PO Take 1 tablet by mouth daily.      ? hydrALAZINE (APRESOLINE) 25 MG tablet TAKE 1 TABLET BY MOUTH TWICE DAILY . APPOINTMENT REQUIRED FOR FUTURE REFILLS (Patient taking differently: Take 25 mg by mouth 2 (two) times daily.) 180 tablet 1   ? irbesartan (AVAPRO) 150 MG tablet Take 1 tablet (150 mg total) by mouth daily. (Patient taking differently: Take 150 mg by mouth daily.) 90 tablet 3   ? Lutein 20 MG CAPS Take 20 mg by mouth daily.     ? Multiple Vitamin (MULTIVITAMIN) tablet Take 1 tablet by mouth daily.     ? OVER THE COUNTER MEDICATION Place 4-5 drops into both eyes as needed (dry eyes). Thera tears     ? psyllium (METAMUCIL) 58.6 % powder Take 1 packet by mouth at bedtime.     ? Saw Palmetto, Serenoa repens, (SAW PALMETTO PO) Take 1 tablet by mouth daily.     ? testosterone cypionate (DEPOTESTOSTERONE CYPIONATE) 200 MG/ML injection Inject 100 mg into the muscle every Wednesday.     ? triamcinolone (KENALOG) 0.1 % Apply 1 application topically 2 (two) times daily as needed (red bumps).     ? Cholecalciferol (DIALYVITE VITAMIN D 5000) 125 MCG (5000 UT) capsule Take 5,000 Units by mouth daily. (Patient not taking: Reported on 06/25/2021)   Not Taking  ? ?Allergies:  ?Allergies  ?Allergen Reactions  ? Contrast Media [Iodinated Contrast Media] Shortness Of Breath, Rash and Other (See Comments)  ?  First noted after Coronary CTA 02/24/20. Pt experienced wheezing, shortness of breath and diffuse rash/welts over face and trunk.  ? Lisinopril Other (See Comments)  ?  REACTION: severe GI upset requiring EGD--temporally related to lisinopril--tolerated micardis  ? ? ?Family History  ?Problem Relation Age of Onset  ? Heart disease Mother   ? Stroke Father   ? Other Father   ?     brain tumor-benign  ? Heart disease Sister   ? Heart disease Sister   ? Hypertension  Sister   ? Colon cancer Neg Hx   ? Stomach cancer Neg Hx   ? Esophageal cancer Neg Hx   ? Pancreatic cancer Neg Hx   ? Liver disease Neg Hx   ? ?Social History:  reports that he quit smoking about 37 years ago. His smoking use included cigarettes. He started smoking about 44 years ago. He has never used smokeless tobacco. He reports that he does not currently use alcohol. He reports that he does not use drugs. ? ?ROS: ?A complete review of systems was performed.  All systems are negative except for pertinent findings as noted. ?  ROS ? ? ?Physical Exam:  ?Vital signs in last 24 hours: ?  ?General:  Alert and oriented, No acute distress ?HEENT: Normocephalic, atraumatic ?Neck: No JVD or lymphadenopathy ?Cardiovascular: Regular rate and rhythm ?Lungs: Regular rate and effort ?Abdomen: Soft, nontender, nondistended, no abdominal masses ?Back: No CVA tenderness ?Extremities: No edema ?Neurologic: Grossly intact ? ?Laboratory Data:  ?No results found for this or any previous visit (from the past 24 hour(s)). ?No results found for this or any previous visit (from the past 240 hour(s)). ?Creatinine: ?No results for input(s): CREATININE in the last 168 hours. ? ?Impression/Assessment:  ?Prostate cancer ? ?Plan:  ?Proceed with marker placement and SpaceOAR.  Risk benefits discussed including but not limited to bleeding, infection, injury to surrounding structures, rectal perforation, severe rectal pain and discomfort, pelvic pain and discomfort, development of rectal ulceration among other imponderables. ? ?Marton Redwood, III ?06/27/2021, 8:25 AM  ? ?

## 2021-06-27 NOTE — Anesthesia Preprocedure Evaluation (Addendum)
Anesthesia Evaluation  ?Patient identified by MRN, date of birth, ID band ?Patient awake ? ? ? ?Reviewed: ?Allergy & Precautions, NPO status , Patient's Chart, lab work & pertinent test results, reviewed documented beta blocker date and time  ? ?History of Anesthesia Complications ?Negative for: history of anesthetic complications ? ?Airway ?Mallampati: II ? ?TM Distance: >3 FB ?Neck ROM: Full ? ? ? Dental ? ?(+) Missing,  ?  ?Pulmonary ?sleep apnea and Continuous Positive Airway Pressure Ventilation , former smoker,  ?  ?Pulmonary exam normal ? ? ? ? ? ? ? Cardiovascular ?hypertension, Pt. on medications and Pt. on home beta blockers ?+ CAD  ?Normal cardiovascular exam+ dysrhythmias (on Eliquis) Atrial Fibrillation  ? ? ?  ?Neuro/Psych ?negative neurological ROS ? negative psych ROS  ? GI/Hepatic ?negative GI ROS, Neg liver ROS,   ?Endo/Other  ?negative endocrine ROS ? Renal/GU ?negative Renal ROS  ? ?  ?Musculoskeletal ? ?(+) Arthritis ,  ? Abdominal ?  ?Peds ? Hematology ?negative hematology ROS ?(+)   ?Anesthesia Other Findings ?Prostate ca ? Reproductive/Obstetrics ? ?  ? ? ? ? ? ? ? ? ? ? ? ? ? ?  ?  ? ? ? ? ? ? ? ?Anesthesia Physical ?Anesthesia Plan ? ?ASA: 3 ? ?Anesthesia Plan: MAC  ? ?Post-op Pain Management: Tylenol PO (pre-op)*  ? ?Induction:  ? ?PONV Risk Score and Plan: 1 and Treatment may vary due to age or medical condition and Propofol infusion ? ?Airway Management Planned: Natural Airway and Simple Face Mask ? ?Additional Equipment: None ? ?Intra-op Plan:  ? ?Post-operative Plan:  ? ?Informed Consent: I have reviewed the patients History and Physical, chart, labs and discussed the procedure including the risks, benefits and alternatives for the proposed anesthesia with the patient or authorized representative who has indicated his/her understanding and acceptance.  ? ? ? ? ? ?Plan Discussed with: CRNA ? ?Anesthesia Plan Comments:   ? ? ? ? ? ?Anesthesia Quick  Evaluation ? ?

## 2021-06-27 NOTE — Op Note (Signed)
Preoperative diagnosis: Clinically localized adenocarcinoma of the prostate  ? ?Postoperative diagnosis: Clinically localized adenocarcinoma of the prostate ? ?Procedure: 1) Placement of fiducial markers into prostate ?                   2) Insertion of SpaceOAR hydrogel  ? ?Surgeon: Link Snuffer, M.D. ? ?Anesthesia: MAC sedation ? ?EBL: Minimal ? ?Complications: None ? ?Indication: Mario [name] is a 82 y.o. gentleman with clinically localized prostate cancer. After discussing management options for treatment, he elected to proceed with radiotherapy. He presents today for the above procedures. The potential risks, complications, alternative options, and expected recovery course have been discussed in detail with the patient and he has provided informed consent to proceed. ? ?Description of procedure: The patient was administered preoperative antibiotics, placed in the dorsal lithotomy position, and prepped and draped in the usual sterile fashion.  next, transrectal ultrasonography was utilized to visualize the prostate.  The perineum was anesthetized with quarter percent Marcaine.  Three gold fiducial markers were then placed into the prostate via transperineal needles under ultrasound guidance at the right apex, right base, and left mid gland under direct ultrasound guidance. A site in the midline was then selected on the perineum for placement of an 18 g needle with saline. The needle was advanced above the rectum and below Denonvillier's fascia to the mid gland and confirmed to be in the midline on transverse imaging. One cc of saline was injected confirming appropriate expansion of this space. A total of 5 cc of saline was then injected to open the space further bilaterally. The saline syringe was then removed and the SpaceOAR hydrogel was injected with good distribution bilaterally. He tolerated the procedure well and without complications. He was given a voiding trial prior to discharge from the PACU. ? ?

## 2021-06-27 NOTE — Anesthesia Postprocedure Evaluation (Signed)
Anesthesia Post Note ? ?Patient: Mario [name] ? ?Procedure(s) Performed: GOLD SEED IMPLANT (Prostate) ?SPACE OAR INSTILLATION (Perineum) ? ?  ? ?Patient location during evaluation: PACU ?Anesthesia Type: MAC ?Level of consciousness: awake and alert ?Pain management: pain level controlled ?Vital Signs Assessment: post-procedure vital signs reviewed and stable ?Respiratory status: spontaneous breathing, nonlabored ventilation and respiratory function stable ?Cardiovascular status: blood pressure returned to baseline ?Postop Assessment: no apparent nausea or vomiting ?Anesthetic complications: no ? ? ?No notable events documented. ? ?Last Vitals:  ?Vitals:  ? 06/27/21 1052 06/27/21 1055  ?BP:    ?Pulse: (!) 49 60  ?Resp: 14   ?Temp:    ?SpO2: 93%   ?  ?Last Pain:  ?Vitals:  ? 06/27/21 1038  ?TempSrc:   ?PainSc: 0-No pain  ? ? ?  ?  ?  ?  ?  ?  ? ?Marthenia Rolling ? ? ? ? ?

## 2021-06-27 NOTE — Discharge Instructions (Addendum)
You may restart your blood thinner after the blood in the urine resolves. ? ? ?No acetaminophen/Tylenol until after 3:00pm today if needed for pain. ? ? ? ?Post Anesthesia Home Care Instructions ? ?Activity: ?Get plenty of rest for the remainder of the day. A responsible individual must stay with you for 24 hours following the procedure.  ?For the next 24 hours, DO NOT: ?-Drive a car ?-Paediatric nurse ?-Drink alcoholic beverages ?-Take any medication unless instructed by your physician ?-Make any legal decisions or sign important papers. ? ?Meals: ?Start with liquid foods such as gelatin or soup. Progress to regular foods as tolerated. Avoid greasy, spicy, heavy foods. If nausea and/or vomiting occur, drink only clear liquids until the nausea and/or vomiting subsides. Call your physician if vomiting continues. ? ?Special Instructions/Symptoms: ?Your throat may feel dry or sore from the anesthesia or the breathing tube placed in your throat during surgery. If this causes discomfort, gargle with warm salt water. The discomfort should disappear within 24 hours.    ?

## 2021-06-28 ENCOUNTER — Telehealth: Payer: Self-pay | Admitting: *Deleted

## 2021-06-28 ENCOUNTER — Encounter (HOSPITAL_BASED_OUTPATIENT_CLINIC_OR_DEPARTMENT_OTHER): Payer: Self-pay | Admitting: Urology

## 2021-06-28 NOTE — Telephone Encounter (Signed)
CALLED PATIENT TO REMIND OF SIM APPT. FOR 06-29-21- ARRIVAL TIME- 12:45 PM @ [facility], INFORMED PATIENT TO ARRIVE WITH A FULL BLADDER AND AN EMPTY BOWEL, SPOKE WITH PATIENT AND HE IS AWARE OF THIS APPT. AND THE INSTRUCTIONS

## 2021-06-29 ENCOUNTER — Other Ambulatory Visit: Payer: Self-pay

## 2021-06-29 ENCOUNTER — Ambulatory Visit
Admission: RE | Admit: 2021-06-29 | Discharge: 2021-06-29 | Disposition: A | Discharge status: Home / Self Care | Payer: Medicare HMO | Source: Ambulatory Visit | Attending: Radiation Oncology | Admitting: Radiation Oncology

## 2021-06-29 DIAGNOSIS — C61 Malignant neoplasm of prostate: Secondary | ICD-10-CM | POA: Insufficient documentation

## 2021-06-29 DIAGNOSIS — Z191 Hormone sensitive malignancy status: Secondary | ICD-10-CM | POA: Diagnosis not present

## 2021-06-29 NOTE — Progress Notes (Signed)
  Radiation Oncology         [phone] ________________________________  Name: Mario [name] MRN: [id number]  Date: 06/29/2021  DOB: 04-Feb-1941  SIMULATION AND TREATMENT PLANNING NOTE    ICD-10-CM   1. Prostate cancer [facility])  C61       DIAGNOSIS:  81 y.o. gentleman with stage T1c adenocarcinoma of the prostate with a Gleason's score of 3+4 and a PSA of 11.1  NARRATIVE:  The patient was brought to the [facility].  Identity was confirmed.  All relevant records and images related to the planned course of therapy were reviewed.  The patient freely provided informed written consent to proceed with treatment after reviewing the details related to the planned course of therapy. The consent form was witnessed and verified by the simulation staff.  Then, the patient was set-up in a stable reproducible supine position for radiation therapy.  A vacuum lock pillow device was custom fabricated to position his legs in a reproducible immobilized position.  Then, I performed a urethrogram under sterile conditions to identify the prostatic apex.  CT images were obtained.  Surface markings were placed.  The CT images were loaded into the planning software.  Then the prostate target and avoidance structures including the rectum, bladder, bowel and hips were contoured.  Treatment planning then occurred.  The radiation prescription was entered and confirmed.  A total of one complex treatment devices was fabricated. I have requested : Intensity Modulated Radiotherapy (IMRT) is medically necessary for this case for the following reason:  Rectal sparing.[name]  PLAN:  The patient will receive 70 Gy in 28 fractions.  ________________________________  Sheral Apley Tammi Klippel, M.D.

## 2021-07-03 DIAGNOSIS — C61 Malignant neoplasm of prostate: Secondary | ICD-10-CM | POA: Diagnosis not present

## 2021-07-03 DIAGNOSIS — Z191 Hormone sensitive malignancy status: Secondary | ICD-10-CM | POA: Diagnosis not present

## 2021-07-04 DIAGNOSIS — C61 Malignant neoplasm of prostate: Secondary | ICD-10-CM | POA: Diagnosis not present

## 2021-07-04 DIAGNOSIS — Z191 Hormone sensitive malignancy status: Secondary | ICD-10-CM | POA: Diagnosis not present

## 2021-07-04 DIAGNOSIS — G4733 Obstructive sleep apnea (adult) (pediatric): Secondary | ICD-10-CM | POA: Diagnosis not present

## 2021-07-16 ENCOUNTER — Other Ambulatory Visit: Payer: Self-pay

## 2021-07-16 ENCOUNTER — Ambulatory Visit
Admission: RE | Admit: 2021-07-16 | Discharge: 2021-07-16 | Disposition: A | Discharge status: Home / Self Care | Payer: Medicare HMO | Source: Ambulatory Visit | Attending: Radiation Oncology | Admitting: Radiation Oncology

## 2021-07-16 DIAGNOSIS — Z510 Encounter for antineoplastic radiation therapy: Secondary | ICD-10-CM | POA: Diagnosis not present

## 2021-07-16 DIAGNOSIS — R300 Dysuria: Secondary | ICD-10-CM | POA: Insufficient documentation

## 2021-07-16 DIAGNOSIS — C61 Malignant neoplasm of prostate: Secondary | ICD-10-CM | POA: Diagnosis not present

## 2021-07-16 DIAGNOSIS — Z191 Hormone sensitive malignancy status: Secondary | ICD-10-CM | POA: Diagnosis not present

## 2021-07-16 LAB — RAD ONC ARIA SESSION SUMMARY
Course Elapsed Days: 0
Plan Fractions Treated to Date: 1
Plan Prescribed Dose Per Fraction: 2.5 Gy
Plan Total Fractions Prescribed: 28
Plan Total Prescribed Dose: 70 Gy
Reference Point Dosage Given to Date: 2.5 Gy
Reference Point Session Dosage Given: 2.5 Gy
Session Number: 1

## 2021-07-17 ENCOUNTER — Ambulatory Visit
Admission: RE | Admit: 2021-07-17 | Discharge: 2021-07-17 | Disposition: A | Discharge status: Home / Self Care | Payer: Medicare HMO | Source: Ambulatory Visit | Attending: Radiation Oncology | Admitting: Radiation Oncology

## 2021-07-17 ENCOUNTER — Other Ambulatory Visit: Payer: Self-pay

## 2021-07-17 DIAGNOSIS — Z510 Encounter for antineoplastic radiation therapy: Secondary | ICD-10-CM | POA: Diagnosis not present

## 2021-07-17 DIAGNOSIS — C61 Malignant neoplasm of prostate: Secondary | ICD-10-CM | POA: Diagnosis not present

## 2021-07-17 DIAGNOSIS — R300 Dysuria: Secondary | ICD-10-CM | POA: Diagnosis not present

## 2021-07-17 DIAGNOSIS — Z191 Hormone sensitive malignancy status: Secondary | ICD-10-CM | POA: Diagnosis not present

## 2021-07-17 LAB — RAD ONC ARIA SESSION SUMMARY
Course Elapsed Days: 1
Plan Fractions Treated to Date: 2
Plan Prescribed Dose Per Fraction: 2.5 Gy
Plan Total Fractions Prescribed: 28
Plan Total Prescribed Dose: 70 Gy
Reference Point Dosage Given to Date: 5 Gy
Reference Point Session Dosage Given: 2.5 Gy
Session Number: 2

## 2021-07-18 ENCOUNTER — Other Ambulatory Visit: Payer: Self-pay

## 2021-07-18 ENCOUNTER — Ambulatory Visit
Admission: RE | Admit: 2021-07-18 | Discharge: 2021-07-18 | Disposition: A | Discharge status: Home / Self Care | Payer: Medicare HMO | Source: Ambulatory Visit | Attending: Radiation Oncology | Admitting: Radiation Oncology

## 2021-07-18 DIAGNOSIS — Z510 Encounter for antineoplastic radiation therapy: Secondary | ICD-10-CM | POA: Diagnosis not present

## 2021-07-18 DIAGNOSIS — Z191 Hormone sensitive malignancy status: Secondary | ICD-10-CM | POA: Diagnosis not present

## 2021-07-18 DIAGNOSIS — R300 Dysuria: Secondary | ICD-10-CM | POA: Diagnosis not present

## 2021-07-18 DIAGNOSIS — C61 Malignant neoplasm of prostate: Secondary | ICD-10-CM | POA: Diagnosis not present

## 2021-07-18 LAB — RAD ONC ARIA SESSION SUMMARY
Course Elapsed Days: 2
Plan Fractions Treated to Date: 3
Plan Prescribed Dose Per Fraction: 2.5 Gy
Plan Total Fractions Prescribed: 28
Plan Total Prescribed Dose: 70 Gy
Reference Point Dosage Given to Date: 7.5 Gy
Reference Point Session Dosage Given: 2.5 Gy
Session Number: 3

## 2021-07-19 ENCOUNTER — Ambulatory Visit
Admission: RE | Admit: 2021-07-19 | Discharge: 2021-07-19 | Disposition: A | Discharge status: Home / Self Care | Payer: Medicare HMO | Source: Ambulatory Visit | Attending: Radiation Oncology | Admitting: Radiation Oncology

## 2021-07-19 ENCOUNTER — Other Ambulatory Visit: Payer: Self-pay

## 2021-07-19 DIAGNOSIS — C61 Malignant neoplasm of prostate: Secondary | ICD-10-CM | POA: Diagnosis not present

## 2021-07-19 DIAGNOSIS — R300 Dysuria: Secondary | ICD-10-CM | POA: Diagnosis not present

## 2021-07-19 DIAGNOSIS — Z191 Hormone sensitive malignancy status: Secondary | ICD-10-CM | POA: Diagnosis not present

## 2021-07-19 DIAGNOSIS — Z510 Encounter for antineoplastic radiation therapy: Secondary | ICD-10-CM | POA: Diagnosis not present

## 2021-07-19 LAB — RAD ONC ARIA SESSION SUMMARY
Course Elapsed Days: 3
Plan Fractions Treated to Date: 4
Plan Prescribed Dose Per Fraction: 2.5 Gy
Plan Total Fractions Prescribed: 28
Plan Total Prescribed Dose: 70 Gy
Reference Point Dosage Given to Date: 10 Gy
Reference Point Session Dosage Given: 2.5 Gy
Session Number: 4

## 2021-07-20 ENCOUNTER — Other Ambulatory Visit: Payer: Self-pay

## 2021-07-20 ENCOUNTER — Ambulatory Visit
Admission: RE | Admit: 2021-07-20 | Discharge: 2021-07-20 | Disposition: A | Discharge status: Home / Self Care | Payer: Medicare HMO | Source: Ambulatory Visit | Attending: Radiation Oncology | Admitting: Radiation Oncology

## 2021-07-20 DIAGNOSIS — Z191 Hormone sensitive malignancy status: Secondary | ICD-10-CM | POA: Diagnosis not present

## 2021-07-20 DIAGNOSIS — C61 Malignant neoplasm of prostate: Secondary | ICD-10-CM | POA: Diagnosis not present

## 2021-07-20 DIAGNOSIS — Z510 Encounter for antineoplastic radiation therapy: Secondary | ICD-10-CM | POA: Diagnosis not present

## 2021-07-20 DIAGNOSIS — R300 Dysuria: Secondary | ICD-10-CM | POA: Diagnosis not present

## 2021-07-20 LAB — RAD ONC ARIA SESSION SUMMARY
Course Elapsed Days: 4
Plan Fractions Treated to Date: 5
Plan Prescribed Dose Per Fraction: 2.5 Gy
Plan Total Fractions Prescribed: 28
Plan Total Prescribed Dose: 70 Gy
Reference Point Dosage Given to Date: 12.5 Gy
Reference Point Session Dosage Given: 2.5 Gy
Session Number: 5

## 2021-07-23 ENCOUNTER — Other Ambulatory Visit: Payer: Self-pay

## 2021-07-23 ENCOUNTER — Ambulatory Visit
Admission: RE | Admit: 2021-07-23 | Discharge: 2021-07-23 | Disposition: A | Discharge status: Home / Self Care | Payer: Medicare HMO | Source: Ambulatory Visit | Attending: Radiation Oncology | Admitting: Radiation Oncology

## 2021-07-23 DIAGNOSIS — C61 Malignant neoplasm of prostate: Secondary | ICD-10-CM | POA: Diagnosis not present

## 2021-07-23 DIAGNOSIS — R300 Dysuria: Secondary | ICD-10-CM | POA: Diagnosis not present

## 2021-07-23 DIAGNOSIS — Z191 Hormone sensitive malignancy status: Secondary | ICD-10-CM | POA: Diagnosis not present

## 2021-07-23 DIAGNOSIS — Z510 Encounter for antineoplastic radiation therapy: Secondary | ICD-10-CM | POA: Diagnosis not present

## 2021-07-23 LAB — RAD ONC ARIA SESSION SUMMARY
Course Elapsed Days: 7
Plan Fractions Treated to Date: 6
Plan Prescribed Dose Per Fraction: 2.5 Gy
Plan Total Fractions Prescribed: 28
Plan Total Prescribed Dose: 70 Gy
Reference Point Dosage Given to Date: 15 Gy
Reference Point Session Dosage Given: 2.5 Gy
Session Number: 6

## 2021-07-24 ENCOUNTER — Other Ambulatory Visit: Payer: Self-pay

## 2021-07-24 ENCOUNTER — Ambulatory Visit
Admission: RE | Admit: 2021-07-24 | Discharge: 2021-07-24 | Disposition: A | Discharge status: Home / Self Care | Payer: Medicare HMO | Source: Ambulatory Visit | Attending: Radiation Oncology | Admitting: Radiation Oncology

## 2021-07-24 DIAGNOSIS — L570 Actinic keratosis: Secondary | ICD-10-CM | POA: Diagnosis not present

## 2021-07-24 DIAGNOSIS — C44619 Basal cell carcinoma of skin of left upper limb, including shoulder: Secondary | ICD-10-CM | POA: Diagnosis not present

## 2021-07-24 DIAGNOSIS — Z85828 Personal history of other malignant neoplasm of skin: Secondary | ICD-10-CM | POA: Diagnosis not present

## 2021-07-24 DIAGNOSIS — D485 Neoplasm of uncertain behavior of skin: Secondary | ICD-10-CM | POA: Diagnosis not present

## 2021-07-24 DIAGNOSIS — D1801 Hemangioma of skin and subcutaneous tissue: Secondary | ICD-10-CM | POA: Diagnosis not present

## 2021-07-24 DIAGNOSIS — Z85820 Personal history of malignant melanoma of skin: Secondary | ICD-10-CM | POA: Diagnosis not present

## 2021-07-24 DIAGNOSIS — L723 Sebaceous cyst: Secondary | ICD-10-CM | POA: Diagnosis not present

## 2021-07-24 DIAGNOSIS — R300 Dysuria: Secondary | ICD-10-CM | POA: Diagnosis not present

## 2021-07-24 DIAGNOSIS — L821 Other seborrheic keratosis: Secondary | ICD-10-CM | POA: Diagnosis not present

## 2021-07-24 DIAGNOSIS — Z510 Encounter for antineoplastic radiation therapy: Secondary | ICD-10-CM | POA: Diagnosis not present

## 2021-07-24 DIAGNOSIS — Z191 Hormone sensitive malignancy status: Secondary | ICD-10-CM | POA: Diagnosis not present

## 2021-07-24 DIAGNOSIS — C61 Malignant neoplasm of prostate: Secondary | ICD-10-CM | POA: Diagnosis not present

## 2021-07-24 LAB — RAD ONC ARIA SESSION SUMMARY
Course Elapsed Days: 8
Plan Fractions Treated to Date: 7
Plan Prescribed Dose Per Fraction: 2.5 Gy
Plan Total Fractions Prescribed: 28
Plan Total Prescribed Dose: 70 Gy
Reference Point Dosage Given to Date: 17.5 Gy
Reference Point Session Dosage Given: 2.5 Gy
Session Number: 7

## 2021-07-25 ENCOUNTER — Other Ambulatory Visit: Payer: Self-pay

## 2021-07-25 ENCOUNTER — Ambulatory Visit
Admission: RE | Admit: 2021-07-25 | Discharge: 2021-07-25 | Disposition: A | Discharge status: Home / Self Care | Payer: Medicare HMO | Source: Ambulatory Visit | Attending: Radiation Oncology | Admitting: Radiation Oncology

## 2021-07-25 DIAGNOSIS — R300 Dysuria: Secondary | ICD-10-CM | POA: Diagnosis not present

## 2021-07-25 DIAGNOSIS — C61 Malignant neoplasm of prostate: Secondary | ICD-10-CM | POA: Diagnosis not present

## 2021-07-25 DIAGNOSIS — Z510 Encounter for antineoplastic radiation therapy: Secondary | ICD-10-CM | POA: Diagnosis not present

## 2021-07-25 DIAGNOSIS — Z191 Hormone sensitive malignancy status: Secondary | ICD-10-CM | POA: Diagnosis not present

## 2021-07-25 LAB — RAD ONC ARIA SESSION SUMMARY
Course Elapsed Days: 9
Plan Fractions Treated to Date: 8
Plan Prescribed Dose Per Fraction: 2.5 Gy
Plan Total Fractions Prescribed: 28
Plan Total Prescribed Dose: 70 Gy
Reference Point Dosage Given to Date: 20 Gy
Reference Point Session Dosage Given: 2.5 Gy
Session Number: 8

## 2021-07-26 ENCOUNTER — Other Ambulatory Visit: Payer: Self-pay

## 2021-07-26 ENCOUNTER — Ambulatory Visit
Admission: RE | Admit: 2021-07-26 | Discharge: 2021-07-26 | Disposition: A | Discharge status: Home / Self Care | Payer: Medicare HMO | Source: Ambulatory Visit | Attending: Radiation Oncology | Admitting: Radiation Oncology

## 2021-07-26 DIAGNOSIS — Z191 Hormone sensitive malignancy status: Secondary | ICD-10-CM | POA: Diagnosis not present

## 2021-07-26 DIAGNOSIS — R300 Dysuria: Secondary | ICD-10-CM | POA: Diagnosis not present

## 2021-07-26 DIAGNOSIS — Z510 Encounter for antineoplastic radiation therapy: Secondary | ICD-10-CM | POA: Diagnosis not present

## 2021-07-26 DIAGNOSIS — C61 Malignant neoplasm of prostate: Secondary | ICD-10-CM | POA: Diagnosis not present

## 2021-07-26 LAB — RAD ONC ARIA SESSION SUMMARY
Course Elapsed Days: 10
Plan Fractions Treated to Date: 9
Plan Prescribed Dose Per Fraction: 2.5 Gy
Plan Total Fractions Prescribed: 28
Plan Total Prescribed Dose: 70 Gy
Reference Point Dosage Given to Date: 22.5 Gy
Reference Point Session Dosage Given: 2.5 Gy
Session Number: 9

## 2021-07-27 ENCOUNTER — Ambulatory Visit
Admission: RE | Admit: 2021-07-27 | Discharge: 2021-07-27 | Disposition: A | Discharge status: Home / Self Care | Payer: Medicare HMO | Source: Ambulatory Visit | Attending: Radiation Oncology | Admitting: Radiation Oncology

## 2021-07-27 ENCOUNTER — Other Ambulatory Visit: Payer: Self-pay

## 2021-07-27 DIAGNOSIS — Z191 Hormone sensitive malignancy status: Secondary | ICD-10-CM | POA: Diagnosis not present

## 2021-07-27 DIAGNOSIS — Z510 Encounter for antineoplastic radiation therapy: Secondary | ICD-10-CM | POA: Diagnosis not present

## 2021-07-27 DIAGNOSIS — R300 Dysuria: Secondary | ICD-10-CM | POA: Diagnosis not present

## 2021-07-27 DIAGNOSIS — C61 Malignant neoplasm of prostate: Secondary | ICD-10-CM | POA: Diagnosis not present

## 2021-07-27 LAB — RAD ONC ARIA SESSION SUMMARY
Course Elapsed Days: 11
Plan Fractions Treated to Date: 10
Plan Prescribed Dose Per Fraction: 2.5 Gy
Plan Total Fractions Prescribed: 28
Plan Total Prescribed Dose: 70 Gy
Reference Point Dosage Given to Date: 25 Gy
Reference Point Session Dosage Given: 2.5 Gy
Session Number: 10

## 2021-07-30 ENCOUNTER — Other Ambulatory Visit: Payer: Self-pay

## 2021-07-30 ENCOUNTER — Ambulatory Visit
Admission: RE | Admit: 2021-07-30 | Discharge: 2021-07-30 | Disposition: A | Discharge status: Home / Self Care | Payer: Medicare HMO | Source: Ambulatory Visit | Attending: Radiation Oncology | Admitting: Radiation Oncology

## 2021-07-30 DIAGNOSIS — Z191 Hormone sensitive malignancy status: Secondary | ICD-10-CM | POA: Diagnosis not present

## 2021-07-30 DIAGNOSIS — C61 Malignant neoplasm of prostate: Secondary | ICD-10-CM | POA: Diagnosis not present

## 2021-07-30 DIAGNOSIS — Z510 Encounter for antineoplastic radiation therapy: Secondary | ICD-10-CM | POA: Diagnosis not present

## 2021-07-30 DIAGNOSIS — R300 Dysuria: Secondary | ICD-10-CM | POA: Diagnosis not present

## 2021-07-30 LAB — RAD ONC ARIA SESSION SUMMARY
Course Elapsed Days: 14
Plan Fractions Treated to Date: 11
Plan Prescribed Dose Per Fraction: 2.5 Gy
Plan Total Fractions Prescribed: 28
Plan Total Prescribed Dose: 70 Gy
Reference Point Dosage Given to Date: 27.5 Gy
Reference Point Session Dosage Given: 2.5 Gy
Session Number: 11

## 2021-07-31 ENCOUNTER — Other Ambulatory Visit: Payer: Self-pay

## 2021-07-31 ENCOUNTER — Ambulatory Visit
Admission: RE | Admit: 2021-07-31 | Discharge: 2021-07-31 | Disposition: A | Discharge status: Home / Self Care | Payer: Medicare HMO | Source: Ambulatory Visit | Attending: Radiation Oncology | Admitting: Radiation Oncology

## 2021-07-31 DIAGNOSIS — Z510 Encounter for antineoplastic radiation therapy: Secondary | ICD-10-CM | POA: Diagnosis not present

## 2021-07-31 DIAGNOSIS — R300 Dysuria: Secondary | ICD-10-CM | POA: Diagnosis not present

## 2021-07-31 DIAGNOSIS — C61 Malignant neoplasm of prostate: Secondary | ICD-10-CM | POA: Diagnosis not present

## 2021-07-31 DIAGNOSIS — Z191 Hormone sensitive malignancy status: Secondary | ICD-10-CM | POA: Diagnosis not present

## 2021-07-31 LAB — RAD ONC ARIA SESSION SUMMARY
Course Elapsed Days: 15
Plan Fractions Treated to Date: 12
Plan Prescribed Dose Per Fraction: 2.5 Gy
Plan Total Fractions Prescribed: 28
Plan Total Prescribed Dose: 70 Gy
Reference Point Dosage Given to Date: 30 Gy
Reference Point Session Dosage Given: 2.5 Gy
Session Number: 12

## 2021-08-01 ENCOUNTER — Other Ambulatory Visit: Payer: Self-pay

## 2021-08-01 ENCOUNTER — Ambulatory Visit
Admission: RE | Admit: 2021-08-01 | Discharge: 2021-08-01 | Disposition: A | Discharge status: Home / Self Care | Payer: Medicare HMO | Source: Ambulatory Visit | Attending: Radiation Oncology | Admitting: Radiation Oncology

## 2021-08-01 DIAGNOSIS — C61 Malignant neoplasm of prostate: Secondary | ICD-10-CM | POA: Diagnosis not present

## 2021-08-01 DIAGNOSIS — R300 Dysuria: Secondary | ICD-10-CM | POA: Diagnosis not present

## 2021-08-01 DIAGNOSIS — Z191 Hormone sensitive malignancy status: Secondary | ICD-10-CM | POA: Diagnosis not present

## 2021-08-01 DIAGNOSIS — Z510 Encounter for antineoplastic radiation therapy: Secondary | ICD-10-CM | POA: Diagnosis not present

## 2021-08-01 LAB — RAD ONC ARIA SESSION SUMMARY
Course Elapsed Days: 16
Plan Fractions Treated to Date: 13
Plan Prescribed Dose Per Fraction: 2.5 Gy
Plan Total Fractions Prescribed: 28
Plan Total Prescribed Dose: 70 Gy
Reference Point Dosage Given to Date: 32.5 Gy
Reference Point Session Dosage Given: 2.5 Gy
Session Number: 13

## 2021-08-02 ENCOUNTER — Ambulatory Visit
Admission: RE | Admit: 2021-08-02 | Discharge: 2021-08-02 | Disposition: A | Discharge status: Home / Self Care | Payer: Medicare HMO | Source: Ambulatory Visit | Attending: Radiation Oncology | Admitting: Radiation Oncology

## 2021-08-02 ENCOUNTER — Other Ambulatory Visit: Payer: Self-pay

## 2021-08-02 DIAGNOSIS — Z510 Encounter for antineoplastic radiation therapy: Secondary | ICD-10-CM | POA: Diagnosis not present

## 2021-08-02 DIAGNOSIS — R300 Dysuria: Secondary | ICD-10-CM | POA: Diagnosis not present

## 2021-08-02 DIAGNOSIS — Z191 Hormone sensitive malignancy status: Secondary | ICD-10-CM | POA: Diagnosis not present

## 2021-08-02 DIAGNOSIS — C61 Malignant neoplasm of prostate: Secondary | ICD-10-CM | POA: Diagnosis not present

## 2021-08-02 LAB — RAD ONC ARIA SESSION SUMMARY
Course Elapsed Days: 17
Plan Fractions Treated to Date: 14
Plan Prescribed Dose Per Fraction: 2.5 Gy
Plan Total Fractions Prescribed: 28
Plan Total Prescribed Dose: 70 Gy
Reference Point Dosage Given to Date: 35 Gy
Reference Point Session Dosage Given: 2.5 Gy
Session Number: 14

## 2021-08-03 ENCOUNTER — Other Ambulatory Visit: Payer: Self-pay

## 2021-08-03 ENCOUNTER — Ambulatory Visit
Admission: RE | Admit: 2021-08-03 | Discharge: 2021-08-03 | Disposition: A | Discharge status: Home / Self Care | Payer: Medicare HMO | Source: Ambulatory Visit | Attending: Radiation Oncology | Admitting: Radiation Oncology

## 2021-08-03 DIAGNOSIS — C61 Malignant neoplasm of prostate: Secondary | ICD-10-CM | POA: Diagnosis not present

## 2021-08-03 DIAGNOSIS — R300 Dysuria: Secondary | ICD-10-CM | POA: Diagnosis not present

## 2021-08-03 DIAGNOSIS — Z191 Hormone sensitive malignancy status: Secondary | ICD-10-CM | POA: Diagnosis not present

## 2021-08-03 DIAGNOSIS — Z510 Encounter for antineoplastic radiation therapy: Secondary | ICD-10-CM | POA: Diagnosis not present

## 2021-08-03 LAB — RAD ONC ARIA SESSION SUMMARY
Course Elapsed Days: 18
Plan Fractions Treated to Date: 15
Plan Prescribed Dose Per Fraction: 2.5 Gy
Plan Total Fractions Prescribed: 28
Plan Total Prescribed Dose: 70 Gy
Reference Point Dosage Given to Date: 37.5 Gy
Reference Point Session Dosage Given: 2.5 Gy
Session Number: 15

## 2021-08-04 DIAGNOSIS — G4733 Obstructive sleep apnea (adult) (pediatric): Secondary | ICD-10-CM | POA: Diagnosis not present

## 2021-08-06 ENCOUNTER — Other Ambulatory Visit: Payer: Self-pay

## 2021-08-06 ENCOUNTER — Ambulatory Visit
Admission: RE | Admit: 2021-08-06 | Discharge: 2021-08-06 | Disposition: A | Discharge status: Home / Self Care | Payer: Medicare HMO | Source: Ambulatory Visit | Attending: Radiation Oncology | Admitting: Radiation Oncology

## 2021-08-06 DIAGNOSIS — R300 Dysuria: Secondary | ICD-10-CM | POA: Diagnosis not present

## 2021-08-06 DIAGNOSIS — Z510 Encounter for antineoplastic radiation therapy: Secondary | ICD-10-CM | POA: Diagnosis not present

## 2021-08-06 DIAGNOSIS — Z191 Hormone sensitive malignancy status: Secondary | ICD-10-CM | POA: Diagnosis not present

## 2021-08-06 DIAGNOSIS — C61 Malignant neoplasm of prostate: Secondary | ICD-10-CM | POA: Diagnosis not present

## 2021-08-06 LAB — RAD ONC ARIA SESSION SUMMARY
Course Elapsed Days: 21
Plan Fractions Treated to Date: 16
Plan Prescribed Dose Per Fraction: 2.5 Gy
Plan Total Fractions Prescribed: 28
Plan Total Prescribed Dose: 70 Gy
Reference Point Dosage Given to Date: 40 Gy
Reference Point Session Dosage Given: 2.5 Gy
Session Number: 16

## 2021-08-07 ENCOUNTER — Ambulatory Visit
Admission: RE | Admit: 2021-08-07 | Discharge: 2021-08-07 | Disposition: A | Discharge status: Home / Self Care | Payer: Medicare HMO | Source: Ambulatory Visit | Attending: Radiation Oncology | Admitting: Radiation Oncology

## 2021-08-07 ENCOUNTER — Other Ambulatory Visit: Payer: Self-pay

## 2021-08-07 DIAGNOSIS — C61 Malignant neoplasm of prostate: Secondary | ICD-10-CM | POA: Diagnosis not present

## 2021-08-07 DIAGNOSIS — Z191 Hormone sensitive malignancy status: Secondary | ICD-10-CM | POA: Diagnosis not present

## 2021-08-07 DIAGNOSIS — Z510 Encounter for antineoplastic radiation therapy: Secondary | ICD-10-CM | POA: Diagnosis not present

## 2021-08-07 DIAGNOSIS — R300 Dysuria: Secondary | ICD-10-CM | POA: Diagnosis not present

## 2021-08-07 LAB — RAD ONC ARIA SESSION SUMMARY
Course Elapsed Days: 22
Plan Fractions Treated to Date: 17
Plan Prescribed Dose Per Fraction: 2.5 Gy
Plan Total Fractions Prescribed: 28
Plan Total Prescribed Dose: 70 Gy
Reference Point Dosage Given to Date: 42.5 Gy
Reference Point Session Dosage Given: 2.5 Gy
Session Number: 17

## 2021-08-08 ENCOUNTER — Other Ambulatory Visit: Payer: Self-pay

## 2021-08-08 ENCOUNTER — Ambulatory Visit: Payer: Medicare HMO

## 2021-08-08 ENCOUNTER — Ambulatory Visit
Admission: RE | Admit: 2021-08-08 | Discharge: 2021-08-08 | Disposition: A | Discharge status: Home / Self Care | Payer: Medicare HMO | Source: Ambulatory Visit | Attending: Radiation Oncology | Admitting: Radiation Oncology

## 2021-08-08 DIAGNOSIS — C61 Malignant neoplasm of prostate: Secondary | ICD-10-CM

## 2021-08-08 DIAGNOSIS — Z510 Encounter for antineoplastic radiation therapy: Secondary | ICD-10-CM | POA: Diagnosis not present

## 2021-08-08 DIAGNOSIS — R300 Dysuria: Secondary | ICD-10-CM | POA: Diagnosis not present

## 2021-08-08 DIAGNOSIS — Z191 Hormone sensitive malignancy status: Secondary | ICD-10-CM | POA: Diagnosis not present

## 2021-08-08 LAB — RAD ONC ARIA SESSION SUMMARY
Course Elapsed Days: 23
Plan Fractions Treated to Date: 18
Plan Prescribed Dose Per Fraction: 2.5 Gy
Plan Total Fractions Prescribed: 28
Plan Total Prescribed Dose: 70 Gy
Reference Point Dosage Given to Date: 45 Gy
Reference Point Session Dosage Given: 2.5 Gy
Session Number: 18

## 2021-08-08 LAB — URINALYSIS, COMPLETE (UACMP) WITH MICROSCOPIC
Bilirubin Urine: NEGATIVE
Glucose, UA: NEGATIVE mg/dL
Ketones, ur: NEGATIVE mg/dL
Leukocytes,Ua: NEGATIVE
Nitrite: NEGATIVE
Protein, ur: NEGATIVE mg/dL
Specific Gravity, Urine: 1.005 (ref 1.005–1.030)
pH: 6 (ref 5.0–8.0)

## 2021-08-08 NOTE — Progress Notes (Signed)
Patient sent over to nursing with complaints of urinary symptoms burning with urination, nocturia x 10, hesitancy, and push/strain to start urination.  Denies hematuria at this time.  Patient reports taking AZO as directed for several days helped briefly but not at this time.  "I felt like I was going to pass out the pain was severe when peeing".  Orders per Dr. Lisbeth Renshaw for UA, C&S at this time.  Collected urine sample and took to lab for testing.  Patient was encouraged to increase water intake and continue AZO.  Will call him once results are in and if any new orders.  Nothing further patient left the clinic.

## 2021-08-09 ENCOUNTER — Other Ambulatory Visit: Payer: Self-pay

## 2021-08-09 ENCOUNTER — Ambulatory Visit
Admission: RE | Admit: 2021-08-09 | Discharge: 2021-08-09 | Disposition: A | Discharge status: Home / Self Care | Payer: Medicare HMO | Source: Ambulatory Visit | Attending: Radiation Oncology | Admitting: Radiation Oncology

## 2021-08-09 ENCOUNTER — Other Ambulatory Visit: Payer: Self-pay | Admitting: Radiation Oncology

## 2021-08-09 ENCOUNTER — Telehealth: Payer: Self-pay

## 2021-08-09 DIAGNOSIS — Z191 Hormone sensitive malignancy status: Secondary | ICD-10-CM | POA: Diagnosis not present

## 2021-08-09 DIAGNOSIS — Z510 Encounter for antineoplastic radiation therapy: Secondary | ICD-10-CM | POA: Diagnosis not present

## 2021-08-09 DIAGNOSIS — C61 Malignant neoplasm of prostate: Secondary | ICD-10-CM | POA: Diagnosis not present

## 2021-08-09 DIAGNOSIS — R300 Dysuria: Secondary | ICD-10-CM | POA: Diagnosis not present

## 2021-08-09 LAB — URINE CULTURE: Culture: <10000 — AB

## 2021-08-09 LAB — RAD ONC ARIA SESSION SUMMARY
Course Elapsed Days: 24
Plan Fractions Treated to Date: 19
Plan Prescribed Dose Per Fraction: 2.5 Gy
Plan Total Fractions Prescribed: 28
Plan Total Prescribed Dose: 70 Gy
Reference Point Dosage Given to Date: 47.5 Gy
Reference Point Session Dosage Given: 2.5 Gy
Session Number: 19

## 2021-08-09 MED ORDER — CIPROFLOXACIN HCL 500 MG PO TABS: 500.0000 mg | ORAL_TABLET | Freq: Two times a day (BID) | ORAL | 0 refills | Status: DC

## 2021-08-09 NOTE — Telephone Encounter (Signed)
RN called patient verified identity and informed him of prescription that was sent into pharmacy for him to pick up.

## 2021-08-10 ENCOUNTER — Other Ambulatory Visit: Payer: Self-pay

## 2021-08-10 ENCOUNTER — Ambulatory Visit
Admission: RE | Admit: 2021-08-10 | Discharge: 2021-08-10 | Disposition: A | Discharge status: Home / Self Care | Payer: Medicare HMO | Source: Ambulatory Visit | Attending: Radiation Oncology | Admitting: Radiation Oncology

## 2021-08-10 DIAGNOSIS — C61 Malignant neoplasm of prostate: Secondary | ICD-10-CM | POA: Diagnosis not present

## 2021-08-10 DIAGNOSIS — Z510 Encounter for antineoplastic radiation therapy: Secondary | ICD-10-CM | POA: Diagnosis not present

## 2021-08-10 DIAGNOSIS — Z191 Hormone sensitive malignancy status: Secondary | ICD-10-CM | POA: Diagnosis not present

## 2021-08-10 DIAGNOSIS — R300 Dysuria: Secondary | ICD-10-CM | POA: Diagnosis not present

## 2021-08-10 LAB — RAD ONC ARIA SESSION SUMMARY
Course Elapsed Days: 25
Plan Fractions Treated to Date: 20
Plan Prescribed Dose Per Fraction: 2.5 Gy
Plan Total Fractions Prescribed: 28
Plan Total Prescribed Dose: 70 Gy
Reference Point Dosage Given to Date: 50 Gy
Reference Point Session Dosage Given: 2.5 Gy
Session Number: 20

## 2021-08-13 ENCOUNTER — Ambulatory Visit
Admission: RE | Admit: 2021-08-13 | Discharge: 2021-08-13 | Disposition: A | Discharge status: Home / Self Care | Payer: Medicare HMO | Source: Ambulatory Visit | Attending: Radiation Oncology | Admitting: Radiation Oncology

## 2021-08-13 ENCOUNTER — Other Ambulatory Visit: Payer: Self-pay

## 2021-08-13 DIAGNOSIS — Z510 Encounter for antineoplastic radiation therapy: Secondary | ICD-10-CM | POA: Diagnosis not present

## 2021-08-13 DIAGNOSIS — C61 Malignant neoplasm of prostate: Secondary | ICD-10-CM | POA: Diagnosis not present

## 2021-08-13 DIAGNOSIS — Z191 Hormone sensitive malignancy status: Secondary | ICD-10-CM | POA: Diagnosis not present

## 2021-08-13 LAB — RAD ONC ARIA SESSION SUMMARY
Course Elapsed Days: 28
Plan Fractions Treated to Date: 21
Plan Prescribed Dose Per Fraction: 2.5 Gy
Plan Total Fractions Prescribed: 28
Plan Total Prescribed Dose: 70 Gy
Reference Point Dosage Given to Date: 52.5 Gy
Reference Point Session Dosage Given: 2.5 Gy
Session Number: 21

## 2021-08-15 ENCOUNTER — Ambulatory Visit
Admission: RE | Admit: 2021-08-15 | Discharge: 2021-08-15 | Disposition: A | Discharge status: Home / Self Care | Payer: Medicare HMO | Source: Ambulatory Visit | Attending: Radiation Oncology | Admitting: Radiation Oncology

## 2021-08-15 ENCOUNTER — Other Ambulatory Visit: Payer: Self-pay

## 2021-08-15 DIAGNOSIS — C61 Malignant neoplasm of prostate: Secondary | ICD-10-CM | POA: Diagnosis not present

## 2021-08-15 DIAGNOSIS — Z191 Hormone sensitive malignancy status: Secondary | ICD-10-CM | POA: Diagnosis not present

## 2021-08-15 DIAGNOSIS — Z510 Encounter for antineoplastic radiation therapy: Secondary | ICD-10-CM | POA: Diagnosis not present

## 2021-08-15 LAB — RAD ONC ARIA SESSION SUMMARY
Course Elapsed Days: 30
Plan Fractions Treated to Date: 22
Plan Prescribed Dose Per Fraction: 2.5 Gy
Plan Total Fractions Prescribed: 28
Plan Total Prescribed Dose: 70 Gy
Reference Point Dosage Given to Date: 55 Gy
Reference Point Session Dosage Given: 2.5 Gy
Session Number: 22

## 2021-08-16 ENCOUNTER — Other Ambulatory Visit: Payer: Self-pay

## 2021-08-16 ENCOUNTER — Ambulatory Visit
Admission: RE | Admit: 2021-08-16 | Discharge: 2021-08-16 | Disposition: A | Discharge status: Home / Self Care | Payer: Medicare HMO | Source: Ambulatory Visit | Attending: Radiation Oncology | Admitting: Radiation Oncology

## 2021-08-16 DIAGNOSIS — Z510 Encounter for antineoplastic radiation therapy: Secondary | ICD-10-CM | POA: Diagnosis not present

## 2021-08-16 DIAGNOSIS — C61 Malignant neoplasm of prostate: Secondary | ICD-10-CM | POA: Diagnosis not present

## 2021-08-16 DIAGNOSIS — Z191 Hormone sensitive malignancy status: Secondary | ICD-10-CM | POA: Diagnosis not present

## 2021-08-16 LAB — RAD ONC ARIA SESSION SUMMARY
Course Elapsed Days: 31
Plan Fractions Treated to Date: 23
Plan Prescribed Dose Per Fraction: 2.5 Gy
Plan Total Fractions Prescribed: 28
Plan Total Prescribed Dose: 70 Gy
Reference Point Dosage Given to Date: 57.5 Gy
Reference Point Session Dosage Given: 2.5 Gy
Session Number: 23

## 2021-08-17 ENCOUNTER — Other Ambulatory Visit: Payer: Self-pay

## 2021-08-17 ENCOUNTER — Ambulatory Visit
Admission: RE | Admit: 2021-08-17 | Discharge: 2021-08-17 | Disposition: A | Discharge status: Home / Self Care | Payer: Medicare HMO | Source: Ambulatory Visit | Attending: Radiation Oncology | Admitting: Radiation Oncology

## 2021-08-17 DIAGNOSIS — Z510 Encounter for antineoplastic radiation therapy: Secondary | ICD-10-CM | POA: Diagnosis not present

## 2021-08-17 DIAGNOSIS — C61 Malignant neoplasm of prostate: Secondary | ICD-10-CM | POA: Diagnosis not present

## 2021-08-17 DIAGNOSIS — Z191 Hormone sensitive malignancy status: Secondary | ICD-10-CM | POA: Diagnosis not present

## 2021-08-17 LAB — RAD ONC ARIA SESSION SUMMARY
Course Elapsed Days: 32
Plan Fractions Treated to Date: 24
Plan Prescribed Dose Per Fraction: 2.5 Gy
Plan Total Fractions Prescribed: 28
Plan Total Prescribed Dose: 70 Gy
Reference Point Dosage Given to Date: 60 Gy
Reference Point Session Dosage Given: 2.5 Gy
Session Number: 24

## 2021-08-20 ENCOUNTER — Ambulatory Visit
Admission: RE | Admit: 2021-08-20 | Discharge: 2021-08-20 | Disposition: A | Discharge status: Home / Self Care | Payer: Medicare HMO | Source: Ambulatory Visit | Attending: Radiation Oncology | Admitting: Radiation Oncology

## 2021-08-20 ENCOUNTER — Other Ambulatory Visit: Payer: Self-pay

## 2021-08-20 DIAGNOSIS — Z510 Encounter for antineoplastic radiation therapy: Secondary | ICD-10-CM | POA: Diagnosis not present

## 2021-08-20 DIAGNOSIS — Z191 Hormone sensitive malignancy status: Secondary | ICD-10-CM | POA: Diagnosis not present

## 2021-08-20 DIAGNOSIS — C61 Malignant neoplasm of prostate: Secondary | ICD-10-CM | POA: Diagnosis not present

## 2021-08-20 LAB — RAD ONC ARIA SESSION SUMMARY
Course Elapsed Days: 35
Plan Fractions Treated to Date: 25
Plan Prescribed Dose Per Fraction: 2.5 Gy
Plan Total Fractions Prescribed: 28
Plan Total Prescribed Dose: 70 Gy
Reference Point Dosage Given to Date: 62.5 Gy
Reference Point Session Dosage Given: 2.5 Gy
Session Number: 25

## 2021-08-21 ENCOUNTER — Other Ambulatory Visit: Payer: Self-pay

## 2021-08-21 ENCOUNTER — Ambulatory Visit
Admission: RE | Admit: 2021-08-21 | Discharge: 2021-08-21 | Disposition: A | Discharge status: Home / Self Care | Payer: Medicare HMO | Source: Ambulatory Visit | Attending: Radiation Oncology | Admitting: Radiation Oncology

## 2021-08-21 DIAGNOSIS — Z510 Encounter for antineoplastic radiation therapy: Secondary | ICD-10-CM | POA: Diagnosis not present

## 2021-08-21 DIAGNOSIS — C61 Malignant neoplasm of prostate: Secondary | ICD-10-CM | POA: Diagnosis not present

## 2021-08-21 DIAGNOSIS — Z191 Hormone sensitive malignancy status: Secondary | ICD-10-CM | POA: Diagnosis not present

## 2021-08-21 LAB — RAD ONC ARIA SESSION SUMMARY
Course Elapsed Days: 36
Plan Fractions Treated to Date: 26
Plan Prescribed Dose Per Fraction: 2.5 Gy
Plan Total Fractions Prescribed: 28
Plan Total Prescribed Dose: 70 Gy
Reference Point Dosage Given to Date: 65 Gy
Reference Point Session Dosage Given: 2.5 Gy
Session Number: 26

## 2021-08-22 ENCOUNTER — Ambulatory Visit
Admission: RE | Admit: 2021-08-22 | Discharge: 2021-08-22 | Disposition: A | Discharge status: Home / Self Care | Payer: Medicare HMO | Source: Ambulatory Visit | Attending: Radiation Oncology | Admitting: Radiation Oncology

## 2021-08-22 ENCOUNTER — Other Ambulatory Visit: Payer: Self-pay

## 2021-08-22 DIAGNOSIS — Z510 Encounter for antineoplastic radiation therapy: Secondary | ICD-10-CM | POA: Diagnosis not present

## 2021-08-22 DIAGNOSIS — C61 Malignant neoplasm of prostate: Secondary | ICD-10-CM | POA: Diagnosis not present

## 2021-08-22 DIAGNOSIS — Z191 Hormone sensitive malignancy status: Secondary | ICD-10-CM | POA: Diagnosis not present

## 2021-08-22 LAB — RAD ONC ARIA SESSION SUMMARY
Course Elapsed Days: 37
Plan Fractions Treated to Date: 27
Plan Prescribed Dose Per Fraction: 2.5 Gy
Plan Total Fractions Prescribed: 28
Plan Total Prescribed Dose: 70 Gy
Reference Point Dosage Given to Date: 67.5 Gy
Reference Point Session Dosage Given: 2.5 Gy
Session Number: 27

## 2021-08-23 ENCOUNTER — Other Ambulatory Visit: Payer: Self-pay

## 2021-08-23 ENCOUNTER — Ambulatory Visit
Admission: RE | Admit: 2021-08-23 | Discharge: 2021-08-23 | Disposition: A | Discharge status: Home / Self Care | Payer: Medicare HMO | Source: Ambulatory Visit | Attending: Radiation Oncology | Admitting: Radiation Oncology

## 2021-08-23 ENCOUNTER — Encounter: Payer: Self-pay | Admitting: Urology

## 2021-08-23 DIAGNOSIS — C61 Malignant neoplasm of prostate: Secondary | ICD-10-CM

## 2021-08-23 DIAGNOSIS — Z510 Encounter for antineoplastic radiation therapy: Secondary | ICD-10-CM | POA: Diagnosis not present

## 2021-08-23 DIAGNOSIS — Z191 Hormone sensitive malignancy status: Secondary | ICD-10-CM | POA: Diagnosis not present

## 2021-08-23 LAB — RAD ONC ARIA SESSION SUMMARY
Course Elapsed Days: 38
Plan Fractions Treated to Date: 28
Plan Prescribed Dose Per Fraction: 2.5 Gy
Plan Total Fractions Prescribed: 28
Plan Total Prescribed Dose: 70 Gy
Reference Point Dosage Given to Date: 70 Gy
Reference Point Session Dosage Given: 2.5 Gy
Session Number: 28

## 2021-08-25 ENCOUNTER — Other Ambulatory Visit: Payer: Self-pay | Admitting: Cardiovascular Disease

## 2021-08-27 NOTE — Telephone Encounter (Signed)
Prescription refill request for Eliquis received. Indication:Afib Last office visit:4/23 Scr:1.2 Age: 81 Weight:90.9 kg  Prescription refilled

## 2021-09-03 DIAGNOSIS — G4733 Obstructive sleep apnea (adult) (pediatric): Secondary | ICD-10-CM | POA: Diagnosis not present

## 2021-09-10 DIAGNOSIS — G4733 Obstructive sleep apnea (adult) (pediatric): Secondary | ICD-10-CM | POA: Diagnosis not present

## 2021-09-17 ENCOUNTER — Other Ambulatory Visit: Payer: Self-pay | Admitting: Internal Medicine

## 2021-09-18 DIAGNOSIS — M1712 Unilateral primary osteoarthritis, left knee: Secondary | ICD-10-CM | POA: Diagnosis not present

## 2021-09-19 ENCOUNTER — Telehealth: Payer: Self-pay | Admitting: Family

## 2021-09-19 NOTE — Telephone Encounter (Signed)
Left message for patient to call back and schedule Medicare Annual Wellness Visit (AWV).   Please offer to do virtually or by telephone.  Left office number and my jabber [phone].  Last AWV:10/29/2019  Please schedule at anytime with Nurse Health Advisor.

## 2021-10-04 DIAGNOSIS — G4733 Obstructive sleep apnea (adult) (pediatric): Secondary | ICD-10-CM | POA: Diagnosis not present

## 2021-10-08 ENCOUNTER — Ambulatory Visit: Payer: Self-pay | Admitting: *Deleted

## 2021-10-08 ENCOUNTER — Encounter: Payer: Self-pay | Admitting: *Deleted

## 2021-10-08 NOTE — Patient Outreach (Signed)
  Care Coordination   Initial Visit Note   10/08/2021 Name: Mario [name] MRN: [id number] DOB: [birth year]  Mario [name] is a 81 y.o. year old male who sees Marrian Salvage, FNP for primary care. I spoke with  [name] by phone today.  What matters to the patients health and wellness today?  "I am doing really well since my prostate cancer treatments are finally over; I am finally getting back to my normal self; I have started going back to the gym to get in training for my next bench-press competition- it's in January, so I am getting ready for that now; I am eating good and feeling good; I am still going into A-Fib every now and then, but I am on blood thinners, and my episodes never last very long-- as long as I just rest, it goes away all on it's own, and I know what triggers it, so I avoid those things"    Goals Addressed             This Visit's Progress    COMPLETED: Care Coordination Activities: no follow up requied   On track    Care Coordination Interventions: Evaluation of current treatment plan related to A-Fib and recent prostate CA therapy and patient's adherence to plan as established by provider Advised patient to consider scheduling PCP appointment since therapy for prostate CA is now completed- last office visit noted 05/15/21- patient will consider Provided education to patient re: purpose and value of medicare annual wellness visit: patient reports "not ready to schedule" at this time-- wants to give it "awhile" since he has now completed his prostate CA therapy- encouraged patient to call when he is ready to schedule, he declines need for call-back Reviewed medications with patient and discussed purpose of ACT in setting of A-Fib Reviewed scheduled/upcoming provider appointments including 10/10/21- radiation oncology provider Advised patient to discuss recent episodes A-F with spontaneous cessation with provider Assessed social determinant of health barriers Reviewed  signs/ symptoms of A-F along with corresponding action plan for same: patient very knowledgeable, able to verbalize accurately without prompting         SDOH assessments and interventions completed:  Yes  SDOH Interventions Today    Flowsheet Row Most Recent Value  SDOH Interventions   Food Insecurity Interventions Intervention Not Indicated  Transportation Interventions Intervention Not Indicated  [drives self]       Care Coordination Interventions Activated:  Yes  Care Coordination Interventions:  Yes, provided   Follow up plan: No further intervention required.   Encounter Outcome:  Pt. Visit Completed   Oneta Rack, RN, BSN, CCRN Alumnus RN CM Care Coordination/ Transition of [facility] [facility] [phone]: direct office

## 2021-10-09 ENCOUNTER — Encounter: Payer: Self-pay | Admitting: Urology

## 2021-10-09 ENCOUNTER — Telehealth: Payer: Self-pay

## 2021-10-09 NOTE — Progress Notes (Signed)
Telephone appointment. I verified patient's identity and began nursing interview. Patient reports no sex drive post [place]. Patient would like to know if there is anything that can be done about this. No other issues reported at this time.  Meaningful use complete. I-PSS score of 4-mild. No urinary management medications. Urology appt- November 30, 2021  Reminded patient of his 11:00am-10/10/21 telephone appointment w/ Ashlyn Bruning PA-C. I left my extension [phone] in case patient needs anything. Patient verbalized understanding.  Patient contact [phone]

## 2021-10-09 NOTE — Telephone Encounter (Signed)
Voicemail reminder for patient's 11:00am-10/10/21 telephone appointment w/ Ashlyn Bruning PA-C. I left my extension [phone] and requested that patient return my call for completion of the nursing portion.

## 2021-10-10 ENCOUNTER — Ambulatory Visit
Admission: RE | Admit: 2021-10-10 | Discharge: 2021-10-10 | Disposition: A | Discharge status: Home / Self Care | Payer: Medicare HMO | Source: Ambulatory Visit | Attending: Urology | Admitting: Urology

## 2021-10-10 DIAGNOSIS — C61 Malignant neoplasm of prostate: Secondary | ICD-10-CM

## 2021-10-10 NOTE — Progress Notes (Signed)
Radiation Oncology         ([phone]) [phone] ________________________________  Name: Mario [name] MRN: [id number]  Date: 10/10/2021  DOB: [birth year]  Post Treatment Note  CC: Marrian Salvage, FNP  Lucas Mallow, MD  Diagnosis:   81 y.o. gentleman with stage T1c adenocarcinoma of the prostate with a Gleason's score of 3+4 and a PSA of 11.1   Interval Since Last Radiation:  6 weeks   07/16/21 - 08/23/21:  The prostate was treated to 70 Gy in 28 fractions of 2.5 Gy  Narrative:  The patient returns today for routine follow-up.  He tolerated radiation treatment relatively well with only minor urinary irritation and modest fatigue.  He did experience some severe dysuria, increased frequency, urgency and nocturia 10-12 times per night.  The dysuria initially responded to AZO but returned and at that time, only responded when treated with Cipro despite a negative urine culture.  He also experienced loose stool/diarrhea that was managed with Imodium as needed.                              On review of systems, the patient states that he is doing well in general.  His LUTS continue to gradually improve and he specifically denies dysuria, gross hematuria, straining to void, incomplete bladder emptying or incontinence.  He continues with mild increased frequency, urgency and nocturia but denies any abdominal pain or bowel issues.  He reports a healthy appetite and is maintaining his weight.  He did lose approximately 20 pounds during the course of treatment but has gained most of this back at this point.  He has resumed working out and training regularly now that his energy level is improved.  Overall, he is pleased with his progress to date.  ALLERGIES:  is allergic to contrast media [iodinated contrast media] and lisinopril.  Meds: Current Outpatient Medications  Medication Sig Dispense Refill   ciprofloxacin (CIPRO) 500 MG tablet Take 1 tablet (500 mg total) by mouth 2 (two) times daily. 14 tablet  0   amLODipine (NORVASC) 10 MG tablet Take 1 tablet (10 mg total) by mouth daily. Please call [phone] to schedule an overdue appointment for future refills. Thank you. 1st attempt. 30 tablet 0   apixaban (ELIQUIS) 5 MG TABS tablet Take 1 tablet by mouth twice daily 180 tablet 1   carvedilol (COREG) 3.125 MG tablet Take 1 tablet (3.125 mg total) by mouth 2 (two) times daily. 180 tablet 3   Cholecalciferol (DIALYVITE VITAMIN D 5000) 125 MCG (5000 UT) capsule Take 5,000 Units by mouth daily.     fluticasone (FLONASE) 50 MCG/ACT nasal spray Place 1 spray into both nostrils at bedtime.     GLUCOSAMINE-CHONDROITIN PO Take 1 tablet by mouth daily.      hydrALAZINE (APRESOLINE) 25 MG tablet TAKE 1 TABLET BY MOUTH TWICE DAILY . APPOINTMENT REQUIRED FOR FUTURE REFILLS (Patient taking differently: Take 25 mg by mouth 2 (two) times daily.) 180 tablet 1   irbesartan (AVAPRO) 150 MG tablet Take 1 tablet (150 mg total) by mouth daily. (Patient taking differently: Take 150 mg by mouth daily.) 90 tablet 3   Lutein 20 MG CAPS Take 20 mg by mouth daily.     Multiple Vitamin (MULTIVITAMIN) tablet Take 1 tablet by mouth daily.     OVER THE COUNTER MEDICATION Place 4-5 drops into both eyes as needed (dry eyes). Thera tears     psyllium (METAMUCIL)  58.6 % powder Take 1 packet by mouth at bedtime.     Saw Palmetto, Serenoa repens, (SAW PALMETTO PO) Take 1 tablet by mouth daily.     testosterone cypionate (DEPOTESTOSTERONE CYPIONATE) 200 MG/ML injection Inject 100 mg into the muscle every Wednesday.     triamcinolone (KENALOG) 0.1 % Apply 1 application topically 2 (two) times daily as needed (red bumps).     No current facility-administered medications for this encounter.    Physical Findings:  vitals were not taken for this visit.  Pain Assessment Pain Score: 0-No pain/10 Unable to assess due to telephone follow-up visit format.  Lab Findings: Lab Results  Component Value Date   WBC 6.2 03/30/2021   HGB  15.6 06/27/2021   HCT 46.0 06/27/2021   MCV 92.2 03/30/2021   PLT 200 03/30/2021     Radiographic Findings: No results found.  Impression/Plan: 1. 81 y.o. gentleman with stage T1c adenocarcinoma of the prostate with a Gleason's score of 3+4 and a PSA of 11.1. He will continue to follow up with urology for ongoing PSA determinations and has an appointment scheduled with Dr. Gloriann Loan on 11/30/2021. He understands what to expect with regards to PSA monitoring going forward. I will look forward to following his response to treatment via correspondence with urology, and would be happy to continue to participate in his care if clinically indicated. I talked to the patient about what to expect in the future, including his risk for erectile dysfunction and rectal bleeding. I encouraged him to call or return to the office if he has any questions regarding his previous radiation or possible radiation side effects. He was comfortable with this plan and will follow up as needed.      Nicholos Johns, PA-C

## 2021-10-10 NOTE — Progress Notes (Signed)
  Radiation Oncology         [phone]) [phone] ________________________________  Name: Ogle Hoeffner MRN: [id number]  Date: 08/23/2021  DOB: 06/01/1940  End of Treatment Note  Diagnosis:   81 y.o. gentleman with stage T1c adenocarcinoma of the prostate with a Gleason's score of 3+4 and a PSA of 11.1     Indication for treatment:  Curative, Definitive Radiotherapy       Radiation treatment dates:   07/16/21 - 08/23/21  Site/dose:   The prostate was treated to 70 Gy in 28 fractions of 2.5 Gy  Beams/energy:   The patient was treated with IMRT using volumetric arc therapy delivering 6 MV X-rays to clockwise and counterclockwise circumferential arcs with a 90 degree collimator offset to avoid dose scalloping.  Image guidance was performed with daily cone beam CT prior to each fraction to align to gold markers in the prostate and assure proper bladder and rectal fill volumes.  Immobilization was achieved with BodyFix custom mold.  Narrative: The patient tolerated radiation treatment relatively well with only minor urinary irritation and modest fatigue.  He did experience some severe dysuria, increased frequency, urgency and nocturia 10-12 times per night.  The dysuria initially responded to AZO but returned and at that time, only responded when treated with Cipro despite a negative urine culture.  He also experienced loose stool/diarrhea that was managed with Imodium as needed.  Plan: The patient has completed radiation treatment. He will return to radiation oncology clinic for routine followup in one month. I advised him to call or return sooner if he has any questions or concerns related to his recovery or treatment. ________________________________  Sheral Apley. Tammi Klippel, M.D.

## 2021-10-20 IMAGING — CT CT HEART MORPH/PULM VEIN W/ CM & W/O CA SCORE
2 of 7 series · 11 of 20 positions shown, 13 images · IV contrast (Omni 300)
Comparison: None
COMPARISON: None

Addendum:
EXAM:
OVER-READ INTERPRETATION  CT CHEST

The following report is an over-read performed by radiologist Dr.
over-read does not include interpretation of cardiac or coronary
anatomy or pathology. The coronary calcium score/coronary CTA
interpretation by the cardiologist is attached.
CLINICAL DATA: Atrial fibrillation scheduled for an ablation.
Cardiac CT/CTA
TECHNIQUE: A 100 kV prospective scan was triggered in the ascending thoracic
aorta at 140 HU's. Gantry rotation speed was 250 msecs and
collimation was .6 mm. No beta blockade and no NTG was given. The 3D
data set was reconstructed for best systolic and diastolic phases
along with delayed images of the JIM Images analyzed on a dedicated
work station using MPR, MIP and VRT modes. The patient received 80
cc of contrast.

[Series 10: 0-95% · axial · 0.39mm/px · z∈[+1062,+1168]mm · 5 of 3200 slices shown]
[im 534/3200  vessel]
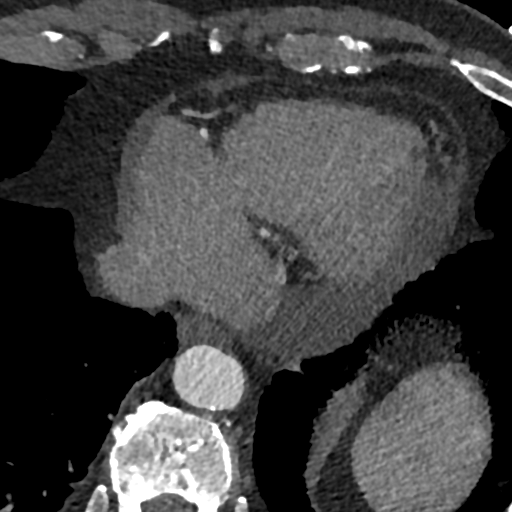
[im 1067/3200  vessel]
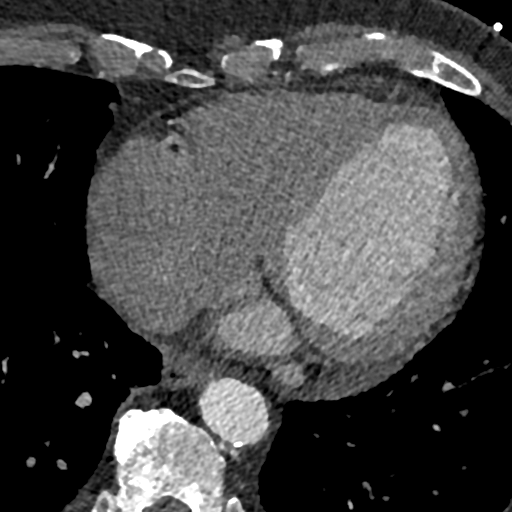
[im 1600/3200  vessel]
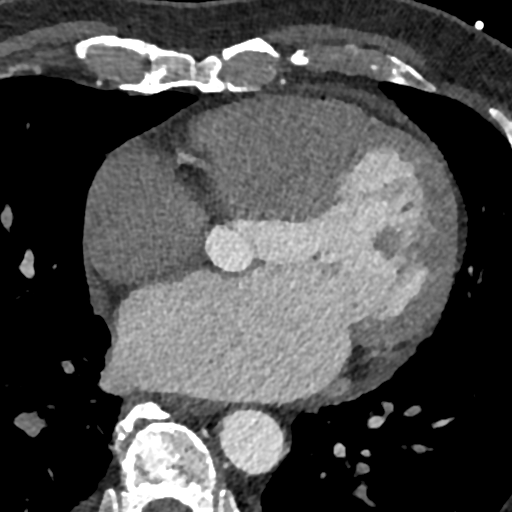
[im 2133/3200  vessel]
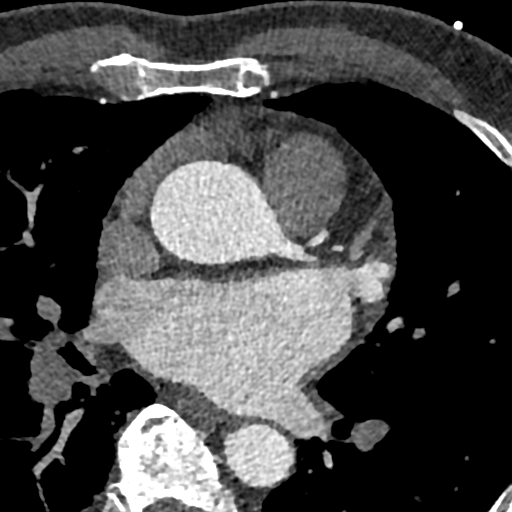
[im 2666/3200  vessel]
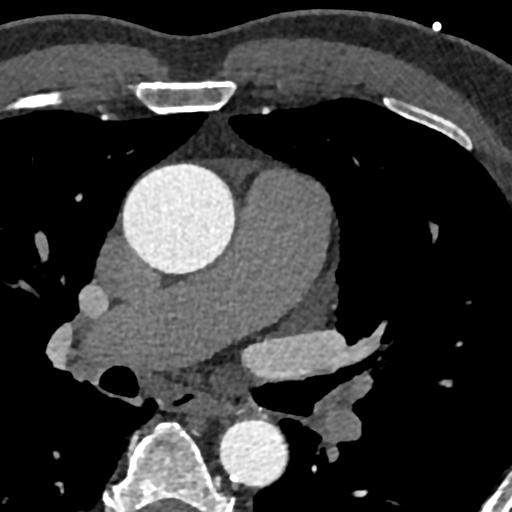

[Series 13: 5-95% · axial · 0.87mm/px · z∈[+1058,+1172]mm · 6 of 3200 slices shown, 8 images]
[im 458/3200  vessel]
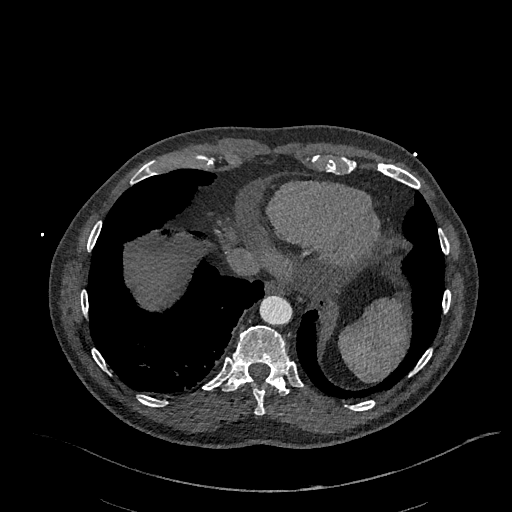
[im 458/3200  lung]
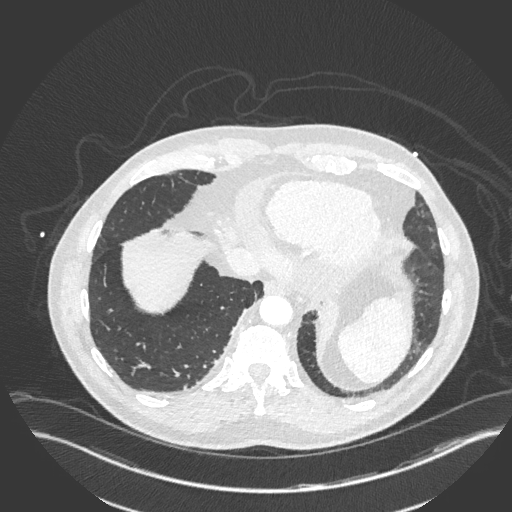
[im 915/3200  vessel]
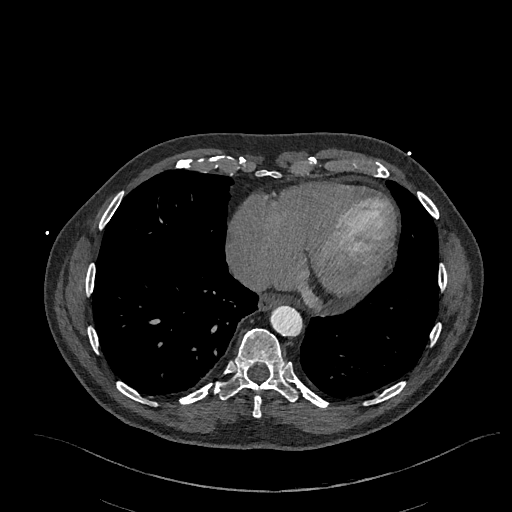
[im 1372/3200  vessel]
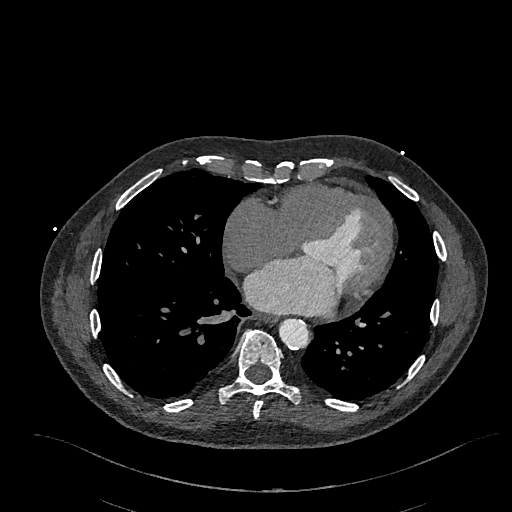
[im 1829/3200  vessel]
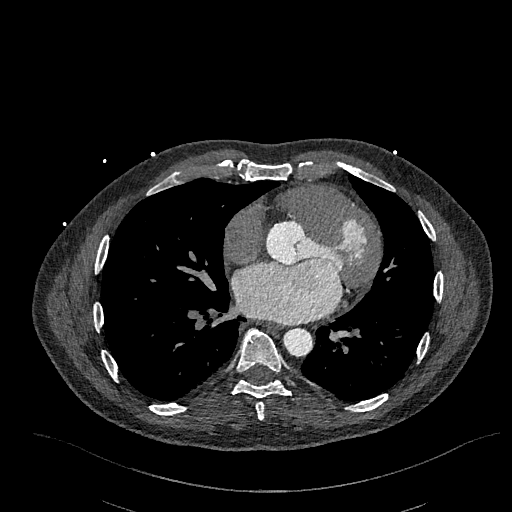
[im 2286/3200  vessel]
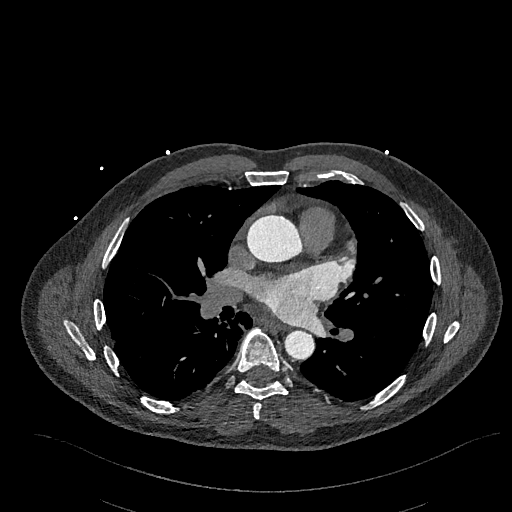
[im 2286/3200  lung]
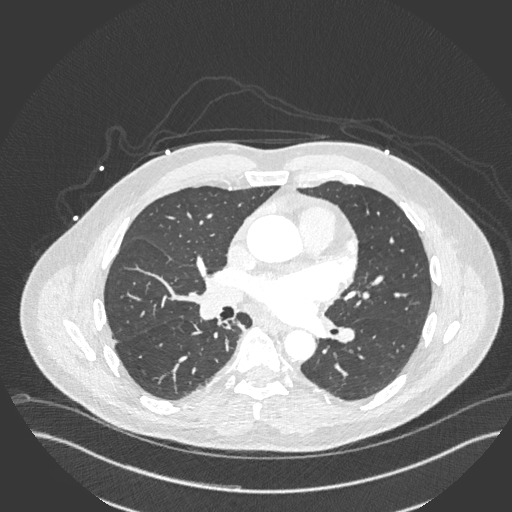
[im 2743/3200  vessel]
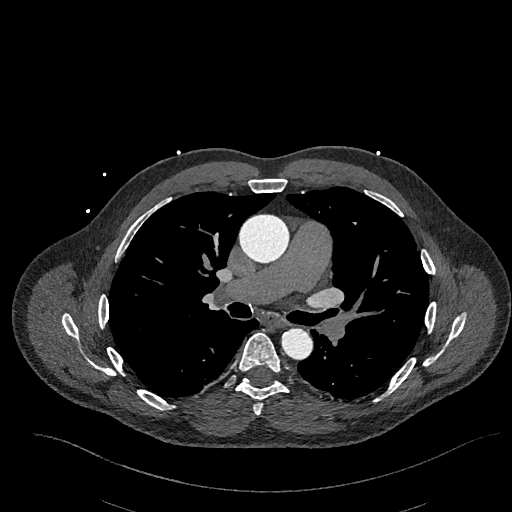

[11 of 20 positions shown; findings below may reference images not displayed]

FINDINGS: Cardiovascular:

Mediastinum: No signs of adenopathy in the mediastinum or bilateral
hila with respect to imaged portions.

Lungs/Pleura: No sign of consolidation or evidence of pleural
effusion. Airways are patent.

Upper Abdomen: Incidental imaging of upper abdominal contents is
quite limited without acute process.

Musculoskeletal: Spinal degenerative changes. No acute or
destructive bone finding.
IMPRESSION: 4.1 cm dilation of the ascending thoracic aorta. Mild aneurysmal
caliber. Could consider the following as warranted. Annual imaging
followup by CTA or MRA. This recommendation follows 3989
ACCF/AHA/AATS/ACR/ASA/SCA/KALENA/BAMBUCAFE/OPEYEMMY/TANU Guidelines for the
Diagnosis and Management of Patients with Thoracic Aortic Disease.
Circulation. 3989; 121: E266-e369. Aortic aneurysm NOS (UW258-3U3.H)

Aortic atherosclerosis

Otherwise no acute or significant extracardiac findings.
FINDINGS: Image quality: excellent.

Noise artifact is: Limited.

Pulmonary Veins: There is normal pulmonary vein drainage into the
left atrium (2 on the right and 2 on the left) with ostial
measurements as follows:

RUPV: Ostium 29 mm x 27 mm  area 563 mm2

RLPV:  Ostium 21 mm x 17 mm  area 282 mm2

LUPV:  Ostium 27 mm x 20 mm area 411 mm2

LLPV:  Ostium 20 mm x 17 mm  area 258 mm2

Left Atrium: The left atrial size is dilated. There is no PFO/ASD.
The left atrial appendage is large windsock type with two lobes and
ostial size 27.7 mm x 24.7 mm and length 38.8 mm. There is no
thrombus in the left atrial appendage on contrast or delayed
imaging. The esophagus runs in the left atrial midline and is not in
the proximity to any of the pulmonary veins.

Coronary Arteries: CAC score of 29, which is 12th percentile for
age- and sex-matched controls. Normal coronary origin. Right
dominance. The study was performed without use of NTG and is
insufficient for plaque evaluation.

Right Atrium: Right atrial size is within normal limits.

Right Ventricle: The right ventricular cavity is within normal
limits.

Left Ventricle: The ventricular cavity size is within normal limits.
There are no stigmata of prior infarction. There is no abnormal
filling defect.

Pericardium: Normal thickness with no significant effusion or
calcium present.

Pulmonary Artery: Normal caliber without proximal filling defect.

Cardiac valves: The aortic valve is trileaflet without significant
calcification. The mitral valve is normal structure without
significant calcification.

Aorta: Mildly dilated ascending aorta up to 42 mm measured using
double oblique technique.

Extra-cardiac findings: See attached radiology report for
non-cardiac structures.
IMPRESSION: 1. There is normal pulmonary vein drainage into the left atrium with
ostial measurements above.

2. There is no thrombus in the left atrial appendage.

3. The esophagus runs in the left atrial midline and is not in the
proximity to any of the pulmonary veins.

4. No PFO/ASD.

5. Normal coronary origin. Right dominance.

6. CAC score of 29, which is 12th percentile for age- and
sex-matched controls.

7. Mildly dilated ascending aorta up to 42 mm measured using double
oblique technique.

*** End of Addendum ***
EXAM:
OVER-READ INTERPRETATION  CT CHEST

The following report is an over-read performed by radiologist Dr.
over-read does not include interpretation of cardiac or coronary
anatomy or pathology. The coronary calcium score/coronary CTA
interpretation by the cardiologist is attached.
FINDINGS: Cardiovascular:

Mediastinum: No signs of adenopathy in the mediastinum or bilateral
hila with respect to imaged portions.

Lungs/Pleura: No sign of consolidation or evidence of pleural
effusion. Airways are patent.

Upper Abdomen: Incidental imaging of upper abdominal contents is
quite limited without acute process.

Musculoskeletal: Spinal degenerative changes. No acute or
destructive bone finding.
IMPRESSION: 4.1 cm dilation of the ascending thoracic aorta. Mild aneurysmal
caliber. Could consider the following as warranted. Annual imaging
followup by CTA or MRA. This recommendation follows 3989
ACCF/AHA/AATS/ACR/ASA/SCA/KALENA/BAMBUCAFE/OPEYEMMY/TANU Guidelines for the
Diagnosis and Management of Patients with Thoracic Aortic Disease.
Circulation. 3989; 121: [id number]. Aortic aneurysm NOS (UW258-3U3.H)

Aortic atherosclerosis

Otherwise no acute or significant extracardiac findings.

## 2021-10-22 ENCOUNTER — Ambulatory Visit: Payer: Medicare HMO | Admitting: Internal Medicine

## 2021-10-22 ENCOUNTER — Other Ambulatory Visit: Payer: Self-pay | Admitting: Cardiovascular Disease

## 2021-10-22 NOTE — Progress Notes (Unsigned)
PCP:  Marrian Salvage, [place] Primary Cardiologist: Shelva Majestic, MD Electrophysiologist: [name], MD -> Dr. Lorain Childes Holt is a 81 y.o. male seen today for [name], MD for routine electrophysiology followup.  last being seen in our clinic the patient reports doing OK overall. For the past couple of days he has had intermittent palpitations and increasing fatigue. He felt like he was out of rhythm last night and took his coreg. His heart rate became more regular, and this am he wasn't sure if he was in rhythm or not.   he denies chest pain, palpitations, dyspnea, PND, orthopnea, nausea, vomiting, dizziness, syncope, edema, weight gain, or early satiety.   Past Medical History:  Diagnosis Date   Anticoagulant long-term use    eliquis--- managed by cardiology   Arthritis    neck   Benign localized prostatic hyperplasia with lower urinary tract symptoms (LUTS)    Blister    06-25-2021  per pt has a small blister at end of tailbone from doing sit-up's, covers w/ band-aid   ED (erectile dysfunction)    First degree heart block    History of adenomatous polyp of colon    History of basal cell carcinoma (BCC) of skin    excision multiple area's   History of kidney stones    History of malignant melanoma of back 2000   mid back  s/p WLE 12/ 2011 stage 1, localized per pt and no recurrence   History of narrow angle glaucoma    per pt s/p laser both eyes no issues since approx 2006   History of squamous cell carcinoma of skin    excision multiple area's   Hypertension    Hypogonadism in male    Malignant neoplasm prostate ([place]) 03/2021   urologist--- dr bell/  radiation oncology-- dr Tammi Klippel;  dx 02/ 2037m Gleason 3+4   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    per cardiac CT 02-24-2020   437m  OSA on CPAP    per pt uses nightly   [facility]   [facility]ologist--- dr t.Darene LamerkeClaiborne Billings  [place];  03-02-2020  s/p afib ablation by  Dr AlRayann Heman Seasonal allergies    Wears hearing aid in both ears    Past Surgical History:  Procedure Laterality Date   ATRIAL FIBRILLATION ABLATION N/A 03/02/2020   Procedure: ATRIAL FIBRILLATION ABLATION;  Surgeon: AlThompson GrayerMD;  [place];  Service: Cardiovascular;  Laterality: N/A;   CARDIOVERSION N/A 12/01/2014   Procedure: CARDIOVERSION;  Surgeon: ThTroy SineMD;  [place] Service: Cardiovascular;  Laterality: N/A;   CARDIOVERSION N/A 02/22/2020   Procedure: CARDIOVERSION;  Surgeon: ChBuford DresserMD;  [facility];  Service: Cardiovascular;  Laterality: N/A;   CATARACT EXTRACTION W/ INTRAOCULAR LENS IMPLANT Bilateral    2000; 2004   COLONOSCOPY WITH ESOPHAGOGASTRODUODENOSCOPY (EGD)  09/2019   CYSTOSCOPY W/ URETEROSCOPY W/ LITHOTRIPSY  05/12/2003   '@[facility]    ELBOW SURGERY Left 1984   tendon repair left arm   GOLD SEED IMPLANT N/A 06/27/2021   Procedure: GOLD SEED IMPLANT;  Surgeon: BeLucas MallowMD;  [facility]: U[facility];  Laterality: N/A;   INCISION AND DRAINAGE OF WOUND Left 08/28/2016   Procedure: IRRIGATION AND DEBRIDEMENT WOUND;  Surgeon: OrIran PlanasMD;  [place] Service: Orthopedics;  Laterality: Left;   LIPOMA EXCISION Right 04/18/2010   right  thigh   MASS EXCISION Left 07/2001   excision left axila mass (benign)   MELANOMA EXCISION  01/2000   @ [facility];   WLE of mid back for melanoma and excision resection lipoma upper back   METACARPAL OSTEOTOMY Left 08/28/2016   Procedure: 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.;  Surgeon: Iran Planas, MD;  Location: [place];  Service: Orthopedics;  Laterality: Left;   MICROLARYNGOSCOPY WITH LASER N/A 01/25/2015   Procedure: MICROLARYNGOSCOPY ;  Surgeon: Melida Quitter, MD;  Location: [place];  Service: ENT;  Laterality: N/A;  Microlaryngoscopy with 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 with CO2 laser    SATURATION BIOPSY  OF PROSTATE  01/18/2005   '@[facility]   w/ anesthesia   SPACE OAR INSTILLATION N/A 06/27/2021   Procedure: SPACE OAR INSTILLATION;  Surgeon: Lucas Mallow, MD;  [facility];  Service: Urology;  Laterality: N/A;   TENDON REPAIR Right 2012   right  involving elbow, arm. hand   TONSILLECTOMY  1970    Current Outpatient Medications  Medication Sig Dispense Refill   amLODipine (NORVASC) 10 MG tablet TAKE 1 TABLET BY MOUTH ONCE DAILY -PATIENT  NEEDS  APPOINTMENT 30 tablet 0   apixaban (ELIQUIS) 5 MG TABS tablet Take 1 tablet by mouth twice daily 180 tablet 1   Cholecalciferol (DIALYVITE VITAMIN D 5000) 125 MCG (5000 UT) capsule Take 5,000 Units by mouth daily.     fluticasone (FLONASE) 50 MCG/ACT nasal spray Place 1 spray into both nostrils at bedtime.     GLUCOSAMINE-CHONDROITIN PO Take 1 tablet by mouth daily.      hydrALAZINE (APRESOLINE) 25 MG tablet TAKE 1 TABLET BY MOUTH TWICE DAILY . APPOINTMENT REQUIRED FOR FUTURE REFILLS (Patient taking differently: Take 25 mg by mouth 2 (two) times daily.) 180 tablet 1   irbesartan (AVAPRO) 150 MG tablet Take 1 tablet (150 mg total) by mouth daily. (Patient taking differently: Take 150 mg by mouth daily.) 90 tablet 3   Lutein 20 MG CAPS Take 20 mg by mouth daily.     Multiple Vitamin (MULTIVITAMIN) tablet Take 1 tablet by mouth daily.     OVER THE COUNTER MEDICATION Place 4-5 drops into both eyes as needed (dry eyes). Thera tears     psyllium (METAMUCIL) 58.6 % powder Take 1 packet by mouth at bedtime.     Saw Palmetto, Serenoa repens, (SAW PALMETTO PO) Take 1 tablet by mouth daily.     testosterone cypionate (DEPOTESTOSTERONE CYPIONATE) 200 MG/ML injection Inject 100 mg into the muscle every Wednesday.     triamcinolone (KENALOG) 0.1 % Apply 1 application topically 2 (two) times daily as needed (red bumps).     carvedilol (COREG) 3.125 MG tablet Take 1 tablet (3.125 mg total) by mouth 2 (two) times daily. 180 tablet 3   No  current facility-administered medications for this visit.    Allergies  Allergen Reactions   Contrast Media [Iodinated Contrast Media] Shortness Of Breath, Rash and Other (See Comments)    First noted after Coronary CTA 02/24/20. Pt experienced wheezing, shortness of breath and diffuse rash/welts over face and trunk.   Lisinopril Other (See Comments)    REACTION: severe GI upset requiring EGD--temporally related to lisinopril--tolerated micardis    Social History   Socioeconomic History   Marital status: Married    Spouse name: Not on file   Number of children: 2   Years of education: Not on file   Highest education level: Not on file  Occupational History   Occupation: retired  Tobacco Use   Smoking status: Former    Years: 30.00    Types: Cigarettes    Start date: 11/20/1976    Quit date: 1986    Years since quitting: 37.7   Smokeless tobacco: Never  Vaping Use   Vaping Use: Never used  Substance and Sexual Activity   Alcohol use: Not Currently   Drug use: Never   Sexual activity: Not on file  Other Topics Concern   Not on file  Social History Narrative   Lives in [place].  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   Social Determinants of Health   Financial Resource Strain: Not on file  Food Insecurity: No Food Insecurity (10/08/2021)   Hunger Vital Sign    Worried About Running Out of Food in the Last Year: Never true    Ran Out of Food in the Last Year: Never true  Transportation Needs: No Transportation Needs (10/08/2021)   PRAPARE - Hydrologist (Medical): No    Lack of Transportation (Non-Medical): No  Physical Activity: Not on file  Stress: Not on file  Social Connections: Not on file  Intimate Partner Violence: Not on file     Review of Systems: All other systems reviewed and are otherwise negative except as noted above.  Physical Exam: Vitals:   10/23/21 0918  BP: 130/70  Pulse: 63  SpO2: 97%   Weight: 200 lb 6.4 oz (90.9 kg)  Height: 6' (1.829 m)    GEN- The patient is well appearing, alert and oriented x 3 today.   HEENT: normocephalic, atraumatic; sclera clear, conjunctiva pink; hearing intact; oropharynx clear; neck supple, no JVP Lymph- no cervical lymphadenopathy Lungs- Clear to ausculation bilaterally, normal work of breathing.  No wheezes, rales, rhonchi Heart- Regular rate and rhythm, no murmurs, rubs or gallops, PMI not laterally displaced GI- soft, non-tender, non-distended, bowel sounds present, no hepatosplenomegaly Extremities- No peripheral edema. no clubbing or cyanosis; DP/PT/radial pulses 2+ bilaterally MS- no significant deformity or atrophy Skin- warm and dry, no rash or lesion Psych- euthymic mood, full affect Neuro- strength and sensation are intact  EKG is ordered. Personal review of EKG from today shows 4:1 atrial (atypical) flutter at 63 bpm  Additional studies reviewed include: Previous EP office notes.   Assessment and Plan:  1. Persistent Atrial Fibrillation  2. Atypical Atrial Flutter S/p Ablation 02/2020. Dr. Rayann Heman described PVI ablation, posterior "box" lesion, right atrial flutter with CTI ablation, and at least 2 other flutters that were NOT ablated. EKG today shows 4:1 atrial flutter, atypical Continue Eliquis for CHA2DS2VASC of at least 3 Continue Coreg    We discussed AADs including flecainide, amiodarone, and tikosyn, reviewing uses, risks, and monitoring for each. Pt would like to re-try flecainide at this time, with close follow up with Dr. Myles Gip to discuss possible re-do ablation. Dr. Myles Gip was in to see the patient as well.  With symptoms, will plan [facility] after flecainide "loading". Previously, pt as been having paroxysms of AF/AFL, so perhaps will convert chemically on flecainide.  2. HTN Stable on current regimen   3. AI Mild/Mod by prior echo  Follow up with Afib Clinic in 10 days for flecainide follow up with EKG -> DCC if  remains in flutter.   Follow up with Dr. Myles Gip in 4-5 weeks to assess response to flecainide and discuss possibility of re-do ablation.   Shirley Friar, PA-C  10/23/21 9:42 AM

## 2021-10-23 ENCOUNTER — Ambulatory Visit: Payer: Medicare HMO | Attending: Student | Admitting: Student

## 2021-10-23 ENCOUNTER — Encounter: Payer: Self-pay | Admitting: Student

## 2021-10-23 VITALS — BP 130/70 | HR 63 | Ht 72.0 in | Wt 200.4 lb

## 2021-10-23 DIAGNOSIS — I10 Essential (primary) hypertension: Secondary | ICD-10-CM

## 2021-10-23 DIAGNOSIS — I4819 Other persistent atrial fibrillation: Secondary | ICD-10-CM | POA: Diagnosis not present

## 2021-10-23 DIAGNOSIS — I350 Nonrheumatic aortic (valve) stenosis: Secondary | ICD-10-CM

## 2021-10-23 MED ORDER — FLECAINIDE ACETATE 100 MG PO TABS: 100.0000 mg | ORAL_TABLET | Freq: Two times a day (BID) | ORAL | 3 refills | Status: DC

## 2021-10-23 NOTE — Patient Instructions (Signed)
Medication Instructions:  Your physician has recommended you make the following change in your medication:   START: Flecainide '100mg'$   twice daily  *If you need a refill on your cardiac medications before your next appointment, please call your pharmacy*   Lab Work: None If you have labs (blood work) drawn today and your tests are completely normal, you will receive your results only by: [place] (if you have MyChart) OR A paper copy in the mail If you have any lab test that is abnormal or we need to change your treatment, we will call you to review the results.   Follow-Up: At [facility], you and your health needs are our priority.  As part of our continuing mission to provide you with exceptional heart care, we have created designated Provider Care Teams.  These Care Teams include your primary Cardiologist (physician) and Advanced Practice Providers (APPs -  Physician Assistants and Nurse Practitioners) who all work together to provide you with the care you need, when you need it.  We recommend signing up for the patient portal called "MyChart".  Sign up information is provided on this After Visit Summary.  MyChart is used to connect with patients for Virtual Visits (Telemedicine).  Patients are able to view lab/test results, encounter notes, upcoming appointments, etc.  Non-urgent messages can be sent to your provider as well.   To learn more about what you can do with MyChart, go to [email or web address].    Your next appointment:   10 day f/u in [facility] 4-6 week f/u with Dr. Myles Gip

## 2021-11-01 DIAGNOSIS — Z85820 Personal history of malignant melanoma of skin: Secondary | ICD-10-CM | POA: Diagnosis not present

## 2021-11-01 DIAGNOSIS — B0089 Other herpesviral infection: Secondary | ICD-10-CM | POA: Diagnosis not present

## 2021-11-01 DIAGNOSIS — B029 Zoster without complications: Secondary | ICD-10-CM | POA: Diagnosis not present

## 2021-11-02 ENCOUNTER — Ambulatory Visit (HOSPITAL_COMMUNITY)
Admission: RE | Admit: 2021-11-02 | Discharge: 2021-11-02 | Disposition: A | Discharge status: Home / Self Care | Payer: Medicare HMO | Source: Ambulatory Visit | Attending: Physician Assistant | Admitting: Physician Assistant

## 2021-11-02 VITALS — BP 126/60 | HR 81 | Ht 72.0 in | Wt 199.4 lb

## 2021-11-02 DIAGNOSIS — G4733 Obstructive sleep apnea (adult) (pediatric): Secondary | ICD-10-CM | POA: Insufficient documentation

## 2021-11-02 DIAGNOSIS — I4819 Other persistent atrial fibrillation: Secondary | ICD-10-CM | POA: Diagnosis not present

## 2021-11-02 DIAGNOSIS — I484 Atypical atrial flutter: Secondary | ICD-10-CM | POA: Diagnosis not present

## 2021-11-02 DIAGNOSIS — I4892 Unspecified atrial flutter: Secondary | ICD-10-CM | POA: Insufficient documentation

## 2021-11-02 DIAGNOSIS — D6869 Other thrombophilia: Secondary | ICD-10-CM | POA: Diagnosis not present

## 2021-11-02 DIAGNOSIS — Z7901 Long term (current) use of anticoagulants: Secondary | ICD-10-CM | POA: Diagnosis not present

## 2021-11-02 DIAGNOSIS — I10 Essential (primary) hypertension: Secondary | ICD-10-CM | POA: Insufficient documentation

## 2021-11-02 LAB — CBC
HCT: 43 % (ref 39.0–52.0)
Hemoglobin: 14.6 g/dL (ref 13.0–17.0)
MCH: 32.7 pg (ref 26.0–34.0)
MCHC: 34 g/dL (ref 30.0–36.0)
MCV: 96.4 fL (ref 80.0–100.0)
Platelets: 176 10*3/uL (ref 150–400)
RBC: 4.46 MIL/uL (ref 4.22–5.81)
RDW: 14.3 % (ref 11.5–15.5)
WBC: 4.8 10*3/uL (ref 4.0–10.5)
nRBC: 0 % (ref 0.0–0.2)

## 2021-11-02 LAB — BASIC METABOLIC PANEL
Anion gap: 10 (ref 5–15)
BUN: 26 mg/dL — ABNORMAL HIGH (ref 8–23)
CO2: 21 mmol/L — ABNORMAL LOW (ref 22–32)
Calcium: 9.3 mg/dL (ref 8.9–10.3)
Chloride: 103 mmol/L (ref 98–111)
Creatinine, Ser: 1.53 mg/dL — ABNORMAL HIGH (ref 0.61–1.24)
GFR, Estimated: 45 mL/min — ABNORMAL LOW (ref >60)
Glucose, Bld: 98 mg/dL (ref 70–99)
Potassium: 4.4 mmol/L (ref 3.5–5.1)
Sodium: 134 mmol/L — ABNORMAL LOW (ref 135–145)

## 2021-11-02 NOTE — Progress Notes (Signed)
Primary Care Physician: Marrian Salvage, [place] Primary Cardiologist: Dr Claiborne Billings Primary Electrophysiologist: Dr Myles Gip  Referring Physician: Antonieta Pert triage   Mario [name] is a 81 y.o. male with a history of OSA, HTN, and atrial fibrillation who presents for follow up in the [facility].  The patient was initially diagnosed with atrial fibrillation 2016. He has been maintained on flecainide and Eliquis for a CHADS2VASC score of 4. He underwent afib and flutter ablation with Dr Rayann Heman on 03/02/20 and his flecainide was discontinued. He was seen in follow up 10/23/21 and was back in rate controlled atrial flutter. Flecainide was resumed.   On follow up today, patient reports that his SOB has improved but not resolved since starting flecainide. Still in atypical flutter today. No bleeding issues on anticoagulation.   Today, he denies symptoms of palpitations, chest pain, orthopnea, PND, lower extremity edema, presyncope, syncope, bleeding, or neurologic sequela. The patient is tolerating medications without difficulties and is otherwise without complaint today.    Atrial Fibrillation Risk Factors:  he does have symptoms or diagnosis of sleep apnea. he is compliant with CPAP therapy. he does not have a history of rheumatic fever. he does not have a history of alcohol use.   he has a BMI of Body mass index is 27.04 kg/m.[name] Filed Weights   11/02/21 1021  Weight: 90.4 kg    Family History  Problem Relation Age of Onset   Heart disease Mother    Stroke Father    Other Father        brain tumor-benign   Heart disease Sister    Heart disease Sister    Hypertension Sister    Colon cancer Neg Hx    Stomach cancer Neg Hx    Esophageal cancer Neg Hx    Pancreatic cancer Neg Hx    Liver disease Neg Hx      Atrial Fibrillation Management history:  Previous antiarrhythmic drugs: flecainide Previous cardioversions: 11/2014, 02/22/20 Previous ablations:  afib and flutter 02/2020 CHADS2VASC score: 4 Anticoagulation history: Eliquis   Past Medical History:  Diagnosis Date   Anticoagulant long-term use    eliquis--- managed by cardiology   Arthritis    neck   Benign localized prostatic hyperplasia with lower urinary tract symptoms (LUTS)    Blister    06-25-2021  per pt has a small blister at end of tailbone from doing sit-up's, covers w/ band-aid   ED (erectile dysfunction)    First degree heart block    History of adenomatous polyp of colon    History of basal cell carcinoma (BCC) of skin    excision multiple area's   History of kidney stones    History of malignant melanoma of back 2000   mid back  s/p WLE 12/ 2011 stage 1, localized per pt and no recurrence   History of narrow angle glaucoma    per pt s/p laser both eyes no issues since approx 2006   History of squamous cell carcinoma of skin    excision multiple area's   Hypertension    Hypogonadism in male    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  kelly;   DCCV 02-22-2020 and 12-01-2014;  03-02-2020  s/p afib ablation by Dr Rayann Heman   Seasonal allergies    Wears hearing aid in both ears    Past Surgical History:  Procedure Laterality Date   ATRIAL FIBRILLATION ABLATION N/A 03/02/2020   Procedure: ATRIAL FIBRILLATION ABLATION;  Surgeon: [name], MD;  Location: [place] [facility];  Service: Cardiovascular;  Laterality: N/A;   CARDIOVERSION N/A 12/01/2014   Procedure: CARDIOVERSION;  Surgeon: Troy Sine, MD;  Location: [place];  Service: Cardiovascular;  Laterality: N/A;   CARDIOVERSION N/A 02/22/2020   Procedure: CARDIOVERSION;  Surgeon: Buford Dresser, MD;  Location: [facility] ENDOSCOPY;   Service: Cardiovascular;  Laterality: N/A;   CATARACT EXTRACTION W/ INTRAOCULAR LENS IMPLANT Bilateral    2000; 2004   COLONOSCOPY WITH ESOPHAGOGASTRODUODENOSCOPY (EGD)  09/2019   CYSTOSCOPY W/ URETEROSCOPY W/ LITHOTRIPSY  05/12/2003   '@[facility]'$    ELBOW SURGERY Left 1984   tendon repair left arm   GOLD SEED IMPLANT N/A 06/27/2021   Procedure: GOLD SEED IMPLANT;  Surgeon: Lucas Mallow, MD;  Location: [facility];  Service: Urology;  Laterality: N/A;   INCISION AND DRAINAGE OF WOUND Left 08/28/2016   Procedure: IRRIGATION AND DEBRIDEMENT WOUND;  Surgeon: Iran Planas, MD;  Location: [place];  Service: Orthopedics;  Laterality: Left;   LIPOMA EXCISION Right 04/18/2010   right thigh   MASS EXCISION Left 07/2001   excision left axila mass (benign)   MELANOMA EXCISION  01/2000   @ [facility];   WLE of mid back for melanoma and excision resection lipoma upper back   METACARPAL OSTEOTOMY Left 08/28/2016   Procedure: 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.;  Surgeon: Iran Planas, MD;  Location: [place];  Service: Orthopedics;  Laterality: Left;   MICROLARYNGOSCOPY WITH LASER N/A 01/25/2015   Procedure: MICROLARYNGOSCOPY ;  Surgeon: Melida Quitter, MD;  Location: [place];  Service: ENT;  Laterality: N/A;  Microlaryngoscopy with 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 with CO2 laser    SATURATION BIOPSY OF PROSTATE  01/18/2005   '@[facility]'$   w/ anesthesia   SPACE OAR INSTILLATION N/A 06/27/2021   Procedure: SPACE OAR INSTILLATION;  Surgeon: Lucas Mallow, MD;  Location: [facility];  Service: Urology;  Laterality: N/A;   TENDON REPAIR Right 2012   right  involving elbow, arm. hand   TONSILLECTOMY  1970    Current Outpatient Medications  Medication Sig Dispense Refill   amLODipine (NORVASC) 10 MG tablet TAKE 1 TABLET BY MOUTH ONCE DAILY -PATIENT  NEEDS  APPOINTMENT 30 tablet 0   apixaban (ELIQUIS) 5 MG TABS tablet Take 1 tablet by mouth twice daily 180 tablet 1    carvedilol (COREG) 3.125 MG tablet Take 1 tablet (3.125 mg total) by mouth 2 (two) times daily. 180 tablet 3   Cholecalciferol (DIALYVITE VITAMIN D 5000) 125 MCG (5000 UT) capsule Take 5,000 Units by mouth daily.     flecainide (TAMBOCOR) 100 MG tablet Take 1 tablet (100 mg total) by mouth 2 (two) times daily. 180 tablet 3   fluticasone (FLONASE) 50 MCG/ACT nasal spray Place 1 spray into both nostrils at bedtime.     GLUCOSAMINE-CHONDROITIN PO Take 1 tablet by mouth daily.      hydrALAZINE (APRESOLINE) 25 MG tablet TAKE 1 TABLET BY MOUTH TWICE DAILY . APPOINTMENT REQUIRED FOR FUTURE REFILLS (Patient taking differently: Take 25 mg by mouth 2 (two) times daily.) 180 tablet 1   irbesartan (AVAPRO) 150 MG tablet  Take 1 tablet (150 mg total) by mouth daily. (Patient taking differently: Take 150 mg by mouth daily.) 90 tablet 3   Lutein 20 MG CAPS Take 20 mg by mouth daily.     Multiple Vitamin (MULTIVITAMIN) tablet Take 1 tablet by mouth daily.     OVER THE COUNTER MEDICATION Place 4-5 drops into both eyes as needed (dry eyes). Thera tears     psyllium (METAMUCIL) 58.6 % powder Take 1 packet by mouth at bedtime.     Saw Palmetto, Serenoa repens, (SAW PALMETTO PO) Take 1 tablet by mouth daily.     testosterone cypionate (DEPOTESTOSTERONE CYPIONATE) 200 MG/ML injection Inject 100 mg into the muscle every Wednesday.     valACYclovir (VALTREX) 1000 MG tablet Take 500 mg by mouth 3 (three) times daily.     triamcinolone (KENALOG) 0.1 % Apply 1 application topically 2 (two) times daily as needed (red bumps). (Patient not taking: Reported on 11/02/2021)     No current facility-administered medications for this encounter.    Allergies  Allergen Reactions   Contrast Media [Iodinated Contrast Media] Shortness Of Breath, Rash and Other (See Comments)    First noted after Coronary CTA 02/24/20. Pt experienced wheezing, shortness of breath and diffuse rash/welts over face and trunk.   Lisinopril Other (See  Comments)    REACTION: severe GI upset requiring EGD--temporally related to lisinopril--tolerated micardis    Social History   Socioeconomic History   Marital status: Married    Spouse name: Not on file   Number of children: 2   Years of education: Not on file   Highest education level: Not on file  Occupational History   Occupation: retired  Tobacco Use   Smoking status: Former    Years: 30.00    Types: Cigarettes    Start date: 11/20/1976    Quit date: 1986    Years since quitting: 37.7   Smokeless tobacco: Never  Vaping Use   Vaping Use: Never used  Substance and Sexual Activity   Alcohol use: Not Currently   Drug use: Never   Sexual activity: Not on file  Other Topics Concern   Not on file  Social History Narrative   Lives in [place].  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   Social Determinants of Health   Financial Resource Strain: Not on file  Food Insecurity: No Food Insecurity (10/08/2021)   Hunger Vital Sign    Worried About Running Out of Food in the Last Year: Never true    Ran Out of Food in the Last Year: Never true  Transportation Needs: No Transportation Needs (10/08/2021)   PRAPARE - Hydrologist (Medical): No    Lack of Transportation (Non-Medical): No  Physical Activity: Not on file  Stress: Not on file  Social Connections: Not on file  Intimate Partner Violence: Not on file     ROS- All systems are reviewed and negative except as per the HPI above.  Physical Exam: Vitals:   11/02/21 1021  BP: 126/60  Pulse: 81  Weight: 90.4 kg  Height: 6' (1.829 m)     GEN- The patient is a well appearing elderly male, alert and oriented x 3 today.   HEENT-head normocephalic, atraumatic, sclera clear, conjunctiva pink, hearing intact, trachea midline. Lungs- Clear to ausculation bilaterally, normal work of breathing Heart- Regular rate and rhythm, no murmurs, rubs or gallops  GI- soft, NT, ND, +  BS Extremities-  no clubbing, cyanosis, or edema MS- no significant deformity or atrophy Skin- no rash or lesion Psych- euthymic mood, full affect Neuro- strength and sensation are intact   Wt Readings from Last 3 Encounters:  11/02/21 90.4 kg  10/23/21 90.9 kg  06/27/21 90.9 kg    EKG today demonstrates  Atypical atrial flutter with 2:1 block Vent. rate 81 BPM PR interval 136 ms QRS duration 100 ms QT/QTcB 360/418 ms  Echo 02/17/20 demonstrated   1. Left ventricular ejection fraction, by estimation, is 60 to 65%. The  left ventricle has normal function. The left ventricle has no regional  wall motion abnormalities. There is mild concentric left ventricular  hypertrophy of the basal-septal segment. Left ventricular diastolic parameters are indeterminate.   2. Right ventricular systolic function is normal. The right ventricular  size is normal.   3. Left atrial size was moderately dilated.   4. Right atrial size was moderately dilated.   5. The mitral valve is normal in structure. Mild mitral valve  regurgitation.   6. The aortic valve is calcified. There is mild calcification of the  aortic valve. There is mild thickening of the aortic valve. Aortic valve  regurgitation is mild to moderate. Mild aortic valve sclerosis is present,  with no evidence of aortic valve stenosis.   7. The inferior vena cava is normal in size with greater than 50%  respiratory variability, suggesting right atrial pressure of 3 mmHg.   Comparison(s): A prior study was performed on 04/30/2018. Similar aortic regurgitation with subtle increase in left ventricular size. Ascending aorta not well visualized in this study for comparison; mital valve regurgitation not seen in prior study.   Epic records are reviewed at length today   CHA2DS2-VASc Score = 4  The patient's score is based upon: CHF History: 0 HTN History: 1 Diabetes History: 0 Stroke History: 0 Vascular Disease History: 1 Age Score:  2 Gender Score: 0      ASSESSMENT AND PLAN: 1. Persistent Atrial Fibrillation/atrial flutter The patient's CHA2DS2-VASc score is 4, indicating a 4.8% annual risk of stroke.   S/p afib and flutter ablation 03/02/20 Patient remains in atrial flutter today. Will arrange for DCCV.  Check bmet/cbc today. Continue Eliquis 5 mg BID Continue Coreg 3.125 mg BID Continue flecainide 100 mg BID Patient has appointment with Dr Myles Gip to discuss possible repeat ablation.   2. Secondary Hypercoagulable State (ICD10:  D68.69) The patient is at significant risk for stroke/thromboembolism based upon his CHA2DS2-VASc Score of 4.  Continue Apixaban (Eliquis).   3. Obstructive sleep apnea Encouraged compliance with CPAP therapy.  4. HTN Stable, no changes today.   Follow up with Dr Myles Gip as scheduled.    [facility] [address] [place], [place] [address] [phone] 11/02/2021 10:42 AM

## 2021-11-02 NOTE — Patient Instructions (Signed)
Cardioversion scheduled for Tuesday, September 26th   - Arrive at the [facility] and go to admitting at 830am  - Do not eat or drink anything after midnight the night prior to your procedure.  - Take all your morning medication (except diabetic medications) with a sip of water prior to arrival.  - You will not be able to drive home after your procedure.  - Do NOT miss any doses of your blood thinner - if you should miss a dose please notify our office immediately.  - If you feel as if you go back into normal rhythm prior to scheduled cardioversion, please notify our office immediately. If your procedure is canceled in the cardioversion suite you will be charged a cancellation fee.

## 2021-11-04 DIAGNOSIS — G4733 Obstructive sleep apnea (adult) (pediatric): Secondary | ICD-10-CM | POA: Diagnosis not present

## 2021-11-06 ENCOUNTER — Other Ambulatory Visit: Payer: Self-pay

## 2021-11-06 ENCOUNTER — Encounter (HOSPITAL_COMMUNITY): Payer: Self-pay | Admitting: Anesthesiology

## 2021-11-06 ENCOUNTER — Encounter (HOSPITAL_COMMUNITY): Payer: Self-pay | Admitting: Cardiology

## 2021-11-06 ENCOUNTER — Encounter (HOSPITAL_COMMUNITY)
Admission: RE | Disposition: A | Discharge status: Home / Self Care | Payer: Self-pay | Source: Home / Self Care | Attending: Cardiology

## 2021-11-06 ENCOUNTER — Ambulatory Visit (HOSPITAL_COMMUNITY)
Admission: RE | Admit: 2021-11-06 | Discharge: 2021-11-06 | Disposition: A | Discharge status: Home / Self Care | Payer: Medicare HMO | Attending: Cardiology | Admitting: Cardiology

## 2021-11-06 DIAGNOSIS — Z538 Procedure and treatment not carried out for other reasons: Secondary | ICD-10-CM | POA: Insufficient documentation

## 2021-11-06 DIAGNOSIS — I4891 Unspecified atrial fibrillation: Secondary | ICD-10-CM | POA: Insufficient documentation

## 2021-11-06 DIAGNOSIS — I447 Left bundle-branch block, unspecified: Secondary | ICD-10-CM | POA: Diagnosis not present

## 2021-11-06 DIAGNOSIS — I4892 Unspecified atrial flutter: Secondary | ICD-10-CM | POA: Insufficient documentation

## 2021-11-06 DIAGNOSIS — I4819 Other persistent atrial fibrillation: Secondary | ICD-10-CM

## 2021-11-06 SURGERY — CANCELLED PROCEDURE

## 2021-11-06 MED ORDER — SODIUM CHLORIDE 0.9 % IV SOLN: INTRAVENOUS | Status: DC

## 2021-11-06 NOTE — Progress Notes (Signed)
Patient arrived to ENDO and placed on monitor and appeared to be in NSR. EKG obtained and MD confirmed NSR. Patient discharged to home.

## 2021-11-06 NOTE — Anesthesia Preprocedure Evaluation (Signed)
Anesthesia Evaluation  Patient identified by MRN, date of birth, ID band Patient awake    Reviewed: Allergy & Precautions, NPO status , Patient's Chart, lab work & pertinent test results, reviewed documented beta blocker date and time   Airway Mallampati: II  TM Distance: >3 FB Neck ROM: Full    Dental no notable dental hx.    Pulmonary sleep apnea and Continuous Positive Airway Pressure Ventilation , former smoker,    Pulmonary exam normal breath sounds clear to auscultation       Cardiovascular hypertension, Pt. on medications and Pt. on home beta blockers + CAD  + dysrhythmias (last CV + ablation 02/2020, eliquis) Atrial Fibrillation + Valvular Problems/Murmurs (mild MR, mild-mod AI) AI and MR  Rhythm:Irregular Rate:Normal  Echo 2022 1. Left ventricular ejection fraction, by estimation, is 60 to 65%. The  left ventricle has normal function. The left ventricle has no regional  wall motion abnormalities. There is mild concentric left ventricular  hypertrophy of the basal-septal segment.  Left ventricular diastolic parameters are indeterminate.  2. Right ventricular systolic function is normal. The right ventricular  size is normal.  3. Left atrial size was moderately dilated.  4. Right atrial size was moderately dilated.  5. The mitral valve is normal in structure. Mild mitral valve  regurgitation.  6. The aortic valve is calcified. There is mild calcification of the  aortic valve. There is mild thickening of the aortic valve. Aortic valve  regurgitation is mild to moderate. Mild aortic valve sclerosis is present,  with no evidence of aortic valve  stenosis.  7. The inferior vena cava is normal in size with greater than 50%  respiratory variability, suggesting right atrial pressure of 3 mmHg.    Neuro/Psych negative neurological ROS  negative psych ROS   GI/Hepatic negative GI ROS, Neg liver ROS,   Endo/Other   negative endocrine ROS  Renal/GU Renal InsufficiencyRenal diseaseCr 1.53  negative genitourinary   Musculoskeletal negative musculoskeletal ROS (+)   Abdominal   Peds negative pediatric ROS (+)  Hematology negative hematology ROS (+)   Anesthesia Other Findings   Reproductive/Obstetrics negative OB ROS                             Anesthesia Physical Anesthesia Plan  ASA: 3  Anesthesia Plan: General   Post-op Pain Management:    Induction: Intravenous  PONV Risk Score and Plan: TIVA and Treatment may vary due to age or medical condition  Airway Management Planned: Natural Airway and Mask  Additional Equipment: None  Intra-op Plan:   Post-operative Plan:   Informed Consent:   Plan Discussed with:   Anesthesia Plan Comments: (Cancelled in preop- pt in NSR )        Anesthesia Quick Evaluation

## 2021-11-06 NOTE — H&P (Addendum)
Mario Barre, PA  Physician Assistant Electrophysiology Progress Notes     Signed Date of Service:  11/02/2021 10:30 AM  Related encounter: ATRIAL FIB OFFICE VISIT from 11/02/2021 in [place]   Signed                                                                                                                                                                                                                                                                                                                                                                                                Primary Care Physician: Marrian Salvage, [place] Primary Cardiologist: Dr Claiborne Billings Primary Electrophysiologist: Dr Myles Gip  Referring Physician: HeartCare triage     Mario [name] is a 81 y.o. male with a history of OSA, HTN, and atrial fibrillation who presents for follow up in the [facility].  The patient was initially diagnosed with atrial fibrillation 2016. He has been maintained on flecainide and Eliquis for a CHADS2VASC score of 4. He underwent afib and flutter ablation with Dr Rayann Heman on 03/02/20 and his flecainide was discontinued. He was seen in follow up 10/23/21 and was back in rate controlled atrial flutter. Flecainide was resumed.    On follow up today, patient reports that his SOB has improved but not resolved since starting flecainide. Still in atypical flutter today. No bleeding issues on anticoagulation.    Today, he denies symptoms of palpitations, chest pain, orthopnea, PND, lower extremity edema, presyncope, syncope, bleeding, or neurologic sequela. The patient is tolerating medications without difficulties and is otherwise without complaint today.  Atrial Fibrillation Risk Factors:   he does have symptoms or  diagnosis of sleep apnea. he is compliant with CPAP therapy. he does not have a history of rheumatic fever. he does not have a history of alcohol use.     he has a BMI of Body mass index is 27.04 kg/m.[name]    Filed Weights    11/02/21 1021  Weight: 90.4 kg           Family History  Problem Relation Age of Onset   Heart disease Mother     Stroke Father     Other Father          brain tumor-benign   Heart disease Sister     Heart disease Sister     Hypertension Sister     Colon cancer Neg Hx     Stomach cancer Neg Hx     Esophageal cancer Neg Hx     Pancreatic cancer Neg Hx     Liver disease Neg Hx          Atrial Fibrillation Management history:   Previous antiarrhythmic drugs: flecainide Previous cardioversions: 11/2014, 02/22/20 Previous ablations: afib and flutter 02/2020 CHADS2VASC score: 4 Anticoagulation history: Eliquis         Past Medical History:  Diagnosis Date   Anticoagulant long-term use      eliquis--- managed by cardiology   Arthritis      neck   Benign localized prostatic hyperplasia with lower urinary tract symptoms (LUTS)     Blister      06-25-2021  per pt has a small blister at end of tailbone from doing sit-up's, covers w/ band-aid   ED (erectile dysfunction)     First degree heart block     History of adenomatous polyp of colon     History of basal cell carcinoma (BCC) of skin      excision multiple area's   History of kidney stones     History of malignant melanoma of back 2000    mid back  s/p WLE 12/ 2011 stage 1, localized per pt and no recurrence   History of narrow angle glaucoma      per pt s/p laser both eyes no issues since approx 2006   History of squamous cell carcinoma of skin      excision multiple area's   Hypertension     Hypogonadism in male     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    urologist--- dr bell/  radiation oncology-- dr Tammi Klippel;  dx 02/ 2078m Gleason 3+4   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       per cardiac CT 02-24-2020   42m  OSA on CPAP      per pt uses nightly   [facility]     cardiologist--- dr t.Darene LamerkeClaiborne Billings  [place] and 12-01-2014;  03-02-2020  s/p afib ablation by Dr AlRayann Heman Seasonal allergies     Wears hearing aid in both ears           Past Surgical History:  Procedure Laterality Date   ATRIAL FIBRILLATION ABLATION N/A 03/02/2020    Procedure: ATRIAL FIBRILLATION ABLATION;  Surgeon: AlThompson GrayerMD;  [facility];  Service: Cardiovascular;  Laterality: N/A;   CARDIOVERSION N/A 12/01/2014    Procedure: CARDIOVERSION;  Surgeon: ThTroy SineMD;  [place] Service: Cardiovascular;  Laterality: N/A;   CARDIOVERSION N/A 02/22/2020  Procedure: CARDIOVERSION;  Surgeon: Buford Dresser, MD;  Location: [facility] ENDOSCOPY;  Service: Cardiovascular;  Laterality: N/A;   CATARACT EXTRACTION W/ INTRAOCULAR LENS IMPLANT Bilateral      2000; 2004   COLONOSCOPY WITH ESOPHAGOGASTRODUODENOSCOPY (EGD)   09/2019   CYSTOSCOPY W/ URETEROSCOPY W/ LITHOTRIPSY   05/12/2003    '@[facility]'$    ELBOW SURGERY Left 1984    tendon repair left arm   GOLD SEED IMPLANT N/A 06/27/2021    Procedure: GOLD SEED IMPLANT;  Surgeon: Lucas Mallow, MD;  [facility];  Service: Urology;  Laterality: N/A;   INCISION AND DRAINAGE OF WOUND Left 08/28/2016    Procedure: IRRIGATION AND DEBRIDEMENT WOUND;  Surgeon: Iran Planas, MD;  Location: [place];  Service: Orthopedics;  Laterality: Left;   LIPOMA EXCISION Right 04/18/2010    right thigh   MASS EXCISION Left 07/2001    excision left axila mass (benign)   MELANOMA EXCISION   01/2000    @ [facility];   WLE of mid back for melanoma and excision resection lipoma upper back   METACARPAL OSTEOTOMY Left 08/28/2016    Procedure: 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;  Service: Urology;  Laterality: N/A;   TENDON REPAIR Right 2012    right  involving elbow, arm. hand   TONSILLECTOMY   1970            Current Outpatient Medications  Medication Sig Dispense Refill   amLODipine (NORVASC) 10 MG tablet TAKE 1 TABLET BY MOUTH ONCE DAILY -PATIENT  NEEDS  APPOINTMENT 30 tablet 0   apixaban (ELIQUIS) 5 MG TABS tablet Take 1 tablet by mouth twice daily 180 tablet 1   carvedilol (COREG) 3.125 MG tablet Take 1 tablet (3.125 mg total) by mouth 2 (two) times daily. 180 tablet 3   Cholecalciferol (DIALYVITE VITAMIN D 5000) 125 MCG (5000 UT) capsule Take 5,000 Units by mouth daily.       flecainide (TAMBOCOR) 100 MG tablet Take 1 tablet (100 mg total) by mouth 2 (two) times daily. 180 tablet 3   fluticasone (FLONASE) 50 MCG/ACT nasal spray Place 1 spray into both nostrils at bedtime.       GLUCOSAMINE-CHONDROITIN PO Take 1 tablet by mouth daily.        hydrALAZINE (APRESOLINE) 25 MG tablet TAKE 1 TABLET BY MOUTH TWICE DAILY . APPOINTMENT REQUIRED FOR FUTURE REFILLS (Patient taking differently: Take 25 mg by mouth 2 (two) times daily.) 180 tablet 1   irbesartan (AVAPRO) 150 MG tablet Take 1 tablet (150 mg total) by mouth daily. (Patient taking differently: Take 150 mg by mouth daily.) 90 tablet 3   Lutein 20 MG CAPS Take 20 mg by mouth daily.       Multiple Vitamin (MULTIVITAMIN) tablet Take 1 tablet by mouth daily.       OVER THE COUNTER MEDICATION Place 4-5 drops into both eyes as needed (dry eyes).  Thera tears       psyllium  (METAMUCIL) 58.6 % powder Take 1 packet by mouth at bedtime.       Saw Palmetto, Serenoa repens, (SAW PALMETTO PO) Take 1 tablet by mouth daily.       testosterone cypionate (DEPOTESTOSTERONE CYPIONATE) 200 MG/ML injection Inject 100 mg into the muscle every Wednesday.       valACYclovir (VALTREX) 1000 MG tablet Take 500 mg by mouth 3 (three) times daily.       triamcinolone (KENALOG) 0.1 % Apply 1 application topically 2 (two) times daily as needed (red bumps). (Patient not taking: Reported on 11/02/2021)        No current facility-administered medications for this encounter.           Allergies  Allergen Reactions   Contrast Media [Iodinated Contrast Media] Shortness Of Breath, Rash and Other (See Comments)      First noted after Coronary CTA 02/24/20. Pt experienced wheezing, shortness of breath and diffuse rash/welts over face and trunk.   Lisinopril Other (See Comments)      REACTION: severe GI upset requiring EGD--temporally related to lisinopril--tolerated micardis      Social History         Socioeconomic History   Marital status: Married      Spouse name: Not on file   Number of children: 2   Years of education: Not on file   Highest education level: Not on file  Occupational History   Occupation: retired  Tobacco Use   Smoking status: Former      Years: 30.00      Types: Cigarettes      Start date: 11/20/1976      Quit date: 1986      Years since quitting: 37.7   Smokeless tobacco: Never  Vaping Use   Vaping Use: Never used  Substance and Sexual Activity   Alcohol use: Not Currently   Drug use: Never   Sexual activity: Not on file  Other Topics Concern   Not on file  Social History Narrative    Lives in [place].  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    Social Determinants of Health        Financial Resource Strain: Not on file  Food Insecurity: No Food Insecurity (10/08/2021)    Hunger Vital Sign     Worried About Running Out of  Food in the Last Year: Never true     Ran Out of Food in the Last Year: Never true  Transportation Needs: No Transportation Needs (10/08/2021)    PRAPARE - Armed forces logistics/support/administrative officer (Medical): No     Lack of Transportation (Non-Medical): No  Physical Activity: Not on file  Stress: Not on file  Social Connections: Not on file  Intimate Partner Violence: Not on file        ROS- All systems are reviewed and negative except as per the HPI above.   Physical Exam:    Vitals:    11/02/21 1021  BP: 126/60  Pulse: 81  Weight: 90.4 kg  Height: 6' (1.829 m)        GEN- The patient is a well appearing elderly male, alert and oriented x 3 today.   HEENT-head normocephalic, atraumatic, sclera clear, conjunctiva pink, hearing intact, trachea midline. Lungs- Clear to ausculation bilaterally, normal work of breathing Heart- Regular rate and rhythm, no murmurs, rubs or gallops  GI- soft, NT, ND, +  BS Extremities- no clubbing, cyanosis, or edema MS- no significant deformity or atrophy Skin- no rash or lesion Psych- euthymic mood, full affect Neuro- strength and sensation are intact        Wt Readings from Last 3 Encounters:  11/02/21 90.4 kg  10/23/21 90.9 kg  06/27/21 90.9 kg      EKG today demonstrates  Atypical atrial flutter with 2:1 block Vent. rate 81 BPM PR interval 136 ms QRS duration 100 ms QT/QTcB 360/418 ms   Echo 02/17/20 demonstrated   1. Left ventricular ejection fraction, by estimation, is 60 to 65%. The  left ventricle has normal function. The left ventricle has no regional  wall motion abnormalities. There is mild concentric left ventricular  hypertrophy of the basal-septal segment. Left ventricular diastolic parameters are indeterminate.   2. Right ventricular systolic function is normal. The right ventricular  size is normal.   3. Left atrial size was moderately dilated.   4. Right atrial size was moderately dilated.   5. The mitral valve is  normal in structure. Mild mitral valve  regurgitation.   6. The aortic valve is calcified. There is mild calcification of the  aortic valve. There is mild thickening of the aortic valve. Aortic valve  regurgitation is mild to moderate. Mild aortic valve sclerosis is present,  with no evidence of aortic valve stenosis.   7. The inferior vena cava is normal in size with greater than 50%  respiratory variability, suggesting right atrial pressure of 3 mmHg.   Comparison(s): A prior study was performed on 04/30/2018. Similar aortic regurgitation with subtle increase in left ventricular size. Ascending aorta not well visualized in this study for comparison; mital valve regurgitation not seen in prior study.    Epic records are reviewed at length today     CHA2DS2-VASc Score = 4  The patient's score is based upon: CHF History: 0 HTN History: 1 Diabetes History: 0 Stroke History: 0 Vascular Disease History: 1 Age Score: 2 Gender Score: 0        ASSESSMENT AND PLAN: 1. Persistent Atrial Fibrillation/atrial flutter The patient's CHA2DS2-VASc score is 4, indicating a 4.8% annual risk of stroke.   S/p afib and flutter ablation 03/02/20 Patient remains in atrial flutter today. Will arrange for DCCV.  Check bmet/cbc today. Continue Eliquis 5 mg BID Continue Coreg 3.125 mg BID Continue flecainide 100 mg BID Patient has appointment with Dr Myles Gip to discuss possible repeat ablation.    2. Secondary Hypercoagulable State (ICD10:  D68.69) The patient is at significant risk for stroke/thromboembolism based upon his CHA2DS2-VASc Score of 4.  Continue Apixaban (Eliquis).    3. Obstructive sleep apnea Encouraged compliance with CPAP therapy.   4. HTN Stable, no changes today.     Follow up with Dr Myles Gip as scheduled.      [facility] [address] [place], [place] [address] [phone] 11/02/2021 10:42 AM        For DCCV; pt in sinus with  LBBB; procedure canceled; continue present meds. Kirk Ruths  Reviewed ECG with Dr Quentin Ore; given new LBBB following initiation of flecanide, there is concerning for progression to heart block; left message for pt to DC flecanide. Will have pt FU in clinic in approximately 2 weeks.  Kirk Ruths

## 2021-11-07 ENCOUNTER — Telehealth: Payer: Self-pay | Admitting: *Deleted

## 2021-11-07 NOTE — Telephone Encounter (Signed)
Spoke with pt, per dr Stanford Breed, after discussing with EP, the patient needs to stop flecainide. Patient aware to keep follow up appointment as scheduled for discussion of further options.

## 2021-11-12 ENCOUNTER — Other Ambulatory Visit: Payer: Self-pay | Admitting: Internal Medicine

## 2021-11-12 ENCOUNTER — Other Ambulatory Visit: Payer: Self-pay | Admitting: Cardiovascular Disease

## 2021-11-15 DIAGNOSIS — L03213 Periorbital cellulitis: Secondary | ICD-10-CM | POA: Diagnosis not present

## 2021-11-15 DIAGNOSIS — H10022 Other mucopurulent conjunctivitis, left eye: Secondary | ICD-10-CM | POA: Diagnosis not present

## 2021-11-15 DIAGNOSIS — R0981 Nasal congestion: Secondary | ICD-10-CM | POA: Diagnosis not present

## 2021-11-20 ENCOUNTER — Telehealth: Payer: Self-pay | Admitting: Cardiology

## 2021-11-23 DIAGNOSIS — C61 Malignant neoplasm of prostate: Secondary | ICD-10-CM | POA: Diagnosis not present

## 2021-11-30 DIAGNOSIS — E291 Testicular hypofunction: Secondary | ICD-10-CM | POA: Diagnosis not present

## 2021-11-30 DIAGNOSIS — C61 Malignant neoplasm of prostate: Secondary | ICD-10-CM | POA: Diagnosis not present

## 2021-11-30 DIAGNOSIS — N401 Enlarged prostate with lower urinary tract symptoms: Secondary | ICD-10-CM | POA: Diagnosis not present

## 2021-11-30 DIAGNOSIS — R3912 Poor urinary stream: Secondary | ICD-10-CM | POA: Diagnosis not present

## 2021-11-30 DIAGNOSIS — R351 Nocturia: Secondary | ICD-10-CM | POA: Diagnosis not present

## 2021-11-30 DIAGNOSIS — N5201 Erectile dysfunction due to arterial insufficiency: Secondary | ICD-10-CM | POA: Diagnosis not present

## 2021-12-04 ENCOUNTER — Ambulatory Visit: Payer: Medicare HMO | Attending: Cardiovascular Disease | Admitting: Cardiovascular Disease

## 2021-12-04 ENCOUNTER — Encounter: Payer: Self-pay | Admitting: Cardiovascular Disease

## 2021-12-04 VITALS — BP 112/60 | HR 66 | Ht 72.0 in | Wt 201.0 lb

## 2021-12-04 DIAGNOSIS — I484 Atypical atrial flutter: Secondary | ICD-10-CM

## 2021-12-04 DIAGNOSIS — I480 Paroxysmal atrial fibrillation: Secondary | ICD-10-CM

## 2021-12-04 DIAGNOSIS — G4733 Obstructive sleep apnea (adult) (pediatric): Secondary | ICD-10-CM | POA: Diagnosis not present

## 2021-12-04 MED ORDER — DIPHENHYDRAMINE HCL 50 MG PO TABS: 50.0000 mg | ORAL_TABLET | Freq: Once | ORAL | 0 refills | Status: DC

## 2021-12-04 MED ORDER — PREDNISONE 50 MG PO TABS: ORAL_TABLET | ORAL | 0 refills | Status: DC

## 2021-12-04 NOTE — Progress Notes (Signed)
Cardiology Office Note:    Date:  12/04/2021   ID:  Mario [name], DOB Jun 18, 1940, MRN [id number]  PCP:  Marrian Salvage, [place] [facility] Cardiologist:  Shelva Majestic, MD Electrophysiologist:  Melida Quitter, MD     Referring MD: Marrian Salvage,*   Chief complaint: fatigue, shortness of breath  History of Present Illness:    Mario [name] is a 81 y.o. male with a hx of sleep apnea, hypertension, atrial fibrillation who presents for electrophysiology follow-up.  He was diagnosed with atrial fibrillation in 2016 and maintained on flecainide.  He has been on Eliquis for CHA2DS2-VASc score of 4.  He is a former patient of Dr. Rayann Heman, who performed a fib and flutter ablation in January 2022 after he failed flecainide.  He was noted to have recurrence of atrial flutter in September 2023, so flecainide was resumed.  In follow-up later in the month, the patient remained in atypical flutter.  DC cardioversion was scheduled for 11/06/2021. He appeared to be in sinus rhythm with LBBB at the time.  He reports that he was very fatigued and shortness of breath while on flecainide.  He still is more fatigued and short of breath than he has at baseline, back in atypical flutter today with narrow QRS.    Past Medical History:  Diagnosis Date   Anticoagulant long-term use    eliquis--- managed by cardiology   Arthritis    neck   Benign localized prostatic hyperplasia with lower urinary tract symptoms (LUTS)    Blister    06-25-2021  per pt has a small blister at end of tailbone from doing sit-up's, covers w/ band-aid   ED (erectile dysfunction)    First degree heart block    History of adenomatous polyp of colon    History of basal cell carcinoma (BCC) of skin    excision multiple area's   History of kidney stones    History of malignant melanoma of back 2000   mid back  s/p WLE 12/ 2011 stage 1, localized per pt and no recurrence   History of narrow angle  glaucoma    per pt s/p laser both eyes no issues since approx 2006   History of squamous cell carcinoma of skin    excision multiple area's   Hypertension    Hypogonadism in male    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   urologist--- dr bell/  radiation oncology-- dr Tammi Klippel;  dx 02/ 2054m Gleason 3+4   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 ([facility]    per cardiac CT 02-24-2020   448m  OSA on CPAP    per pt uses nightly   [facility]   [facility]ologist--- dr t.Darene LamerkeClaiborne Billings  [place] and 12-01-2014;  03-02-2020  s/p afib ablation by Dr AlRayann Heman Seasonal allergies    Wears hearing aid in both ears     Past Surgical History:  Procedure Laterality Date   ATRIAL FIBRILLATION ABLATION N/A 03/02/2020   Procedure: ATRIAL FIBRILLATION ABLATION;  Surgeon: AlThompson GrayerMD;  [facility];  Service: Cardiovascular;  Laterality: N/A;   CARDIOVERSION N/A 12/01/2014   Procedure: CARDIOVERSION;  Surgeon: ThTroy SineMD;  [facility] Service: Cardiovascular;  Laterality: N/A;   CARDIOVERSION N/A 02/22/2020   Procedure: CARDIOVERSION;  Surgeon: ChBuford DresserMD;  [facility] Service: Cardiovascular;  Laterality: N/A;   CATARACT EXTRACTION W/ INTRAOCULAR LENS IMPLANT Bilateral  2000; 2004   COLONOSCOPY WITH ESOPHAGOGASTRODUODENOSCOPY (EGD)  09/2019   CYSTOSCOPY W/ URETEROSCOPY W/ LITHOTRIPSY  05/12/2003   '@[facility]'$    ELBOW SURGERY Left 1984   tendon repair left arm   GOLD SEED IMPLANT N/A 06/27/2021   Procedure: GOLD SEED IMPLANT;  Surgeon: Lucas Mallow, MD;  Location: [facility];  Service: Urology;  Laterality: N/A;   INCISION AND DRAINAGE OF WOUND Left 08/28/2016   Procedure: IRRIGATION AND DEBRIDEMENT WOUND;  Surgeon: Iran Planas, MD;  Location: [place];  Service: Orthopedics;  Laterality: Left;   LIPOMA EXCISION Right 04/18/2010   right thigh   MASS EXCISION Left 07/2001   excision left axila  mass (benign)   MELANOMA EXCISION  01/2000   @ [facility];   WLE of mid back for melanoma and excision resection lipoma upper back   METACARPAL OSTEOTOMY Left 08/28/2016   Procedure: 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.;  Surgeon: Iran Planas, MD;  Location: [place];  Service: Orthopedics;  Laterality: Left;   MICROLARYNGOSCOPY WITH LASER N/A 01/25/2015   Procedure: MICROLARYNGOSCOPY ;  Surgeon: Melida Quitter, MD;  Location: [place];  Service: ENT;  Laterality: N/A;  Microlaryngoscopy with 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 hand   TONSILLECTOMY  1970    Current Medications: Current Meds  Medication Sig   amLODipine (NORVASC) 10 MG tablet Take 1 tablet (10 mg total) by mouth daily.   apixaban (ELIQUIS) 5 MG TABS tablet Take 1 tablet by mouth twice daily   Carboxymethylcellulose Sodium (THERATEARS PF OP) Place 1 drop into both eyes daily as needed (dry eyes).   carvedilol (COREG) 3.125 MG tablet Take 1 tablet (3.125 mg total) by mouth 2 (two) times daily.   fluticasone (FLONASE) 50 MCG/ACT nasal spray Place 2 sprays into both nostrils at bedtime as needed for allergies.   GLUCOSAMINE-CHONDROITIN PO Take 1 tablet by mouth daily.    hydrALAZINE (APRESOLINE) 25 MG tablet TAKE 1 TABLET BY MOUTH TWICE DAILY APPOINTMENT  REQUIRED  FOR  FURTHER  REFILLS   irbesartan (AVAPRO) 150 MG tablet Take 1 tablet (150 mg total) by mouth daily.   Lutein 20 MG CAPS Take 20 mg by mouth 4 (four) times a week.   METAMUCIL FIBER PO Take 1 Dose by mouth daily.   Multiple Vitamin (MULTIVITAMIN) tablet Take 1 tablet by mouth daily.   Saw Palmetto, Serenoa repens, (SAW PALMETTO PO) Take 2  tablets by mouth daily.   testosterone cypionate (DEPOTESTOSTERONE CYPIONATE) 200 MG/ML injection Inject 100 mg into the muscle every Wednesday.     Allergies:   Contrast media [iodinated contrast media] and Lisinopril   Social History   Socioeconomic History   Marital status: Married    Spouse name: Not on file   Number of children: 2   Years of education: Not on file   Highest education level: Not on file  Occupational History   Occupation: retired  Tobacco Use   Smoking status: Former    Years: 30.00    Types: Cigarettes    Start date: 11/20/1976    Quit date: 1986    Years since  quitting: 37.8   Smokeless tobacco: Never  Vaping Use   Vaping Use: Never used  Substance and Sexual Activity   Alcohol use: Not Currently   Drug use: Never   Sexual activity: Not on file  Other Topics Concern   Not on file  Social History Narrative   Lives in [place].  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   Social Determinants of Health   Financial Resource Strain: Not on file  Food Insecurity: No Food Insecurity (10/08/2021)   Hunger Vital Sign    Worried About Running Out of Food in the Last Year: Never true    Ran Out of Food in the Last Year: Never true  Transportation Needs: No Transportation Needs (10/08/2021)   PRAPARE - Hydrologist (Medical): No    Lack of Transportation (Non-Medical): No  Physical Activity: Not on file  Stress: Not on file  Social Connections: Not on file     Family History: The patient's family history includes Heart disease in his mother, sister, and sister; Hypertension in his sister; Other in his father; Stroke in his father. There is no history of Colon cancer, Stomach cancer, Esophageal cancer, Pancreatic cancer, or Liver disease.  ROS:   Please see the history of present illness.    All other systems reviewed and are negative.  EKGs/Labs/Other Studies Reviewed:      EKG:  Last EKG results:  atypical flutter, V-rate 66 bpm   Recent Labs: 03/30/2021: ALT 22 11/02/2021: BUN 26; Creatinine, Ser 1.53; Hemoglobin 14.6; Platelets 176; Potassium 4.4; Sodium 134     Physical Exam:    VS:  BP 112/60 (BP Location: Left Arm, Patient Position: Sitting, Cuff Size: Normal)   Pulse 66   Ht 6' (1.829 m)   Wt 201 lb (91.2 kg)   SpO2 96%   BMI 27.26 kg/m     Wt Readings from Last 3 Encounters:  12/04/21 201 lb (91.2 kg)  11/06/21 200 lb (90.7 kg)  11/02/21 199 lb 6.4 oz (90.4 kg)     GEN: Well nourished, well developed in no acute distress CARDIAC: RRR, no murmurs, rubs, gallops RESPIRATORY:  Normal work of breathing MUSCULOSKELETAL: no edema    ASSESSMENT & PLAN:    Atypical atrial flutter: recurred after discontinuation of flecainide. I recommended ablation. We discussed the indication, rationale, logistics, anticipated benefits, and potential risks of the ablation procedure including but not limited to -- bleed at the groin access site, chest pain, damage to nearby organs such as the diaphragm, lungs, or esophagus, need for a drainage tube, or prolonged hospitalization. I explained that the risk for stroke, heart attack, need for open chest surgery, or even death is very low but not zero. He expressed understanding and wishes to proceed.  Atrial fibrillation: will assess prior PVI at time of flutter ablation if left atrium is accessed High risk medication: flecainide discontinued due to LBBB with 1st degree AV block Secondary hypercoagulable state: due to AF. Continue eliquis          Medication Adjustments/Labs and Tests Ordered: Current medicines are reviewed at length with the patient today.  Concerns regarding medicines are outlined above.  No orders of the defined types were placed in this encounter.  No orders of the defined types were placed in this encounter.    Signed, Melida Quitter, MD  12/04/2021 10:15 AM    [place]

## 2021-12-04 NOTE — Patient Instructions (Signed)
Medication Instructions:  Your physician recommends that you continue on your current medications as directed. Please refer to the Current Medication list given to you today.  *If you need a refill on your cardiac medications before your next appointment, please call your pharmacy*  Lab Work: TODAY: BMET and CBC If you have labs (blood work) drawn today and your tests are completely normal, you will receive your results only by: [place] (if you have MyChart) OR A paper copy in the mail If you have any lab test that is abnormal or we need to change your treatment, we will call you to review the results.  Testing/Procedures: Your physician has requested that you have cardiac CT. Cardiac computed tomography (CT) is a painless test that uses an x-ray machine to take clear, detailed pictures of your heart. For further information please visit [email or web address]. Please follow instruction sheet as given.  Your physician has recommended that you have an ablation. Catheter ablation is a medical procedure used to treat some cardiac arrhythmias (irregular heartbeats). During catheter ablation, a long, thin, flexible tube is put into a blood vessel in your groin (upper thigh), or neck. This tube is called an ablation catheter. It is then guided to your heart through the blood vessel. Radio frequency waves destroy small areas of heart tissue where abnormal heartbeats may cause an arrhythmia to start. Please see the instruction sheet given to you today.  Follow-Up: At [facility], you and your health needs are our priority.  As part of our continuing mission to provide you with exceptional heart care, we have created designated Provider Care Teams.  These Care Teams include your primary Cardiologist (physician) and Advanced Practice Providers (APPs -  Physician Assistants and Nurse Practitioners) who all work together to provide you with the care you need, when you need it.  Your next  appointment:   See instruction letter  Important Information About [employer]

## 2021-12-04 NOTE — Addendum Note (Signed)
Addended by: Bernestine Amass on: 12/04/2021 10:57 AM   Modules accepted: Orders

## 2021-12-05 ENCOUNTER — Telehealth: Payer: Self-pay

## 2021-12-05 DIAGNOSIS — I10 Essential (primary) hypertension: Secondary | ICD-10-CM

## 2021-12-05 LAB — CBC WITH DIFFERENTIAL/PLATELET
Basophils Absolute: 0.1 10*3/uL (ref 0.0–0.2)
Basos: 1 %
EOS (ABSOLUTE): 0.2 10*3/uL (ref 0.0–0.4)
Eos: 3 %
Hematocrit: 45.2 % (ref 37.5–51.0)
Hemoglobin: 15.4 g/dL (ref 13.0–17.7)
Immature Grans (Abs): 0.1 10*3/uL (ref 0.0–0.1)
Immature Granulocytes: 1 %
Lymphocytes Absolute: 1 10*3/uL (ref 0.7–3.1)
Lymphs: 17 %
MCH: 32.6 pg (ref 26.6–33.0)
MCHC: 34.1 g/dL (ref 31.5–35.7)
MCV: 96 fL (ref 79–97)
Monocytes Absolute: 0.8 10*3/uL (ref 0.1–0.9)
Monocytes: 14 %
Neutrophils Absolute: 3.7 10*3/uL (ref 1.4–7.0)
Neutrophils: 64 %
Platelets: 201 10*3/uL (ref 150–450)
RBC: 4.72 x10E6/uL (ref 4.14–5.80)
RDW: 12.5 % (ref 11.6–15.4)
WBC: 5.7 10*3/uL (ref 3.4–10.8)

## 2021-12-05 LAB — BASIC METABOLIC PANEL
BUN/Creatinine Ratio: 19 (ref 10–24)
BUN: 28 mg/dL — ABNORMAL HIGH (ref 8–27)
CO2: 24 mmol/L (ref 20–29)
Calcium: 9.8 mg/dL (ref 8.6–10.2)
Chloride: 103 mmol/L (ref 96–106)
Creatinine, Ser: 1.48 mg/dL — ABNORMAL HIGH (ref 0.76–1.27)
Glucose: 81 mg/dL (ref 70–99)
Potassium: 5.2 mmol/L (ref 3.5–5.2)
Sodium: 142 mmol/L (ref 134–144)
eGFR: 47 mL/min/{1.73_m2} — ABNORMAL LOW (ref >59)

## 2021-12-05 MED ORDER — IRBESARTAN 75 MG PO TABS: 75.0000 mg | ORAL_TABLET | Freq: Every day | ORAL | 3 refills | Status: DC

## 2021-12-05 NOTE — Telephone Encounter (Signed)
The patient's wife has been notified of the result and verbalized understanding.  All questions (if any) were answered. Bernestine Amass, RN 12/05/2021 12:05 PM   Patient will decrease irbesartan to 75 mg daily. Repeat labs have been scheduled.

## 2021-12-05 NOTE — Telephone Encounter (Signed)
-----   Message from Melida Quitter, MD sent at 12/05/2021  7:34 AM EDT ----- He appears to have AKI or relatively new CKD. Will decrease ARB and recheck Cr.  Carly - when is the ablation scheduled for? Let's decrease irbesartan by half and recheck renal function in about a week. 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

## 2021-12-13 ENCOUNTER — Ambulatory Visit: Payer: Medicare HMO | Attending: Cardiology

## 2021-12-13 DIAGNOSIS — I10 Essential (primary) hypertension: Secondary | ICD-10-CM

## 2021-12-13 LAB — BASIC METABOLIC PANEL
BUN/Creatinine Ratio: 13 (ref 10–24)
BUN: 17 mg/dL (ref 8–27)
CO2: 25 mmol/L (ref 20–29)
Calcium: 9.3 mg/dL (ref 8.6–10.2)
Chloride: 102 mmol/L (ref 96–106)
Creatinine, Ser: 1.28 mg/dL — ABNORMAL HIGH (ref 0.76–1.27)
Glucose: 93 mg/dL (ref 70–99)
Potassium: 4.5 mmol/L (ref 3.5–5.2)
Sodium: 138 mmol/L (ref 134–144)
eGFR: 56 mL/min/{1.73_m2} — ABNORMAL LOW (ref >59)

## 2021-12-23 ENCOUNTER — Other Ambulatory Visit: Payer: Self-pay | Admitting: Cardiovascular Disease

## 2021-12-23 DIAGNOSIS — I10 Essential (primary) hypertension: Secondary | ICD-10-CM

## 2021-12-24 ENCOUNTER — Telehealth (HOSPITAL_COMMUNITY): Payer: Self-pay | Admitting: Emergency Medicine

## 2021-12-24 NOTE — Telephone Encounter (Signed)
Reaching out to patient to offer assistance regarding upcoming cardiac imaging study; pt verbalizes understanding of appt date/time, parking situation and where to check in, pre-test NPO status and medications ordered, and verified current allergies; name and call back number provided for further questions should they arise Mario Bond RN Navigator Cardiac Imaging [name] [phone] office [phone] cell  Reviewed 13 hr prep times (9p, 3a, 9am) Denies Iv issues

## 2021-12-24 NOTE — Telephone Encounter (Signed)
Attempted to call patient regarding upcoming cardiac CT appointment. °Left message on voicemail with name and callback number °Chela Sutphen RN Navigator Cardiac Imaging °[facility] °[phone] Office °[phone] Cell ° °

## 2021-12-25 ENCOUNTER — Ambulatory Visit (HOSPITAL_BASED_OUTPATIENT_CLINIC_OR_DEPARTMENT_OTHER)
Admission: RE | Admit: 2021-12-25 | Discharge: 2021-12-25 | Disposition: A | Discharge status: Home / Self Care | Payer: Medicare HMO | Source: Ambulatory Visit | Attending: Cardiovascular Disease | Admitting: Cardiovascular Disease

## 2021-12-25 ENCOUNTER — Encounter (HOSPITAL_BASED_OUTPATIENT_CLINIC_OR_DEPARTMENT_OTHER): Payer: Self-pay

## 2021-12-25 DIAGNOSIS — I484 Atypical atrial flutter: Secondary | ICD-10-CM | POA: Insufficient documentation

## 2021-12-25 DIAGNOSIS — I480 Paroxysmal atrial fibrillation: Secondary | ICD-10-CM | POA: Diagnosis not present

## 2021-12-25 MED ORDER — IOHEXOL 350 MG/ML SOLN
100.0000 mL | Freq: Once | INTRAVENOUS | Status: AC | PRN
  Administered 2021-12-25: 80 mL via INTRAVENOUS

## 2021-12-26 ENCOUNTER — Telehealth: Payer: Self-pay

## 2021-12-26 DIAGNOSIS — H52203 Unspecified astigmatism, bilateral: Secondary | ICD-10-CM | POA: Diagnosis not present

## 2021-12-26 DIAGNOSIS — H01005 Unspecified blepharitis left lower eyelid: Secondary | ICD-10-CM | POA: Diagnosis not present

## 2021-12-26 DIAGNOSIS — I3139 Other pericardial effusion (noninflammatory): Secondary | ICD-10-CM

## 2021-12-26 DIAGNOSIS — Z961 Presence of intraocular lens: Secondary | ICD-10-CM | POA: Diagnosis not present

## 2021-12-26 DIAGNOSIS — H01002 Unspecified blepharitis right lower eyelid: Secondary | ICD-10-CM | POA: Diagnosis not present

## 2021-12-26 NOTE — Telephone Encounter (Signed)
Spoke with the patient in regards to his CT results. Per Dr. Myles Gip patient will need to have an echocardiogram done and his ablation rescheduled.  Echocardiogram has been ordered. Will reschedule ablation once echocardiogram has been completed.

## 2021-12-26 NOTE — Telephone Encounter (Signed)
-----   Message from Melida Quitter, MD sent at 12/26/2021  8:14 AM EST ----- Can you arrange for him to get a TTE next available? Needs to be done before the ablation. ----- Message ----- From: Interface, Rad Results In Sent: 12/25/2021   4:54 PM EST To: Melida Quitter, MD

## 2021-12-28 NOTE — Telephone Encounter (Signed)
Echo has been scheduled for 11/27.  Left message for patient to call back to reschedule his ablation.

## 2021-12-28 NOTE — Telephone Encounter (Signed)
Spoke with the patient's wife and have rescheduled his ablation for 12/7.

## 2022-01-01 ENCOUNTER — Encounter (HOSPITAL_COMMUNITY): Admission: RE | Payer: Self-pay | Source: Home / Self Care

## 2022-01-01 ENCOUNTER — Ambulatory Visit (HOSPITAL_COMMUNITY): Admission: RE | Admit: 2022-01-01 | Payer: Medicare HMO | Source: Home / Self Care | Admitting: Cardiovascular Disease

## 2022-01-01 SURGERY — A-FLUTTER ABLATION
Anesthesia: General

## 2022-01-04 DIAGNOSIS — G4733 Obstructive sleep apnea (adult) (pediatric): Secondary | ICD-10-CM | POA: Diagnosis not present

## 2022-01-07 ENCOUNTER — Ambulatory Visit (HOSPITAL_COMMUNITY)
Admission: RE | Admit: 2022-01-07 | Discharge: 2022-01-07 | Disposition: A | Discharge status: Home / Self Care | Payer: Medicare HMO | Source: Ambulatory Visit | Attending: Cardiovascular Disease | Admitting: Cardiovascular Disease

## 2022-01-07 DIAGNOSIS — I4891 Unspecified atrial fibrillation: Secondary | ICD-10-CM | POA: Insufficient documentation

## 2022-01-07 DIAGNOSIS — I110 Hypertensive heart disease with heart failure: Secondary | ICD-10-CM | POA: Insufficient documentation

## 2022-01-07 DIAGNOSIS — I351 Nonrheumatic aortic (valve) insufficiency: Secondary | ICD-10-CM | POA: Diagnosis not present

## 2022-01-07 DIAGNOSIS — I517 Cardiomegaly: Secondary | ICD-10-CM | POA: Insufficient documentation

## 2022-01-07 DIAGNOSIS — I3139 Other pericardial effusion (noninflammatory): Secondary | ICD-10-CM | POA: Insufficient documentation

## 2022-01-08 LAB — ECHOCARDIOGRAM COMPLETE
P 1/2 time: 629 msec
S' Lateral: 3.2 cm

## 2022-01-16 NOTE — Pre-Procedure Instructions (Signed)
Attempted to call patient regarding procedure instructions.  Left voice mail on the following items: Arrival time 1130 Nothing to eat or drink after midnight No meds AM of procedure Responsible person to drive you home and stay with you for 24 hrs  Have you missed any doses of anti-coagulant Eliquis- if you have missed any doses please let office know right away

## 2022-01-17 ENCOUNTER — Other Ambulatory Visit: Payer: Self-pay

## 2022-01-17 ENCOUNTER — Ambulatory Visit (HOSPITAL_COMMUNITY): Payer: Medicare HMO | Admitting: Anesthesiology

## 2022-01-17 ENCOUNTER — Ambulatory Visit (HOSPITAL_BASED_OUTPATIENT_CLINIC_OR_DEPARTMENT_OTHER): Payer: Medicare HMO | Admitting: Anesthesiology

## 2022-01-17 ENCOUNTER — Encounter (HOSPITAL_COMMUNITY)
Admission: RE | Disposition: A | Discharge status: Home / Self Care | Payer: Self-pay | Source: Home / Self Care | Attending: Cardiovascular Disease

## 2022-01-17 ENCOUNTER — Observation Stay (HOSPITAL_COMMUNITY)
Admission: RE | Admit: 2022-01-17 | Discharge: 2022-01-18 | Disposition: A | Discharge status: Home / Self Care | Payer: Medicare HMO | Attending: Cardiovascular Disease | Admitting: Cardiovascular Disease

## 2022-01-17 DIAGNOSIS — I480 Paroxysmal atrial fibrillation: Secondary | ICD-10-CM | POA: Diagnosis not present

## 2022-01-17 DIAGNOSIS — Z87891 Personal history of nicotine dependence: Secondary | ICD-10-CM | POA: Diagnosis not present

## 2022-01-17 DIAGNOSIS — Z85828 Personal history of other malignant neoplasm of skin: Secondary | ICD-10-CM | POA: Diagnosis not present

## 2022-01-17 DIAGNOSIS — D6869 Other thrombophilia: Secondary | ICD-10-CM | POA: Insufficient documentation

## 2022-01-17 DIAGNOSIS — I10 Essential (primary) hypertension: Secondary | ICD-10-CM | POA: Insufficient documentation

## 2022-01-17 DIAGNOSIS — I484 Atypical atrial flutter: Principal | ICD-10-CM | POA: Insufficient documentation

## 2022-01-17 DIAGNOSIS — G4733 Obstructive sleep apnea (adult) (pediatric): Secondary | ICD-10-CM | POA: Diagnosis not present

## 2022-01-17 DIAGNOSIS — I4891 Unspecified atrial fibrillation: Secondary | ICD-10-CM | POA: Diagnosis not present

## 2022-01-17 DIAGNOSIS — Z79899 Other long term (current) drug therapy: Secondary | ICD-10-CM | POA: Diagnosis not present

## 2022-01-17 DIAGNOSIS — Z7901 Long term (current) use of anticoagulants: Secondary | ICD-10-CM | POA: Diagnosis not present

## 2022-01-17 DIAGNOSIS — Z8546 Personal history of malignant neoplasm of prostate: Secondary | ICD-10-CM | POA: Diagnosis not present

## 2022-01-17 DIAGNOSIS — I2510 Atherosclerotic heart disease of native coronary artery without angina pectoris: Secondary | ICD-10-CM

## 2022-01-17 DIAGNOSIS — I080 Rheumatic disorders of both mitral and aortic valves: Secondary | ICD-10-CM | POA: Diagnosis not present

## 2022-01-17 DIAGNOSIS — Z9989 Dependence on other enabling machines and devices: Secondary | ICD-10-CM | POA: Diagnosis not present

## 2022-01-17 HISTORY — PX: A-FLUTTER ABLATION: EP1230

## 2022-01-17 LAB — CBC
HCT: 42.3 % (ref 39.0–52.0)
Hemoglobin: 15.3 g/dL (ref 13.0–17.0)
MCH: 33.5 pg (ref 26.0–34.0)
MCHC: 36.2 g/dL — ABNORMAL HIGH (ref 30.0–36.0)
MCV: 92.6 fL (ref 80.0–100.0)
Platelets: 171 10*3/uL (ref 150–400)
RBC: 4.57 MIL/uL (ref 4.22–5.81)
RDW: 12.8 % (ref 11.5–15.5)
WBC: 5 10*3/uL (ref 4.0–10.5)
nRBC: 0 % (ref 0.0–0.2)

## 2022-01-17 LAB — BASIC METABOLIC PANEL
Anion gap: 8 (ref 5–15)
BUN: 15 mg/dL (ref 8–23)
CO2: 23 mmol/L (ref 22–32)
Calcium: 9.1 mg/dL (ref 8.9–10.3)
Chloride: 105 mmol/L (ref 98–111)
Creatinine, Ser: 1.11 mg/dL (ref 0.61–1.24)
GFR, Estimated: >60 mL/min (ref >60)
Glucose, Bld: 85 mg/dL (ref 70–99)
Potassium: 4.5 mmol/L (ref 3.5–5.1)
Sodium: 136 mmol/L (ref 135–145)

## 2022-01-17 LAB — POCT ACTIVATED CLOTTING TIME
Activated Clotting Time: 314 seconds
Activated Clotting Time: 320 seconds

## 2022-01-17 SURGERY — A-FLUTTER ABLATION
Anesthesia: General

## 2022-01-17 MED ORDER — SODIUM CHLORIDE 0.9 % IV SOLN: INTRAVENOUS | Status: DC

## 2022-01-17 MED ORDER — HYDRALAZINE HCL 25 MG PO TABS
25.0000 mg | ORAL_TABLET | Freq: Two times a day (BID) | ORAL | Status: DC
  Administered 2022-01-17 – 2022-01-18 (×2): 25 mg via ORAL
  Filled 2022-01-17 (×2): qty 1

## 2022-01-17 MED ORDER — ACETAMINOPHEN 500 MG PO TABS
1000.0000 mg | ORAL_TABLET | Freq: Once | ORAL | Status: AC
  Administered 2022-01-17: 1000 mg via ORAL
  Filled 2022-01-17: qty 2

## 2022-01-17 MED ORDER — PHENYLEPHRINE 80 MCG/ML (10ML) SYRINGE FOR IV PUSH (FOR BLOOD PRESSURE SUPPORT)
PREFILLED_SYRINGE | INTRAVENOUS | Status: DC | PRN
  Administered 2022-01-17 (×2): 80 ug via INTRAVENOUS

## 2022-01-17 MED ORDER — ONDANSETRON HCL 4 MG/2ML IJ SOLN: 4.0000 mg | Freq: Four times a day (QID) | INTRAMUSCULAR | Status: DC | PRN

## 2022-01-17 MED ORDER — HEPARIN (PORCINE) IN NACL 1000-0.9 UT/500ML-% IV SOLN
INTRAVENOUS | Status: DC | PRN
  Administered 2022-01-17 (×3): 500 mL

## 2022-01-17 MED ORDER — FLUTICASONE PROPIONATE 50 MCG/ACT NA SUSP: 2.0000 | Freq: Every evening | NASAL | Status: DC | PRN

## 2022-01-17 MED ORDER — FENTANYL CITRATE (PF) 100 MCG/2ML IJ SOLN: 25.0000 ug | INTRAMUSCULAR | Status: DC | PRN

## 2022-01-17 MED ORDER — HEPARIN SODIUM (PORCINE) 1000 UNIT/ML IJ SOLN
INTRAMUSCULAR | Status: DC | PRN
  Administered 2022-01-17: 15000 [IU] via INTRAVENOUS

## 2022-01-17 MED ORDER — HEPARIN SODIUM (PORCINE) 1000 UNIT/ML IJ SOLN
INTRAMUSCULAR | Status: AC
  Filled 2022-01-17: qty 10

## 2022-01-17 MED ORDER — MIDAZOLAM HCL 2 MG/2ML IJ SOLN
INTRAMUSCULAR | Status: DC | PRN
  Administered 2022-01-17: 1 mg via INTRAVENOUS

## 2022-01-17 MED ORDER — ROCURONIUM BROMIDE 10 MG/ML (PF) SYRINGE
PREFILLED_SYRINGE | INTRAVENOUS | Status: DC | PRN
  Administered 2022-01-17: 60 mg via INTRAVENOUS
  Administered 2022-01-17: 20 mg via INTRAVENOUS

## 2022-01-17 MED ORDER — APIXABAN 5 MG PO TABS
5.0000 mg | ORAL_TABLET | Freq: Two times a day (BID) | ORAL | Status: DC
  Administered 2022-01-17 – 2022-01-18 (×2): 5 mg via ORAL
  Filled 2022-01-17 (×2): qty 1

## 2022-01-17 MED ORDER — SODIUM CHLORIDE 0.9% FLUSH: 3.0000 mL | INTRAVENOUS | Status: DC | PRN

## 2022-01-17 MED ORDER — ONDANSETRON HCL 4 MG/2ML IJ SOLN: 4.0000 mg | Freq: Once | INTRAMUSCULAR | Status: DC | PRN

## 2022-01-17 MED ORDER — PROTAMINE SULFATE 10 MG/ML IV SOLN
INTRAVENOUS | Status: DC | PRN
  Administered 2022-01-17 (×2): 20 mg via INTRAVENOUS
  Administered 2022-01-17: 10 mg via INTRAVENOUS

## 2022-01-17 MED ORDER — HEPARIN (PORCINE) IN NACL 1000-0.9 UT/500ML-% IV SOLN
INTRAVENOUS | Status: AC
  Filled 2022-01-17: qty 500

## 2022-01-17 MED ORDER — LIDOCAINE 2% (20 MG/ML) 5 ML SYRINGE
INTRAMUSCULAR | Status: DC | PRN
  Administered 2022-01-17: 80 mg via INTRAVENOUS

## 2022-01-17 MED ORDER — CARVEDILOL 3.125 MG PO TABS
3.1250 mg | ORAL_TABLET | Freq: Two times a day (BID) | ORAL | Status: DC
  Administered 2022-01-17 – 2022-01-18 (×2): 3.125 mg via ORAL
  Filled 2022-01-17 (×2): qty 1

## 2022-01-17 MED ORDER — PROPOFOL 10 MG/ML IV BOLUS
INTRAVENOUS | Status: DC | PRN
  Administered 2022-01-17: 150 mg via INTRAVENOUS

## 2022-01-17 MED ORDER — PHENYLEPHRINE HCL-NACL 20-0.9 MG/250ML-% IV SOLN
INTRAVENOUS | Status: DC | PRN
  Administered 2022-01-17: 25 ug/min via INTRAVENOUS

## 2022-01-17 MED ORDER — SUGAMMADEX SODIUM 200 MG/2ML IV SOLN
INTRAVENOUS | Status: DC | PRN
  Administered 2022-01-17: 200 mg via INTRAVENOUS

## 2022-01-17 MED ORDER — PSYLLIUM 95 % PO PACK
1.0000 | PACK | Freq: Every evening | ORAL | Status: DC
  Administered 2022-01-17: 1 via ORAL
  Filled 2022-01-17: qty 1

## 2022-01-17 MED ORDER — AMLODIPINE BESYLATE 10 MG PO TABS
10.0000 mg | ORAL_TABLET | Freq: Every day | ORAL | Status: DC
  Administered 2022-01-18: 10 mg via ORAL
  Filled 2022-01-17: qty 1

## 2022-01-17 MED ORDER — SODIUM CHLORIDE 0.9% FLUSH
3.0000 mL | Freq: Two times a day (BID) | INTRAVENOUS | Status: DC
  Administered 2022-01-17 – 2022-01-18 (×2): 3 mL via INTRAVENOUS

## 2022-01-17 MED ORDER — HEPARIN SODIUM (PORCINE) 1000 UNIT/ML IJ SOLN
INTRAMUSCULAR | Status: DC | PRN
  Administered 2022-01-17: 1000 [IU] via INTRAVENOUS

## 2022-01-17 MED ORDER — SODIUM CHLORIDE 0.9 % IV SOLN: 250.0000 mL | INTRAVENOUS | Status: DC | PRN

## 2022-01-17 MED ORDER — IRBESARTAN 150 MG PO TABS
75.0000 mg | ORAL_TABLET | Freq: Every day | ORAL | Status: DC
  Administered 2022-01-18: 75 mg via ORAL
  Filled 2022-01-17: qty 1

## 2022-01-17 MED ORDER — ACETAMINOPHEN 325 MG PO TABS: 650.0000 mg | ORAL_TABLET | ORAL | Status: DC | PRN

## 2022-01-17 MED ORDER — ONDANSETRON HCL 4 MG/2ML IJ SOLN
INTRAMUSCULAR | Status: DC | PRN
  Administered 2022-01-17: 4 mg via INTRAVENOUS

## 2022-01-17 MED ORDER — DEXAMETHASONE SODIUM PHOSPHATE 10 MG/ML IJ SOLN
INTRAMUSCULAR | Status: DC | PRN
  Administered 2022-01-17: 10 mg via INTRAVENOUS

## 2022-01-17 MED ORDER — FENTANYL CITRATE (PF) 250 MCG/5ML IJ SOLN
INTRAMUSCULAR | Status: DC | PRN
  Administered 2022-01-17 (×2): 50 ug via INTRAVENOUS

## 2022-01-17 SURGICAL SUPPLY — 17 items
CATH 8FR REPROCESSED SOUNDSTAR (CATHETERS) ×1 IMPLANT
CATH 8FR SOUNDSTAR REPROCESSED (CATHETERS) IMPLANT
CATH ABLAT QDOT MICRO BI TC DF (CATHETERS) IMPLANT
CATH OCTARAY 2.0 F 3-3-3-3-3 (CATHETERS) IMPLANT
CATH PIGTAIL STEERABLE D1 8.7 (WIRE) IMPLANT
CATH S-M CIRCA TEMP PROBE (CATHETERS) IMPLANT
CATH WEB BI DIR CSDF CRV REPRO (CATHETERS) IMPLANT
CLOSURE PERCLOSE PROSTYLE (VASCULAR PRODUCTS) IMPLANT
DEVICE CLOSURE MYNXGRIP 6/7F (Vascular Products) IMPLANT
PACK EP LATEX FREE (CUSTOM PROCEDURE TRAY) ×1
PACK EP LF (CUSTOM PROCEDURE TRAY) ×2 IMPLANT
PAD DEFIB RADIO PHYSIO CONN (PAD) ×2 IMPLANT
PATCH CARTO3 (PAD) IMPLANT
SHEATH CARTO VIZIGO SM CVD (SHEATH) IMPLANT
SHEATH PINNACLE 8F 10CM (SHEATH) IMPLANT
SHEATH PINNACLE 9F 10CM (SHEATH) IMPLANT
TUBING SMART ABLATE COOLFLOW (TUBING) IMPLANT

## 2022-01-17 NOTE — Progress Notes (Signed)
Handoff given to nurse Cardell Peach, RN. Pt in stable condition at time of report.

## 2022-01-17 NOTE — Anesthesia Preprocedure Evaluation (Addendum)
Anesthesia Evaluation  Patient identified by MRN, date of birth, ID band Patient awake    Reviewed: Allergy & Precautions, NPO status , Patient's Chart, lab work & pertinent test results, reviewed documented beta blocker date and time   Airway Mallampati: II  TM Distance: >3 FB Neck ROM: Full    Dental  (+) Dental Advisory Given, Missing   Pulmonary sleep apnea and Continuous Positive Airway Pressure Ventilation , former smoker   Pulmonary exam normal breath sounds clear to auscultation       Cardiovascular hypertension, Pt. on medications and Pt. 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  Reproductive/Obstetrics                              Anesthesia Physical Anesthesia Plan  ASA: 3  Anesthesia Plan: General   Post-op Pain Management: Tylenol PO (pre-op)*   Induction: Intravenous  PONV Risk Score and Plan: 2  Airway Management Planned: Oral ETT  Additional Equipment:   Intra-op Plan:   Post-operative Plan: Extubation in OR  Informed Consent: I have reviewed the patients History and Physical, chart, labs and discussed the procedure including the risks, benefits and alternatives for the proposed anesthesia with the patient or authorized representative who has indicated his/her understanding and acceptance.     Dental advisory given  Plan Discussed with: CRNA  Anesthesia  Plan Comments:          Anesthesia Quick Evaluation

## 2022-01-17 NOTE — Progress Notes (Signed)
Report called to [place], RN on [place].

## 2022-01-17 NOTE — Anesthesia Postprocedure Evaluation (Signed)
Anesthesia Post Note  Patient: Mario [name]  Procedure(s) Performed: A-FLUTTER ABLATION     Patient location during evaluation: PACU Anesthesia Type: General Level of consciousness: awake and alert Pain management: pain level controlled Vital Signs Assessment: post-procedure vital signs reviewed and stable Respiratory status: spontaneous breathing, nonlabored ventilation and respiratory function stable Cardiovascular status: blood pressure returned to baseline Postop Assessment: no apparent nausea or vomiting Anesthetic complications: no   There were no known notable events for this encounter.  Last Vitals:  Vitals:   01/17/22 1613 01/17/22 1624  BP: (!) 129/59 (!) 127/58  Pulse: 70 71  Resp: 13 12  Temp:  36.8 C  SpO2: 99% 95%    Last Pain:  Vitals:   01/17/22 1624  TempSrc: Temporal  PainSc:                  Marthenia Rolling

## 2022-01-17 NOTE — Transfer of Care (Signed)
Immediate Anesthesia Transfer of Care Note  Patient: Mario [name]  Procedure(s) Performed: A-FLUTTER ABLATION  Patient Location: Cath Lab  Anesthesia Type:General  Level of Consciousness: awake and alert   Airway & Oxygen Therapy: Patient Spontanous Breathing and Patient connected to nasal cannula oxygen  Post-op Assessment: Report given to RN, Post -op Vital signs reviewed and stable, and Patient moving all extremities X 4  Post vital signs: Reviewed and stable  Last Vitals:  Vitals Value Taken Time  BP 148/73   Temp    Pulse 64   Resp 14   SpO2 96     Last Pain:  Vitals:   01/17/22 1224  TempSrc:   PainSc: 0-No pain         Complications: There were no known notable events for this encounter.

## 2022-01-17 NOTE — Discharge Instructions (Signed)
Post procedure care instructions No driving for 4 days. No lifting over 5 lbs for 1 week. No vigorous or sexual activity for 1 week. You may return to work/your usual activities on 01/25/22. Keep procedure site clean & dry. If you notice increased pain, swelling, bleeding or pus, call/return!  You may shower after 24 hours, but no soaking in baths/hot tubs/pools for 1 week.

## 2022-01-17 NOTE — Anesthesia Procedure Notes (Signed)
Procedure Name: Intubation Date/Time: 01/17/2022 1:45 PM  Performed by: Bryson Corona, CRNAPre-anesthesia Checklist: Patient identified, Emergency Drugs available, Suction available and Patient being monitored Patient Re-evaluated:Patient Re-evaluated prior to induction Oxygen Delivery Method: Circle System Utilized Preoxygenation: Pre-oxygenation with 100% oxygen Induction Type: IV induction Ventilation: Two handed mask ventilation required Laryngoscope Size: Mac and 4 Grade View: Grade I Tube type: Oral Tube size: 7.5 mm Number of attempts: 1 Airway Equipment and Method: Stylet and Oral airway Placement Confirmation: ETT inserted through vocal cords under direct vision, positive ETCO2 and breath sounds checked- equal and bilateral Secured at: 23 cm Tube secured with: Tape Dental Injury: Teeth and Oropharynx as per pre-operative assessment

## 2022-01-17 NOTE — Progress Notes (Signed)
Patient transported to 4E by Verline Lema, NT.

## 2022-01-18 ENCOUNTER — Other Ambulatory Visit (HOSPITAL_COMMUNITY): Payer: Self-pay

## 2022-01-18 ENCOUNTER — Encounter (HOSPITAL_COMMUNITY): Payer: Self-pay | Admitting: Cardiovascular Disease

## 2022-01-18 DIAGNOSIS — I10 Essential (primary) hypertension: Secondary | ICD-10-CM | POA: Diagnosis not present

## 2022-01-18 DIAGNOSIS — D6869 Other thrombophilia: Secondary | ICD-10-CM | POA: Diagnosis not present

## 2022-01-18 DIAGNOSIS — Z85828 Personal history of other malignant neoplasm of skin: Secondary | ICD-10-CM | POA: Diagnosis not present

## 2022-01-18 DIAGNOSIS — Z7901 Long term (current) use of anticoagulants: Secondary | ICD-10-CM | POA: Diagnosis not present

## 2022-01-18 DIAGNOSIS — Z79899 Other long term (current) drug therapy: Secondary | ICD-10-CM | POA: Diagnosis not present

## 2022-01-18 DIAGNOSIS — I480 Paroxysmal atrial fibrillation: Secondary | ICD-10-CM | POA: Diagnosis not present

## 2022-01-18 DIAGNOSIS — Z8546 Personal history of malignant neoplasm of prostate: Secondary | ICD-10-CM | POA: Diagnosis not present

## 2022-01-18 DIAGNOSIS — I484 Atypical atrial flutter: Secondary | ICD-10-CM | POA: Diagnosis not present

## 2022-01-18 DIAGNOSIS — Z87891 Personal history of nicotine dependence: Secondary | ICD-10-CM | POA: Diagnosis not present

## 2022-01-18 MED ORDER — PANTOPRAZOLE SODIUM 40 MG PO TBEC
40.0000 mg | DELAYED_RELEASE_TABLET | Freq: Every day | ORAL | 0 refills | Status: DC
  Filled 2022-01-18: qty 30, 30d supply, fill #0

## 2022-01-18 NOTE — Plan of Care (Signed)
  Problem: Education: Goal: Understanding of disease, treatment, and recovery process will improve Outcome: Progressing   Problem: Activity: Goal: Ability to return to baseline activity level will improve Outcome: Progressing   Problem: Cardiac: Goal: Ability to maintain adequate cardiovascular perfusion will improve Outcome: Progressing

## 2022-01-18 NOTE — Progress Notes (Signed)
Nursing DC note   Patient alert and orientd,no complaints. Vss. Iv site unremarkable. Both wife and patient verbalized understanding of dc instructions. All belongings given to patient. Toc med given as well.

## 2022-01-18 NOTE — Discharge Summary (Signed)
ELECTROPHYSIOLOGY PROCEDURE DISCHARGE SUMMARY    Patient ID: Mario [name],  MRN: [id number], DOB/AGE: 1940/06/13 81 y.o.  Admit date: 01/17/2022 Discharge date: 01/18/2022  Primary Care Physician: Marrian Salvage, [place]  Primary Cardiologist: Dr. Claiborne Billings Electrophysiologist: Dr. Myles Gip  Primary Discharge Diagnosis:  Atypical AFlutter Paroxysmal AFib       CHA2DS2Vasc is 3, on Eliquis, appropriately dosed  Secondary Discharge Diagnosis:  HTN Secondary hypercoagulable state OSA  Procedures This Admission:  1.  Electrophysiology study and radiofrequency catheter ablation on by Dr Myles Gip   01/18/22 CONCLUSIONS: Successful ablation/isolation of the posterior wall 2.  Intracardiac echo reveals persistent moderate pericardial effusion 3. No early apparent complications.    Brief HPI: Mario [name] is a 81 y.o. male with a history of paroxysmal atrial fibrillation/typical flutter previously ablated developed atypical flutter and follows w/EP .  They have failed medical therapy with Flecainide. Risks, benefits, and alternatives to catheter ablation of atrial fibrillation were reviewed with the patient who wished to proceed.     Hospital Course:  The patient was admitted and underwent EPS/RFCA with details as outlined in his procedure report.  They were monitored on telemetry overnight which demonstrated SR, one fleetingly brief PAT.  Groin was without complication on the day of discharge.  The patient  feels well today, denies CP, has some mild baseline SOB unchanged, site discomfort.  He was examined by Dr. Myles Gip and considered to be stable for discharge.  Wound care and restrictions were reviewed with the patient.  The patient has f/u with Dr. Myles Gip in place.  Dr. Myles Gip discussed if recurrent arrhythmia, he can resume his flecainide '50mg'$  BID, I have advised him if he does to let us know so we can f/u with an EKG for him.   Physical Exam: Vitals:   01/17/22 2029 01/18/22  0000 01/18/22 0509 01/18/22 0731  BP: (!) 142/70 139/67 (!) 148/72 (!) 152/72  Pulse: 81  84 84  Resp: '16  20 19  '$ Temp: 98.7 F (37.1 C) 98 F (36.7 C) 98.2 F (36.8 C) 98.1 F (36.7 C)  TempSrc: Oral Oral Oral Oral  SpO2: 96%  97% 97%  Weight:      Height:        GEN- The patient is well appearing, alert and oriented x 3 today.   HEENT: normocephalic, atraumatic; sclera clear, conjunctiva pink; hearing intact; oropharynx clear; neck supple  Lungs-  CTA b/l, normal work of breathing.  No wheezes, rales, rhonchi Heart-  RRR, no murmurs, rubs or gallops  GI- soft, non-tender, non-distended, bowel sounds present  Extremities- no clubbing, cyanosis, or edema; DP/PT/radial pulses 2+ bilaterally, b/l  groins are stable  without hematoma/bruit MS- no significant deformity or atrophy Skin- warm and dry, no rash or lesion Psych- euthymic mood, full affect Neuro- strength and sensation are intact   Labs:   Lab Results  Component Value Date   WBC 5.0 01/17/2022   HGB 15.3 01/17/2022   HCT 42.3 01/17/2022   MCV 92.6 01/17/2022   PLT 171 01/17/2022    Recent Labs  Lab 01/17/22 1239  NA 136  K 4.5  CL 105  CO2 23  BUN 15  CREATININE 1.11  CALCIUM 9.1  GLUCOSE 85     Discharge Medications:  Allergies as of 01/18/2022       Reactions   Contrast Media [iodinated Contrast Media] Shortness Of Breath, Rash, Other (See Comments)   First noted after Coronary CTA 02/24/20. Pt experienced wheezing, shortness  of breath and diffuse rash/welts over face and trunk.   Lisinopril Other (See Comments)   REACTION: severe GI upset requiring EGD--temporally related to lisinopril--tolerated micardis        Medication List     TAKE these medications    amLODipine 10 MG tablet Commonly known as: NORVASC Take 1 tablet (10 mg total) by mouth daily.   carvedilol 3.125 MG tablet Commonly known as: COREG Take 1 tablet by mouth twice daily   Eliquis 5 MG Tabs tablet Generic drug:  apixaban Take 1 tablet by mouth twice daily   fluticasone 50 MCG/ACT nasal spray Commonly known as: FLONASE Place 2 sprays into both nostrils at bedtime as needed for allergies.   GLUCOSAMINE-CHONDROITIN PO Take 1 tablet by mouth daily.   hydrALAZINE 25 MG tablet Commonly known as: APRESOLINE TAKE 1 TABLET BY MOUTH TWICE DAILY APPOINTMENT  REQUIRED  FOR  FURTHER  REFILLS   irbesartan 75 MG tablet Commonly known as: Avapro Take 1 tablet (75 mg total) by mouth daily.   Lutein 20 MG Caps Take 20 mg by mouth daily.   METAMUCIL FIBER PO Take 1 Scoop by mouth 3 (three) times a week.   multivitamin tablet Take 1 tablet by mouth daily.   pantoprazole 40 MG tablet Commonly known as: Protonix Take 1 tablet (40 mg total) by mouth daily.   SAW PALMETTO PO Take 2 tablets by mouth daily.   testosterone cypionate 200 MG/ML injection Commonly known as: DEPOTESTOSTERONE CYPIONATE Inject 100 mg into the muscle every Wednesday.   THERATEARS PF OP Place 1 drop into both eyes daily as needed (dry eyes).        Disposition: Home Discharge Instructions     Diet - low sodium heart healthy   Complete by: As directed    Increase activity slowly   Complete by: As directed         Duration of Discharge Encounter: Greater than 30 minutes including physician time.  Venetia Night, PA-C 01/18/2022 10:44 AM

## 2022-01-21 NOTE — H&P (Signed)
Cardiology Office Note:    Date:  01/21/2022   ID:  Mario [name], DOB 08-11-1940, MRN [id number]  PCP:  Marrian Salvage, [place] [facility] Cardiologist:  Shelva Majestic, MD Electrophysiologist:  Melida Quitter, MD     Referring MD: No ref. provider found   Chief complaint: fatigue, shortness of breath  History of Present Illness:    Mario [name] is a 81 y.o. male with a hx of sleep apnea, hypertension, atrial fibrillation who presents for electrophysiology follow-up.  He was diagnosed with atrial fibrillation in 2016 and maintained on flecainide.  He has been on Eliquis for CHA2DS2-VASc score of 4.  He is a former patient of Dr. Rayann Heman, who performed a fib and flutter ablation in January 2022 after he failed flecainide.  He was noted to have recurrence of atrial flutter in September 2023, so flecainide was resumed.  In follow-up later in the month, the patient remained in atypical flutter.  DC cardioversion was scheduled for 11/06/2021. He appeared to be in sinus rhythm with LBBB at the time.  He reports that he was very fatigued and shortness of breath while on flecainide.  He still is more fatigued and short of breath than he has at baseline, back in atypical flutter today with narrow QRS.  He presents today for ablation.   Past Medical History:  Diagnosis Date   Anticoagulant long-term use    eliquis--- managed by cardiology   Arthritis    neck   Benign localized prostatic hyperplasia with lower urinary tract symptoms (LUTS)    Blister    06-25-2021  per pt has a small blister at end of tailbone from doing sit-up's, covers w/ band-aid   ED (erectile dysfunction)    First degree heart block    History of adenomatous polyp of colon    History of basal cell carcinoma (BCC) of skin    excision multiple area's   History of kidney stones    History of malignant melanoma of back 2000   mid back  s/p WLE 12/ 2011 stage 1, localized per pt and no  recurrence   History of narrow angle glaucoma    per pt s/p laser both eyes no issues since approx 2006   History of squamous cell carcinoma of skin    excision multiple area's   Hypertension    Hypogonadism in male    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   urologist--- dr bell/  radiation oncology-- dr Tammi Klippel;  dx 02/ 2030m Gleason 3+4   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    [place] cardiac CT 02-24-2020   452m  OSA on CPAP    per pt uses nightly   [facility]   cardiologist--- dr t.Darene LamerkeClaiborne Billings  [place] and 12[place]2014;  03-02-2020  s/p afib ablation by Dr AlRayann Heman Seasonal allergies    Wears hearing aid in both ears     Past Surgical History:  Procedure Laterality Date   A-FLUTTER ABLATION N/A 01/17/2022   Procedure: A-FLUTTER ABLATION;  Surgeon: MeMelida QuitterMD;  [facility];  Service: Cardiovascular;  Laterality: N/A;   ATRIAL FIBRILLATION ABLATION N/A 03/02/2020   Procedure: ATRIAL FIBRILLATION ABLATION;  Surgeon: AlThompson GrayerMD;  [facility];  Service: Cardiovascular;  Laterality: N/A;   CARDIOVERSION N/A 12/01/2014   Procedure: CARDIOVERSION;  Surgeon: ThTroy SineMD;  [facility] Service: Cardiovascular;  Laterality:  N/A;   CARDIOVERSION N/A 02/22/2020   Procedure: CARDIOVERSION;  Surgeon: Buford Dresser, MD;  Location: [facility] ENDOSCOPY;  Service: Cardiovascular;  Laterality: N/A;   CATARACT EXTRACTION W/ INTRAOCULAR LENS IMPLANT Bilateral    2000; 2004   COLONOSCOPY WITH ESOPHAGOGASTRODUODENOSCOPY (EGD)  09/2019   CYSTOSCOPY W/ URETEROSCOPY W/ LITHOTRIPSY  05/12/2003   '@[facility]'$    ELBOW SURGERY Left 1984   tendon repair left arm   GOLD SEED IMPLANT N/A 06/27/2021   Procedure: GOLD SEED IMPLANT;  Surgeon: Lucas Mallow, MD;  Location: [facility];  Service: Urology;  Laterality: N/A;   INCISION AND DRAINAGE OF WOUND Left 08/28/2016   Procedure: IRRIGATION  AND DEBRIDEMENT WOUND;  Surgeon: Iran Planas, MD;  Location: [place];  Service: Orthopedics;  Laterality: Left;   LIPOMA EXCISION Right 04/18/2010   right thigh   MASS EXCISION Left 07/2001   excision left axila mass (benign)   MELANOMA EXCISION  01/2000   @ [facility];   WLE of mid back for melanoma and excision resection lipoma upper back   METACARPAL OSTEOTOMY Left 08/28/2016   Procedure: 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.;  Surgeon: Iran Planas, MD;  Location: [place];  Service: Orthopedics;  Laterality: Left;   MICROLARYNGOSCOPY WITH LASER N/A 01/25/2015   Procedure: MICROLARYNGOSCOPY ;  Surgeon: Melida Quitter, MD;  Location: [place];  Service: ENT;  Laterality: N/A;  Microlaryngoscopy with 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 hand   TONSILLECTOMY  1970    Current Medications: Current Meds  Medication Sig   amLODipine (NORVASC) 10 MG tablet Take 1 tablet (10 mg total) by mouth daily.   apixaban (ELIQUIS) 5 MG TABS tablet Take 1 tablet by mouth twice daily   Carboxymethylcellulose Sodium (THERATEARS PF OP) Place 1 drop into both eyes daily as needed (dry eyes).   carvedilol (COREG) 3.125 MG tablet Take 1 tablet by mouth twice daily   fluticasone (FLONASE) 50 MCG/ACT nasal spray Place 2 sprays into both nostrils at bedtime as needed for allergies.   GLUCOSAMINE-CHONDROITIN PO Take 1 tablet by mouth daily.    hydrALAZINE (APRESOLINE) 25 MG tablet TAKE 1 TABLET BY MOUTH TWICE DAILY APPOINTMENT  REQUIRED  FOR  FURTHER  REFILLS   irbesartan (AVAPRO) 75 MG tablet Take 1 tablet (75 mg total) by mouth daily.   Lutein 20 MG CAPS Take 20 mg by mouth daily.    METAMUCIL FIBER PO Take 1 Scoop by mouth 3 (three) times a week.   Multiple Vitamin (MULTIVITAMIN) tablet Take 1 tablet by mouth daily.   pantoprazole (PROTONIX) 40 MG tablet Take 1 tablet (40 mg total) by mouth daily.   Saw Palmetto, Serenoa repens, (SAW PALMETTO PO) Take 2 tablets by mouth daily.   testosterone cypionate (DEPOTESTOSTERONE CYPIONATE) 200 MG/ML injection Inject 100 mg into the muscle every Wednesday.     Allergies:   Contrast media [iodinated contrast media] and Lisinopril   Social History   Socioeconomic History   Marital status: Married    Spouse name: Not on file   Number of children: 2   Years of education: Not on file  Highest education level: Not on file  Occupational History   Occupation: retired  Tobacco Use   Smoking status: Former    Years: 30.00    Types: Cigarettes    Start date: 11/20/1976    Quit date: 1986    Years since quitting: 37.9   Smokeless tobacco: Never  Vaping Use   Vaping Use: Never used  Substance and Sexual Activity   Alcohol use: Not Currently   Drug use: Never   Sexual activity: Not on file  Other Topics Concern   Not on file  Social History Narrative   Lives in [place].  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   Social Determinants of Health   Financial Resource Strain: Not on file  Food Insecurity: No Food Insecurity (10/08/2021)   Hunger Vital Sign    Worried About Running Out of Food in the Last Year: Never true    Ran Out of Food in the Last Year: Never true  Transportation Needs: No Transportation Needs (10/08/2021)   PRAPARE - Hydrologist (Medical): No    Lack of Transportation (Non-Medical): No  Physical Activity: Not on file  Stress: Not on file  Social Connections: Not on file     Family History: The patient's family history includes Heart disease in his mother, sister, and sister; Hypertension in his sister; Other in his father; Stroke in his father.  There is no history of Colon cancer, Stomach cancer, Esophageal cancer, Pancreatic cancer, or Liver disease.  ROS:   Please see the history of present illness.    All other systems reviewed and are negative.  EKGs/Labs/Other Studies Reviewed:      EKG:  Last EKG results: atypical flutter, V-rate 66 bpm   Recent Labs: 03/30/2021: ALT 22 01/17/2022: BUN 15; Creatinine, Ser 1.11; Hemoglobin 15.3; Platelets 171; Potassium 4.5; Sodium 136     Physical Exam:    VS:  BP (!) 128/58 (BP Location: Left Arm)   Pulse 81   Temp (!) 97.4 F (36.3 C) (Oral)   Resp 19   Ht 6' (1.829 m)   Wt 91.6 kg   SpO2 97%   BMI 27.40 kg/m     Wt Readings from Last 3 Encounters:  01/17/22 91.6 kg  12/04/21 91.2 kg  11/06/21 90.7 kg     GEN: Well nourished, well developed in no acute distress CARDIAC: RRR, no murmurs, rubs, gallops RESPIRATORY:  Normal work of breathing MUSCULOSKELETAL: no edema    ASSESSMENT & PLAN:    Atypical atrial flutter: recurred after discontinuation of flecainide. I recommended ablation. We discussed the indication, rationale, logistics, anticipated benefits, and potential risks of the ablation procedure including but not limited to -- bleed at the groin access site, chest pain, damage to nearby organs such as the diaphragm, lungs, or esophagus, need for a drainage tube, or prolonged hospitalization. I explained that the risk for stroke, heart attack, need for open chest surgery, or even death is very low but not zero. He expressed understanding and wishes to proceed.  Atrial fibrillation: will assess prior PVI at time of flutter ablation if left atrium is accessed High risk medication: flecainide discontinued due to LBBB with 1st degree AV block Secondary hypercoagulable state: due to AF. Continue eliquis          Medication Adjustments/Labs and Tests Ordered: Current medicines are reviewed at length with the patient today.  Concerns regarding medicines are  outlined above.  Orders Placed This Encounter  Procedures   CBC   Basic metabolic panel   Diet - low sodium heart healthy   Informed Consent Details: Physician/Practitioner Attestation; Transcribe to consent form and obtain patient signature   Initiate Pre-op Protocol   Void on call to EP Lab   Confirm CBC and BMP (or CMP) results within 7 days for inpatient and 30 days for outpatient:   Clip right and left femoral area PM before surgery   Clip right internal jugular area PM before surgery   Pre-admission testing diagnosis   Informed Consent Details: Physician/Practitioner Attestation; Transcribe to consent form and obtain patient signature   Verify informed consent   Remove and safely store all jewelry.  Tape rings in place that cannot be removed.   Patient to wear a single hospital gown - Ask patient to remove dentures, if any   Pre-admission testing diagnosis   Apply EP Study/Ablation Care Plan   Increase activity slowly   POCT Activated clotting time   POCT Activated clotting time   EP STUDY   EKG 12-Lead in AM   EKG 12-Lead   Place in observation (patient's expected length of stay will be less than 2 midnights)   Discharge patient Discharge disposition: 01-Home or Self Care; Discharge patient date: 01/18/2022   Meds ordered this encounter  Medications   acetaminophen (TYLENOL) tablet 1,000 mg   DISCONTD: 0.9 %  sodium chloride infusion   DISCONTD: fentaNYL (SUBLIMAZE) injection 25-50 mcg   DISCONTD: ondansetron (ZOFRAN) injection 4 mg   DISCONTD: 0.9 %  sodium chloride infusion   DISCONTD: Heparin (Porcine) in NaCl 1000-0.9 UT/500ML-% SOLN   DISCONTD: heparin sodium (porcine) injection   DISCONTD: sodium chloride flush (NS) 0.9 % injection 3 mL   DISCONTD: sodium chloride flush (NS) 0.9 % injection 3 mL   DISCONTD: 0.9 %  sodium chloride infusion   DISCONTD: acetaminophen (TYLENOL) tablet 650 mg   DISCONTD: ondansetron (ZOFRAN) injection 4 mg   DISCONTD: amLODipine  (NORVASC) tablet 10 mg   DISCONTD: apixaban (ELIQUIS) tablet 5 mg   DISCONTD: carvedilol (COREG) tablet 3.125 mg   DISCONTD: fluticasone (FLONASE) 50 MCG/ACT nasal spray 2 spray   DISCONTD: hydrALAZINE (APRESOLINE) tablet 25 mg   DISCONTD: irbesartan (AVAPRO) tablet 75 mg   DISCONTD: psyllium (HYDROCIL/METAMUCIL) 1 packet   pantoprazole (PROTONIX) 40 MG tablet    Sig: Take 1 tablet (40 mg total) by mouth daily.    Dispense:  30 tablet    Refill:  0    Order Specific Question:   Supervising Provider    Answer:   Constance Haw [[id number]]     Signed, Melida Quitter, MD  01/21/2022 7:20 AM    [place]

## 2022-01-24 DIAGNOSIS — D485 Neoplasm of uncertain behavior of skin: Secondary | ICD-10-CM | POA: Diagnosis not present

## 2022-01-24 DIAGNOSIS — Z85828 Personal history of other malignant neoplasm of skin: Secondary | ICD-10-CM | POA: Diagnosis not present

## 2022-01-24 DIAGNOSIS — L723 Sebaceous cyst: Secondary | ICD-10-CM | POA: Diagnosis not present

## 2022-01-24 DIAGNOSIS — L821 Other seborrheic keratosis: Secondary | ICD-10-CM | POA: Diagnosis not present

## 2022-01-24 DIAGNOSIS — Z85820 Personal history of malignant melanoma of skin: Secondary | ICD-10-CM | POA: Diagnosis not present

## 2022-01-24 DIAGNOSIS — C4441 Basal cell carcinoma of skin of scalp and neck: Secondary | ICD-10-CM | POA: Diagnosis not present

## 2022-01-24 DIAGNOSIS — D225 Melanocytic nevi of trunk: Secondary | ICD-10-CM | POA: Diagnosis not present

## 2022-01-24 DIAGNOSIS — L309 Dermatitis, unspecified: Secondary | ICD-10-CM | POA: Diagnosis not present

## 2022-02-03 DIAGNOSIS — G4733 Obstructive sleep apnea (adult) (pediatric): Secondary | ICD-10-CM | POA: Diagnosis not present

## 2022-02-06 ENCOUNTER — Encounter (HOSPITAL_COMMUNITY): Payer: Self-pay | Admitting: Cardiovascular Disease

## 2022-02-06 ENCOUNTER — Ambulatory Visit: Payer: Medicare HMO | Admitting: Cardiovascular Disease

## 2022-02-19 ENCOUNTER — Other Ambulatory Visit: Payer: Self-pay | Admitting: Cardiovascular Disease

## 2022-02-19 DIAGNOSIS — I4891 Unspecified atrial fibrillation: Secondary | ICD-10-CM

## 2022-02-19 NOTE — Telephone Encounter (Signed)
Eliquis '5mg'$  refill request received. Patient is 82 years old, weight-91.6kg, Crea- 1.11 on 01/17/2022, Diagnosis-Afib, and last seen by Dr. Myles Gip on 12/04/2021. Dose is appropriate based on dosing criteria. Will send in refill to requested pharmacy.

## 2022-02-25 ENCOUNTER — Encounter: Payer: Self-pay | Admitting: Cardiovascular Disease

## 2022-02-25 ENCOUNTER — Ambulatory Visit: Payer: Medicare Other | Attending: Cardiovascular Disease | Admitting: Cardiovascular Disease

## 2022-02-25 VITALS — BP 134/64 | HR 110 | Ht 72.0 in | Wt 200.4 lb

## 2022-02-25 DIAGNOSIS — I484 Atypical atrial flutter: Secondary | ICD-10-CM | POA: Diagnosis not present

## 2022-02-25 MED ORDER — CARVEDILOL 3.125 MG PO TABS: ORAL_TABLET | ORAL | 2 refills | Status: DC

## 2022-02-25 NOTE — Patient Instructions (Addendum)
Medication Instructions:  Your physician has recommended you make the following change in your medication:   **Call our office to schedule a time for admission to hospital for Tikosyn loading dose.**  1) CHANGE carvedilol (Coreg) to 6.'25mg'$  in the morning and 3.'125mg'$  in the evening.  *If you need a refill on your cardiac medications before your next appointment, please call your pharmacy*  Lab Work: NONE  Testing/Procedures: NONE  Follow-Up: At [facility], you and your health needs are our priority.  As part of our continuing mission to provide you with exceptional heart care, we have created designated Provider Care Teams.  These Care Teams include your primary Cardiologist (physician) and Advanced Practice Providers (APPs -  Physician Assistants and Nurse Practitioners) who all work together to provide you with the care you need, when you need it.  Your next appointment:   6 month(s)  Provider:   Doralee Albino, MD

## 2022-02-25 NOTE — Progress Notes (Signed)
Cardiology Office Note:    Date:  02/25/2022   ID:  Mario [name], DOB [birth year], MRN [id number]  PCP:  Marrian Salvage, [place] [facility] Cardiologist:  Shelva Majestic, MD Electrophysiologist:  Melida Quitter, MD     Referring MD: Marrian Salvage,*   Chief complaint: fatigue, shortness of breath  History of Present Illness:    Mario [name] is a 82 y.o. male with a hx of sleep apnea, hypertension, atrial fibrillation who presents for electrophysiology follow-up.  He was diagnosed with atrial fibrillation in 2016 and maintained on flecainide.  He has been on Eliquis for CHA2DS2-VASc score of 4.  He is a former patient of Dr. Rayann Heman, who performed a fib and flutter ablation in January 2022 after he failed flecainide.  He was noted to have recurrence of atrial flutter in September 2023, so flecainide was resumed.  In follow-up later in the month, the patient remained in atypical flutter.  DC cardioversion was scheduled for 11/06/2021. He appeared to be in sinus rhythm with LBBB at the time.  He reports that he was very fatigued and shortness of breath while on flecainide.  He still is more fatigued and short of breath than he has at baseline, back in atypical flutter today with narrow QRS.  02/25/2021 Since his last clinic visit, he underwent ablation for atypical atrial flutter.  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 There was an atypical flutter, but unfortunately he converted to atrial fibrillation, so we were not able to map completely.  From the work that we were able to do, the flutter appeared to be involving the roof and posterior wall.  I did ablate a roofline and inferior posterior wall line.  Unfortunately, due to temperature rises, I was unable to deliver aggressive lesions on the posterior wall.   He has noticed more fatigue than usual. His wife had a mass detected during a mammogram and will be undergoing breast biopsy  soon. She has a strong family history of breast cancer.  Past Medical History:  Diagnosis Date   Anticoagulant long-term use    eliquis--- managed by cardiology   Arthritis    neck   Benign localized prostatic hyperplasia with lower urinary tract symptoms (LUTS)    Blister    06-25-2021  per pt has a small blister at end of tailbone from doing sit-up's, covers w/ band-aid   ED (erectile dysfunction)    First degree heart block    History of adenomatous polyp of colon    History of basal cell carcinoma (BCC) of skin    excision multiple area's   History of kidney stones    History of malignant melanoma of back 2000   mid back  s/p WLE 12/ 2011 stage 1, localized per pt and no recurrence   History of narrow angle glaucoma    per pt s/p laser both eyes no issues since approx 2006   History of squamous cell carcinoma of skin    excision multiple area's   Hypertension    Hypogonadism in male    Malignant neoplasm prostate [facility]) 03/2021   urologist--- dr bell/  radiation oncology-- dr Tammi Klippel;  dx 02/ 2067m Gleason 3+4   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    [place] cardiac CT 02-24-2020   462m  OSA on CPAP    per pt uses nightly   [facility]   cardiologist--- dr t. keClaiborne Billings  DCCV 02-22-2020 and 12-01-2014;  03-02-2020  s/p afib ablation by Dr Rayann Heman   Seasonal allergies    Wears hearing aid in both ears     Past Surgical History:  Procedure Laterality Date   A-FLUTTER ABLATION N/A 01/17/2022   Procedure: A-FLUTTER ABLATION;  Surgeon: Chinmay Squier, Yetta Barre, MD;  Location: [place] CV LAB;  Service: Cardiovascular;  Laterality: N/A;   ATRIAL FIBRILLATION ABLATION N/A 03/02/2020   Procedure: ATRIAL FIBRILLATION ABLATION;  Surgeon: [name], MD;  Location: [place] CV LAB;  Service: Cardiovascular;  Laterality: N/A;   CARDIOVERSION N/A 12/01/2014   Procedure: CARDIOVERSION;  Surgeon: Troy Sine, MD;  Location: [place];  Service:  Cardiovascular;  Laterality: N/A;   CARDIOVERSION N/A 02/22/2020   Procedure: CARDIOVERSION;  Surgeon: Buford Dresser, MD;  Location[facility] ENDOSCOPY;  Service: Cardiovascular;  Laterality: N/A;   CATARACT EXTRACTION W/ INTRAOCULAR LENS IMPLANT Bilateral    2000; 2004   COLONOSCOPY WITH ESOPHAGOGASTRODUODENOSCOPY (EGD)  09/2019   CYSTOSCOPY W/ URETEROSCOPY W/ LITHOTRIPSY  05/12/2003   '@[facility]'$    ELBOW SURGERY Left 1984   tendon repair left arm   GOLD SEED IMPLANT N/A 06/27/2021   Procedure: GOLD SEED IMPLANT;  Surgeon: Lucas Mallow, MD;  [facility];  Service: Urology;  Laterality: N/A;   INCISION AND DRAINAGE OF WOUND Left 08/28/2016   Procedure: IRRIGATION AND DEBRIDEMENT WOUND;  Surgeon: Iran Planas, MD;  Location: [place];  Service: Orthopedics;  Laterality: Left;   LIPOMA EXCISION Right 04/18/2010   right thigh   MASS EXCISION Left 07/2001   excision left axila mass (benign)   MELANOMA EXCISION  01/2000   @ [facility];   WLE of mid back for melanoma and excision resection lipoma upper back   METACARPAL OSTEOTOMY Left 08/28/2016   Procedure: 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.;  Surgeon: Iran Planas, MD;  Location: [place];  Service: Orthopedics;  Laterality: Left;   MICROLARYNGOSCOPY WITH LASER N/A 01/25/2015   Procedure: MICROLARYNGOSCOPY ;  Surgeon: Melida Quitter, MD;  Location: [place];  Service: ENT;  Laterality: N/A;  Microlaryngoscopy with 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 hand   TONSILLECTOMY  1970    Current Medications:    Allergies:   Contrast media [iodinated contrast media] and Lisinopril   Social  History   Socioeconomic History   Marital status: Married    Spouse name: Not on file   Number of children: 2   Years of education: Not on file   Highest education level: Not on file  Occupational History   Occupation: retired  Tobacco Use   Smoking status: Former    Years: 30.00    Types: Cigarettes    Start date: 11/20/1976    Quit date: 1986    Years since quitting: 38.0   Smokeless tobacco: Never  Vaping Use   Vaping Use: Never used  Substance and Sexual Activity   Alcohol use: Not Currently   Drug use: Never   Sexual activity: Not on file  Other Topics Concern   Not on file  Social History Narrative   Lives in [place].  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   Social Determinants of Health   Financial Resource Strain: Not on file  Food Insecurity: No Food Insecurity (10/08/2021)   Hunger Vital Sign    Worried About Running Out of Food in the Last Year: Never true    Ran Out of Food in the Last Year: Never true  Transportation Needs: No Transportation Needs (10/08/2021)   PRAPARE - Hydrologist (Medical): No    Lack of Transportation (Non-Medical): No  Physical Activity: Not on file  Stress: Not on file  Social Connections: Not on file     Family History: The patient's family history includes Heart disease in his mother, sister, and sister; Hypertension in his sister; Other in his father; Stroke in his father. There is no history of Colon cancer, Stomach cancer, Esophageal cancer, Pancreatic cancer, or Liver disease.  ROS:   Please see the history of present illness.    All other systems reviewed and are negative.  EKGs/Labs/Other Studies Reviewed:      EKG:  Last EKG results: atypical flutter, V-rate 110 bpm   Recent Labs: 03/30/2021: ALT 22 01/17/2022: BUN 15; Creatinine, Ser 1.11; Hemoglobin 15.3; Platelets 171; Potassium 4.5; Sodium 136     Physical Exam:    VS:  BP 134/64   Pulse (!) 110    Ht 6' (1.829 m)   Wt 200 lb 6.4 oz (90.9 kg)   SpO2 96%   BMI 27.18 kg/m     Wt Readings from Last 3 Encounters:  02/25/22 200 lb 6.4 oz (90.9 kg)  01/17/22 202 lb (91.6 kg)  12/04/21 201 lb (91.2 kg)     GEN: Well nourished, well developed in no acute distress CARDIAC: RRR, no murmurs, rubs, gallops RESPIRATORY:  Normal work of breathing MUSCULOSKELETAL: no edema    ASSESSMENT & PLAN:    Atypical atrial flutter: s/p ablation of roof and inferior posterior wall line in 01/17/22 -- limited by esophageal temp rise. Now with recurrent flutter. At this point, I think medical therapy will be the best course. I recommended Tikosyn load. He will plan to schedule when his wife's issues with potential breast cancer are better understood.  Atrial fibrillation: PVI from previous ablation intact. High risk medication: flecainide discontinued due to LBBB with 1st degree AV block Secondary hypercoagulable state: due to AF. Continue eliquis          Medication Adjustments/Labs and Tests Ordered: Current medicines are reviewed at length with the patient today.  Concerns regarding medicines are outlined above.  No orders of the defined types were placed in this encounter.  No orders of the defined types were placed in this encounter.    Signed, Melida Quitter, MD  02/25/2022 11:58 AM    [place]

## 2022-02-27 ENCOUNTER — Telehealth: Payer: Self-pay | Admitting: Cardiovascular Disease

## 2022-02-27 NOTE — Telephone Encounter (Signed)
Pt c/o medication issue:  1. Name of Medication: Tikosyn   2. How are you currently taking this medication (dosage and times per day)?   3. Are you having a reaction (difficulty breathing--STAT)?   4. What is your medication issue? Pt's wife is requesting a tele consult with Dr. Claiborne Billings to go over this medication Dr. Myles Gip would like to start the patient on. She says they are concerned and would like to first speak to Dr. Claiborne Billings before making any decisions

## 2022-02-27 NOTE — Telephone Encounter (Signed)
Called home number, spoke with wife. She advised for me to contact husband on cell phone.   I did try to attempt to contact patient on cell number, number would not connect. Will try again later.

## 2022-03-01 NOTE — Telephone Encounter (Signed)
Add to note:   Patient states he is feeling fine. He does not have any symptoms to report.  Thanks!

## 2022-03-01 NOTE — Telephone Encounter (Signed)
Called patient, he states that he recently seen Dr.Mealor- they recommended after a failed ablation that he go on Tikosyn. Patient is not sure about going on this medication and would like to check with Dr.Kelly first.   I advised that I would check with Dr.Kelly, however he did not return until next week. Patient made Dr.Mealor aware that he did not want to go on this yet, and they advised him to call when he was ready to be placed on it.   Will route to MD for when he returns.   Thank you!

## 2022-03-12 NOTE — Telephone Encounter (Signed)
Called patient, LVM advised I had not received a response yet from Dr.Kelly, but I would write a note to myself to ask him in office this week.   I did apologize for any delay.  Advised patient to call back if further questions/concerns.

## 2022-03-12 NOTE — Telephone Encounter (Signed)
Patient called to follow up on this, he would like a call back.

## 2022-03-15 NOTE — Telephone Encounter (Signed)
Called patient, advised of message below. He was still unsure about starting the medication and I recommended that he be seen in office to discuss further. He would like this very much.  I did schedule for next week 02/07 with Dr.Kelly.   Patient thankful for visit.

## 2022-03-15 NOTE — Telephone Encounter (Signed)
Review the information of Dr. Myles Gip.  Agree with tempt with medical therapy with Tikosyn.  However will need to be hospitalized for close monitoring of QT interval initiation of therapy

## 2022-03-20 ENCOUNTER — Encounter: Payer: Self-pay | Admitting: Cardiovascular Disease

## 2022-03-20 ENCOUNTER — Ambulatory Visit: Payer: Medicare Other | Attending: Cardiovascular Disease | Admitting: Cardiovascular Disease

## 2022-03-20 DIAGNOSIS — I484 Atypical atrial flutter: Secondary | ICD-10-CM

## 2022-03-20 DIAGNOSIS — D6869 Other thrombophilia: Secondary | ICD-10-CM

## 2022-03-20 DIAGNOSIS — I350 Nonrheumatic aortic (valve) stenosis: Secondary | ICD-10-CM

## 2022-03-20 DIAGNOSIS — I10 Essential (primary) hypertension: Secondary | ICD-10-CM

## 2022-03-20 DIAGNOSIS — G4733 Obstructive sleep apnea (adult) (pediatric): Secondary | ICD-10-CM | POA: Diagnosis not present

## 2022-03-20 DIAGNOSIS — Z7901 Long term (current) use of anticoagulants: Secondary | ICD-10-CM

## 2022-03-20 DIAGNOSIS — I4819 Other persistent atrial fibrillation: Secondary | ICD-10-CM

## 2022-03-20 NOTE — Patient Instructions (Signed)
Medication Instructions:  Your physician recommends that you continue on your current medications as directed. Please refer to the Current Medication list given to you today.  *If you need a refill on your cardiac medications before your next appointment, please call your pharmacy*   Lab Work: None   Testing/Procedures: None   Follow-Up: At [facility], you and your health needs are our priority.  As part of our continuing mission to provide you with exceptional heart care, we have created designated Provider Care Teams.  These Care Teams include your primary Cardiologist (physician) and Advanced Practice Providers (APPs -  Physician Assistants and Nurse Practitioners) who all work together to provide you with the care you need, when you need it.  We recommend signing up for the patient portal called "MyChart".  Sign up information is provided on this After Visit Summary.  MyChart is used to connect with patients for Virtual Visits (Telemedicine).  Patients are able to view lab/test results, encounter notes, upcoming appointments, etc.  Non-urgent messages can be sent to your provider as well.   To learn more about what you can do with MyChart, go to [email or web address].    Your next appointment:   Keep current appt   Provider:   Shelva Majestic, MD

## 2022-03-20 NOTE — Progress Notes (Signed)
Patient ID: Mario [name], male   DOB: [birth year], 82 y.o.   MRN: [id number]      Primary M.D.: Dr. Mallie Mussel   HPI:  Mario [name] is a 82 y.o. male who presents for a 15 month follow-up cardiology evaluation.  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  He underwent endoscopy by Dr. Erskine Emery and was noted to have an irregular heart rhythm with  sinus rhythm with PVCs and PACs when connected to the monitor.  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  He had undergone a cardiac catheterization in approximately 1985.  Last year  noticed chest tightness when working out and also had noticed significant increasing shortness of breath.  He has been aware of a somewhat irregular rhythm.  Over several months.  In July he was recently treated with diverticulitis with Cipro and Flagyl and had noticed increased palpitations at that time.  When I saw for initial evaluation on 09/27/2014 he was in atrial fibrillation which he was completely unaware of.  On further questioning, he omitted that he snores very loudly and has had significant episodes of witnessed apnea.  He can fall asleep during the day at any time if circumstances permit.  He has a history of hypertension.  Recently he had been taking atenolol 150 mg in the morning had taken testosterone injections once a week.  I changed his atenolol to 100 mg in the morning and 50 mg grams at night.  He was significantly hypertensive and I added amlodipine 5 mg to his medical regimen.  I scheduled him for a nuclear perfusion study which was done in 10/07/2014.  There were no ECG changes.  There was a small defect of mild severity in the basal inferior wall.  It was felt most likely this was diaphragmatic attenuation.  Ejection fraction was 59%.  An echo Doppler study revealed an EF  of 50-55% and raise the possibility of mild posterior basal hypokinesis.  He had mild AR and mild MR.  He was started on eliquis at his office visit.  On follow-up evaluation, his blood pressure was still elevated.  I increased his amlodipine to 7.5 mg in the morning and also started him on losartan 50 mg daily.  He underwent a sleep study which confirm my suspicion for obstructive sleep apnea.  Overall sleep apnea was mild with an AHI of 12.3 , but events were moderate with supine sleep with an AHI of 22.8 per hour. He has significant oxygen desaturation to a nadir of 81% and on the initial study had reduced sleep efficiency at 61.8% and evidence for loud snoring.  He subsequently underwent a CPAP titration trial and he was titrated up to 8 cm water pressure with excellent result.  He underwent successful cardioversion for his atrial fibrillation  On 12/01/2014. He admits to one brief episode following the cardioversion while sleeping before CPAP therapy was initiated and this had reverted back to normal rhythm in the morning when he awakened  CPAP was initiated on November 14, 2014. He has a ResMed Airfit P10 nasalpillow mask, medium size. A download was reviewed from 12/12/2014 through 01/10/2015. His compliance is excellent at 100% of usage.  He is averaging 8 hours and 24 minutes.  His AHI was still mildly elevated at 6.5,  but significant improved initially.  There is no significant leak.  He had undergone biopsy of his laryngeal nodule, which was negative.  When I saw him in January 2017, he complained of several days of being back in atrial fibrillation which seem to be precipitated after prolonged sneezing spell.  When I saw him, with his recurrent AF and normal LV function.  I recommended initiation of flecanide at 50 mg twice a day.  He was advised to reduce his atenolol from 75 g twice a day to 50 mg twice a day for 2 days and on the third day to reduce his atenolol to 25 g twice a day.  After 2  doses of flecanide he felt that his heart rhythm had again stabilized.  A CPAP download  from January 24, 2015 through 02/19/2015 demonstrated 100% compliance.  He averaged 9 hours of sleep per night.  He is set at an auto mode and his 95th percentile pressure was 13.7 with a maximum 16.9.  AHI was 7.9.  Of note, one week ago when I saw the patient.  We discussed turning off his ramp since at the beginning of his sleep.  He was having some difficulty getting adequate air. I rechecked a download for the past week when the has been off, AHI was slightly improved at 5.8/hr.  He feels well.  He has more energy.  He is exercising regularly.  He believes his sleep is good quality.  He denies residual daytime sleepiness.  In April 2017 he had been under increased mental stress and felt that  he went back into atrial fibrillation for approximately 12 hours, but then ultimately his rhythm stabilized.  When I saw him 6 weeks ago, he was in sinus rhythm.  At that time I increased his flecainide to 75 mg twice a day.  I also scheduled him for routine treadmill test to make certain he did not have any flecanide induced proarrhythmia.  This did not demonstrate any exercise-induced arrhythmia.  He was noted to have upsloping ST segment depression of approximately 1 mm with rapid resolution in less than 1 minute in early recovery.  Previously, his nuclear perfusion study did not show any ischemia.  He feels that his mental stress has improved since his daughter seems to be reconciling with her husband and is no longer planning to go through a divorce.  He denies any chest pain or shortness of breath.    When I saw him in October 2018.  He was unaware of any recurrent episodes of atrial fibrillation.  However, at times he had experienced an occasional skipped beat.   He was cutting wood and accidentally cut off the distal aspect of his fourth finger of his left hand several months ago.  He also had been taking testosterone  prescribed by his urologist, but recently his hemoglobin had increased and he reduced his testosterone dose in half.  He has continued to be on flecainide 75 mg twice a day in addition to eliquis 5 mg twice a day for anticoagulation.  He also has been on amlodipine 7.5 mg, and losartan 100 mg in addition to atenolol 6.25 mg twice a day for hypertension.  He continues to use CPAP and cannot sleep without it.  In his words "a life changor."  His CPAP set up day was 03/04/2016.  A download was obtained in the office today from April 9 through 06/18/2017.  He is 100% compliant in sleeping 8 hours and 13 minutes.  He has a ResMed AirSense 10 AutoSet unit with a minimum pressure at 8 and maximum of 20.  95th percentile pressure was 12.6 with a maximum average pressure at 14.6.  AHI was 3.2 but there were days of significant leak.  He just recently got a new mask.  He admits to being under increased stress.  His dad is been in hospice.   His wife' dad unfortunately died on 10/06/17 at age 73, 1 month prior to turning 3. This resulted in increased stress to [name].  His blood pressure was noted to be somewhat labile during this.[name]  He is still working out at least 3 days/week lifting weights.  He continues to use CPAP with 100% compliance.  He states blood pressure typically has been in the 140s.  He needs to transient ankle edema particularly if he stands up for long duration while singing in the choir.  He is unaware of any breakthrough atrial fibrillation or rhythm disturbance.    I saw him in December 2019 at which time he was maintaining sinus rhythm, was 1% compliant with CPAP therapy.  QTc interval was stable.  On March 11, 2018 He developed an episode of lightheadedness and recurrent atrial fibrillation and was evaluated in the emergency room.  His heart rate was not fast and he was found to have up to 2-second pause.  Atenolol was discontinued and flecainide was further increased to 100 mg twice a  day.  He underwent cardioversion in the emergency room with restoration of sinus rhythm.  He was subsequently seen on March 13, 2018 by Mammie Russian, PA-C and was noted to have frequent PACs and PVCs.  He was started back on low-dose beta-blocker therapy with carvedilol 3.125 mg twice a day.  When seen several weeks later on March 31, 2018 heart rate was stable.  ECG showed sinus rhythm and QTc interval was normal at 393 ms.  However hydralazine was added to his regimen for mild blood pressure elevation.   Since he was last seen by Almyra Deforest, PAC, he has felt well.  He has continued to be very active working out at [employer] and now that the gym is closed due to the curve at 19 pandemic, he has been active building a deck at his house.  He lives outside [place] on a 12 acre plot of land.   I evaluated hum in a telemedicine vsist on 05/12/2018.  He was unaware of any recurrent atrial fibrillation.  He denies any anginal type chest pain symptoms.  He continues to use CPAP with 100% compliance.  When asked how his blood pressures have been running he states typically his blood pressure runs in the 1 20-1 30 range with diastolics in the 57S to 17B.  He underwent a repeat echo Doppler study on April 30, 2018.  This shows normal systolic function with an EF of 60 to 65%.  There is mild to moderate biatrial enlargement.  There is evidence for mild aortic stenosis with a mean gradient of 10 and peak gradient of 18.  He had mild aortic root dilatation at 41 mm.    When I last saw him on December 27, 2020 [name] has been back to the gym and  has been working out with significant weights. He had noticed increased heart rates after he had taken his first and second vaccination with PVCs. He denies any chest pain. He continues to be on amlodipine 10 mg, carvedilol 3.125 mg twice  a day, flecainide 100 mg twice a day, as well as losartan 100 mg daily. He is unaware of recurrent atrial fibrillation. He continues to use  CPAP with excellent compliance. I obtained a download in the office today from October 1 through December 27, 2019 with average CPAP use is 9 hours and 17 minutes. He has 100% compliant. His CPAP is set at a range of 8 to 20 cm and his AHI is 2.2. 95th percentile pressure is 12.4 with a maximum average pressure of 15.1.   He has had evaluations with Clint Fenton and Dr. Rayann Heman for atrial fibrillation after failing therapy with flecainide in January 2022.  He had been exercising heavily trying to get ready for a weightlifting competition.  He apparently underwent cardioversion in January 2022 and ablation by Dr. Rayann Heman on March 02, 2020 for persistent atrial fibrillation at which time he was also found to have isthmus dependent right atrial flutter and multiple left atrial flutter circuits.  Subsequently he did develop a significant groin hematoma which ultimately stabilized.  I last saw him on December 25, 2020 at which time he was unaware of any recurrent atrial fibrillation. He is now off flecainide and carvedilol but does admit to occasional palpitations.  He has lost approximately 20 pounds.  He is training for a benchpress competition.  He continues to use CPAP and a download today from October 13 through December 22, 2020 shows 100% compliance with average use at 8 hours and 56 minutes.  At his pressure range of 10 to 20 cm of water his 95th percentile pressure is 13.1 with maximum average pressure 15.0 and AHI 2.2/h.   Since I last saw him, he developed recurrent atrial flutter in September 2023 and flecainide was resumed.  He has been evaluated by Dr. Myles Gip and EP and in follow-up he remained in atypical flutter.  DC cardioversion was scheduled for November 06, 2021 but at the time he appeared to be in sinus rhythm with left bundle branch block.  He underwent atrial flutter ablation on January 17, 2021 which showed all 4 pulmonary veins were isolated.  His a flutter was atypical but he converted to  atrial fibrillation and complete mapping was not obtained.  Refer to Dr. Ammie Ferrier note on February 25, 2022 the flutter appears to be involving the roof and posterior wall and he was unable to deliver aggressive lesions on the posterior wall due to temperature rise.  Presently, [name] remains relatively asymptomatic.  There had been discussion by Dr. Koren Bound for potential initiation of Tikosyn and he is uncertain if he wants to proceed with this.  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  He denies any angina.  Presently he is taking carvedilol 6.25 mg twice a day, Eliquis 5 mg twice a day, hydralazine 25 mg twice a day and irbesartan 75 mg daily.  He is on pantoprazole for GERD.  He continues to use CPAP therapy and is in need for new DME company since his prior company is no longer in the CPAP business.  I obtained a download from January 8 through January 17, 2023 which continues to show excellent compliance with 8 hours and 20 minutes of use.  At his pressure range of 10 to 20 cm of water, AHI is 3.2 with 95th percentile pressure 13.3 and maximum average pressure at 15.2.  He presents for evaluation.   Past Medical History:  Diagnosis Date   Anticoagulant  long-term use    eliquis--- managed by cardiology   Arthritis    neck   Benign localized prostatic hyperplasia with lower urinary tract symptoms (LUTS)    Blister    06-25-2021  per pt has a small blister at end of tailbone from doing sit-up's, covers w/ band-aid   ED (erectile dysfunction)    First degree heart block    History of adenomatous polyp of colon    History of basal cell carcinoma (BCC) of skin    excision multiple area's   History of kidney stones    History of malignant melanoma of back 2000   mid back  s/p WLE 12/ 2011 stage 1, localized per pt and no recurrence   History of narrow angle glaucoma    per pt s/p laser both eyes no issues since approx 2006   History of  squamous cell carcinoma of skin    excision multiple area's   Hypertension    Hypogonadism in male    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   urologist--- dr bell/  radiation oncology-- dr Tammi Klippel;  dx 02/ 2063m Gleason 3+4   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 ([facility]    per cardiac CT 02-24-2020   422m  OSA on CPAP    per pt uses nightly   [facility]   cardiologist--- dr t.Darene LamerkeClaiborne Billings  DCMc[facility]1-12-2020 and 12-01-2014;  03-02-2020  s/p afib ablation by Dr AlRayann Heman Seasonal allergies    Wears hearing aid in both ears     Past Surgical History:  Procedure Laterality Date   A-FLUTTER ABLATION N/A 01/17/2022   Procedure: A-FLUTTER ABLATION;  Surgeon: MeMelida QuitterMD;  [facility];  Service: Cardiovascular;  Laterality: N/A;   ATRIAL FIBRILLATION ABLATION N/A 03/02/2020   Procedure: ATRIAL FIBRILLATION ABLATION;  Surgeon: AlThompson GrayerMD;  [facility];  Service: Cardiovascular;  Laterality: N/A;   CARDIOVERSION N/A 12/01/2014   Procedure: CARDIOVERSION;  Surgeon: ThTroy SineMD;  [facility] Service: Cardiovascular;  Laterality: N/A;   CARDIOVERSION N/A 02/22/2020   Procedure: CARDIOVERSION;  Surgeon: ChBuford DresserMD;  [facility];  Service: Cardiovascular;  Laterality: N/A;   CATARACT EXTRACTION W/ INTRAOCULAR LENS IMPLANT Bilateral    2000; 2004   COLONOSCOPY WITH ESOPHAGOGASTRODUODENOSCOPY (EGD)  09/2019   CYSTOSCOPY W/ URETEROSCOPY W/ LITHOTRIPSY  05/12/2003   @[facility]    ELBOW SURGERY Left 1984   tendon repair left arm   GOLD SEED IMPLANT N/A 06/27/2021   Procedure: GOLD SEED IMPLANT;  Surgeon: BeLucas MallowMD;  [facility];  Laterality: N/A;   INCISION AND DRAINAGE OF WOUND Left 08/28/2016   Procedure: IRRIGATION AND DEBRIDEMENT WOUND;  Surgeon: OrIran PlanasMD;  [facility]: Orthopedics;  Laterality: Left;    LIPOMA EXCISION Right 04/18/2010   right thigh   MASS EXCISION Left 07/2001   excision left axila mass (benign)   MELANOMA EXCISION  01/2000   @ [facility];   WLE of mid back for melanoma and excision resection lipoma upper back   METACARPAL OSTEOTOMY Left 08/28/2016   Procedure: 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.;  Surgeon: OrIran PlanasMD;  [facility] Service: Orthopedics;  Laterality: Left;   MICROLARYNGOSCOPY WITH LASER N/A 01/25/2015   Procedure: MICROLARYNGOSCOPY ;  Surgeon: DwMelida QuitterMD;  [place] Service: ENT;  Laterality: N/A;  Microlaryngoscopy  with 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 with CO2 laser    SATURATION BIOPSY OF PROSTATE  01/18/2005   @[facility]   w/ anesthesia   SPACE OAR INSTILLATION N/A 06/27/2021   Procedure: SPACE OAR INSTILLATION;  Surgeon: Lucas Mallow, MD;  Location[facility];  Service: Urology;  Laterality: N/A;   TENDON REPAIR Right 2012   right  involving elbow, arm. hand   TONSILLECTOMY  1970    Allergies  Allergen Reactions   Contrast Media [Iodinated Contrast Media] Shortness Of Breath, Rash and Other (See Comments)    First noted after Coronary CTA 02/24/20. Pt experienced wheezing, shortness of breath and diffuse rash/welts over face and trunk.   Lisinopril Other (See Comments)    REACTION: severe GI upset requiring EGD--temporally related to lisinopril--tolerated micardis    Current Outpatient Medications  Medication Sig Dispense Refill   amLODipine (NORVASC) 10 MG tablet Take 1 tablet (10 mg total) by mouth daily. 90 tablet 1   apixaban (ELIQUIS) 5 MG TABS tablet Take 1 tablet by mouth twice daily 180 tablet 1   Carboxymethylcellulose Sodium (THERATEARS PF OP) Place 1 drop into both eyes daily as needed (dry eyes).     carvedilol (COREG) 3.125 MG tablet Take 2 tablets (6.25 mg total) by mouth in the morning AND 1 tablet (3.125 mg total) every evening. 270 tablet 2   fluticasone (FLONASE) 50 MCG/ACT  nasal spray Place 2 sprays into both nostrils at bedtime as needed for allergies.     GLUCOSAMINE-CHONDROITIN PO Take 1 tablet by mouth daily.      hydrALAZINE (APRESOLINE) 25 MG tablet TAKE 1 TABLET BY MOUTH TWICE DAILY APPT  REQUIRED  FOR  FURTHER  REFILLS 180 tablet 0   irbesartan (AVAPRO) 75 MG tablet Take 1 tablet (75 mg total) by mouth daily. 90 tablet 3   Lutein 20 MG CAPS Take 20 mg by mouth daily.     METAMUCIL FIBER PO Take 1 Scoop by mouth 3 (three) times a week.     Multiple Vitamin (MULTIVITAMIN) tablet Take 1 tablet by mouth daily.     pantoprazole (PROTONIX) 40 MG tablet Take 1 tablet (40 mg total) by mouth daily. 30 tablet 0   Saw Palmetto, Serenoa repens, (SAW PALMETTO PO) Take 2 tablets by mouth daily.     testosterone cypionate (DEPOTESTOSTERONE CYPIONATE) 200 MG/ML injection Inject 100 mg into the muscle every Wednesday.     No current facility-administered medications for this visit.    Social History   Socioeconomic History   Marital status: Married    Spouse name: Not on file   Number of children: 2   Years of education: Not on file   Highest education level: Not on file  Occupational History   Occupation: retired  Tobacco Use   Smoking status: Former    Years: 30.00    Types: Cigarettes    Start date: 11/20/1976    Quit date: 1986    Years since quitting: 38.1   Smokeless tobacco: Never  Vaping Use   Vaping Use: Never used  Substance and Sexual Activity   Alcohol use: Not Currently   Drug use: Never   Sexual activity: Not on file  Other Topics Concern   Not on file  Social History Narrative   Lives in [place].  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   Social Determinants of Health   Financial Resource Strain: Not on file  Food Insecurity:  No Food Insecurity (10/08/2021)   Hunger Vital Sign    Worried About Running Out of Food in the Last Year: Never true    Ran Out of Food in the Last Year: Never true  Transportation  Needs: No Transportation Needs (10/08/2021)   PRAPARE - Hydrologist (Medical): No    Lack of Transportation (Non-Medical): No  Physical Activity: Not on file  Stress: Not on file  Social Connections: Not on file  Intimate Partner Violence: Not on file   Additional social history is notable in that he is married for 43 years.  He has 2 children and 2 grandchildren.  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  He completed 12th grade of education.  Remotely he had smoked but quit in 1977.  He drinks 3 beers per day.  He has been exercising fairly regularly at least 4 days per week including weightlifting and cardio for~ 2 hours per session.  Family History  Problem Relation Age of Onset   Heart disease Mother    Stroke Father    Other Father        brain tumor-benign   Heart disease Sister    Heart disease Sister    Hypertension Sister    Colon cancer Neg Hx    Stomach cancer Neg Hx    Esophageal cancer Neg Hx    Pancreatic cancer Neg Hx    Liver disease Neg Hx    Family history is notable that his mother died at age 65 with heart disease and had atrial fibrillation and congestive heart failure.  Father died at 2 with a brain tumor.  He has 2 sisters and both have hypertension.   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 due to a sawing accident Hematologic/Oncologic: Negative; no easy bruising, bleeding Endocrine: Negative; no heat/cold intolerance; no diabetes Neuro: Negative; no changes in balance,  headaches Skin: Negative; No rashes or skin lesions Psychiatric: Negative; No behavioral problems, depression Sleep: Positive for previously loud snoring, frequent awakenings, nonrestorative sleep, and daytime sleepiness, hypersomnolence, now on CPAP therapy with resolution of symptoms; no bruxism, restless legs, hypnogagnic hallucinations Other comprehensive 14 point system review is negative   Physical Exam BP 134/72   Pulse (!) 58   Ht 6' (1.829 m)   Wt 201 lb 12.8 oz (91.5 kg)   SpO2 96%   BMI 27.37 kg/m    Repeat blood pressure by me 126/68  Wt Readings from Last 3 Encounters:  03/20/22 201 lb 12.8 oz (91.5 kg)  02/25/22 200 lb 6.4 oz (90.9 kg)  01/17/22 202 lb (91.6 kg)   General: Alert, oriented, no distress.  Skin: normal turgor, no rashes, warm and dry HEENT: Normocephalic, atraumatic. Pupils equal round and reactive to light; sclera anicteric; extraocular muscles intact; Nose without nasal septal hypertrophy Mouth/Parynx benign; Mallinpatti scale 3 Neck: No JVD, no carotid bruits; normal carotid upstroke Lungs: clear to ausculatation and percussion; no wheezing or rales Chest wall: without tenderness to palpitation Heart: PMI not displaced, irregular irregular with controlled ventricular rate in the upper 50s to low 60s, s1 s2 normal,  1/6 systolic murmur, no diastolic murmur, no rubs, gallops, thrills, or heaves Abdomen: soft, nontender; no hepatosplenomehaly, BS+; abdominal aorta nontender and not dilated by palpation. Back: no CVA tenderness Pulses 2+ Musculoskeletal: full range of motion, normal strength, no joint deformities Extremities: no clubbing cyanosis or edema, Homan's sign negative  Neurologic: grossly nonfocal; Cranial nerves grossly wnl Psychologic: Normal mood and affect   March 20, 2022 ECG (independently read by me):  Atrial fibrillation at 58  December 25, 2020 ECG (independently read by me): NSR at 78, normal intervals  November 2021 ECG  (independently read by me): NSR at 68;nonspecific  T changes, normal interval with QTc 414 msec  December 2019 ECG (independently read by me): Sinus bradycardia 55 bpm.  No ectopy.  Normal intervals.  May 2019 ECG (independently read by me): Bradycardia 56 bpm.  Nonspecific interventricular conduction delay.  QTc interval not 391 ms  October 2018 ECG (independently read by me): Sinus bradycardia 56 bpm.  No ectopy.  Normal intervals per  06/05/2016 ECG (independently read by me): Normal sinus rhythm at 61 bpm.  Nonspecific T changes.  QTc interval 390 ms.  October 2017 ECG (independently read by me): Sinus bradycardia 57 bpm.  Nonspecific ST changes.  QTc interval 393 ms.  06/30/2015 ECG (independently read by me): Sinus bradycardia at 56 bpm.  No significant ST-T changes.  QTc interval 389 ms.  05/16/2015 ECG (independently read by me): Sinus bradycardia 59 bpm.  QTc interval 376 ms.  No significant ST changes.  02/23/2015 ECG (independently read by me): Sinus bradycardia 59 bpm.  QTc interval 390 ms.  Nonspecific T changes.  02/17/15 ECG (independently read by me): Atrial fibrillation at 58 bpm.  Nonspecific ST changes.  QTc interval 380 ms.  01/12/2015 ECG  (independently read by me):  Normal sinus rhythm at 62 bpm. Nonspecific ST-T changes.  September 2016 ECG (independently read by me): Atrial fibrillation with a slow ventricular response in the 50s.  QTc interval 364 ms.  Prior CG (independently read by me):  Atrial fibrillation with ventricular rate at 52 bpm.  No significant ST-T change.  09/27/2014 ECG (independently read by me): Atrial fibrillation with ventricular rate at 59 bpm.  Nonspecific ST-T changes.  LABS:     Latest Ref Rng & Units 01/17/2022   12:39 PM 12/13/2021   10:07 AM 12/04/2021   11:07 AM  BMP  Glucose 70 - 99 mg/dL 85  93  81   BUN 8 - 23 mg/dL 15  17  28   $ Creatinine 0.61 - 1.24 mg/dL 1.11  1.28  1.48   BUN/Creat Ratio 10 - 24  13  19   $ Sodium 135 - 145  mmol/L 136  138  142   Potassium 3.5 - 5.1 mmol/L 4.5  4.5  5.2   Chloride 98 - 111 mmol/L 105  102  103   CO2 22 - 32 mmol/L 23  25  24   $ Calcium 8.9 - 10.3 mg/dL 9.1  9.3  9.8         Latest Ref Rng & Units 03/30/2021   12:56 PM 05/09/2020   11:37 AM 03/22/2020   10:14 AM  Hepatic Function  Total Protein 6.5 - 8.1 g/dL 7.1  6.9  7.3   Albumin 3.5 - 5.0 g/dL 4.0  4.2  4.2   AST 15 - 41 U/L 25  20  19   $ ALT 0 - 44 U/L 22  14  16   $ Alk Phosphatase 38 -  126 U/L 69  68  77   Total Bilirubin 0.3 - 1.2 mg/dL 0.6  0.6  0.6        Latest Ref Rng & Units 01/17/2022   12:39 PM 12/04/2021   11:07 AM 11/02/2021   11:07 AM  CBC  WBC 4.0 - 10.5 K/uL 5.0  5.7  4.8   Hemoglobin 13.0 - 17.0 g/dL 15.3  15.4  14.6   Hematocrit 39.0 - 52.0 % 42.3  45.2  43.0   Platelets 150 - 400 K/uL 171  201  176    Lab Results  Component Value Date   MCV 92.6 01/17/2022   MCV 96 12/04/2021   MCV 96.4 11/02/2021   Lab Results  Component Value Date   TSH 3.360 06/13/2017   Lab Results  Component Value Date   HGBA1C 4.9 05/06/2006     BNP    Component Value Date/Time   BNP 250.0 (H) 03/11/2018 1113    ProBNP No results found for: "PROBNP"   Lipid Panel     Component Value Date/Time   CHOL 166 05/09/2020 1137   CHOL 155 06/13/2017 0951   TRIG 158.0 (H) 05/09/2020 1137   HDL 42.60 05/09/2020 1137   HDL 45 06/13/2017 0951   CHOLHDL 4 05/09/2020 1137   VLDL 31.6 05/09/2020 1137   LDLCALC 92 05/09/2020 1137   LDLCALC 84 06/13/2017 0951    RADIOLOGY: No results found.  IMPRESSION:  1. Persistent atrial fibrillation ([place])   2. Atypical atrial flutter (HCC)   3. Essential hypertension   4. OSA on CPAP   5. Anticoagulation adequate   6. Secondary hypercoagulable state ([place])   7. Mild aortic stenosis      ASSESSMENT AND PLAN: Mr. Maxton Covill is an active young appearing 84 -year-old male who was found to be in atrial fibrillation in August 2016 and was started on  anticoagulation.  He had undergone initial cardioversion in October 2016 and develop recurrent atrial fibrillation in January 2017 converted to sinus rhythm with institution of flecanide.  He had been maintaining sinus rhythm with good blood pressure control on amlodipine, carvedilol, hydralazine and losartan in addition to his flecainide.  Unfortunately, in January 2022 he began to fail flecainide and developed recurrent persistent atrial fibrillation.  He ultimately underwent successful ablationby Dr. Rayann Heman on March 02, 2020 at which time he was also found to have in addition to persistent A. fib isthmus dependent right atrial flutter and multiple left atrial flutter circuits which were successfully ablated.  Subsequently,  flecainide and also carvedilol were discontinued.  Since his last evaluation with me in November 2022 he developed a typical atrial flutter and underwent attempted ablation by Dr. Myles Gip and during that procedure was not able to be fully mapped due to transitioning to atrial fibrillation.  He continues to be in atrial fibrillation with rate control and in his assessment he is remaining relatively asymptomatic without chest pain or significant shortness of breath.  He has concerns about starting Tikosyn as discussed with Dr. Myles Gip.  ECG today remains stable demonstrating atrial fibrillation with ventricular rate of 58 bpm.  His blood pressure is stable at 126/68 and he is on carvedilol 6.25 mg twice a day, amlodipine 10 mg daily, irbesartan 75 mg and hydralazine 25 mg twice a day.  I discussed potential benefit of Tikosyn with restoration of sinus rhythm and that he consider follow-up evaluation with Dr. Sheila Oats.  I also discussed that he is fairly asymptomatic on current therapy  despite being in atrial fibrillation and the importance of continued anticoagulation to reduce stroke risk.  He will contemplate his options with plans to follow-up with Dr. Myles Gip.  He continues to use CPAP therapy  and remains compliant.  With his current download showing an AHI of 3.2 I will change his pressure from 10-20 to a range of 11 to 20 cm of water.  Based on his insurance we will need to transfer him to [employer] to be his new DME company.  I will see him in May for which he already had a previously scheduled appointment for follow-up evaluation   Troy Sine, MD, [facility] 03/24/2022 12:50 PM

## 2022-03-21 ENCOUNTER — Telehealth: Payer: Self-pay | Admitting: *Deleted

## 2022-03-21 NOTE — Telephone Encounter (Signed)
CPAP supply order sent to [facility] via [employer] portal.

## 2022-03-24 ENCOUNTER — Encounter: Payer: Self-pay | Admitting: Cardiovascular Disease

## 2022-05-13 ENCOUNTER — Other Ambulatory Visit: Payer: Self-pay | Admitting: Cardiovascular Disease

## 2022-06-11 ENCOUNTER — Ambulatory Visit (INDEPENDENT_AMBULATORY_CARE_PROVIDER_SITE_OTHER): Payer: Medicare Other | Admitting: Family

## 2022-06-11 ENCOUNTER — Encounter: Payer: Self-pay | Admitting: Family

## 2022-06-11 VITALS — BP 138/68 | HR 67 | Resp 19 | Ht 72.0 in | Wt 197.2 lb

## 2022-06-11 DIAGNOSIS — Z13220 Encounter for screening for lipoid disorders: Secondary | ICD-10-CM | POA: Diagnosis not present

## 2022-06-11 DIAGNOSIS — Z0000 Encounter for general adult medical examination without abnormal findings: Secondary | ICD-10-CM | POA: Diagnosis not present

## 2022-06-11 NOTE — Progress Notes (Signed)
Mario [name] is a 82 y.o. 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     carvedilol (COREG) 3.125 MG tablet Take 2 tablets (6.25 mg total) by mouth in the morning AND 1 tablet (3.125 mg total) every evening. (Patient taking differently: Take 2 tablets (6.25 mg total) by mouth in the morning AND 1 tablet 6.25mg   every evening.) 270 tablet 2   fluticasone (FLONASE) 50 MCG/ACT nasal spray Place 2 sprays into both nostrils at bedtime as needed for allergies.     GLUCOSAMINE-CHONDROITIN PO Take 1 tablet by mouth daily.      hydrALAZINE (APRESOLINE) 25 MG tablet Take 1 tablet (25 mg total) by mouth 2 (two) times daily. 180 tablet 3   irbesartan (AVAPRO) 75 MG tablet Take 1  tablet (75 mg total) by mouth daily. 90 tablet 3   Lutein 20 MG CAPS Take 20 mg by mouth daily.     METAMUCIL FIBER PO Take 1 Scoop by mouth 3 (three) times a week.     Multiple Vitamin (MULTIVITAMIN) tablet Take 1 tablet by mouth daily.     Saw Palmetto, Serenoa repens, (SAW PALMETTO PO) Take 2 tablets by mouth daily.     testosterone cypionate (DEPOTESTOSTERONE CYPIONATE) 200 MG/ML injection Inject 100 mg into the muscle every Wednesday.     pantoprazole (PROTONIX) 40 MG tablet Take 1 tablet (40 mg total) by mouth daily. 30 tablet 0   No current facility-administered medications for this visit.    Allergies: Contrast media [iodinated contrast media] and Lisinopril  Past Medical History:  Diagnosis Date   Anticoagulant long-term use    eliquis--- managed by cardiology   Arthritis    neck   Benign localized prostatic hyperplasia with lower urinary tract symptoms (LUTS)    Blister    06-25-2021  per pt has a small blister at end of tailbone from doing sit-up's, covers w/ band-aid   ED (erectile dysfunction)    First degree heart block    History of adenomatous polyp of colon    History of basal cell carcinoma (BCC) of skin    excision multiple area's   History of kidney stones    History of malignant melanoma of back 2000   mid back  s/p WLE  12/ 2011 stage 1, localized per pt and no recurrence   History of narrow angle glaucoma    per pt s/p laser both eyes no issues since approx 2006   History of squamous cell carcinoma of skin    excision multiple area's   Hypertension    Hypogonadism in male    Malignant neoplasm prostate [facility]) 03/2021   urologist--- dr bell/  radiation oncology-- dr Kathrynn Running;  dx 02/ 2013m  Gleason 3+4   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 (HCC)    per cardiac CT 02-24-2020   42mm   OSA on CPAP    per pt uses nightly   Persistent atrial fibrillation [facility])    cardiologist--- dr Karie Schwalbe. [facility] Endo;   DCCV 02-22-2020 and 12-01-2014;  03-02-2020  s/p afib ablation by Dr  Johney Frame   Seasonal allergies    Wears hearing aid in both ears     Past Surgical History:  Procedure Laterality Date   A-FLUTTER ABLATION N/A 01/17/2022   Procedure: A-FLUTTER ABLATION;  Surgeon: Maurice Small, MD;  Location: [facility] INVASIVE CV LAB;  Service: Cardiovascular;  Laterality: N/A;   ATRIAL FIBRILLATION ABLATION N/A 03/02/2020   Procedure: ATRIAL FIBRILLATION ABLATION;  Surgeon: Hillis Range, MD;  Location: [facility] INVASIVE CV LAB;  Service: Cardiovascular;  Laterality: N/A;   CARDIOVERSION N/A 12/01/2014   Procedure: CARDIOVERSION;  Surgeon: Lennette Bihari, MD;  Location: [facility] ENDOSCOPY;  Service: Cardiovascular;  Laterality: N/A;   CARDIOVERSION N/A 02/22/2020   Procedure: CARDIOVERSION;  Surgeon: Jodelle Red, MD;  Location: [facility] ENDOSCOPY;  Service: Cardiovascular;  Laterality: N/A;   CATARACT EXTRACTION W/ INTRAOCULAR LENS IMPLANT Bilateral    2000; 2004   COLONOSCOPY WITH ESOPHAGOGASTRODUODENOSCOPY (EGD)  09/2019   CYSTOSCOPY W/ URETEROSCOPY W/ LITHOTRIPSY  05/12/2003   @[facility]    ELBOW SURGERY Left 1984   tendon repair left arm   GOLD SEED IMPLANT N/A 06/27/2021   Procedure: GOLD SEED IMPLANT;  Surgeon: Crista Elliot, MD;  Location: [facility];  Service: Urology;  Laterality: N/A;   INCISION AND DRAINAGE OF WOUND Left 08/28/2016   Procedure: IRRIGATION AND DEBRIDEMENT WOUND;  Surgeon: Bradly Bienenstock, MD;  Location: [facility] OR;  Service: Orthopedics;  Laterality: Left;   LIPOMA EXCISION Right 04/18/2010   right thigh   MASS EXCISION Left 07/2001   excision left axila mass (benign)   MELANOMA EXCISION  01/2000   @ [facility];   WLE of mid back for melanoma and excision resection lipoma upper back   METACARPAL OSTEOTOMY Left 08/28/2016   Procedure: 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.;  Surgeon: Bradly Bienenstock, MD;  Location: [facility] OR;  Service: Orthopedics;  Laterality: Left;   MICROLARYNGOSCOPY WITH LASER N/A 01/25/2015   Procedure: MICROLARYNGOSCOPY ;   Surgeon: Christia Reading, MD;  Location: [facility] OR;  Service: ENT;  Laterality: N/A;  Microlaryngoscopy with 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 with CO2 laser    SATURATION BIOPSY OF PROSTATE  01/18/2005   @[facility]   w/ anesthesia   SPACE OAR INSTILLATION N/A 06/27/2021   Procedure: SPACE OAR INSTILLATION;  Surgeon: Crista Elliot, MD;  Location: [facility];  Service: Urology;  Laterality: N/A;   TENDON REPAIR Right 2012   right  involving elbow, arm. hand   TONSILLECTOMY  1970    Family History  Problem Relation Age of Onset   Heart disease Mother    Stroke Father    Other Father        brain tumor-benign  Heart disease Sister    Heart disease Sister    Hypertension Sister    Colon cancer Neg Hx    Stomach cancer Neg Hx    Esophageal cancer Neg Hx    Pancreatic cancer Neg Hx    Liver disease Neg Hx     Social History   Tobacco Use   Smoking status: Former    Years: 30    Types: Cigarettes    Start date: 11/20/1976    Quit date: 1986    Years since quitting: 38.3   Smokeless tobacco: Never  Substance Use Topics   Alcohol use: Not Currently    Subjective:   Presents for yearly CPE; has been struggling with recurrent episodes of A.Fib- had to have ablation in December 2023; scheduled to see his cardiologist next week in follow up;  [facility] dermatology regularly; seeing urology every 3 months; up to date on eye doctor; does have hearing aids- wears on Sunday;   Review of Systems  Constitutional: Negative.   HENT: Negative.    Eyes: Negative.   Respiratory: Negative.    Cardiovascular: Negative.   Gastrointestinal: Negative.   Genitourinary: Negative.   Musculoskeletal: Negative.   Skin: Negative.   Neurological: Negative.   Endo/Heme/Allergies: Negative.   Psychiatric/Behavioral: Negative.       Objective:  Vitals:   06/11/22 1355 06/11/22 1432  BP: (!) 154/68 138/68  Pulse: 67   Resp: 19   SpO2: 97%   Weight: 197 lb 3.2 oz (89.4 kg)    Height: 6' (1.829 m)     General: Well developed, well nourished, in no acute distress  Skin : Warm and dry.  Head: Normocephalic and atraumatic  Eyes: Sclera and conjunctiva clear; pupils round and reactive to light; extraocular movements intact  Ears: External normal; canals clear; tympanic membranes normal  Oropharynx: Pink, supple. No suspicious lesions  Neck: Supple without thyromegaly, adenopathy  Lungs: Respirations unlabored; clear to auscultation bilaterally without wheeze, rales, rhonchi  CVS exam: normal rate and regular rhythm.  Abdomen: Soft; nontender; nondistended; normoactive bowel sounds; no masses or hepatosplenomegaly  Musculoskeletal: No deformities; no active joint inflammation  Extremities: No edema, cyanosis, clubbing  Vessels: Symmetric bilaterally  Neurologic: Alert and oriented; speech intact; face symmetrical; moves all extremities well; CNII-XII intact without focal deficit   Assessment:  1. PE (physical exam), annual   2. Lipid screening     Plan:  Age appropriate preventive healthcare needs addressed; encouraged regular eye doctor and dental exams; encouraged regular exercise; will update labs and refills as needed today; follow-up to be determined; Congratulated patient on commitment to his health; continue with cardiology, dermatology and urology as regularly scheduled;  No follow-ups on file.  Orders Placed This Encounter  Procedures   CBC with Differential/Platelet   Comp Met (CMET)   Lipid panel   Hemoglobin A1c    Requested Prescriptions    No prescriptions requested or ordered in this encounter

## 2022-06-12 LAB — COMPREHENSIVE METABOLIC PANEL
ALT: 12 U/L (ref 0–53)
AST: 20 U/L (ref 0–37)
Albumin: 4.1 g/dL (ref 3.5–5.2)
Alkaline Phosphatase: 63 U/L (ref 39–117)
BUN: 25 mg/dL — ABNORMAL HIGH (ref 6–23)
CO2: 22 mEq/L (ref 19–32)
Calcium: 9.4 mg/dL (ref 8.4–10.5)
Chloride: 106 mEq/L (ref 96–112)
Creatinine, Ser: 1.2 mg/dL (ref 0.40–1.50)
GFR: 56.65 mL/min — ABNORMAL LOW (ref >60.00)
Glucose, Bld: 82 mg/dL (ref 70–99)
Potassium: 4.4 mEq/L (ref 3.5–5.1)
Sodium: 139 mEq/L (ref 135–145)
Total Bilirubin: 0.6 mg/dL (ref 0.2–1.2)
Total Protein: 6.7 g/dL (ref 6.0–8.3)

## 2022-06-12 LAB — LIPID PANEL
Cholesterol: 146 mg/dL (ref 0–200)
HDL: 45.4 mg/dL (ref >39.00)
LDL Cholesterol: 74 mg/dL (ref 0–99)
NonHDL: 100.32
Total CHOL/HDL Ratio: 3
Triglycerides: 134 mg/dL (ref 0.0–149.0)
VLDL: 26.8 mg/dL (ref 0.0–40.0)

## 2022-06-12 LAB — CBC WITH DIFFERENTIAL/PLATELET
Basophils Absolute: 0.1 10*3/uL (ref 0.0–0.1)
Basophils Relative: 0.9 % (ref 0.0–3.0)
Eosinophils Absolute: 0.1 10*3/uL (ref 0.0–0.7)
Eosinophils Relative: 1.6 % (ref 0.0–5.0)
HCT: 44.3 % (ref 39.0–52.0)
Hemoglobin: 15.5 g/dL (ref 13.0–17.0)
Lymphocytes Relative: 13 % (ref 12.0–46.0)
Lymphs Abs: 1 10*3/uL (ref 0.7–4.0)
MCHC: 34.9 g/dL (ref 30.0–36.0)
MCV: 94.4 fl (ref 78.0–100.0)
Monocytes Absolute: 1.2 10*3/uL — ABNORMAL HIGH (ref 0.1–1.0)
Monocytes Relative: 15.4 % — ABNORMAL HIGH (ref 3.0–12.0)
Neutro Abs: 5.2 10*3/uL (ref 1.4–7.7)
Neutrophils Relative %: 69.1 % (ref 43.0–77.0)
Platelets: 202 10*3/uL (ref 150.0–400.0)
RBC: 4.7 Mil/uL (ref 4.22–5.81)
RDW: 15.3 % (ref 11.5–15.5)
WBC: 7.5 10*3/uL (ref 4.0–10.5)

## 2022-06-12 LAB — HEMOGLOBIN A1C: Hgb A1c MFr Bld: 5.4 % (ref 4.6–6.5)

## 2022-06-13 NOTE — Progress Notes (Signed)
Spoke with pt, pt is aware of results and expressed understanding. Pt requested a copy of his labs be mailed to him pt was advised I will place the letter up front to mail it to him.

## 2022-06-18 ENCOUNTER — Ambulatory Visit: Payer: Medicare Other | Attending: Cardiovascular Disease | Admitting: Cardiovascular Disease

## 2022-06-18 ENCOUNTER — Encounter: Payer: Self-pay | Admitting: Cardiovascular Disease

## 2022-06-18 DIAGNOSIS — I351 Nonrheumatic aortic (valve) insufficiency: Secondary | ICD-10-CM

## 2022-06-18 DIAGNOSIS — I484 Atypical atrial flutter: Secondary | ICD-10-CM | POA: Diagnosis not present

## 2022-06-18 DIAGNOSIS — I77810 Thoracic aortic ectasia: Secondary | ICD-10-CM

## 2022-06-18 DIAGNOSIS — I4819 Other persistent atrial fibrillation: Secondary | ICD-10-CM | POA: Diagnosis not present

## 2022-06-18 DIAGNOSIS — I10 Essential (primary) hypertension: Secondary | ICD-10-CM

## 2022-06-18 DIAGNOSIS — I358 Other nonrheumatic aortic valve disorders: Secondary | ICD-10-CM

## 2022-06-18 DIAGNOSIS — G4733 Obstructive sleep apnea (adult) (pediatric): Secondary | ICD-10-CM

## 2022-06-18 DIAGNOSIS — D6869 Other thrombophilia: Secondary | ICD-10-CM

## 2022-06-18 MED ORDER — CARVEDILOL 6.25 MG PO TABS: 6.2500 mg | ORAL_TABLET | Freq: Two times a day (BID) | ORAL | 3 refills | Status: DC

## 2022-06-18 NOTE — Patient Instructions (Signed)
Medication Instructions:  START Carvedilol 6.25 twice daily   *If you need a refill on your cardiac medications before your next appointment, please call your pharmacy*   Follow-Up: At [facility], you and your health needs are our priority.  As part of our continuing mission to provide you with exceptional heart care, we have created designated Provider Care Teams.  These Care Teams include your primary Cardiologist (physician) and Advanced Practice Providers (APPs -  Physician Assistants and Nurse Practitioners) who all work together to provide you with the care you need, when you need it.  We recommend signing up for the patient portal called "MyChart".  Sign up information is provided on this After Visit Summary.  MyChart is used to connect with patients for Virtual Visits (Telemedicine).  Patients are able to view lab/test results, encounter notes, upcoming appointments, etc.  Non-urgent messages can be sent to your provider as well.   To learn more about what you can do with MyChart, go to [email or web address].    Your next appointment:   6 month(s)  Provider:   Nicki Guadalajara, MD    Also, follow up with Dr.Mealor @ [address] office.

## 2022-06-18 NOTE — Progress Notes (Unsigned)
Patient ID: Mario [name], male   DOB: 01-14-1941, 82 y.o.   MRN: [id number]      Primary M.D.: Dr. Burnard Leigh   HPI:  Mario [name] is a 82 y.o. male who presents for a 3 month follow-up cardiology evaluation.  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  He underwent endoscopy by Dr. Melvia Heaps and was noted to have an irregular heart rhythm with  sinus rhythm with PVCs and PACs when connected to the monitor.  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  He had undergone a cardiac catheterization in approximately 1985.  Last year  noticed chest tightness when working out and also had noticed significant increasing shortness of breath.  He has been aware of a somewhat irregular rhythm.  Over several months.  In July he was recently treated with diverticulitis with Cipro and Flagyl and had noticed increased palpitations at that time.  When I saw for initial evaluation on 09/27/2014 he was in atrial fibrillation which he was completely unaware of.  On further questioning, he omitted that he snores very loudly and has had significant episodes of witnessed apnea.  He can fall asleep during the day at any time if circumstances permit.  He has a history of hypertension.  Recently he had been taking atenolol 150 mg in the morning had taken testosterone injections once a week.  I changed his atenolol to 100 mg in the morning and 50 mg grams at night.  He was significantly hypertensive and I added amlodipine 5 mg to his medical regimen.  I scheduled him for a nuclear perfusion study which was done in 10/07/2014.  There were no ECG changes.  There was a small defect of mild severity in the basal inferior wall.  It was felt most likely this was diaphragmatic attenuation.  Ejection fraction was 59%.  An echo Doppler study revealed an EF of  50-55% and raise the possibility of mild posterior basal hypokinesis.  He had mild AR and mild MR.  He was started on eliquis at his office visit.  On follow-up evaluation, his blood pressure was still elevated.  I increased his amlodipine to 7.5 mg in the morning and also started him on losartan 50 mg daily.  He underwent a sleep study which confirmed my suspicion for obstructive sleep apnea.  Overall sleep apnea was mild with an AHI of 12.3 , but events were moderate with supine sleep with an AHI of 22.8 per hour. He has significant oxygen desaturation to a nadir of 81% and on the initial study had reduced sleep efficiency at 61.8% and evidence for loud snoring.  He subsequently underwent a CPAP titration trial and he was titrated up to 8 cm water pressure with excellent result.  He underwent successful cardioversion for his atrial fibrillation  On 12/01/2014. He admits to one brief episode following the cardioversion while sleeping before CPAP therapy was initiated and this had reverted back to normal rhythm in the morning when he awakened  CPAP was initiated on November 14, 2014. He has a ResMed Airfit P10 nasalpillow mask, medium size. A download was reviewed from 12/12/2014 through 01/10/2015. His compliance is excellent at 100% of usage.  He is averaging 8 hours and 24 minutes.  His AHI was still mildly elevated at 6.5,  but significant improved initially.  There is no significant leak.  He had undergone biopsy of his laryngeal nodule, which was negative.  When I saw him in January 2017, he complained of several days of being back in atrial fibrillation which seem to be precipitated after prolonged sneezing spell.  When I saw him, with his recurrent AF and normal LV function.  I recommended initiation of flecanide at 50 mg twice a day.  He was advised to reduce his atenolol from 75 g twice a day to 50 mg twice a day for 2 days and on the third day to reduce his atenolol to 25 g twice a day.  After 2  doses of flecanide he felt that his heart rhythm had again stabilized.  A CPAP download  from January 24, 2015 through 02/19/2015 demonstrated 100% compliance.  He averaged 9 hours of sleep per night.  He is set at an auto mode and his 95th percentile pressure was 13.7 with a maximum 16.9.  AHI was 7.9.  Of note, one week ago when I saw the patient.  We discussed turning off his ramp since at the beginning of his sleep.  He was having some difficulty getting adequate air. I rechecked a download for the past week when the has been off, AHI was slightly improved at 5.8/hr.  He feels well.  He has more energy.  He is exercising regularly.  He believes his sleep is good quality.  He denies residual daytime sleepiness.  In April 2017 he had been under increased mental stress and felt that  he went back into atrial fibrillation for approximately 12 hours, but then ultimately his rhythm stabilized.  When I saw him 6 weeks ago, he was in sinus rhythm.  At that time I increased his flecainide to 75 mg twice a day.  I also scheduled him for routine treadmill test to make certain he did not have any flecanide induced proarrhythmia.  This did not demonstrate any exercise-induced arrhythmia.  He was noted to have upsloping ST segment depression of approximately 1 mm with rapid resolution in less than 1 minute in early recovery.  Previously, his nuclear perfusion study did not show any ischemia.  He feels that his mental stress has improved since his daughter seems to be reconciling with her husband and is no longer planning to go through a divorce.  He denies any chest pain or shortness of breath.    When I saw him in October 2018.  He was unaware of any recurrent episodes of atrial fibrillation.  However, at times he had experienced an occasional skipped beat.   He was cutting wood and accidentally cut off the distal aspect of his fourth finger of his left hand several months ago.  He also had been taking testosterone  prescribed by his urologist, but recently his hemoglobin had increased and he reduced his testosterone dose in half.  He has continued to be on flecainide 75 mg twice a day in addition to eliquis 5 mg twice a day for anticoagulation.  He also has been on amlodipine 7.5 mg, and losartan 100 mg in addition to atenolol 6.25 mg twice a day for hypertension.  He continues to use CPAP and cannot sleep without it.  In his words "a life changor."  His CPAP set up day was 03/04/2016.  A download was obtained in the office today from April 9 through 06/18/2017.  He is 100% compliant in sleeping 8 hours and 13 minutes.  He has a ResMed AirSense 10 AutoSet unit with a minimum pressure at 8 and maximum of 20.  95th percentile pressure was 12.6 with a maximum average pressure at 14.6.  AHI was 3.2 but there were days of significant leak.  He just recently got a new mask.  He admits to being under increased stress.  His dad is been in hospice.   His wife' dad unfortunately died on 10/06/17 at age 73, 1 month prior to turning 3. This resulted in increased stress to Mario [name].  His blood pressure was noted to be somewhat labile during this.[name]  He is still working out at least 3 days/week lifting weights.  He continues to use CPAP with 100% compliance.  He states blood pressure typically has been in the 140s.  He needs to transient ankle edema particularly if he stands up for long duration while singing in the choir.  He is unaware of any breakthrough atrial fibrillation or rhythm disturbance.    I saw him in December 2019 at which time he was maintaining sinus rhythm, was 1% compliant with CPAP therapy.  QTc interval was stable.  On March 11, 2018 He developed an episode of lightheadedness and recurrent atrial fibrillation and was evaluated in the emergency room.  His heart rate was not fast and he was found to have up to 2-second pause.  Atenolol was discontinued and flecainide was further increased to 100 mg twice a  day.  He underwent cardioversion in the emergency room with restoration of sinus rhythm.  He was subsequently seen on March 13, 2018 by Mammie Russian, PA-C and was noted to have frequent PACs and PVCs.  He was started back on low-dose beta-blocker therapy with carvedilol 3.125 mg twice a day.  When seen several weeks later on March 31, 2018 heart rate was stable.  ECG showed sinus rhythm and QTc interval was normal at 393 ms.  However hydralazine was added to his regimen for mild blood pressure elevation.   Since he was last seen by Almyra Deforest, PAC, he has felt well.  He has continued to be very active working out at [employer] and now that the gym is closed due to the curve at 19 pandemic, he has been active building a deck at his house.  He lives outside [place] on a 12 acre plot of land.   I evaluated hum in a telemedicine vsist on 05/12/2018.  He was unaware of any recurrent atrial fibrillation.  He denies any anginal type chest pain symptoms.  He continues to use CPAP with 100% compliance.  When asked how his blood pressures have been running he states typically his blood pressure runs in the 1 20-1 30 range with diastolics in the 57S to 17B.  He underwent a repeat echo Doppler study on April 30, 2018.  This shows normal systolic function with an EF of 60 to 65%.  There is mild to moderate biatrial enlargement.  There is evidence for mild aortic stenosis with a mean gradient of 10 and peak gradient of 18.  He had mild aortic root dilatation at 41 mm.    When I last saw him on December 27, 2020 Mario [name] has been back to the gym and  has been working out with significant weights. He had noticed increased heart rates after he had taken his first and second vaccination with PVCs. He denies any chest pain. He continues to be on amlodipine 10 mg, carvedilol 3.125 mg twice  a day, flecainide 100 mg twice a day, as well as losartan 100 mg daily. He is unaware of recurrent atrial fibrillation. He continues to use  CPAP with excellent compliance. I obtained a download in the office today from October 1 through December 27, 2019 with average CPAP use is 9 hours and 17 minutes. He has 100% compliant. His CPAP is set at a range of 8 to 20 cm and his AHI is 2.2. 95th percentile pressure is 12.4 with a maximum average pressure of 15.1.   He has had evaluations with Alphonzo Severance, PA and Dr. Johney Frame for atrial fibrillation after failing therapy with flecainide in January 2022.  He had been exercising heavily trying to get ready for a weightlifting competition.  He apparently underwent cardioversion in January 2022 and ablation by Dr. Johney Frame on March 02, 2020 for persistent atrial fibrillation at which time he was also found to have isthmus dependent right atrial flutter and multiple left atrial flutter circuits.  Subsequently he did develop a significant groin hematoma which ultimately stabilized.  I saw him on December 25, 2020 at which time he was unaware of any recurrent atrial fibrillation. He is now off flecainide and carvedilol but does admit to occasional palpitations.  He has lost approximately 20 pounds.  He is training for a benchpress competition.  He continues to use CPAP and a download today from October 13 through December 22, 2020 shows 100% compliance with average use at 8 hours and 56 minutes.  At his pressure range of 10 to 20 cm of water his 95th percentile pressure is 13.1 with maximum average pressure 15.0 and AHI 2.2/h.   He developed recurrent atrial flutter in September 2023 and flecainide was resumed.  He has been evaluated by Dr. Nelly Laurence and EP and in follow-up he remained in atypical flutter.  DC cardioversion was scheduled for November 06, 2021 but at the time he appeared to be in sinus rhythm with left bundle branch block.  He underwent atrial flutter ablation on January 17, 2021 which showed all 4 pulmonary veins were isolated.  His a flutter was atypical but he converted to atrial fibrillation  and complete mapping was not obtained.  According to Dr. Morrie Sheldon note on February 25, 2022 the flutter appears to be involving the roof and posterior wall and he was unable to deliver aggressive lesions on the posterior wall due to temperature rise.  I last saw him on March 20, 2022 at which time he remained relatively asymptomatic.  There had been discussion by Dr. Nelly Laurence for potential initiation of Tikosyn and he is uncertain if he wants to proceed with this.  He still lifts weights and at age 28 won his competition in weight lifting bench presses.  He denies any angina.  Presently he is taking carvedilol 6.25 mg twice a day, Eliquis 5 mg twice a day, hydralazine 25 mg twice a day and irbesartan 75 mg daily.  He is on pantoprazole for GERD.  He continues to use CPAP therapy and is in need for new DME company since his prior company is no longer in the CPAP business.  I obtained a download from January 8 through January 17, 2023 which continues to show excellent compliance with 8 hours and 20 minutes of use.  At his pressure range of 10 to 20 cm of water, AHI is 3.2 with 95th percentile pressure 13.3 and maximum average pressure at 15.2.    Since I last saw him, he has continued to  remain fairly stable.  He goes to the gym regularly.  His heart rate is well-controlled and he is unaware of any tachypalpitations.  He has issues with his right shoulder and left knee which have undergone injections.  He never pursued further evaluation with Dr. Nelly Laurence since he was significantly stressed with his wife's diagnosis of breast cancer and ongoing radiation treatment.  Presently, he is doing better.  He has recently been evaluated by Luberta Robertson, FNP for primary care last week on June 11, 2022..  Laboratory revealed hemoglobin A1c at 5.4.  CBC was stable.  Potassium 4.4.  Creatinine 1.20.  GFR 56.65 consistent with stage IIIa CKD.  Lipids were improved with total cholesterol 146, triglycerides 134, HDL 45, LDL 74.  He  is sleeping well and continues to use CPAP.  I obtained a download from April 4 through Jun 14, 2022 which shows 100% use with average use at 8 hours and 22 minutes.  His ResMed AirSense 11 AutoSet unit is set at a pressure range of 10 to 20 cm.  AHI is 5.3.  His 95th percentile pressure is 13.3 with maximum average pressure 15.0.  He presents for evaluation.  He presents for evaluation.   Past Medical History:  Diagnosis Date   Anticoagulant long-term use    eliquis--- managed by cardiology   Arthritis    neck   Benign localized prostatic hyperplasia with lower urinary tract symptoms (LUTS)    Blister    06-25-2021  per pt has a small blister at end of tailbone from doing sit-up's, covers w/ band-aid   ED (erectile dysfunction)    First degree heart block    History of adenomatous polyp of colon    History of basal cell carcinoma (BCC) of skin    excision multiple area's   History of kidney stones    History of malignant melanoma of back 2000   mid back  s/p WLE 12/ 2011 stage 1, localized per pt and no recurrence   History of narrow angle glaucoma    per pt s/p laser both eyes no issues since approx 2006   History of squamous cell carcinoma of skin    excision multiple area's   Hypertension    Hypogonadism in male    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 Tresa Endo;   DCCV 02-22-2020 and 12-01-2014;  03-02-2020  s/p afib ablation by Dr Johney Frame   Seasonal allergies    Wears hearing aid in both ears     Past Surgical History:  Procedure Laterality Date   A-FLUTTER ABLATION N/A 01/17/2022   Procedure: A-FLUTTER ABLATION;  Surgeon: Maurice Small, MD;  Location: [facility] INVASIVE CV LAB;  Service: Cardiovascular;  Laterality:  N/A;   ATRIAL FIBRILLATION ABLATION N/A 03/02/2020   Procedure: ATRIAL FIBRILLATION ABLATION;  Surgeon: Hillis Range, MD;  Location: [facility] INVASIVE CV LAB;  Service: Cardiovascular;  Laterality: N/A;   CARDIOVERSION N/A 12/01/2014   Procedure: CARDIOVERSION;  Surgeon: Lennette Bihari, MD;  Location: [facility] ENDOSCOPY;  Service: Cardiovascular;  Laterality: N/A;   CARDIOVERSION N/A 02/22/2020   Procedure: CARDIOVERSION;  Surgeon: Jodelle Red, MD;  Location: [facility] ENDOSCOPY;  Service: Cardiovascular;  Laterality: N/A;   CATARACT EXTRACTION W/ INTRAOCULAR LENS IMPLANT Bilateral    2000; 2004   COLONOSCOPY WITH ESOPHAGOGASTRODUODENOSCOPY (EGD)  09/2019   CYSTOSCOPY W/ URETEROSCOPY W/ LITHOTRIPSY  05/12/2003   @[facility]    ELBOW SURGERY Left 1984   tendon repair left arm   GOLD SEED IMPLANT N/A 06/27/2021   Procedure: GOLD SEED IMPLANT;  Surgeon: Crista Elliot, MD;  Location: [facility];  Service: Urology;  Laterality: N/A;   INCISION AND DRAINAGE OF WOUND Left 08/28/2016   Procedure: IRRIGATION AND DEBRIDEMENT WOUND;  Surgeon: Bradly Bienenstock, MD;  Location: [facility] OR;  Service: Orthopedics;  Laterality: Left;   LIPOMA EXCISION Right 04/18/2010   right thigh   MASS EXCISION Left 07/2001   excision left axila mass (benign)   MELANOMA EXCISION  01/2000   @ [facility];   WLE of mid back for melanoma and excision resection lipoma upper back   METACARPAL OSTEOTOMY Left 08/28/2016   Procedure: 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.;  Surgeon: Bradly Bienenstock, MD;  Location: [facility] OR;  Service: Orthopedics;  Laterality: Left;   MICROLARYNGOSCOPY WITH LASER N/A 01/25/2015   Procedure: MICROLARYNGOSCOPY ;  Surgeon: Christia Reading, MD;  Location: [facility] OR;  Service: ENT;  Laterality: N/A;  Microlaryngoscopy with 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 with CO2 laser    SATURATION BIOPSY OF PROSTATE  01/18/2005   @[facility]   w/ anesthesia   SPACE OAR INSTILLATION N/A 06/27/2021   Procedure: SPACE OAR  INSTILLATION;  Surgeon: Crista Elliot, MD;  Location: [facility];  Service: Urology;  Laterality: N/A;   TENDON REPAIR Right 2012   right  involving elbow, arm. hand   TONSILLECTOMY  1970    Allergies  Allergen Reactions   Contrast Media [Iodinated Contrast Media] Shortness Of Breath, Rash and Other (See Comments)    First noted after Coronary CTA 02/24/20. Pt experienced wheezing, shortness of breath and diffuse rash/welts over face and trunk.   Lisinopril Other (See Comments)    REACTION: severe GI upset requiring EGD--temporally related to lisinopril--tolerated micardis    Current Outpatient Medications  Medication Sig Dispense Refill   amLODipine (NORVASC) 10 MG tablet Take 1 tablet (10 mg total) by mouth daily. 90 tablet 1   apixaban (ELIQUIS) 5 MG TABS tablet Take 1 tablet by mouth twice daily 180 tablet 1   Carboxymethylcellulose Sodium (THERATEARS PF OP) Place 1 drop into both eyes daily as needed (dry eyes).     carvedilol (COREG) 3.125 MG tablet Take 2 tablets (6.25 mg total) by mouth in the morning AND 1 tablet (3.125 mg total) every evening. 270 tablet 2   fluticasone (FLONASE) 50 MCG/ACT nasal spray Place 2 sprays into both nostrils at bedtime as needed for allergies.     GLUCOSAMINE-CHONDROITIN PO Take 1 tablet by mouth daily.      hydrALAZINE (APRESOLINE) 25 MG tablet TAKE 1 TABLET BY MOUTH TWICE DAILY APPT  REQUIRED  FOR  FURTHER  REFILLS 180 tablet 0   irbesartan (AVAPRO) 75 MG tablet Take 1 tablet (75 mg total) by mouth daily. 90 tablet 3   Lutein 20 MG CAPS Take 20 mg by mouth daily.     METAMUCIL FIBER PO Take 1 Scoop by mouth 3 (three) times a week.     Multiple Vitamin (MULTIVITAMIN) tablet Take 1 tablet by mouth daily.     pantoprazole (PROTONIX) 40 MG tablet Take 1 tablet (40 mg total) by mouth daily. 30 tablet 0  Saw Palmetto, Serenoa repens, (SAW PALMETTO PO) Take 2 tablets by mouth daily.     testosterone cypionate (DEPOTESTOSTERONE  CYPIONATE) 200 MG/ML injection Inject 100 mg into the muscle every Wednesday.     No current facility-administered medications for this visit.    Social History   Socioeconomic History   Marital status: Married    Spouse name: Not on file   Number of children: 2   Years of education: Not on file   Highest education level: Not on file  Occupational History   Occupation: retired  Tobacco Use   Smoking status: Former    Years: 30    Types: Cigarettes    Start date: 11/20/1976    Quit date: 1986    Years since quitting: 38.3   Smokeless tobacco: Never  Vaping Use   Vaping Use: Never used  Substance and Sexual Activity   Alcohol use: Not Currently   Drug use: Never   Sexual activity: Not on file  Other Topics Concern   Not on file  Social History Narrative   Lives in [place].  Retired Theatre stage manager.  Owned a fitness center.  Weight lifter   Social Determinants of Health   Financial Resource Strain: Not on file  Food Insecurity: No Food Insecurity (10/08/2021)   Hunger Vital Sign    Worried About Running Out of Food in the Last Year: Never true    Ran Out of Food in the Last Year: Never true  Transportation Needs: No Transportation Needs (10/08/2021)   PRAPARE - Administrator, Civil Service (Medical): No    Lack of Transportation (Non-Medical): No  Physical Activity: Not on file  Stress: Not on file  Social Connections: Not on file  Intimate Partner Violence: Not on file   Additional social history is notable in that he is married for 43 years.  He has 2 children and 2 grandchildren.  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  He completed 12th grade of education.  Remotely he had smoked but quit in 1977.  He drinks 3 beers per day.  He has been exercising fairly regularly at least 4 days per week including weightlifting and cardio for~ 2 hours per session.  Family History  Problem Relation Age of Onset   Heart disease Mother     Stroke Father    Other Father        brain tumor-benign   Heart disease Sister    Heart disease Sister    Hypertension Sister    Colon cancer Neg Hx    Stomach cancer Neg Hx    Esophageal cancer Neg Hx    Pancreatic cancer Neg Hx    Liver disease Neg Hx    Family history is notable that his mother died at age 52 with heart disease and had atrial fibrillation and congestive heart failure.  Father died at 48 with a brain tumor.  He has 2 sisters and both have hypertension.   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 due to a sawing accident Hematologic/Oncologic: Negative; no easy bruising, bleeding Endocrine: Negative; no heat/cold intolerance; no  diabetes Neuro: Negative; no changes in balance, headaches Skin: Negative; No rashes or skin lesions Psychiatric: Negative; No behavioral problems, depression Sleep: Positive for previously loud snoring, frequent awakenings, nonrestorative sleep, and daytime sleepiness, hypersomnolence, now on CPAP therapy with resolution of symptoms; no bruxism, restless legs, hypnogagnic hallucinations Other comprehensive 14 point system review is negative   Physical Exam BP (!) 132/56 (BP Location: Left Arm, Patient Position: Sitting, Cuff Size: Normal)   Pulse 60   Ht 6' (1.829 m)   Wt 198 lb 9.6 oz (90.1 kg)   SpO2 96%   BMI 26.94 kg/m    Repeat blood pressure by me 124/66  Wt Readings from Last 3 Encounters:  06/18/22 198 lb 9.6 oz (90.1 kg)  06/11/22 197 lb 3.2 oz (89.4 kg)  03/20/22 201 lb 12.8 oz (91.5 kg)   General:  Alert, oriented, no distress.  Skin: normal turgor, no rashes, warm and dry HEENT: Normocephalic, atraumatic. Pupils equal round and reactive to light; sclera anicteric; extraocular muscles intact;  Nose without nasal septal hypertrophy Mouth/Parynx benign; Mallinpatti scale 3 Neck: No JVD, no carotid bruits; normal carotid upstroke Lungs: clear to ausculatation and percussion; no wheezing or rales Chest wall: without tenderness to palpitation Heart: PMI not displaced, irregular irregular consistent with atrial fibrillation with controlled rate at 60, s1 s2 normal, 1/6 systolic murmur, no diastolic murmur, no rubs, gallops, thrills, or heaves Abdomen: soft, nontender; no hepatosplenomehaly, BS+; abdominal aorta nontender and not dilated by palpation. Back: no CVA tenderness Pulses 2+ Musculoskeletal: full range of motion, normal strength, no joint deformities Extremities: no clubbing cyanosis or edema, Homan's sign negative  Neurologic: grossly nonfocal; Cranial nerves grossly wnl Psychologic: Normal mood and affect    Jun 18, 2022 ECG (independently read by me): Atrial fibrillation at 60  March 20, 2022 ECG (independently read by me):  Atrial fibrillation at 58  December 25, 2020 ECG (independently read by me): NSR at 78, normal intervals  November 2021 ECG (independently read by me): NSR at 68;nonspecific  T changes, normal interval with QTc 414 msec  December 2019 ECG (independently read by me): Sinus bradycardia 55 bpm.  No ectopy.  Normal intervals.  May 2019 ECG (independently read by me): Bradycardia 56 bpm.  Nonspecific interventricular conduction delay.  QTc interval not 391 ms  October 2018 ECG (independently read by me): Sinus bradycardia 56 bpm.  No ectopy.  Normal intervals per  06/05/2016 ECG (independently read by me): Normal sinus rhythm at 61 bpm.  Nonspecific T changes.  QTc interval 390 ms.  October 2017 ECG (independently read by me): Sinus bradycardia 57 bpm.   Nonspecific ST changes.  QTc interval 393 ms.  06/30/2015 ECG (independently read by me): Sinus bradycardia at 56 bpm.  No significant ST-T changes.  QTc interval 389 ms.  05/16/2015 ECG (independently read by me): Sinus bradycardia 59 bpm.  QTc interval 376 ms.  No significant ST changes.  02/23/2015 ECG (independently read by me): Sinus bradycardia 59 bpm.  QTc interval 390 ms.  Nonspecific T changes.  02/17/15 ECG (independently read by me): Atrial fibrillation at 58 bpm.  Nonspecific ST changes.  QTc interval 380 ms.  01/12/2015 ECG  (independently read by me):  Normal sinus rhythm at 62 bpm. Nonspecific ST-T changes.  September 2016 ECG (independently read by me): Atrial fibrillation with a slow ventricular response in the 50s.  QTc interval 364 ms.  Prior CG (independently read by me):  Atrial fibrillation with ventricular rate at 52 bpm.  No significant ST-T change.  09/27/2014 ECG (independently read by me): Atrial fibrillation with ventricular rate at 59 bpm.  Nonspecific ST-T changes.  LABS:     Latest Ref Rng & Units 06/11/2022    2:39 PM 01/17/2022   12:39 PM 12/13/2021   10:07 AM  BMP  Glucose 70 - 99 mg/dL 82  85  93   BUN 6 - 23 mg/dL 25  15  17    Creatinine 0.40 - 1.50 mg/dL 2.84  1.32  4.40   BUN/Creat Ratio 10 - 24   13   Sodium 135 - 145 mEq/L 139  136  138   Potassium 3.5 - 5.1 mEq/L 4.4  4.5  4.5   Chloride 96 - 112 mEq/L 106  105  102   CO2 19 - 32 mEq/L 22  23  25    Calcium 8.4 - 10.5 mg/dL 9.4  9.1  9.3         Latest Ref Rng & Units 06/11/2022    2:39 PM 03/30/2021   12:56 PM 05/09/2020   11:37 AM  Hepatic Function  Total Protein 6.0 - 8.3 g/dL 6.7  7.1  6.9   Albumin 3.5 - 5.2 g/dL 4.1  4.0  4.2   AST 0 - 37 U/L 20  25  20    ALT 0 - 53 U/L 12  22  14    Alk Phosphatase 39 - 117 U/L 63  69  68   Total Bilirubin 0.2 - 1.2 mg/dL 0.6  0.6  0.6        Latest Ref Rng & Units 06/11/2022    2:39 PM 01/17/2022   12:39 PM 12/04/2021   11:07 AM  CBC  WBC  4.0 - 10.5 K/uL 7.5  5.0  5.7   Hemoglobin 13.0 - 17.0 g/dL 10.2  72.5  36.6   Hematocrit 39.0 - 52.0 % 44.3  42.3  45.2   Platelets 150.0 - 400.0 K/uL 202.0  171  201    Lab Results  Component Value Date   MCV 94.4 06/11/2022   MCV 92.6 01/17/2022   MCV 96 12/04/2021   Lab Results  Component Value Date   TSH 3.360 06/13/2017   Lab Results  Component Value Date   HGBA1C 5.4 06/11/2022     BNP    Component Value Date/Time   BNP 250.0 (H) 03/11/2018 1113    ProBNP No results found for: "PROBNP"   Lipid Panel     Component Value Date/Time   CHOL 146 06/11/2022 1439   CHOL 155 06/13/2017 0951   TRIG 134.0 06/11/2022 1439   HDL 45.40 06/11/2022 1439   HDL 45 06/13/2017 0951   CHOLHDL 3 06/11/2022 1439   VLDL 26.8 06/11/2022 1439   LDLCALC 74 06/11/2022 1439   LDLCALC 84 06/13/2017 0951    RADIOLOGY: No results found.  IMPRESSION:  1. Essential hypertension   2. OSA on CPAP   3. Persistent atrial fibrillation (HCC)   4. Atypical atrial flutter (HCC)   5. Secondary hypercoagulable state (HCC)   6. Aortic valve sclerosis   7. Mild aortic insufficiency   8. Mild dilation of ascending aorta [facility])     ASSESSMENT AND PLAN: Mario [name] is an active young appearing 73 -year-old male who was found to be in atrial fibrillation in August 2016 and was started on anticoagulation.  He had undergone initial cardioversion in October 2016 and develop recurrent atrial fibrillation in January 2017 converted to sinus rhythm with  institution of flecanide.  He had been maintaining sinus rhythm with good blood pressure control on amlodipine, carvedilol, hydralazine and losartan in addition to his flecainide.  Unfortunately, in January 2022 he began to fail flecainide and developed recurrent persistent atrial fibrillation.  He ultimately underwent successful ablation by Dr. Johney Frame on March 02, 2020 at which time he was also found to have in addition to persistent A. fib isthmus  dependent right atrial flutter and multiple left atrial flutter circuits which were successfully ablated.  Subsequently,  flecainide and also carvedilol were discontinued. He subsequently developed typical atrial flutter and underwent attempted ablation by Dr. Nelly Laurence and during that procedure was not able to be fully mapped due to transitioning to atrial fibrillation.  When I saw him in follow-up in February 2024 he continued to be in atrial fibrillation with rate control and was essentially asymptomatic.  At that time he was under a fair amount of stress with a recent diagnosis of breast cancer with his wife who has now been undergoing radiation treatments.  Dr. Shirlyn Goltz had discussed the potential of Tikosyn therapy but Mario [name] was very concerned about potential reaction and in light of his wife's illness was not ready to consider that option.  Presently, he continues to be in atrial fibrillation with rate control at 60 bpm.  His blood pressure is stable.  He has been taking carvedilol 3.125 mg tablets 2 tablets twice a day and I will change him to a 6.25 mg dose where he will take 1 twice a day.  He continues to be on amlodipine 10 mg, hydralazine 25 mg twice a day, irbesartan 75 mg daily for blood pressure control.  He is anticoagulated on Eliquis 5 mg.  He continues to use CPAP with excellent compliance.  I reviewed his most recent download.  I will change his current settings to a range of 12 to 20 cm of water.  He will need to reestablish with a new DME company since [employer] is no longer in the CPAP business.  I have suggested that he see Dr. Nelly Laurence for follow-up EP evaluation and further discussion about potential future alternatives.  I will see him in 6 months for reevaluation or sooner as needed.    Lennette Bihari, MD, [facility] 06/20/2022 12:50 PM

## 2022-06-20 ENCOUNTER — Encounter: Payer: Self-pay | Admitting: Cardiovascular Disease

## 2022-07-30 ENCOUNTER — Other Ambulatory Visit: Payer: Self-pay | Admitting: Orthopaedic Surgery

## 2022-07-30 DIAGNOSIS — Z01818 Encounter for other preprocedural examination: Secondary | ICD-10-CM

## 2022-08-21 ENCOUNTER — Other Ambulatory Visit: Payer: Self-pay | Admitting: Cardiovascular Disease

## 2022-08-21 DIAGNOSIS — I4891 Unspecified atrial fibrillation: Secondary | ICD-10-CM

## 2022-08-21 NOTE — Telephone Encounter (Signed)
Prescription refill request for Eliquis received. Indication: PAF Last office visit: 06/18/22  Bishop Limbo MD Scr: 1.20 on 06/11/22  Epic Age: 82 Weight: 90.1kg  Based on above findings Eliquis 5mg  twice daily is the appropriate dose.  Refill approved.

## 2022-08-26 ENCOUNTER — Ambulatory Visit
Admission: RE | Admit: 2022-08-26 | Discharge: 2022-08-26 | Disposition: A | Discharge status: Home / Self Care | Payer: Medicare Other | Source: Ambulatory Visit | Attending: Orthopaedic Surgery | Admitting: Orthopaedic Surgery

## 2022-08-26 DIAGNOSIS — Z01818 Encounter for other preprocedural examination: Secondary | ICD-10-CM

## 2022-09-04 ENCOUNTER — Telehealth: Payer: Self-pay

## 2022-09-04 ENCOUNTER — Telehealth: Payer: Self-pay | Admitting: *Deleted

## 2022-09-04 NOTE — Telephone Encounter (Signed)
   Name: Mario [name]  DOB: 1940-08-15  MRN: [id number]  Primary Cardiologist: Nicki Guadalajara, MD   Preoperative team, please contact this patient and set up a phone call appointment for further preoperative risk assessment. Please obtain consent and complete medication review. Thank you for your help.  I confirm that guidance regarding antiplatelet and oral anticoagulation therapy has been completed and, if necessary, noted below.  From Pharmacy:  Per office protocol, patient can hold Eliquis for 3 days prior to procedure.     Joni Reining, NP 09/04/2022, 1:22 PM [facility]

## 2022-09-04 NOTE — Telephone Encounter (Signed)
Pt stated that they are waiting tilm pt gets some thinsg together at home. Surgery to be scheduled end of sept to mid oct. Med rec and consent done.     Patient Consent for Virtual Visit        Mario [name] has provided verbal consent on 09/04/2022 for a virtual visit (video or telephone).   CONSENT FOR VIRTUAL VISIT FOR:  Mario [name]  By participating in this virtual visit I agree to the following:  I hereby voluntarily request, consent and authorize [facility] and its employed or contracted physicians, physician assistants, nurse practitioners or other licensed health care professionals (the Practitioner), to provide me with telemedicine health care services (the "Services") as deemed necessary by the treating Practitioner. I acknowledge and consent to receive the Services by the Practitioner via telemedicine. I understand that the telemedicine visit will involve communicating with the Practitioner through live audiovisual communication technology and the disclosure of certain medical information by electronic transmission. I acknowledge that I have been given the opportunity to request an in-person assessment or other available alternative prior to the telemedicine visit and am voluntarily participating in the telemedicine visit.  I understand that I have the right to withhold or withdraw my consent to the use of telemedicine in the course of my care at any time, without affecting my right to future care or treatment, and that the Practitioner or I may terminate the telemedicine visit at any time. I understand that I have the right to inspect all information obtained and/or recorded in the course of the telemedicine visit and may receive copies of available information for a reasonable fee.  I understand that some of the potential risks of receiving the Services via telemedicine include:  Delay or interruption in medical evaluation due to technological equipment failure or  disruption; Information transmitted may not be sufficient (e.g. poor resolution of images) to allow for appropriate medical decision making by the Practitioner; and/or  In rare instances, security protocols could fail, causing a breach of personal health information.  Furthermore, I acknowledge that it is my responsibility to provide information about my medical history, conditions and care that is complete and accurate to the best of my ability. I acknowledge that Practitioner's advice, recommendations, and/or decision may be based on factors not within their control, such as incomplete or inaccurate data provided by me or distortions of diagnostic images or specimens that may result from electronic transmissions. I understand that the practice of medicine is not an exact science and that Practitioner makes no warranties or guarantees regarding treatment outcomes. I acknowledge that a copy of this consent can be made available to me via my patient portal [facility] MyChart), or I can request a printed copy by calling the office of [facility].    I understand that my insurance will be billed for this visit.   I have read or had this consent read to me. I understand the contents of this consent, which adequately explains the benefits and risks of the Services being provided via telemedicine.  I have been provided ample opportunity to ask questions regarding this consent and the Services and have had my questions answered to my satisfaction. I give my informed consent for the services to be provided through the use of telemedicine in my medical care

## 2022-09-04 NOTE — Telephone Encounter (Signed)
Pharmacy please advise on holding Eliquis prior to Right Reverse Shoulder Replacement  scheduled for TBD . Thank you.

## 2022-09-04 NOTE — Telephone Encounter (Signed)
Patient with diagnosis of afib on Eliquis for anticoagulation.    Procedure:  Right Reverse total shoulder replacement  Date of procedure: TBD   CHA2DS2-VASc Score = 4   This indicates a 4.8% annual risk of stroke. The patient's score is based upon: CHF History: 0 HTN History: 1 Diabetes History: 0 Stroke History: 0 Vascular Disease History: 1 Age Score: 2 Gender Score: 0      CrCl 61 ml/min  Per office protocol, patient can hold Eliquis for 3 days prior to procedure.    **This guidance is not considered finalized until pre-operative APP has relayed final recommendations.**

## 2022-09-04 NOTE — Telephone Encounter (Signed)
Pt stated that they are waiting tilm pt gets some thinsg together at home. Surgery to be scheduled end of sept to mid oct. Med rec and consent done.

## 2022-09-04 NOTE — Telephone Encounter (Signed)
   Pre-operative Risk Assessment    Patient Name: Mario [name]  DOB: 05-18-1940 MRN: [id number]      Request for Surgical Clearance    Procedure:   Right Reverse total shoulder replacement  Date of Surgery:  Clearance TBD                                 Surgeon:  Dr. Ramond Marrow Surgeon's Group or Practice Name:  [facility] Phone number:  [phone] Fax number:  [phone]   Type of Clearance Requested:   - Medical  - Pharmacy:  Hold Apixaban (Eliquis) Not Indicated   Type of Anesthesia:  General    Additional requests/questions:    Signed, Emmit Pomfret   09/04/2022, 7:44 AM

## 2022-09-23 ENCOUNTER — Encounter: Payer: Self-pay | Admitting: Cardiovascular Disease

## 2022-09-23 ENCOUNTER — Ambulatory Visit: Payer: Medicare Other | Attending: Cardiovascular Disease | Admitting: Cardiovascular Disease

## 2022-09-23 VITALS — BP 130/80 | HR 61 | Ht 72.0 in | Wt 199.4 lb

## 2022-09-23 DIAGNOSIS — I4819 Other persistent atrial fibrillation: Secondary | ICD-10-CM | POA: Diagnosis not present

## 2022-09-23 DIAGNOSIS — I484 Atypical atrial flutter: Secondary | ICD-10-CM

## 2022-09-23 NOTE — Progress Notes (Signed)
Cardiology Office Note:    Date:  09/23/2022   ID:  Mario [name], DOB 1940-12-16, MRN [id number]  PCP:  Olive Bass, FNP   [facility] Providers Cardiologist:  Nicki Guadalajara, MD Electrophysiologist:  Maurice Small, MD     Referring MD: Olive Bass,*   Chief complaint: fatigue, shortness of breath  History of Present Illness:    Mario [name] is a 82 y.o. male with a hx of sleep apnea, hypertension, atrial fibrillation who presents for electrophysiology follow-up.  He was diagnosed with atrial fibrillation in 2016 and maintained on flecainide.  He has been on Eliquis for CHA2DS2-VASc score of 4.  He is a former patient of Dr. Johney Frame, who performed a fib and flutter ablation in January 2022 after he failed flecainide.  He was noted to have recurrence of atrial flutter in September 2023, so flecainide was resumed.  In follow-up later in the month, the patient remained in atypical flutter.  DC cardioversion was scheduled for 11/06/2021. He appeared to be in sinus rhythm with LBBB at the time.  He reports that he was very fatigued and shortness of breath while on flecainide.  He returned in atypical flutter with increased fatigue.  He underwent ablation for atypical atrial flutter in 2023.  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 There was an atypical flutter, but unfortunately he converted to atrial fibrillation, so we were not able to map completely.  From the work that we were able to do, the flutter appeared to be involving the roof and posterior wall.  I did ablate a roofline and inferior posterior wall line.  Unfortunately, due to temperature rises, I was unable to deliver aggressive lesions on the posterior wall.   He returned with recurrence of atrial flutter, which was not surprising given that the ablation was limited by esophageal heating.  I recommended Tikosyn load, but he was hesitant.  At the time, his wife had recently been  diagnosed with cancer, and he was understandably concerned about her treatment plan and prognosis.  Additionally, he read on the Internet and was under the impression that the mortality rate was 9% (it's closer to 0.5%).  He returns today September 23, 2022 feeling great.  He has been working out again.  His energy levels are good.  His wife has completed radiation therapy and has an excellent prognosis, he reports.  He is in an atypical flutter today.    EKGs/Labs/Other Studies Reviewed:      EKG:  Last EKG results: atypical flutter, V-rate 110 bpm   Recent Labs: 06/11/2022: ALT 12; BUN 25; Creatinine, Ser 1.20; Hemoglobin 15.5; Platelets 202.0; Potassium 4.4; Sodium 139     Physical Exam:    VS:  BP 130/80 (BP Location: Left Arm, Patient Position: Sitting, Cuff Size: Large)   Pulse 61   Ht 6' (1.829 m)   Wt 199 lb 6.4 oz (90.4 kg)   SpO2 98%   BMI 27.04 kg/m     Wt Readings from Last 3 Encounters:  09/23/22 199 lb 6.4 oz (90.4 kg)  06/18/22 198 lb 9.6 oz (90.1 kg)  06/11/22 197 lb 3.2 oz (89.4 kg)     GEN: Well nourished, well developed in no acute distress CARDIAC: RRR, no murmurs, rubs, gallops RESPIRATORY:  Normal work of breathing MUSCULOSKELETAL: no edema    ASSESSMENT & PLAN:    Atypical atrial flutter:  s/p ablation by Dr. Johney Frame in 2022, and attempted roof and inferior posterior  wall line by me in  01/17/22 that was limited by esophageal heating.  Felt very fatigued on flecainide in the past He in in atypical flutter today with controlled rates, feeling great. We discussed management options including scheduling Tikosyn load, considering repeat ablation with pulse field ablation (which should not affect the esophagus), or continuing rhythm control. He would prefer a control approach at this time.  They were in great shape, he is 39 and doing very well for his age.  I think this is a reasonable approach.  Atrial fibrillation:  PVI from previous ablation  intact.  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  Continue eliquis   He is centering having a right shoulder surgery this fall.  He would like to follow-up with me again after his surgery.         Medication Adjustments/Labs and Tests Ordered: Current medicines are reviewed at length with the patient today.  Concerns regarding medicines are outlined above.  Orders Placed This Encounter  Procedures   EKG 12-Lead   No orders of the defined types were placed in this encounter.    Signed, Maurice Small, MD  09/23/2022 12:34 PM    [facility]

## 2022-09-23 NOTE — Patient Instructions (Signed)
Medication Instructions:  Your physician recommends that you continue on your current medications as directed. Please refer to the Current Medication list given to you today. *If you need a refill on your cardiac medications before your next appointment, please call your pharmacy*   Follow-Up: At [facility], you and your health needs are our priority.  As part of our continuing mission to provide you with exceptional heart care, we have created designated Provider Care Teams.  These Care Teams include your primary Cardiologist (physician) and Advanced Practice Providers (APPs -  Physician Assistants and Nurse Practitioners) who all work together to provide you with the care you need, when you need it.  We recommend signing up for the patient portal called "MyChart".  Sign up information is provided on this After Visit Summary.  MyChart is used to connect with patients for Virtual Visits (Telemedicine).  Patients are able to view lab/test results, encounter notes, upcoming appointments, etc.  Non-urgent messages can be sent to your provider as well.   To learn more about what you can do with MyChart, go to [email or web address].    Your next appointment:   4 month(s)  Provider:   York Pellant, MD

## 2022-10-28 ENCOUNTER — Ambulatory Visit: Payer: Medicare Other | Attending: Physician Assistant

## 2022-10-28 DIAGNOSIS — Z01810 Encounter for preprocedural cardiovascular examination: Secondary | ICD-10-CM | POA: Diagnosis not present

## 2022-10-28 NOTE — Progress Notes (Signed)
Virtual Visit via Telephone Note   Because of [name]'s co-morbid illnesses, he is at least at moderate risk for complications without adequate follow up.  This format is felt to be most appropriate for this patient at this time.  The patient did not have access to video technology/had technical difficulties with video requiring transitioning to audio format only (telephone).  All issues noted in this document were discussed and addressed.  No physical exam could be performed with this format.  Please refer to the patient's chart for his consent to telehealth for [facility].  Evaluation Performed:  Preoperative cardiovascular risk assessment _____________   Date:  10/28/2022   Patient ID:  Mario [name], DOB August 03, 1940, MRN [id number] Patient Location:  Home Provider location:   Office  Primary Care Provider:  Olive Bass, FNP Primary Cardiologist:  Nicki Guadalajara, MD  Chief Complaint / Patient Profile   82 y.o. y/o male with a h/o OSA, hypertension, atrial fibrillation on Eliquis with a CHA2DS2-VASc score of 4 who is pending right reverse total shoulder replacement and presents today for telephonic preoperative cardiovascular risk assessment.  History of Present Illness    Mario [name] is a 82 y.o. male who presents via audio/video conferencing for a telehealth visit today.  Pt was last seen in cardiology clinic on 09/23/2022 by Dr. Nelly Laurence.  At that time Mario [name] was doing well status post ablation for his atrial fibrillation in 2022. The patient is now pending procedure as outlined above. Since his last visit, he has been going in and out of his rhythm which is chronic. He has not felt well and his BP was a little high 150/73, 71 bpm. Took medications 131/74, 81 bpm. He is feeling better now. He is a power lifter. He set the state record for bench press. 365 when he was younger, 23 in the 50s.   Eliquis but rate controlled.He needs a right shoulder replacement  and lots of arthritis.   Per office protocol, patient can hold Eliquis for 3 days prior to procedure.    Past Medical History    Past Medical History:  Diagnosis Date   Anticoagulant long-term use    eliquis--- managed by cardiology   Arthritis    neck   Benign localized prostatic hyperplasia with lower urinary tract symptoms (LUTS)    Blister    06-25-2021  per pt has a small blister at end of tailbone from doing sit-up's, covers w/ band-aid   ED (erectile dysfunction)    First degree heart block    History of adenomatous polyp of colon    History of basal cell carcinoma (BCC) of skin    excision multiple area's   History of kidney stones    History of malignant melanoma of back 2000   mid back  s/p WLE 12/ 2011 stage 1, localized per pt and no recurrence   History of narrow angle glaucoma    per pt s/p laser both eyes no issues since approx 2006   History of squamous cell carcinoma of skin    excision multiple area's   Hypertension    Hypogonadism in male    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 Tresa Endo;   DCCV 02-22-2020 and 12-01-2014;  03-02-2020  s/p afib ablation by Dr Johney Frame   Seasonal allergies    Wears hearing aid in both ears    Past Surgical History:  Procedure Laterality Date   A-FLUTTER ABLATION N/A 01/17/2022   Procedure: A-FLUTTER ABLATION;  Surgeon: Mealor, Roberts Gaudy, MD;  [facility]: [facility];  Service: Cardiovascular;  Laterality: N/A;   ATRIAL FIBRILLATION ABLATION N/A 03/02/2020   Procedure: ATRIAL FIBRILLATION ABLATION;  Surgeon: Hillis Range, MD;  Location: [facility] INV[facility] CV LAB;  Service: Cardiovascular;  Laterality: N/A;   CARDIOVERSION N/A 12/01/2014   Procedure: CARDIOVERSION;   Surgeon: Lennette Bihari, MD;  Location: [facility] ENDOSCOPY;  Service: Cardiovascular;  Laterality: N/A;   CARDIOVERSION N/A 02/22/2020   Procedure: CARDIOVERSION;  Surgeon: Jodelle Red, MD;  Location: [facility] ENDOSCOPY;  Service: Cardiovascular;  Laterality: N/A;   CATARACT EXTRACTION W/ INTRAOCULAR LENS IMPLANT Bilateral    2000; 2004   COLONOSCOPY WITH ESOPHAGOGASTRODUODENOSCOPY (EGD)  09/2019   CYSTOSCOPY W/ URETEROSCOPY W/ LITHOTRIPSY  05/12/2003   @[facility]    ELBOW SURGERY Left 1984   tendon repair left arm   GOLD SEED IMPLANT N/A 06/27/2021   Procedure: GOLD SEED IMPLANT;  Surgeon: Crista Elliot, MD;  Location: [facility];  Service: Urology;  Laterality: N/A;   INCISION AND DRAINAGE OF WOUND Left 08/28/2016   Procedure: IRRIGATION AND DEBRIDEMENT WOUND;  Surgeon: Bradly Bienenstock, MD;  Location: [facility] OR;  Service: Orthopedics;  Laterality: Left;   LIPOMA EXCISION Right 04/18/2010   right thigh   MASS EXCISION Left 07/2001   excision left axila mass (benign)   MELANOMA EXCISION  01/2000   @ [facility];   WLE of mid back for melanoma and excision resection lipoma upper back   METACARPAL OSTEOTOMY Left 08/28/2016   Procedure: 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.;  Surgeon: Bradly Bienenstock, MD;  Location[facility] OR;  Service: Orthopedics;  Laterality: Left;   MICROLARYNGOSCOPY WITH LASER N/A 01/25/2015   Procedure: MICROLARYNGOSCOPY ;  Surgeon: Christia Reading, MD;  Location: [facility] OR;  Service: ENT;  Laterality: N/A;  Microlaryngoscopy with 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 with CO2 laser    SATURATION BIOPSY OF PROSTATE  01/18/2005   @[facility]   w/ anesthesia   SPACE OAR INSTILLATION N/A 06/27/2021   Procedure: SPACE OAR INSTILLATION;  Surgeon: Crista Elliot, MD;  Location: [facility];  Service: Urology;  Laterality: N/A;   TENDON REPAIR Right 2012   right  involving elbow, arm. hand   TONSILLECTOMY  1970    Allergies  Allergies  Allergen Reactions    Contrast Media [Iodinated Contrast Media] Shortness Of Breath, Rash and Other (See Comments)    First noted after Coronary CTA 02/24/20. Pt experienced wheezing, shortness of breath and diffuse rash/welts over face and trunk.   Lisinopril Other (See Comments)    REACTION: severe GI upset requiring EGD--temporally related to lisinopril--tolerated micardis    Home Medications    Prior to Admission medications   Medication Sig Start Date End Date Taking? Authorizing Provider  amLODipine (NORVASC) 10 MG tablet Take 1 tablet by mouth once daily 05/14/22   Lennette Bihari, MD  apixaban Everlene Balls) 5 MG TABS tablet Take 1 tablet by mouth twice daily 08/21/22   Lennette Bihari, MD  Carboxymethylcellulose Sodium (THERATEARS PF OP) Place 1 drop into both eyes daily as needed (dry eyes).    [provider]  carvedilol (COREG) 6.25  MG tablet Take 1 tablet (6.25 mg total) by mouth in the morning and at bedtime. 06/18/22   Lennette Bihari, MD  fluticasone (FLONASE) 50 MCG/ACT nasal spray Place 2 sprays into both nostrils at bedtime as needed for allergies. 09/09/20   [provider]  GLUCOSAMINE-CHONDROITIN PO Take 1 tablet by mouth daily.     [provider]  hydrALAZINE (APRESOLINE) 25 MG tablet Take 1 tablet (25 mg total) by mouth 2 (two) times daily. 05/14/22   Lennette Bihari, MD  irbesartan (AVAPRO) 75 MG tablet Take 1 tablet (75 mg total) by mouth daily. 12/05/21   Mealor, Roberts Gaudy, MD  Lutein 20 MG CAPS Take 20 mg by mouth daily.    [provider]  METAMUCIL FIBER PO Take 1 Scoop by mouth 3 (three) times a week.    [provider]  Multiple Vitamin (MULTIVITAMIN) tablet Take 1 tablet by mouth daily.    [provider]  pantoprazole (PROTONIX) 40 MG tablet Take 1 tablet (40 mg total) by mouth daily. 01/18/22 03/20/22  Sheilah Pigeon, PA-C  Saw Palmetto, Serenoa repens, (SAW PALMETTO PO) Take 2 tablets by mouth daily.    [provider]   testosterone cypionate (DEPOTESTOSTERONE CYPIONATE) 200 MG/ML injection Inject 100 mg into the muscle every Wednesday. 08/09/14   [provider]    Physical Exam    Vital Signs:  Mario [name] does not have vital signs available for review today.  Given telephonic nature of communication, physical exam is limited. AAOx3. NAD. Normal affect.  Speech and respirations are unlabored.  Accessory Clinical Findings    None  Assessment & Plan    1.  Preoperative Cardiovascular Risk Assessment:  Mario [name] perioperative risk of a major cardiac event is 0.4% according to the Revised Cardiac Risk Index (RCRI).  Therefore, he is at low risk for perioperative complications.   His functional capacity is excellent at 7.53 METs according to the [facility] Activity Status Index (DASI). Recommendations: According to ACC/AHA guidelines, no further cardiovascular testing needed.  The patient may proceed to surgery at acceptable risk.   Antiplatelet and/or Anticoagulation Recommendations:  Eliquis (Apixaban) can be held for 3 days prior to surgery.  Please resume post op when felt to be safe.     Time:   Today, I have spent 12 minutes with the patient with telehealth technology discussing medical history, symptoms, and management plan.     Sharlene Dory, PA-C  10/28/2022, 8:09 AM

## 2022-11-21 NOTE — H&P (Signed)
PREOPERATIVE H&P  Chief Complaint: right shoulder OA  HPI: Mario [name] is a 82 y.o. male who is scheduled for Procedure(s): REVERSE SHOULDER ARTHROPLASTY.   Patient has a past medical history significant for HTN, OSA on CPAP, a fib on Eliquis.   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  He is here to talk about bilateral shoulder replacements.  He has been told he had shoulder arthritis.  His right shoulder has been worse on x-ray; however, he is more symptomatic on the left.  He has had injections which have worked well but he now wants to know what his next options are.  Symptoms are rated as moderate to severe, and have been worsening.  This is significantly impairing activities of daily living.    Please see clinic note for further details on this patient's care.    He has elected for surgical management.   Past Medical History:  Diagnosis Date   Anticoagulant long-term use    eliquis--- managed by cardiology   Arthritis    neck   Benign localized prostatic hyperplasia with lower urinary tract symptoms (LUTS)    Blister    06-25-2021  per pt has a small blister at end of tailbone from doing sit-up's, covers w/ band-aid   ED (erectile dysfunction)    First degree heart block    History of adenomatous polyp of colon    History of basal cell carcinoma (BCC) of skin    excision multiple area's   History of kidney stones    History of malignant melanoma of back 2000   mid back  s/p WLE 12/ 2011 stage 1, localized per pt and no recurrence   History of narrow angle glaucoma    per pt s/p laser both eyes no issues since approx 2006   History of squamous cell carcinoma of skin    excision multiple area's   Hypertension    Hypogonadism in male    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 Tresa Endo;   DCCV 02-22-2020 and 12-01-2014;  03-02-2020  s/p afib ablation by Dr Johney Frame   Seasonal allergies    Wears hearing aid in both ears    Past Surgical History:  Procedure Laterality Date   A-FLUTTER ABLATION N/A 01/17/2022   Procedure: A-FLUTTER ABLATION;  Surgeon: Maurice Small, MD;  Location: [facility] INVASIVE CV LAB;  Service: Cardiovascular;  Laterality: N/A;   ATRIAL FIBRILLATION ABLATION N/A 03/02/2020   Procedure: ATRIAL FIBRILLATION ABLATION;  Surgeon: Hillis Range, MD;  Location: [facility]VASIVE CV LAB;  Service: Cardiovascular;  Laterality: N/A;   CARDIOVERSION N/A 12/01/2014   Procedure: CARDIOVERSION;  Surgeon: Lennette Bihari, MD;  Location: [facility] ENDOSCOPY;  Service: Cardiovascular;  Laterality: N/A;   CARDIOVERSION N/A 02/22/2020   Procedure: CARDIOVERSION;  Surgeon: Jodelle Red, MD;  Location: [facility] ENDOSCOPY;  Service: Cardiovascular;  Laterality: N/A;   CATARACT EXTRACTION W/ INTRAOCULAR LENS IMPLANT Bilateral    2000; 2004   COLONOSCOPY WITH ESOPHAGOGASTRODUODENOSCOPY (EGD)  09/2019   CYSTOSCOPY W/ URETEROSCOPY W/ LITHOTRIPSY  05/12/2003   @[facility]    ELBOW SURGERY Left 1984   tendon  repair left arm   GOLD SEED IMPLANT N/A 06/27/2021   Procedure: GOLD SEED IMPLANT;  Surgeon: Crista Elliot, MD;  Location: [facility];  Service: Urology;  Laterality: N/A;   INCISION AND DRAINAGE OF WOUND Left 08/28/2016   Procedure: IRRIGATION AND DEBRIDEMENT WOUND;  Surgeon: Bradly Bienenstock, MD;  Location: [facility] OR;  Service: Orthopedics;  Laterality: Left;   LIPOMA EXCISION Right 04/18/2010   right thigh   MASS EXCISION Left 07/2001   excision left axila mass (benign)   MELANOMA EXCISION  01/2000   @ [facility];   WLE of mid back for melanoma and excision resection lipoma upper back   METACARPAL OSTEOTOMY Left 08/28/2016   Procedure: revision  amputation of left 4th finger.;  Surgeon: Bradly Bienenstock, MD;  Location: [facility] OR;  Service: Orthopedics;  Laterality: Left;   MICROLARYNGOSCOPY WITH LASER N/A 01/25/2015   Procedure: MICROLARYNGOSCOPY ;  Surgeon: Christia Reading, MD;  Location: [facility] OR;  Service: ENT;  Laterality: N/A;  Microlaryngoscopy with 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 hand   TONSILLECTOMY  1970   Social History   Socioeconomic History   Marital status: Married    Spouse name: Not on file   Number of children: 2   Years of education: Not on file   Highest education level: Not on file  Occupational History   Occupation: retired  Tobacco Use   Smoking status: Former    Current packs/day: 0.00    Types: Cigarettes    Start date: 11/20/1976    Quit date: 1986    Years since quitting: 38.8   Smokeless tobacco: Never  Vaping Use   Vaping status: Never Used  Substance and Sexual Activity   Alcohol use: Not Currently   Drug use: Never   Sexual activity: Not on file  Other Topics Concern   Not on file  Social History Narrative   Lives in [place].  Retired Theatre stage manager.  Owned a fitness center.  Weight lifter   Social Determinants of Health   Financial Resource Strain: Not on file  Food Insecurity: No Food Insecurity (10/08/2021)   Hunger Vital Sign    Worried About Running Out of Food in the Last Year: Never true    Ran Out of Food in the Last Year: Never true  Transportation Needs: No Transportation Needs (10/08/2021)   PRAPARE - Administrator, Civil Service (Medical): No    Lack of Transportation (Non-Medical): No  Physical Activity: Not on file  Stress: Not on  file  Social Connections: Not on file   Family History  Problem Relation Age of Onset   Heart disease Mother    Stroke Father    Other Father        brain tumor-benign   Heart disease Sister    Heart disease Sister    Hypertension Sister    Colon cancer Neg Hx    Stomach cancer Neg Hx    Esophageal cancer Neg Hx    Pancreatic cancer Neg Hx    Liver disease Neg Hx    Allergies  Allergen Reactions  Contrast Media [Iodinated Contrast Media] Shortness Of Breath, Rash and Other (See Comments)    First noted after Coronary CTA 02/24/20. Pt experienced wheezing, shortness of breath and diffuse rash/welts over face and trunk.   Lisinopril Other (See Comments)    REACTION: severe GI upset requiring EGD--temporally related to lisinopril--tolerated micardis   Prior to Admission medications   Medication Sig Start Date End Date Taking? Authorizing Provider  amLODipine (NORVASC) 10 MG tablet Take 1 tablet by mouth once daily 05/14/22   Lennette Bihari, MD  apixaban Everlene Balls) 5 MG TABS tablet Take 1 tablet by mouth twice daily 08/21/22   Lennette Bihari, MD  Carboxymethylcellulose Sodium (THERATEARS PF OP) Place 1 drop into both eyes daily as needed (dry eyes).    [provider]  carvedilol (COREG) 6.25 MG tablet Take 1 tablet (6.25 mg total) by mouth in the morning and at bedtime. 06/18/22   Lennette Bihari, MD  fluticasone (FLONASE) 50 MCG/ACT nasal spray Place 2 sprays into both nostrils at bedtime as needed for allergies. 09/09/20   [provider]  GLUCOSAMINE-CHONDROITIN PO Take 1 tablet by mouth daily.     [provider]  hydrALAZINE (APRESOLINE) 25 MG tablet Take 1 tablet (25 mg total) by mouth 2 (two) times daily. 05/14/22   Lennette Bihari, MD  irbesartan (AVAPRO) 75 MG tablet Take 1 tablet (75 mg total) by mouth daily. 12/05/21   Mealor, Roberts Gaudy, MD  Lutein 20 MG CAPS Take 20 mg by mouth daily.    [provider]  METAMUCIL FIBER PO Take 1 Scoop by  mouth 3 (three) times a week.    [provider]  Multiple Vitamin (MULTIVITAMIN) tablet Take 1 tablet by mouth daily.    [provider]  pantoprazole (PROTONIX) 40 MG tablet Take 1 tablet (40 mg total) by mouth daily. 01/18/22 03/20/22  Sheilah Pigeon, PA-C  Saw Palmetto, Serenoa repens, (SAW PALMETTO PO) Take 2 tablets by mouth daily.    [provider]  testosterone cypionate (DEPOTESTOSTERONE CYPIONATE) 200 MG/ML injection Inject 100 mg into the muscle every Wednesday. 08/09/14   [provider]    ROS: All other systems have been reviewed and were otherwise negative with the exception of those mentioned in the HPI and as above.  Physical Exam: General: Alert, no acute distress Cardiovascular: No pedal edema Respiratory: No cyanosis, no use of accessory musculature GI: No organomegaly, abdomen is soft and non-tender Skin: No lesions in the area of chief complaint Neurologic: Sensation intact distally Psychiatric: Patient is competent for consent with normal mood and affect Lymphatic: No axillary or cervical lymphadenopathy  MUSCULOSKELETAL:  The range of motion of the bilateral shoulders is to 160. External rotation to 30.  Internal rotation to the beltline. He has 4/5 cuff strength on the right, intact on the left.   Imaging: CT reviewed demonstrating anteriorization of the humeral head consistent with likely cuff failure anterior and superior.   BMI: Estimated body mass index is 27.04 kg/m as calculated from the following:   Height as of 09/23/22: 6' (1.829 m).   Weight as of 09/23/22: 90.4 kg.  Lab Results  Component Value Date   ALBUMIN 4.1 06/11/2022   Diabetes: Patient does not have a diagnosis of diabetes. Lab Results  Component Value Date   HGBA1C 5.4 06/11/2022     Smoking Status:       Assessment: right shoulder OA  Plan: Plan for Procedure(s): REVERSE SHOULDER ARTHROPLASTY  The  risks benefits and alternatives were  discussed with the patient including but not limited to the risks of nonoperative treatment, versus surgical intervention including infection, bleeding, nerve injury,  blood clots, cardiopulmonary complications, morbidity, mortality, among others, and they were willing to proceed.   We additionally specifically discussed risks of axillary nerve injury, infection, periprosthetic fracture, continued pain and longevity of implants prior to beginning procedure.    Patient will be closely monitored in PACU for medical stabilization and pain control. If found stable in PACU, patient may be discharged home with outpatient follow-up. If any concerns regarding patient's stabilization patient will be admitted for observation after surgery. The patient is planning to be discharged home with outpatient PT.   The patient acknowledged the explanation, agreed to proceed with the plan and consent was signed.   He received operative clearance from his cardiologist, Dr. Tresa Endo, his PCP, Dr. Dayton Scrape, and his urologist, Dr. Alvester Morin.  Operative Plan: Right reverse total shoulder arthroplasty Discharge Medications: Tylenol, Oxycodone, Zofran DVT Prophylaxis: Resume Eliquis Physical Therapy: Outpatient PT Special Discharge needs: Sling (should bring with him). IceMan   Vernetta Honey, PA-C  11/21/2022 3:11 PM

## 2022-11-28 ENCOUNTER — Encounter (HOSPITAL_BASED_OUTPATIENT_CLINIC_OR_DEPARTMENT_OTHER): Payer: Self-pay | Admitting: Orthopaedic Surgery

## 2022-12-02 ENCOUNTER — Encounter (HOSPITAL_BASED_OUTPATIENT_CLINIC_OR_DEPARTMENT_OTHER)
Admission: RE | Admit: 2022-12-02 | Discharge: 2022-12-02 | Disposition: A | Discharge status: Home / Self Care | Payer: Medicare Other | Source: Ambulatory Visit | Attending: Orthopaedic Surgery | Admitting: Orthopaedic Surgery

## 2022-12-02 DIAGNOSIS — Z01818 Encounter for other preprocedural examination: Secondary | ICD-10-CM | POA: Insufficient documentation

## 2022-12-02 LAB — SURGICAL PCR SCREEN
MRSA, PCR: NEGATIVE
Staphylococcus aureus: NEGATIVE

## 2022-12-02 NOTE — Discharge Instructions (Addendum)
Ramond Marrow MD, MPH Alfonse Alpers, PA-C [place] [facility] [address], [address] [phone] (tel)   [phone] (fax)   POST-OPERATIVE INSTRUCTIONS - TOTAL SHOULDER REPLACEMENT    WOUND CARE You may leave the operative dressing in place until your follow-up appointment. KEEP THE INCISIONS CLEAN AND DRY. There may be a small amount of fluid/bleeding leaking at the surgical site. This is normal after surgery.  If it fills with liquid or blood please call us immediately to change it for you. Use the provided ice machine or Ice packs as often as possible for the first 3-4 days, then as needed for pain relief.   Keep a layer of cloth or a shirt between your skin and the cooling unit to prevent frost bite as it can get very cold.  SHOWERING: - You may shower on Post-Op Day #2.  - The dressing is water resistant but do not scrub it as it may start to peel up.   - You may remove the sling for showering - Gently pat the area dry.  - Do not soak the shoulder in water.  - Do not go swimming in the pool or ocean until your incision has completely healed (about 4-6 weeks after surgery) - KEEP THE INCISIONS CLEAN AND DRY.  EXERCISES Wear the sling at all times  You may remove the sling for showering, but keep the arm across the chest or in a secondary sling.    Accidental/Purposeful External Rotation and shoulder flexion (reaching behind you) is to be avoided at all costs for the first month. It is ok to come out of your sling if your are sitting and have assistance for eating.   Do not lift anything heavier than 1 pound until we discuss it further in clinic.  It is normal for your fingers/hand to become more swollen after surgery and discolored from bruising.   This will resolve over the first few weeks usually after surgery. Please continue to ambulate and do not stay sitting or lying for too long.  Perform foot and wrist pumps to assist in circulation.  PHYSICAL  THERAPY - You will begin physical therapy soon after surgery (unless otherwise specified) - Please call to set up an appointment, if you do not already have one  - Let our office if there are any issues with scheduling your therapy  - You have a physical therapy appointment scheduled at [facility] (across the hall from our office) on 10/28  REGIONAL ANESTHESIA (NERVE BLOCKS) The anesthesia team may have performed a nerve block for you this is a great tool used to minimize pain.   The block may start wearing off overnight (between 8-24 hours postop) When the block wears off, your pain may go from nearly zero to the pain you would have had postop without the block. This is an abrupt transition but nothing dangerous is happening.   This can be a challenging period but utilize your as needed pain medications to try and manage this period. We suggest you use the pain medication the first night prior to going to bed, to ease this transition.  You may take an extra dose of narcotic when this happens if needed   POST-OP MEDICATIONS- Multimodal approach to pain control In general your pain will be controlled with a combination of substances.  Prescriptions unless otherwise discussed are electronically sent to your pharmacy.  This is a carefully made plan we use to minimize narcotic use.  Acetaminophen - Non-narcotic pain medicine taken on a scheduled basis  Oxycodone - This is a strong narcotic, to be used only on an "as needed" basis for SEVERE pain. 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 Resume 24 hours after surgery  Omeprazole - daily medicine to protect your stomach while taking anti-inflammatories.   Zofran -  take as needed for nausea   FOLLOW-UP If you develop a Fever (>101.5), Redness or Drainage from the surgical incision site, please call our office to arrange for an evaluation. Please call the office to schedule a follow-up appointment for a wound check, 7-10 days  post-operatively.  IF YOU HAVE ANY QUESTIONS, PLEASE FEEL FREE TO CALL OUR OFFICE.  HELPFUL INFORMATION  Your arm will be in a sling following surgery. You will be in this sling for the next 4 weeks.   You may be more comfortable sleeping in a semi-seated position the first few nights following surgery.  Keep a pillow propped under the elbow and forearm for comfort.  If you have a recliner type of chair it might be beneficial.  If not that is fine too, but it would be helpful to sleep propped up with pillows behind your operated shoulder as well under your elbow and forearm.  This will reduce pulling on the suture lines.  When dressing, put your operative arm in the sleeve first.  When getting undressed, take your operative arm out last.  Loose fitting, button-down shirts are recommended.  In most states it is against the law to drive while your arm is in a sling. And certainly against the law to drive while taking narcotics.  You may return to work/school in the next couple of days when you feel up to it. Desk work and typing in the sling is fine.  We suggest you use the pain medication the first night prior to going to bed, in order to ease any pain when the anesthesia wears off. You should avoid taking pain medications on an empty stomach as it will make you nauseous.  You should wean off your narcotic medicines as soon as you are able.     Most patients will be off narcotics before their first postop appointment.   Do not drink alcoholic beverages or take illicit drugs when taking pain medications.  Pain medication may make you constipated.  Below are a few solutions to try in this order: Decrease the amount of pain medication if you aren't having pain. Drink lots of decaffeinated fluids. Drink prune juice and/or each dried prunes  If the first 3 don't work start with additional solutions Take Colace - an over-the-counter stool softener Take Senokot - an over-the-counter  laxative Take Miralax - a stronger over-the-counter laxative   Dental Antibiotics:  In most cases prophylactic antibiotics for Dental procdeures after total joint surgery are not necessary.  Exceptions are as follows:  1. History of prior total joint infection  2. Severely immunocompromised (Organ Transplant, cancer chemotherapy, Rheumatoid biologic meds such as Humira)  3. Poorly controlled diabetes (A1C &gt; 8.0, blood glucose over 200)  If you have one of these conditions, contact your surgeon for an antibiotic prescription, prior to your dental procedure.   For more information including helpful videos and documents visit our website:   [email or web address]     UltraSling Information:  Detach shoulder strap buckles and open front panel.        Position elbow in the sling as far back as possible 2.  Place shoulder strap over opposite shoulder       Connect shoulder strap to the sling using the quick release buckles. 3.   Secure strap at the top. It should go over your forearm and attach to the other side.        The thumb strap attaches to the front of the sling. It goes between your thumb and index finger. 5.   The pillow should be at your waistline.        Your sling will Velcro to the pillow.        Buckle the waist strap to the pillow and adjust the strap as needed.   Here is a video you can watch:  [email or web address]   Post Anesthesia Home Care Instructions  Activity: Get plenty of rest for the remainder of the day. A responsible individual must stay with you for 24 hours following the procedure.  For the next 24 hours, DO NOT: -Drive a car -Advertising copywriter -Drink alcoholic beverages -Take any medication unless instructed by your physician -Make any legal decisions or sign important papers.  Meals: Start with liquid foods such as gelatin or soup. Progress to regular foods as tolerated. Avoid  greasy, spicy, heavy foods. If nausea and/or vomiting occur, drink only clear liquids until the nausea and/or vomiting subsides. Call your physician if vomiting continues.  Special Instructions/Symptoms: Your throat may feel dry or sore from the anesthesia or the breathing tube placed in your throat during surgery. If this causes discomfort, gargle with warm salt water. The discomfort should disappear within 24 hours.  If you had a scopolamine patch placed behind your ear for the management of post- operative nausea and/or vomiting:  1. The medication in the patch is effective for 72 hours, after which it should be removed.  Wrap patch in a tissue and discard in the trash. Wash hands thoroughly with soap and water. 2. You may remove the patch earlier than 72 hours if you experience unpleasant side effects which may include dry mouth, dizziness or visual disturbances. 3. Avoid touching the patch. Wash your hands with soap and water after contact with the patch.    Regional Anesthesia Blocks  1. You may not be able to move or feel the "blocked" extremity after a regional anesthetic block. This may last may last from 3-48 hours after placement, but it will go away. The length of time depends on the medication injected and your individual response to the medication. As the nerves start to wake up, you may experience tingling as the movement and feeling returns to your extremity. If the numbness and inability to move your extremity has not gone away after 48 hours, please call your surgeon.   2. The extremity that is blocked will need to be protected until the numbness is gone and the strength has returned. Because you cannot feel it, you will need to take extra care to avoid injury. Because it may be weak, you may have difficulty moving it or using it. You may not know what position it is in without looking at it while the block is in effect.  3. For blocks in the legs and feet, returning to weight  bearing and walking needs to be done carefully. You will need to wait until the numbness is entirely gone and the strength has returned. You should be able to move your leg and foot normally before you try and bear weight or walk. You will need someone to be with you when  you first try to ensure you do not fall and possibly risk injury.  4. Bruising and tenderness at the needle site are common side effects and will resolve in a few days.  5. Persistent numbness or new problems with movement should be communicated to the surgeon or the [facility] [phone] [facility] [phone]).Information for Discharge Teaching:  EXPAREL (bupivacaine liposome injectable suspension)   Pain relief is important to your recovery. The goal is to control your pain so you can move easier and return to your normal activities as soon as possible after your procedure. Your physician may use several types of medicines to manage pain, swelling, and more.  Your surgeon or anesthesiologist gave you EXPAREL(bupivacaine) to help control your pain after surgery.  EXPAREL is a local anesthetic designed to release slowly over an extended period of time to provide pain relief by numbing the tissue around the surgical site. EXPAREL is designed to release pain medication over time and can control pain for up to 72 hours. Depending on how you respond to EXPAREL, you may require less pain medication during your recovery. EXPAREL can help reduce or eliminate the need for opioids during the first few days after surgery when pain relief is needed the most. EXPAREL is not an opioid and is not addictive. It does not cause sleepiness or sedation.   Important! A teal colored band has been placed on your arm with the date, time and amount of EXPAREL you have received. Please leave this armband in place for the full 96 hours following administration, and then you may remove the band. If you return to the  hospital for any reason within 96 hours following the administration of EXPAREL, the armband provides important information that your health care providers to know, and alerts them that you have received this anesthetic.    Possible side effects of EXPAREL: Temporary loss of sensation or ability to move in the area where medication was injected. Nausea, vomiting, constipation Rarely, numbness and tingling in your mouth or lips, lightheadedness, or anxiety may occur. Call your doctor right away if you think you may be experiencing any of these sensations, or if you have other questions regarding possible side effects.  Follow all other discharge instructions given to you by your surgeon or nurse. Eat a healthy diet and drink plenty of water or other fluids.  *May have Tylenol today at 1pm

## 2022-12-02 NOTE — Progress Notes (Signed)
Surgical soap given with instructions, pt verbalized understanding. Benzoyl peroxide gel given with instructions, pt verbalized understanding.  

## 2022-12-04 NOTE — Anesthesia Preprocedure Evaluation (Signed)
Anesthesia Evaluation  Patient identified by MRN, date of birth, ID band Patient awake    Reviewed: Allergy & Precautions, NPO status , Patient's Chart, lab work & pertinent test results  History of Anesthesia Complications Negative for: history of anesthetic complications  Airway Mallampati: III  TM Distance: >3 FB Neck ROM: Full   Comment: Previous grade I view with MAC 4, 2-handed mask Dental  (+) Dental Advisory Given   Pulmonary neg shortness of breath, sleep apnea and Continuous Positive Airway Pressure Ventilation , neg COPD, neg recent URI, former smoker   Pulmonary exam normal breath sounds clear to auscultation       Cardiovascular hypertension (amlodipine, carvedilol, hydralazine, irbesartan), Pt. on medications pulmonary hypertension (mild)(-) angina + CAD  (-) Past MI, (-) Cardiac Stents and (-) CABG + dysrhythmias (s/p CV and ablation, 1st degree AV block) Atrial Fibrillation + Valvular Problems/Murmurs (mild) AI  Rhythm:Irregular Rate:Normal  Mild ascending aortic dilatation, moderate pericardial effusion seen on echo without signs of tamponade  TTE 01/07/2022: IMPRESSIONS     1. Left ventricular ejection fraction, by estimation, is 60 to 65%. The  left ventricle has normal function. The left ventricle has no regional  wall motion abnormalities. There is mild left ventricular hypertrophy.  Left ventricular diastolic parameters  are indeterminate.   2. Right ventricular systolic function is normal. The right ventricular  size is moderately enlarged. There is mildly elevated pulmonary artery  systolic pressure. The estimated right ventricular systolic pressure is  36.7 mmHg.   3. Left atrial size was severely dilated.   4. Right atrial size was moderately dilated.   5. Moderate pericardial effusion, primarily located adjacent to RV/RA,  best seen on subcostal view. There is no evidence of cardiac tamponade.   6.  The mitral valve is normal in structure. Trivial mitral valve  regurgitation.   7. The aortic valve is tricuspid. Aortic valve regurgitation is mild.  Aortic valve sclerosis is present, with no evidence of aortic valve  stenosis.   8. Aortic dilatation noted. There is dilatation of the ascending aorta,  measuring 42 mm.   9. The inferior vena cava is dilated in size with >50% respiratory  variability, suggesting right atrial pressure of 8 mmHg.     Neuro/Psych neg Seizures negative neurological ROS     GI/Hepatic negative GI ROS, Neg liver ROS,,,  Endo/Other  negative endocrine ROS    Renal/GU Renal disease (h/o stones)   H/o prostate cancer    Musculoskeletal  (+) Arthritis , Osteoarthritis,    Abdominal   Peds  Hematology negative hematology ROS (+) Lab Results      Component                Value               Date                      WBC                      7.5                 06/11/2022                HGB                      15.5                06/11/2022  HCT                      44.3                06/11/2022                MCV                      94.4                06/11/2022                PLT                      202.0               06/11/2022              Anesthesia Other Findings Last Eliquis: 12/01/2022  Reproductive/Obstetrics                             Anesthesia Physical Anesthesia Plan  ASA: 3  Anesthesia Plan: General   Post-op Pain Management: Tylenol PO (pre-op)*   Induction: Intravenous  PONV Risk Score and Plan: 2 and Ondansetron, Dexamethasone and Treatment may vary due to age or medical condition  Airway Management Planned: Oral ETT  Additional Equipment:   Intra-op Plan:   Post-operative Plan: Extubation in OR  Informed Consent: I have reviewed the patients History and Physical, chart, labs and discussed the procedure including the risks, benefits and alternatives for the proposed  anesthesia with the patient or authorized representative who has indicated his/her understanding and acceptance.     Dental advisory given  Plan Discussed with: CRNA and Anesthesiologist  Anesthesia Plan Comments: (Discussed potential risks of nerve blocks including, but not limited to, infection, bleeding, nerve damage, seizures, pneumothorax, respiratory depression, and potential failure of the block. Alternatives to nerve blocks discussed. All questions answered.  Risks of general anesthesia discussed including, but not limited to, sore throat, hoarse voice, chipped/damaged teeth, injury to vocal cords, nausea and vomiting, allergic reactions, lung infection, heart attack, stroke, and death. All questions answered. )       Anesthesia Quick Evaluation

## 2022-12-05 ENCOUNTER — Ambulatory Visit (HOSPITAL_BASED_OUTPATIENT_CLINIC_OR_DEPARTMENT_OTHER): Payer: Medicare Other | Admitting: Anesthesiology

## 2022-12-05 ENCOUNTER — Other Ambulatory Visit: Payer: Self-pay

## 2022-12-05 ENCOUNTER — Ambulatory Visit (HOSPITAL_BASED_OUTPATIENT_CLINIC_OR_DEPARTMENT_OTHER)
Admission: RE | Admit: 2022-12-05 | Discharge: 2022-12-05 | Disposition: A | Discharge status: Home / Self Care | Payer: Medicare Other | Attending: Orthopaedic Surgery | Admitting: Orthopaedic Surgery

## 2022-12-05 ENCOUNTER — Encounter (HOSPITAL_BASED_OUTPATIENT_CLINIC_OR_DEPARTMENT_OTHER)
Admission: RE | Disposition: A | Discharge status: Home / Self Care | Payer: Self-pay | Source: Home / Self Care | Attending: Orthopaedic Surgery

## 2022-12-05 ENCOUNTER — Ambulatory Visit (HOSPITAL_COMMUNITY): Payer: Medicare Other

## 2022-12-05 ENCOUNTER — Encounter (HOSPITAL_BASED_OUTPATIENT_CLINIC_OR_DEPARTMENT_OTHER): Payer: Self-pay | Admitting: Orthopaedic Surgery

## 2022-12-05 DIAGNOSIS — I440 Atrioventricular block, first degree: Secondary | ICD-10-CM | POA: Diagnosis not present

## 2022-12-05 DIAGNOSIS — M19011 Primary osteoarthritis, right shoulder: Secondary | ICD-10-CM | POA: Insufficient documentation

## 2022-12-05 DIAGNOSIS — G4733 Obstructive sleep apnea (adult) (pediatric): Secondary | ICD-10-CM | POA: Insufficient documentation

## 2022-12-05 DIAGNOSIS — I2510 Atherosclerotic heart disease of native coronary artery without angina pectoris: Secondary | ICD-10-CM | POA: Diagnosis not present

## 2022-12-05 DIAGNOSIS — I4819 Other persistent atrial fibrillation: Secondary | ICD-10-CM | POA: Diagnosis not present

## 2022-12-05 DIAGNOSIS — Z8546 Personal history of malignant neoplasm of prostate: Secondary | ICD-10-CM | POA: Diagnosis not present

## 2022-12-05 DIAGNOSIS — Z7901 Long term (current) use of anticoagulants: Secondary | ICD-10-CM | POA: Diagnosis not present

## 2022-12-05 DIAGNOSIS — Z87891 Personal history of nicotine dependence: Secondary | ICD-10-CM | POA: Insufficient documentation

## 2022-12-05 DIAGNOSIS — I10 Essential (primary) hypertension: Secondary | ICD-10-CM | POA: Diagnosis not present

## 2022-12-05 DIAGNOSIS — Z01818 Encounter for other preprocedural examination: Secondary | ICD-10-CM

## 2022-12-05 DIAGNOSIS — Z85828 Personal history of other malignant neoplasm of skin: Secondary | ICD-10-CM | POA: Insufficient documentation

## 2022-12-05 HISTORY — PX: REVERSE SHOULDER ARTHROPLASTY: SHX5054

## 2022-12-05 SURGERY — ARTHROPLASTY, SHOULDER, TOTAL, REVERSE
Anesthesia: General | Site: Shoulder | Laterality: Right

## 2022-12-05 MED ORDER — FENTANYL CITRATE (PF) 100 MCG/2ML IJ SOLN
INTRAMUSCULAR | Status: DC | PRN
  Administered 2022-12-05: 50 ug via INTRAVENOUS

## 2022-12-05 MED ORDER — GLYCOPYRROLATE PF 0.2 MG/ML IJ SOSY
PREFILLED_SYRINGE | INTRAMUSCULAR | Status: AC
  Filled 2022-12-05: qty 1

## 2022-12-05 MED ORDER — PHENYLEPHRINE HCL-NACL 20-0.9 MG/250ML-% IV SOLN
INTRAVENOUS | Status: DC | PRN
  Administered 2022-12-05: 50 ug/min via INTRAVENOUS

## 2022-12-05 MED ORDER — ROCURONIUM BROMIDE 100 MG/10ML IV SOLN
INTRAVENOUS | Status: DC | PRN
  Administered 2022-12-05: 60 mg via INTRAVENOUS
  Administered 2022-12-05: 20 mg via INTRAVENOUS

## 2022-12-05 MED ORDER — EPINEPHRINE PF 1 MG/ML IJ SOLN
INTRAMUSCULAR | Status: AC
  Filled 2022-12-05: qty 1

## 2022-12-05 MED ORDER — MIDAZOLAM HCL 2 MG/2ML IJ SOLN
INTRAMUSCULAR | Status: AC
  Filled 2022-12-05: qty 2

## 2022-12-05 MED ORDER — LACTATED RINGERS IV SOLN: INTRAVENOUS | Status: DC

## 2022-12-05 MED ORDER — ACETAMINOPHEN 500 MG PO TABS
1000.0000 mg | ORAL_TABLET | Freq: Three times a day (TID) | ORAL | 0 refills | Status: AC
Start: 2022-12-05 — End: 2022-12-19

## 2022-12-05 MED ORDER — OXYCODONE HCL 5 MG PO TABS: ORAL_TABLET | ORAL | 0 refills | Status: AC

## 2022-12-05 MED ORDER — LIDOCAINE HCL (CARDIAC) PF 100 MG/5ML IV SOSY
PREFILLED_SYRINGE | INTRAVENOUS | Status: DC | PRN
  Administered 2022-12-05: 60 mg via INTRAVENOUS

## 2022-12-05 MED ORDER — GLYCOPYRROLATE 0.2 MG/ML IJ SOLN
INTRAMUSCULAR | Status: DC | PRN
  Administered 2022-12-05: .2 mg via INTRAVENOUS

## 2022-12-05 MED ORDER — FENTANYL CITRATE (PF) 100 MCG/2ML IJ SOLN
INTRAMUSCULAR | Status: AC
  Filled 2022-12-05: qty 2

## 2022-12-05 MED ORDER — OXYCODONE HCL 5 MG PO TABS: 5.0000 mg | ORAL_TABLET | Freq: Once | ORAL | Status: DC | PRN

## 2022-12-05 MED ORDER — TRANEXAMIC ACID-NACL 1000-0.7 MG/100ML-% IV SOLN
INTRAVENOUS | Status: AC
  Filled 2022-12-05: qty 100

## 2022-12-05 MED ORDER — FENTANYL CITRATE (PF) 100 MCG/2ML IJ SOLN
100.0000 ug | Freq: Once | INTRAMUSCULAR | Status: AC
  Administered 2022-12-05: 100 ug via INTRAVENOUS

## 2022-12-05 MED ORDER — GABAPENTIN 300 MG PO CAPS
ORAL_CAPSULE | ORAL | Status: AC
  Filled 2022-12-05: qty 1

## 2022-12-05 MED ORDER — OXYCODONE HCL 5 MG/5ML PO SOLN: 5.0000 mg | Freq: Once | ORAL | Status: DC | PRN

## 2022-12-05 MED ORDER — 0.9 % SODIUM CHLORIDE (POUR BTL) OPTIME
TOPICAL | Status: DC | PRN
  Administered 2022-12-05: 1000 mL

## 2022-12-05 MED ORDER — ROCURONIUM BROMIDE 10 MG/ML (PF) SYRINGE
PREFILLED_SYRINGE | INTRAVENOUS | Status: AC
  Filled 2022-12-05: qty 10

## 2022-12-05 MED ORDER — CEFAZOLIN SODIUM-DEXTROSE 2-4 GM/100ML-% IV SOLN
2.0000 g | INTRAVENOUS | Status: AC
  Administered 2022-12-05: 2 g via INTRAVENOUS

## 2022-12-05 MED ORDER — DEXAMETHASONE SODIUM PHOSPHATE 10 MG/ML IJ SOLN
INTRAMUSCULAR | Status: AC
Start: 2022-12-05 — End: ?
  Filled 2022-12-05: qty 1

## 2022-12-05 MED ORDER — AMISULPRIDE (ANTIEMETIC) 5 MG/2ML IV SOLN: 10.0000 mg | Freq: Once | INTRAVENOUS | Status: DC | PRN

## 2022-12-05 MED ORDER — PROPOFOL 10 MG/ML IV BOLUS
INTRAVENOUS | Status: AC
  Filled 2022-12-05: qty 20

## 2022-12-05 MED ORDER — CEFAZOLIN SODIUM-DEXTROSE 2-4 GM/100ML-% IV SOLN
INTRAVENOUS | Status: AC
  Filled 2022-12-05: qty 100

## 2022-12-05 MED ORDER — SUGAMMADEX SODIUM 200 MG/2ML IV SOLN
INTRAVENOUS | Status: DC | PRN
  Administered 2022-12-05: 200 mg via INTRAVENOUS

## 2022-12-05 MED ORDER — VANCOMYCIN HCL 1000 MG IV SOLR
INTRAVENOUS | Status: DC | PRN
  Administered 2022-12-05: 1000 mg via TOPICAL

## 2022-12-05 MED ORDER — EPHEDRINE 5 MG/ML INJ
INTRAVENOUS | Status: AC
  Filled 2022-12-05: qty 5

## 2022-12-05 MED ORDER — EPHEDRINE SULFATE (PRESSORS) 50 MG/ML IJ SOLN
INTRAMUSCULAR | Status: DC | PRN
  Administered 2022-12-05: 5 mg via INTRAVENOUS
  Administered 2022-12-05: 10 mg via INTRAVENOUS

## 2022-12-05 MED ORDER — BUPIVACAINE LIPOSOME 1.3 % IJ SUSP
INTRAMUSCULAR | Status: DC | PRN
  Administered 2022-12-05: 10 mL via PERINEURAL

## 2022-12-05 MED ORDER — LIDOCAINE 2% (20 MG/ML) 5 ML SYRINGE
INTRAMUSCULAR | Status: AC
  Filled 2022-12-05: qty 5

## 2022-12-05 MED ORDER — PROPOFOL 10 MG/ML IV BOLUS
INTRAVENOUS | Status: DC | PRN
  Administered 2022-12-05: 150 mg via INTRAVENOUS

## 2022-12-05 MED ORDER — FENTANYL CITRATE (PF) 100 MCG/2ML IJ SOLN: 25.0000 ug | INTRAMUSCULAR | Status: DC | PRN

## 2022-12-05 MED ORDER — ONDANSETRON HCL 4 MG/2ML IJ SOLN
INTRAMUSCULAR | Status: DC | PRN
  Administered 2022-12-05: 4 mg via INTRAVENOUS

## 2022-12-05 MED ORDER — DEXAMETHASONE SODIUM PHOSPHATE 10 MG/ML IJ SOLN
INTRAMUSCULAR | Status: DC | PRN
  Administered 2022-12-05: 10 mg via INTRAVENOUS

## 2022-12-05 MED ORDER — TRANEXAMIC ACID-NACL 1000-0.7 MG/100ML-% IV SOLN
1000.0000 mg | INTRAVENOUS | Status: AC
  Administered 2022-12-05: 1000 mg via INTRAVENOUS

## 2022-12-05 MED ORDER — VANCOMYCIN HCL 1000 MG IV SOLR
INTRAVENOUS | Status: AC
  Filled 2022-12-05: qty 20

## 2022-12-05 MED ORDER — ACETAMINOPHEN 500 MG PO TABS
ORAL_TABLET | ORAL | Status: AC
  Filled 2022-12-05: qty 2

## 2022-12-05 MED ORDER — ONDANSETRON HCL 4 MG/2ML IJ SOLN
INTRAMUSCULAR | Status: AC
  Filled 2022-12-05: qty 2

## 2022-12-05 MED ORDER — SODIUM CHLORIDE (PF) 0.9 % IJ SOLN
INTRAMUSCULAR | Status: DC | PRN
  Administered 2022-12-05: 1000 mL

## 2022-12-05 MED ORDER — GABAPENTIN 300 MG PO CAPS
300.0000 mg | ORAL_CAPSULE | Freq: Once | ORAL | Status: AC
  Administered 2022-12-05: 300 mg via ORAL

## 2022-12-05 MED ORDER — ONDANSETRON HCL 4 MG PO TABS: 4.0000 mg | ORAL_TABLET | Freq: Three times a day (TID) | ORAL | 0 refills | Status: AC | PRN

## 2022-12-05 MED ORDER — ACETAMINOPHEN 500 MG PO TABS
1000.0000 mg | ORAL_TABLET | Freq: Once | ORAL | Status: AC
  Administered 2022-12-05: 1000 mg via ORAL

## 2022-12-05 MED ORDER — BUPIVACAINE HCL (PF) 0.5 % IJ SOLN
INTRAMUSCULAR | Status: DC | PRN
  Administered 2022-12-05: 10 mL via PERINEURAL

## 2022-12-05 SURGICAL SUPPLY — 70 items
ADH SKN CLS APL DERMABOND .7 (GAUZE/BANDAGES/DRESSINGS) ×1
AID PSTN UNV HD RSTRNT DISP (MISCELLANEOUS) ×1
APL PRP STRL LF DISP 70% ISPRP (MISCELLANEOUS) ×1
BASEPLATE GLENOID STD REV 42 (Joint) IMPLANT
BASEPLATE SHOULDER FW 15D 29 (Joint) IMPLANT
BIT DRILL 3.2 PERIPHERAL SCREW (BIT) IMPLANT
BLADE SAW SGTL 73X25 THK (BLADE) ×2 IMPLANT
BLADE SURG 10 STRL SS (BLADE) IMPLANT
BLADE SURG 15 STRL LF DISP TIS (BLADE) IMPLANT
BLADE SURG 15 STRL SS (BLADE) ×1
BRUSH SCRUB EZ PLAIN DRY (MISCELLANEOUS) ×2 IMPLANT
BSPLAT GLND 15D 29 FULL WDG (Joint) ×1 IMPLANT
CHLORAPREP W/TINT 26 (MISCELLANEOUS) ×2 IMPLANT
CLSR STERI-STRIP ANTIMIC 1/2X4 (GAUZE/BANDAGES/DRESSINGS) ×2 IMPLANT
COOLER ICEMAN CLASSIC (MISCELLANEOUS) ×2 IMPLANT
COVER BACK TABLE 60X90IN (DRAPES) ×2 IMPLANT
COVER MAYO STAND STRL (DRAPES) ×2 IMPLANT
CUP HUM REV SHLD 3/4 42 +0 (Cup) IMPLANT
DERMABOND ADVANCED .7 DNX12 (GAUZE/BANDAGES/DRESSINGS) IMPLANT
DRAPE IMP U-DRAPE 54X76 (DRAPES) IMPLANT
DRAPE INCISE IOBAN 66X45 STRL (DRAPES) ×2 IMPLANT
DRAPE POUCH INSTRU U-SHP 10X18 (DRAPES) ×2 IMPLANT
DRAPE U-SHAPE 76X120 STRL (DRAPES) ×4 IMPLANT
DRSG AQUACEL AG ADV 3.5X 6 (GAUZE/BANDAGES/DRESSINGS) ×2 IMPLANT
ELECT BLADE 4.0 EZ CLEAN MEGAD (MISCELLANEOUS) ×1
ELECT REM PT RETURN 9FT ADLT (ELECTROSURGICAL) ×1
ELECTRODE BLDE 4.0 EZ CLN MEGD (MISCELLANEOUS) ×2 IMPLANT
ELECTRODE REM PT RTRN 9FT ADLT (ELECTROSURGICAL) ×2 IMPLANT
FACESHIELD WRAPAROUND (MASK) ×2
FACESHIELD WRAPAROUND OR TEAM (MASK) ×4 IMPLANT
GAUZE XEROFORM 1X8 LF (GAUZE/BANDAGES/DRESSINGS) IMPLANT
GLOVE BIO SURGEON STRL SZ 6.5 (GLOVE) ×4 IMPLANT
GLOVE BIOGEL PI IND STRL 6.5 (GLOVE) ×2 IMPLANT
GLOVE BIOGEL PI IND STRL 8 (GLOVE) ×2 IMPLANT
GLOVE ECLIPSE 8.0 STRL XLNG CF (GLOVE) ×4 IMPLANT
GOWN STRL REUS W/ TWL LRG LVL3 (GOWN DISPOSABLE) ×4 IMPLANT
GOWN STRL REUS W/TWL LRG LVL3 (GOWN DISPOSABLE) ×2
GOWN STRL REUS W/TWL XL LVL3 (GOWN DISPOSABLE) ×2 IMPLANT
GUIDE PIN 3X75 SHOULDER (PIN) ×1
GUIDEWIRE GLENOID 2.5X220 (WIRE) IMPLANT
HANDPIECE INTERPULSE COAX TIP (DISPOSABLE) ×1
KIT SHOULDER STAB MARCO (KITS) ×2 IMPLANT
MANIFOLD NEPTUNE II (INSTRUMENTS) ×2 IMPLANT
PACK BASIN DAY SURGERY FS (CUSTOM PROCEDURE TRAY) ×2 IMPLANT
PACK SHOULDER (CUSTOM PROCEDURE TRAY) ×2 IMPLANT
PAD COLD SHLDR WRAP-ON (PAD) ×2 IMPLANT
PIN GUIDE 3X75 SHOULDER (PIN) IMPLANT
RESTRAINT HEAD UNIVERSAL NS (MISCELLANEOUS) ×2 IMPLANT
SCREW 5.5X22 (Screw) IMPLANT
SCREW 5.5X26 (Screw) IMPLANT
SCREW CENTRAL THREAD 6.5X45 (Screw) IMPLANT
SCREW PERIPHERAL 5.0X34 (Screw) IMPLANT
SET HNDPC FAN SPRY TIP SCT (DISPOSABLE) ×2 IMPLANT
SHEET MEDIUM DRAPE 40X70 STRL (DRAPES) ×2 IMPLANT
SLEEVE SCD COMPRESS KNEE MED (STOCKING) ×2 IMPLANT
SPIKE FLUID TRANSFER (MISCELLANEOUS) IMPLANT
SPONGE T-LAP 18X18 ~~LOC~~+RFID (SPONGE) ×2 IMPLANT
STEM HUMERAL STD SHORT SZ3 (Joint) IMPLANT
SUT ETHIBOND 2 V 37 (SUTURE) ×2 IMPLANT
SUT ETHIBOND NAB CT1 #1 30IN (SUTURE) ×2 IMPLANT
SUT ETHILON 3 0 PS 1 (SUTURE) IMPLANT
SUT FIBERWIRE #5 38 CONV NDL (SUTURE) ×2
SUT MNCRL AB 4-0 PS2 18 (SUTURE) ×2 IMPLANT
SUT VIC AB 0 CT1 27 (SUTURE)
SUT VIC AB 0 CT1 27XBRD ANBCTR (SUTURE) IMPLANT
SUT VIC AB 3-0 SH 27 (SUTURE) ×2
SUT VIC AB 3-0 SH 27X BRD (SUTURE) ×2 IMPLANT
SUTURE FIBERWR #5 38 CONV NDL (SUTURE) ×4 IMPLANT
TOWEL GREEN STERILE FF (TOWEL DISPOSABLE) ×6 IMPLANT
TUBE SUCTION HIGH CAP CLEAR NV (SUCTIONS) ×2 IMPLANT

## 2022-12-05 NOTE — Interval H&P Note (Signed)
All questions answered, patient wants to proceed with procedure. ? ?

## 2022-12-05 NOTE — Op Note (Signed)
Orthopaedic Surgery Operative Note (CSN: [id number])  Mario [name]  1940-08-18 Date of Surgery: 12/05/2022   Diagnoses:  Right cuff tear arthropathy  Procedure: Right lateralized reverse total Shoulder Arthroplasty   Operative Finding Successful completion of planned procedure.  Patient had quite strong bone quality which may drilling his screw holes relatively difficult, that said his fixation was good, tug test normal.  Post-operative plan: The patient will be NWB in sling.  The patient will be will be discharged from PACU if continues to be stable as was plan prior to surgery.  DVT prophylaxis not indicated as patient already on full dose anticoagulant.  Pain control with PRN pain medication preferring oral medicines.  Follow up plan will be scheduled in approximately 7 days for incision check and XR.  Physical therapy to start immediately.  Implants: Tornier perform humeral size 3 stem, 0 retentive polyethylene, 42 standard glenosphere with a 29 full wedge baseplate, 45 center screw and 4 peripheral screws  Post-Op Diagnosis: Same Surgeons:Primary: Bjorn Pippin, MD Assistants:Caroline McBane PA-C Location: [facility] OR ROOM 6 Anesthesia: General with Exparel Interscalene Antibiotics: Ancef 2g preop, Vancomycin 1000mg  locally Tourniquet time: None Estimated Blood Loss: 100 Complications: None Specimens: None Implants: Implant Name Type Inv. Item Serial No. Manufacturer Lot No. LRB No. Used Action  BSPLAT GLND 15D 29 FULL WDG - [id number] Joint BSPLAT GLND 15D 29 FULL WDG [id number] [employer]  Right 1 Implanted  BASEPLATE GLENOID STD REV 42 - [id number] Joint BASEPLATE GLENOID STD REV 42 [id number] [employer]  Right 1 Implanted  SCREW CENTRAL THREAD 6.5X45 - [id number] Screw SCREW CENTRAL THREAD 6.5X45  [employer] ON STERILE TRAY Right 1 Implanted  SCREW 5.5X26 - [id number] Screw SCREW 5.5X26  [employer] ON STERILE TRAY Right 2 Implanted  SCREW 5.5X22 - [id number] Screw SCREW 5.5X22   [employer] ON STERILE TRAY Right 1 Implanted  SCREW PERIPHERAL 5.0X34 - [id number] Screw SCREW PERIPHERAL 5.0X34  [employer] ON STERILE TRAY Right 1 Implanted  STEM HUMERAL STD SHORT SZ3 - [id number] Joint STEM HUMERAL STD SHORT SZ3 [id number] [employer]  Right 1 Implanted  Tornier Perform Humeral system Retentive Reversed Insert, thickness +43mm. Size 3/4   [id number] [employer]  Right 1 Implanted    Indications for Surgery:   Mario [name] is a 82 y.o. male with end-stage cuff tear arthropathy.  Benefits and risks of operative and nonoperative management were discussed prior to surgery with patient/guardian(s) and informed consent form was completed.  Infection and need for further surgery were discussed as was prosthetic stability and cuff issues.  We additionally specifically discussed risks of axillary nerve injury, infection, periprosthetic fracture, continued pain and longevity of implants prior to beginning procedure.      Procedure:   The patient was identified in the preoperative holding area where the surgical site was marked. Block placed by anesthesia with exparel.  The patient was taken to the OR where a procedural timeout was called and the above noted anesthesia was induced.  The patient was positioned beachchair on allen table with spider arm positioner.  Preoperative antibiotics were dosed.  The patient's right shoulder was prepped and draped in the usual sterile fashion.  A second preoperative timeout was called.       Standard deltopectoral approach was performed with a #10 blade. We dissected down to the subcutaneous tissues and the cephalic vein was taken laterally with the deltoid. Clavipectoral fascia was incised in line with the incision. Deep retractors were placed. The long of  the biceps tendon was identified and there was significant tenosynovitis present.  Tenodesis was performed to the pectoralis tendon with #2 Ethibond. The remaining biceps was followed up into the  rotator interval where it was released.   The subscapularis was taken down in a full thickness layer with capsule along the humeral neck extending inferiorly around the humeral head. We continued releasing the capsule directly off of the osteophytes inferiorly all the way around the corner. This allowed [place] to dislocate the humeral head.   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 There was significant flattening of the humeral head.   The rotator cuff was carefully examined and noted to be irreperably torn.  The decision was confirmed that a reverse total shoulder was indicated for this patient.  There were osteophytes along the inferior humeral neck. The osteophytes were removed with an osteotome and a rongeur.  Osteophytes were removed with a rongeur and an osteotome and the anatomic neck was well visualized.     A humeral cutting guide was used extra medullary with a pin to help control version. The version was set at 20 of retroversion. Humeral osteotomy was performed with an oscillating saw. The head fragment was passed off the back table.  A cut protector plate was placed.  The subscapularis was again identified and immediately we took care to palpate the axillary nerve anteriorly and verify its position with gentle palpation as well as the tug test.  We then released the SGHL with bovie cautery prior to placing a curved mayo at the junction of the anterior glenoid well above the axillary nerve and bluntly dissecting the subscapularis from the capsule.  We then carefully protected the axillary nerve as we gently released the inferior capsule to fully mobilize the subscapularis.  An anterior deltoid retractor was then placed as well as a small Hohmann retractor superiorly.   The glenoid was inspected and had evidence of severe osteoarthritic wear with full-thickness cartilage loss and exposed subchondral bone.    The remaining labrum was removed circumferentially taking great care not to disrupt the posterior capsule.   At this point we felt based on blueprint templating that a full wedge augment was necessary.  We began by using a full wedge guide to place our center pin as was templated.  We had good position of this pin and we proceeded with our starter center drill.  This allowed for [place] to use the 15 degree full wedge reamer obtaining circumferential witness marks and good bone preparation for ingrowth.  At this point we proceeded with our center drill and had an intact vault.  We then drilled our center screw to a length of 45 mm.    We selected a 6.5 mm x 45 mm screw and the full wedge baseplate which was placed in the same orientation as our reaming.  We double checked that we had good apposition of the base plate to bone and then proceeded to place 3 locking screws and one nonlocking screw as is typical.   Next a 42 mm glenosphere was selected and impacted onto the baseplate. The center screw was tightened.  We turned attention back to the humeral side. The cut protector was removed.  We used the perform humeral sizing block to select the appropriate size which for this patient was a 3.  We then placed our center pin and reamed over it concentrically obtaining appropriate inset.  We then used  our lateralizing chisel to prepare the lateral aspect of the humerus.  At that point we selected the appropriate implant trialing a 3.  Using this trial implant we trialed multiple polyethylene sizes settling on a 0 which provided good stability and range of motion without excess soft tissue tension. The offset was dialed in to match the normal anatomy. The shoulder was trialed.  There was good ROM in all planes and the shoulder was stable with no inferior translation.  The real humeral implants were opened after again confirming sizes.  The trial was removed. #5 Fiberwire x4 sutures passed through the humeral neck  for subscap repair. The humeral component was press-fit obtaining a secure fit. The joint was reduced and thoroughly irrigated with pulsatile lavage. Subscap was repaired back with #5 Fiberwire sutures through bone tunnels. Hemostasis was obtained. The deltopectoral interval was reapproximated with #1 Ethibond. The subcutaneous tissues were closed with 2-0 Vicryl and the skin was closed with running monocryl.    The wounds were cleaned and dried and an Aquacel dressing was placed. The drapes taken down. The arm was placed into sling with abduction pillow. Patient was awakened, extubated, and transferred to the recovery room in stable condition. There were no intraoperative complications. The sponge, needle, and attention counts were  correct at the end of the case.     Sander Radon, PA-C, present and scrubbed throughout the case, critical for completion in a timely fashion, and for retraction, instrumentation, closure.

## 2022-12-05 NOTE — Plan of Care (Signed)
[facility] Tonsillectomy/Adenoidectomy, Postoperative PEDS care plan entered in error.

## 2022-12-05 NOTE — Anesthesia Procedure Notes (Signed)
Procedure Name: Intubation Date/Time: 12/05/2022 8:03 AM  Performed by: Thornell Mule, CRNAPre-anesthesia Checklist: Patient identified, Emergency Drugs available, Suction available and Patient being monitored Patient Re-evaluated:Patient Re-evaluated prior to induction Oxygen Delivery Method: Circle system utilized Preoxygenation: Pre-oxygenation with 100% oxygen Induction Type: IV induction Ventilation: Mask ventilation without difficulty Laryngoscope Size: Miller and 3 Grade View: Grade I Tube type: Oral Tube size: 7.5 mm Number of attempts: 1 Airway Equipment and Method: Stylet and Oral airway Placement Confirmation: ETT inserted through vocal cords under direct vision, positive ETCO2 and breath sounds checked- equal and bilateral Secured at: 22 cm Tube secured with: Tape Dental Injury: Teeth and Oropharynx as per pre-operative assessment

## 2022-12-05 NOTE — Anesthesia Postprocedure Evaluation (Signed)
Anesthesia Post Note  Patient: Mario [name]  Procedure(s) Performed: REVERSE SHOULDER ARTHROPLASTY (Right: Shoulder)     Patient location during evaluation: PACU Anesthesia Type: General Level of consciousness: awake Pain management: pain level controlled Vital Signs Assessment: post-procedure vital signs reviewed and stable Respiratory status: spontaneous breathing, nonlabored ventilation and respiratory function stable Cardiovascular status: blood pressure returned to baseline and stable Postop Assessment: no apparent nausea or vomiting Anesthetic complications: no   No notable events documented.  Last Vitals:  Vitals:   12/05/22 1015 12/05/22 1041  BP: 111/67 119/64  Pulse: 69 78  Resp: 16 14  Temp:  (!) 36.3 C  SpO2: 93% 92%    Last Pain:  Vitals:   12/05/22 1041  TempSrc:   PainSc: 0-No pain                 Linton Rump

## 2022-12-05 NOTE — Progress Notes (Signed)
Assisted Dr. Neoma Laming with right, interscalene , ultrasound guided block. Side rails up, monitors on throughout procedure. See vital signs in flow sheet. Tolerated Procedure well.

## 2022-12-05 NOTE — Anesthesia Procedure Notes (Signed)
Anesthesia Regional Block: Interscalene brachial plexus block   Pre-Anesthetic Checklist: , timeout performed,  Correct Patient, Correct Site, Correct Laterality,  Correct Procedure, Correct Position, site marked,  Risks and benefits discussed,  Surgical consent,  Pre-op evaluation,  At surgeon's request and post-op pain management  Laterality: Right  Prep: chloraprep       Needles:  Injection technique: Single-shot  Needle Type: Echogenic Stimulator Needle     Needle Length: 9cm  Needle Gauge: 21     Additional Needles:   Procedures:,,,, ultrasound used (permanent image in chart),,    Narrative:  Start time: 12/05/2022 7:04 AM End time: 12/05/2022 7:07 AM Injection made incrementally with aspirations every 5 mL.  Performed by: Personally  Anesthesiologist: Linton Rump, MD  Additional Notes: Discussed risks and benefits of nerve block including, but not limited to, prolonged and/or permanent nerve injury involving sensory and/or motor function. Monitors were applied and a time-out was performed. The nerve and associated structures were visualized under ultrasound guidance. After negative aspiration, local anesthetic was slowly injected around the nerve. There was no evidence of high pressure during the procedure. There were no paresthesias. VSS remained stable and the patient tolerated the procedure well.

## 2022-12-05 NOTE — Transfer of Care (Signed)
Immediate Anesthesia Transfer of Care Note  Patient: Bryan Lade  Procedure(s) Performed: REVERSE SHOULDER ARTHROPLASTY (Right: Shoulder)  Patient Location: PACU  Anesthesia Type:GA combined with regional for post-op pain  Level of Consciousness: drowsy, patient cooperative, and responds to stimulation  Airway & Oxygen Therapy: Patient Spontanous Breathing and Patient connected to face mask oxygen  Post-op Assessment: Report given to RN and Post -op Vital signs reviewed and stable  Post vital signs: Reviewed and stable  Last Vitals:  Vitals Value Taken Time  BP    Temp    Pulse 61 12/05/22 0935  Resp 18 12/05/22 0935  SpO2 98 % 12/05/22 0935  Vitals shown include unfiled device data.  Last Pain:  Vitals:   12/05/22 0643  TempSrc: Oral  PainSc: 0-No pain      Patients Stated Pain Goal: 8 (12/05/22 8295)  Complications: No notable events documented.

## 2022-12-06 ENCOUNTER — Encounter (HOSPITAL_BASED_OUTPATIENT_CLINIC_OR_DEPARTMENT_OTHER): Payer: Self-pay | Admitting: Orthopaedic Surgery

## 2022-12-19 ENCOUNTER — Other Ambulatory Visit (HOSPITAL_COMMUNITY): Payer: Self-pay

## 2022-12-19 ENCOUNTER — Ambulatory Visit: Payer: Medicare Other | Attending: Cardiovascular Disease | Admitting: Cardiovascular Disease

## 2022-12-19 VITALS — BP 120/50 | HR 69 | Ht 72.0 in | Wt 200.0 lb

## 2022-12-19 DIAGNOSIS — I10 Essential (primary) hypertension: Secondary | ICD-10-CM

## 2022-12-19 DIAGNOSIS — I77810 Thoracic aortic ectasia: Secondary | ICD-10-CM

## 2022-12-19 DIAGNOSIS — I358 Other nonrheumatic aortic valve disorders: Secondary | ICD-10-CM

## 2022-12-19 DIAGNOSIS — G4733 Obstructive sleep apnea (adult) (pediatric): Secondary | ICD-10-CM | POA: Diagnosis not present

## 2022-12-19 DIAGNOSIS — I4819 Other persistent atrial fibrillation: Secondary | ICD-10-CM

## 2022-12-19 DIAGNOSIS — D6869 Other thrombophilia: Secondary | ICD-10-CM

## 2022-12-19 MED ORDER — HYDROCHLOROTHIAZIDE 12.5 MG PO CAPS
12.5000 mg | ORAL_CAPSULE | Freq: Every day | ORAL | 3 refills | Status: DC | PRN
  Filled 2022-12-19: qty 90, 90d supply, fill #0

## 2022-12-19 NOTE — Patient Instructions (Addendum)
Medication Instructions:  Hydrochlorothiazide 12.5 mg as needed for swelling *If you need a refill on your cardiac medications before your next appointment, please call your pharmacy*   Lab Work: None  If you have labs (blood work) drawn today and your tests are completely normal, you will receive your results only by: MyChart Message (if you have MyChart) OR A paper copy in the mail If you have any lab test that is abnormal or we need to change your treatment, we will call you to review the results.   Testing/Procedures: Echo Your physician has requested that you have an echocardiogram. Echocardiography is a painless test that uses sound waves to create images of your heart. It provides your doctor with information about the size and shape of your heart and how well your heart's chambers and valves are working. This procedure takes approximately one hour. There are no restrictions for this procedure. Please do NOT wear cologne, perfume, aftershave, or lotions (deodorant is allowed). Please arrive 15 minutes prior to your appointment time.  Please note: We ask at that you not bring children with you during ultrasound (echo/ vascular) testing. Due to room size and safety concerns, children are not allowed in the ultrasound rooms during exams. Our front office staff cannot provide observation of children in our lobby area while testing is being conducted. An adult accompanying a patient to their appointment will only be allowed in the ultrasound room at the discretion of the ultrasound technician under special circumstances. We apologize for any inconvenience.    Follow-Up: At [facility], you and your health needs are our priority.  As part of our continuing mission to provide you with exceptional heart care, we have created designated Provider Care Teams.  These Care Teams include your primary Cardiologist (physician) and Advanced Practice Providers (APPs -  Physician Assistants and  Nurse Practitioners) who all work together to provide you with the care you need, when you need it.  We recommend signing up for the patient portal called "MyChart".  Sign up information is provided on this After Visit Summary.  MyChart is used to connect with patients for Virtual Visits (Telemedicine).  Patients are able to view lab/test results, encounter notes, upcoming appointments, etc.  Non-urgent messages can be sent to your provider as well.   To learn more about what you can do with MyChart, go to [email or web address].    Your next appointment:   Dr. Nelly Laurence 12.4.24  Dr.Thomas Kelly in 6 months

## 2022-12-19 NOTE — Progress Notes (Signed)
Patient ID: Mario [name], male   DOB: 01-Aug-1940, 82 y.o.   MRN: [id number]      Primary M.D.: Dr. Burnard Leigh   HPI:  Mario [name] is a 82 y.o. male who presents for a 6 month follow-up cardiology evaluation.  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  He underwent endoscopy by Dr. Melvia Heaps and was noted to have an irregular heart rhythm with  sinus rhythm with PVCs and PACs when connected to the monitor.  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  He had undergone a cardiac catheterization in approximately 1985.  Last year  noticed chest tightness when working out and also had noticed significant increasing shortness of breath.  He has been aware of a somewhat irregular rhythm.  Over several months.  In July he was recently treated with diverticulitis with Cipro and Flagyl and had noticed increased palpitations at that time.  When I saw for initial evaluation on 09/27/2014 he was in atrial fibrillation which he was completely unaware of.  On further questioning, he omitted that he snores very loudly and has had significant episodes of witnessed apnea.  He can fall asleep during the day at any time if circumstances permit.  He has a history of hypertension.  Recently he had been taking atenolol 150 mg in the morning had taken testosterone injections once a week.  I changed his atenolol to 100 mg in the morning and 50 mg grams at night.  He was significantly hypertensive and I added amlodipine 5 mg to his medical regimen.  I scheduled him for a nuclear perfusion study which was done in 10/07/2014.  There were no ECG changes.  There was a small defect of mild severity in the basal inferior wall.  It was felt most likely this was diaphragmatic attenuation.  Ejection fraction was 59%.  An echo Doppler study revealed an EF of  50-55% and raise the possibility of mild posterior basal hypokinesis.  He had mild AR and mild MR.  He was started on eliquis at his office visit.  On follow-up evaluation, his blood pressure was still elevated.  I increased his amlodipine to 7.5 mg in the morning and also started him on losartan 50 mg daily.  He underwent a sleep study which confirmed my suspicion for obstructive sleep apnea.  Overall sleep apnea was mild with an AHI of 12.3 , but events were moderate with supine sleep with an AHI of 22.8 per hour. He has significant oxygen desaturation to a nadir of 81% and on the initial study had reduced sleep efficiency at 61.8% and evidence for loud snoring.  He subsequently underwent a CPAP titration trial and he was titrated up to 8 cm water pressure with excellent result.  He underwent successful cardioversion for his atrial fibrillation  On 12/01/2014. He admits to one brief episode following the cardioversion while sleeping before CPAP therapy was initiated and this had reverted back to normal rhythm in the morning when he awakened  CPAP was initiated on November 14, 2014. He has a ResMed Airfit P10 nasalpillow mask, medium size. A download was reviewed from 12/12/2014 through 01/10/2015. His compliance is excellent at 100% of usage.  He is averaging 8 hours and 24 minutes.  His AHI was still mildly elevated at 6.5,  but significant improved initially.  There is no significant leak.  He had undergone biopsy of his laryngeal nodule, which was negative.  When I saw him in January 2017, he complained of several days of being back in atrial fibrillation which seem to be precipitated after prolonged sneezing spell.  When I saw him, with his recurrent AF and normal LV function.  I recommended initiation of flecanide at 50 mg twice a day.  He was advised to reduce his atenolol from 75 g twice a day to 50 mg twice a day for 2 days and on the third day to reduce his atenolol to 25 g twice a day.  After 2  doses of flecanide he felt that his heart rhythm had again stabilized.  A CPAP download  from January 24, 2015 through 02/19/2015 demonstrated 100% compliance.  He averaged 9 hours of sleep per night.  He is set at an auto mode and his 95th percentile pressure was 13.7 with a maximum 16.9.  AHI was 7.9.  Of note, one week ago when I saw the patient.  We discussed turning off his ramp since at the beginning of his sleep.  He was having some difficulty getting adequate air. I rechecked a download for the past week when the has been off, AHI was slightly improved at 5.8/hr.  He feels well.  He has more energy.  He is exercising regularly.  He believes his sleep is good quality.  He denies residual daytime sleepiness.  In April 2017 he had been under increased mental stress and felt that  he went back into atrial fibrillation for approximately 12 hours, but then ultimately his rhythm stabilized.  When I saw him 6 weeks ago, he was in sinus rhythm.  At that time I increased his flecainide to 75 mg twice a day.  I also scheduled him for routine treadmill test to make certain he did not have any flecanide induced proarrhythmia.  This did not demonstrate any exercise-induced arrhythmia.  He was noted to have upsloping ST segment depression of approximately 1 mm with rapid resolution in less than 1 minute in early recovery.  Previously, his nuclear perfusion study did not show any ischemia.  He feels that his mental stress has improved since his daughter seems to be reconciling with her husband and is no longer planning to go through a divorce.  He denies any chest pain or shortness of breath.    When I saw him in October 2018.  He was unaware of any recurrent episodes of atrial fibrillation.  However, at times he had experienced an occasional skipped beat.   He was cutting wood and accidentally cut off the distal aspect of his fourth finger of his left hand several months ago.  He also had been taking testosterone  prescribed by his urologist, but recently his hemoglobin had increased and he reduced his testosterone dose in half.  He has continued to be on flecainide 75 mg twice a day in addition to eliquis 5 mg twice a day for anticoagulation.  He also has been on amlodipine 7.5 mg, and losartan 100 mg in addition to atenolol 6.25 mg twice a day for hypertension.  He continues to use CPAP and cannot sleep without it.  In his words "a life changor."  His CPAP set up day was 03/04/2016.  A download was obtained in the office today from April 9 through 06/18/2017.  He is 100% compliant in sleeping 8 hours and 13 minutes.  He has a ResMed AirSense 10 AutoSet unit with a minimum pressure at 8 and maximum of 20.  95th percentile pressure was 12.6 with a maximum average pressure at 14.6.  AHI was 3.2 but there were days of significant leak.  He just recently got a new mask.  He admits to being under increased stress.  His dad is been in hospice.   His wife' dad unfortunately died on 10/06/2017 at age 12, 1 month prior to turning 1. This resulted in increased stress to Mario [name].  His blood pressure was noted to be somewhat labile during this.[name]  He is still working out at least 3 days/week lifting weights.  He continues to use CPAP with 100% compliance.  He states blood pressure typically has been in the 140s.  He needs to transient ankle edema particularly if he stands up for long duration while singing in the choir.  He is unaware of any breakthrough atrial fibrillation or rhythm disturbance.    I saw him in December 2019 at which time he was maintaining sinus rhythm, was 1% compliant with CPAP therapy.  QTc interval was stable.  On March 11, 2018 He developed an episode of lightheadedness and recurrent atrial fibrillation and was evaluated in the emergency room.  His heart rate was not fast and he was found to have up to 2-second pause.  Atenolol was discontinued and flecainide was further increased to 100 mg twice a  day.  He underwent cardioversion in the emergency room with restoration of sinus rhythm.  He was subsequently seen on March 13, 2018 by Brett Fairy, PA-C and was noted to have frequent PACs and PVCs.  He was started back on low-dose beta-blocker therapy with carvedilol 3.125 mg twice a day.  When seen several weeks later on March 31, 2018 heart rate was stable.  ECG showed sinus rhythm and QTc interval was normal at 393 ms.  However hydralazine was added to his regimen for mild blood pressure elevation.   Since he was last seen by Azalee Course, PAC, he has felt well.  He has continued to be very active working out at [employer] and now that the gym is closed due to the curve at 19 pandemic, he has been active building a deck at his house.  He lives outside [place] on a 12 acre plot of land.   I evaluated hum in a telemedicine vsist on 05/12/2018.  He was unaware of any recurrent atrial fibrillation.  He denies any anginal type chest pain symptoms.  He continues to use CPAP with 100% compliance.  When asked how his blood pressures have been running he states typically his blood pressure runs in the 1 20-1 30 range with diastolics in the 60s to 70s.  He underwent a repeat echo Doppler study on April 30, 2018.  This shows normal systolic function with an EF of 60 to 65%.  There is mild to moderate biatrial enlargement.  There is evidence for mild aortic stenosis with a mean gradient of 10 and peak gradient of 18.  He had mild aortic root dilatation at 41 mm.    When I saw him on December 27, 2020 Mario [name] has been back to the gym and  has been working out with significant weights. He had noticed increased heart rates after he had taken his first and second vaccination with PVCs. He denies any chest pain. He continues to be on amlodipine 10 mg, carvedilol 3.125 mg twice a  day, flecainide 100 mg twice a day, as well as losartan 100 mg daily. He is unaware of recurrent atrial fibrillation. He continues to use CPAP  with excellent compliance. I obtained a download in the office today from October 1 through December 27, 2019 with average CPAP use is 9 hours and 17 minutes. He has 100% compliant. His CPAP is set at a range of 8 to 20 cm and his AHI is 2.2. 95th percentile pressure is 12.4 with a maximum average pressure of 15.1.   He has had evaluations with Alphonzo Severance, PA and Dr. Johney Frame for atrial fibrillation after failing therapy with flecainide in January 2022.  He had been exercising heavily trying to get ready for a weightlifting competition.  He apparently underwent cardioversion in January 2022 and ablation by Dr. Johney Frame on March 02, 2020 for persistent atrial fibrillation at which time he was also found to have isthmus dependent right atrial flutter and multiple left atrial flutter circuits.  Subsequently he did develop a significant groin hematoma which ultimately stabilized.  I saw him on December 25, 2020 at which time he was unaware of any recurrent atrial fibrillation. He is now off flecainide and carvedilol but does admit to occasional palpitations.  He has lost approximately 20 pounds.  He is training for a benchpress competition.  He continues to use CPAP and a download today from October 13 through December 22, 2020 shows 100% compliance with average use at 8 hours and 56 minutes.  At his pressure range of 10 to 20 cm of water his 95th percentile pressure is 13.1 with maximum average pressure 15.0 and AHI 2.2/h.   He developed recurrent atrial flutter in September 2023 and flecainide was resumed.  He has been evaluated by Dr. Nelly Laurence and EP and in follow-up he remained in atypical flutter.  DC cardioversion was scheduled for November 06, 2021 but at the time he appeared to be in sinus rhythm with left bundle branch block.  He underwent atrial flutter ablation on January 17, 2021 which showed all 4 pulmonary veins were isolated.  His a flutter was atypical but he converted to atrial fibrillation and  complete mapping was not obtained.  According to Dr. Morrie Sheldon note on February 25, 2022 the flutter appears to be involving the roof and posterior wall and he was unable to deliver aggressive lesions on the posterior wall due to temperature rise.  I saw him on March 20, 2022 at which time he remained relatively asymptomatic.  There had been discussion by Dr. Nelly Laurence for potential initiation of Tikosyn and he is uncertain if he wants to proceed with this.  He still lifts weights and at age 21 won his competition in weight lifting bench presses.  He denies any angina.  Presently he is taking carvedilol 6.25 mg twice a day, Eliquis 5 mg twice a day, hydralazine 25 mg twice a day and irbesartan 75 mg daily.  He is on pantoprazole for GERD.  He continues to use CPAP therapy and is in need for new DME company since his prior company is no longer in the CPAP business.  I obtained a download from January 8 through January 17, 2023 which continues to show excellent compliance with 8 hours and 20 minutes of use.  At his pressure range of 10 to 20 cm of water, AHI is 3.2 with 95th percentile pressure 13.3 and maximum average pressure at 15.2.    At his last visit with me on Jun 18, 2022 he  continued to remain fairly stable.  He goes to the gym regularly.  His heart rate is well-controlled and he is unaware of any tachypalpitations.  He has issues with his right shoulder and left knee which have undergone injections.  He never pursued further evaluation with Dr. Nelly Laurence since he was significantly stressed with his wife's diagnosis of breast cancer and ongoing radiation treatment.  Presently, he is doing better.  He has recently been evaluated by Luberta Robertson, FNP for primary care last week on June 11, 2022..  Laboratory revealed hemoglobin A1c at 5.4.  CBC was stable.  Potassium 4.4.  Creatinine 1.20.  GFR 56.65 consistent with stage IIIa CKD.  Lipids were improved with total cholesterol 146, triglycerides 134, HDL 45, LDL  74.  He is sleeping well and continues to use CPAP.  I obtained a download from April 4 through Jun 14, 2022 which shows 100% use with average use at 8 hours and 22 minutes.  His ResMed AirSense 11 AutoSet unit is set at a pressure range of 10 to 20 cm.  AHI is 5.3.  His 95th percentile pressure is 13.3 with maximum average pressure 15.0.    Presently, Mario [name] continues to feel well.  He underwent right reverse shoulder replacement on December 05, 2022 by Dr. Ramond Marrow.  He tolerated that well.  He has a follow-up office visit scheduled with Dr. Nelly Laurence to further discuss the possibility of future Tikosyn load for his atrial fibrillation.  He denies chest pain or shortness of breath.  He continues to use CPAP with 100% compliance.  I obtained a download from October 8 through December 18, 2022.  Usage is 100%.  He has significant mask leak accounting for AHI elevated at 8.4.  His CPAP pressure ranges 12 to 20 cm.  His 95th percentile pressure is 14.5 with maximum average pressure 15.9.  He presents for evaluation.  Past Medical History:  Diagnosis Date   Anticoagulant long-term use    eliquis--- managed by cardiology   Arthritis    neck   Benign localized prostatic hyperplasia with lower urinary tract symptoms (LUTS)    Blister    06-25-2021  per pt has a small blister at end of tailbone from doing sit-up's, covers w/ band-aid   ED (erectile dysfunction)    First degree heart block    History of adenomatous polyp of colon    History of basal cell carcinoma (BCC) of skin    excision multiple area's   History of kidney stones    History of malignant melanoma of back 2000   mid back  s/p WLE 12/ 2011 stage 1, localized per pt and no recurrence   History of narrow angle glaucoma    per pt s/p laser both eyes no issues since approx 2006   History of squamous cell carcinoma of skin    excision multiple area's   Hypertension    Hypogonadism in male    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 Tresa Endo;   DCCV 02-22-2020 and 12-01-2014;  03-02-2020  s/p afib ablation by Dr Johney Frame   Seasonal allergies    Wears hearing aid in both ears  Past Surgical History:  Procedure Laterality Date   A-FLUTTER ABLATION N/A 01/17/2022   Procedure: A-FLUTTER ABLATION;  Surgeon: Mealor, Roberts Gaudy, MD;  Location: [facility] INVASIVE CV LAB;  Service: Cardiovascular;  Laterality: N/A;   ATRIAL FIBRILLATION ABLATION N/A 03/02/2020   Procedure: ATRIAL FIBRILLATION ABLATION;  Surgeon: Hillis Range, MD;  Location: [facility] INVASIVE CV LAB;  Service: Cardiovascular;  Laterality: N/A;   CARDIOVERSION N/A 12/01/2014   Procedure: CARDIOVERSION;  Surgeon: Lennette Bihari, MD;  Location: [facility] ENDOSCOPY;  Service: Cardiovascular;  Laterality: N/A;   CARDIOVERSION N/A 02/22/2020   Procedure: CARDIOVERSION;  Surgeon: Jodelle Red, MD;  Location: [facility] ENDOSCOPY;  Service: Cardiovascular;  Laterality: N/A;   CATARACT EXTRACTION W/ INTRAOCULAR LENS IMPLANT Bilateral    2000; 2004   COLONOSCOPY WITH ESOPHAGOGASTRODUODENOSCOPY (EGD)  09/2019   CYSTOSCOPY W/ URETEROSCOPY W/ LITHOTRIPSY  05/12/2003   @[facility]    ELBOW SURGERY Left 1984   tendon repair left arm   GOLD SEED IMPLANT N/A 06/27/2021   Procedure: GOLD SEED IMPLANT;  Surgeon: Crista Elliot, MD;  Location: [facility];  Service: Urology;  Laterality: N/A;   INCISION AND DRAINAGE OF WOUND Left 08/28/2016   Procedure: IRRIGATION AND DEBRIDEMENT WOUND;  Surgeon: Bradly Bienenstock, MD;  Location: [facility] OR;  Service: Orthopedics;  Laterality: Left;   LIPOMA EXCISION Right 04/18/2010   right thigh   MASS EXCISION Left 07/2001   excision left axila mass (benign)   MELANOMA EXCISION  01/2000   @ [facility];    WLE of mid back for melanoma and excision resection lipoma upper back   METACARPAL OSTEOTOMY Left 08/28/2016   Procedure: 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.;  Surgeon: Bradly Bienenstock, MD;  Location: [facility] OR;  Service: Orthopedics;  Laterality: Left;   MICROLARYNGOSCOPY WITH LASER N/A 01/25/2015   Procedure: MICROLARYNGOSCOPY ;  Surgeon: Christia Reading, MD;  [facility] OR;  Service: ENT;  Laterality: N/A;  Microlaryngoscopy with 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 with CO2 laser    REVERSE SHOULDER ARTHROPLASTY Right 12/05/2022   Procedure: REVERSE SHOULDER ARTHROPLASTY;  Surgeon: Bjorn Pippin, MD;  Location: [facility];  Service: Orthopedics;  Laterality: Right;   SATURATION BIOPSY OF PROSTATE  01/18/2005   @[facility]   w/ anesthesia   SPACE OAR INSTILLATION N/A 06/27/2021   Procedure: SPACE OAR INSTILLATION;  Surgeon: Crista Elliot, MD;  Location: [facility];  Service: Urology;  Laterality: N/A;   TENDON REPAIR Right 2012   right  involving elbow, arm. hand   TONSILLECTOMY  1970    Allergies  Allergen Reactions   Contrast Media [Iodinated Contrast Media] Shortness Of Breath, Rash and Other (See Comments)    First noted after Coronary CTA 02/24/20. Pt experienced wheezing, shortness of breath and diffuse rash/welts over face and trunk.   Bee Venom Other (See Comments)    Becomes light headed; faint. Has to take benadryl    Lisinopril Other (See Comments)    REACTION: severe GI upset requiring EGD--temporally related to lisinopril--tolerated micardis    Current Outpatient Medications  Medication Sig Dispense Refill   amLODipine (NORVASC) 10 MG tablet Take 1 tablet (10 mg total) by mouth daily. 90 tablet 1   apixaban (ELIQUIS) 5 MG TABS tablet Take 1 tablet by mouth twice daily 180 tablet 1   Carboxymethylcellulose Sodium (THERATEARS PF OP) Place 1 drop into both eyes daily as needed (dry eyes).     carvedilol (COREG) 3.125 MG tablet Take 2  tablets (  6.25 mg total) by mouth in the morning AND 1 tablet (3.125 mg total) every evening. 270 tablet 2   fluticasone (FLONASE) 50 MCG/ACT nasal spray Place 2 sprays into both nostrils at bedtime as needed for allergies.     GLUCOSAMINE-CHONDROITIN PO Take 1 tablet by mouth daily.      hydrALAZINE (APRESOLINE) 25 MG tablet TAKE 1 TABLET BY MOUTH TWICE DAILY APPT  REQUIRED  FOR  FURTHER  REFILLS 180 tablet 0   irbesartan (AVAPRO) 75 MG tablet Take 1 tablet (75 mg total) by mouth daily. 90 tablet 3   Lutein 20 MG CAPS Take 20 mg by mouth daily.     METAMUCIL FIBER PO Take 1 Scoop by mouth 3 (three) times a week.     Multiple Vitamin (MULTIVITAMIN) tablet Take 1 tablet by mouth daily.     pantoprazole (PROTONIX) 40 MG tablet Take 1 tablet (40 mg total) by mouth daily. 30 tablet 0   Saw Palmetto, Serenoa repens, (SAW PALMETTO PO) Take 2 tablets by mouth daily.     testosterone cypionate (DEPOTESTOSTERONE CYPIONATE) 200 MG/ML injection Inject 100 mg into the muscle every Wednesday.     No current facility-administered medications for this visit.    Social History   Socioeconomic History   Marital status: Married    Spouse name: Not on file   Number of children: 2   Years of education: Not on file   Highest education level: Not on file  Occupational History   Occupation: retired  Tobacco Use   Smoking status: Former    Current packs/day: 0.00    Types: Cigarettes    Start date: 11/20/1976    Quit date: 1986    Years since quitting: 38.9   Smokeless tobacco: Never  Vaping Use   Vaping status: Never Used  Substance and Sexual Activity   Alcohol use: Not Currently   Drug use: Never   Sexual activity: Not on file  Other Topics Concern   Not on file  Social History Narrative   Lives in [place].  Retired Theatre stage manager.  Owned a fitness center.  Weight lifter   Social Determinants of Health   Financial Resource Strain: Not on file  Food Insecurity: No Food Insecurity (10/08/2021)    Hunger Vital Sign    Worried About Running Out of Food in the Last Year: Never true    Ran Out of Food in the Last Year: Never true  Transportation Needs: No Transportation Needs (10/08/2021)   PRAPARE - Administrator, Civil Service (Medical): No    Lack of Transportation (Non-Medical): No  Physical Activity: Not on file  Stress: Not on file  Social Connections: Not on file  Intimate Partner Violence: Not on file   Additional social history is notable in that he is married for 43 years.  He has 2 children and 2 grandchildren.  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  He completed 12th grade of education.  Remotely he had smoked but quit in 1977.  He drinks 3 beers per day.  He has been exercising fairly regularly at least 4 days per week including weightlifting and cardio for~ 2 hours per session.  Family History  Problem Relation Age of Onset   Heart disease Mother    Stroke Father    Other Father        brain tumor-benign   Heart disease Sister    Heart disease Sister    Hypertension  Sister    Colon cancer Neg Hx    Stomach cancer Neg Hx    Esophageal cancer Neg Hx    Pancreatic cancer Neg Hx    Liver disease Neg Hx    Family history is notable that his mother died at age 22 with heart disease and had atrial fibrillation and congestive heart failure.  Father died at 48 with a brain tumor.  He has 2 sisters and both have hypertension.   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 due to a sawing accident; recent right  reverse shoulder replacement in October 2024 Hematologic/Oncologic: Negative; no easy bruising, bleeding Endocrine: Negative; no heat/cold intolerance; no diabetes Neuro: Negative; no changes in balance, headaches Skin: Negative; No rashes or skin lesions Psychiatric: Negative; No behavioral problems, depression Sleep: Positive for previously loud snoring, frequent awakenings, nonrestorative sleep, and daytime sleepiness, hypersomnolence, now on CPAP therapy with resolution of symptoms; no bruxism, restless legs, hypnogagnic hallucinations Other comprehensive 14 point system review is negative   Physical Exam BP (!) 120/50   Pulse 69   Ht 6' (1.829 m)   Wt 200 lb (90.7 kg)   SpO2 96%   BMI 27.12 kg/m    Repeat blood pressure by me 118/58  Wt Readings from Last 3 Encounters:  12/19/22 200 lb (90.7 kg)  12/05/22 195 lb 15.8 oz (88.9 kg)  09/23/22 199 lb 6.4 oz (90.4 kg)   General: Alert, oriented, no distress.  Skin: normal turgor, no rashes, warm and dry HEENT: Normocephalic, atraumatic. Pupils equal round and reactive to light; sclera anicteric; extraocular muscles intact;  Nose without nasal septal hypertrophy Mouth/Parynx benign; Mallinpatti scale 3 Neck: No JVD, no carotid bruits; normal carotid upstroke Lungs: clear to ausculatation and percussion; no wheezing or rales Chest wall: without tenderness to palpitation Heart: PMI not displaced, irregular irregular with controlled rate of atrial fibrillation, s1 s2 normal, 1/6 systolic murmur, no diastolic murmur, no rubs, gallops, thrills, or heaves Abdomen: soft, nontender; no hepatosplenomehaly, BS+; abdominal aorta nontender and not dilated by palpation. Back: no CVA tenderness Pulses 2+ Musculoskeletal: full range of motion, normal strength, no joint deformities Extremities: no clubbing cyanosis or edema, Homan's sign negative  Neurologic: grossly nonfocal; Cranial nerves grossly wnl Psychologic: Normal mood and affect     December 19, 2022 ECG (independently read by me): Atrial fibrillation at 69 bpm.  QTc interval 400 ms.   Jun 18, 2022 ECG (independently read by me): Atrial fibrillation at 60  March 20, 2022 ECG (independently read by me):  Atrial fibrillation at 58  December 25, 2020 ECG (independently read by me): NSR at 78, normal intervals  November 2021 ECG (independently read by me): NSR at 68;nonspecific  T changes, normal interval with QTc 414 msec  December 2019 ECG (independently read by me): Sinus bradycardia 55 bpm.  No ectopy.  Normal intervals.  May 2019 ECG (independently read by me): Bradycardia 56 bpm.  Nonspecific interventricular conduction delay.  QTc interval not 391 ms  October 2018 ECG (independently read by me): Sinus bradycardia 56 bpm.  No ectopy.  Normal intervals per  06/05/2016 ECG (independently read by me): Normal sinus  rhythm at 61 bpm.  Nonspecific T changes.  QTc interval 390 ms.  October 2017 ECG (independently read by me): Sinus bradycardia 57 bpm.  Nonspecific ST changes.  QTc interval 393 ms.  06/30/2015 ECG (independently read by me): Sinus bradycardia at 56 bpm.  No significant ST-T changes.  QTc interval 389 ms.  05/16/2015 ECG (independently read by me): Sinus bradycardia 59 bpm.  QTc interval 376 ms.  No significant ST changes.  02/23/2015 ECG (independently read by me): Sinus bradycardia 59 bpm.  QTc interval 390 ms.  Nonspecific T changes.  02/17/15 ECG (independently read by me): Atrial fibrillation at 58 bpm.  Nonspecific ST changes.  QTc interval 380 ms.  01/12/2015 ECG  (independently read by me):  Normal sinus rhythm at 62 bpm. Nonspecific ST-T changes.  September 2016 ECG (independently read by me): Atrial fibrillation with a slow ventricular response in the 50s.  QTc interval 364 ms.  Prior CG (independently read by me):  Atrial fibrillation with ventricular rate at 52 bpm.  No significant ST-T change.  09/27/2014 ECG (independently read by me):  Atrial fibrillation with ventricular rate at 59 bpm.  Nonspecific ST-T changes.  LABS:     Latest Ref Rng & Units 06/11/2022    2:39 PM 01/17/2022   12:39 PM 12/13/2021   10:07 AM  BMP  Glucose 70 - 99 mg/dL 82  85  93   BUN 6 - 23 mg/dL 25  15  17    Creatinine 0.40 - 1.50 mg/dL 2.13  0.86  5.78   BUN/Creat Ratio 10 - 24   13   Sodium 135 - 145 mEq/L 139  136  138   Potassium 3.5 - 5.1 mEq/L 4.4  4.5  4.5   Chloride 96 - 112 mEq/L 106  105  102   CO2 19 - 32 mEq/L 22  23  25    Calcium 8.4 - 10.5 mg/dL 9.4  9.1  9.3         Latest Ref Rng & Units 06/11/2022    2:39 PM 03/30/2021   12:56 PM 05/09/2020   11:37 AM  Hepatic Function  Total Protein 6.0 - 8.3 g/dL 6.7  7.1  6.9   Albumin 3.5 - 5.2 g/dL 4.1  4.0  4.2   AST 0 - 37 U/L 20  25  20    ALT 0 - 53 U/L 12  22  14    Alk Phosphatase 39 - 117 U/L 63  69  68   Total Bilirubin 0.2 - 1.2 mg/dL 0.6  0.6  0.6        Latest Ref Rng & Units 06/11/2022    2:39 PM 01/17/2022   12:39 PM 12/04/2021   11:07 AM  CBC  WBC 4.0 - 10.5 K/uL 7.5  5.0  5.7   Hemoglobin 13.0 - 17.0 g/dL 46.9  62.9  52.8   Hematocrit 39.0 - 52.0 % 44.3  42.3  45.2   Platelets 150.0 - 400.0 K/uL 202.0  171  201    Lab Results  Component Value Date   MCV 94.4 06/11/2022   MCV 92.6 01/17/2022   MCV 96 12/04/2021   Lab Results  Component Value Date   TSH 3.360 06/13/2017   Lab Results  Component Value Date   HGBA1C 5.4 06/11/2022     BNP    Component Value Date/Time   BNP 250.0 (H) 03/11/2018 1113    ProBNP No results found for: "PROBNP"   Lipid Panel  Component Value Date/Time   CHOL 146 06/11/2022 1439   CHOL 155 06/13/2017 0951   TRIG 134.0 06/11/2022 1439   HDL 45.40 06/11/2022 1439   HDL 45 06/13/2017 0951   CHOLHDL 3 06/11/2022 1439   VLDL 26.8 06/11/2022 1439   LDLCALC 74 06/11/2022 1439   LDLCALC 84 06/13/2017 0951    RADIOLOGY: ECHOCARDIOGRAM COMPLETE  Result Date: 12/20/2022    ECHOCARDIOGRAM REPORT   Patient Name:    Mario [name]   Date of Exam: 12/20/2022 Medical Rec #:  [id number]     Height:       72.0 in Accession #:    [id number]    Weight:       200.0 lb Date of Birth:  September 19, 1940     BSA:          2.131 m Patient Age:    82 years      BP:           120/50 mmHg Patient Gender: M             HR:           66 bpm. Exam Location:  [address] Procedure: 2D Echo, Cardiac Doppler and Color Doppler Indications:    I48.91 Atrial fibrillation  History:        Patient has prior history of Echocardiogram examinations, most                 recent 01/07/2022. Arrythmias:Atrial Fibrillation and PVC; Risk                 Factors:Hypertension, Former Smoker and Dilated ascending aorta.  Sonographer:    Samule Ohm RDCS Referring Phys: [id number] Alter Moss A Shaelin Lalley IMPRESSIONS  1. Left ventricular ejection fraction, by estimation, is 55 to 60%. The left ventricle has normal function. The left ventricle has no regional wall motion abnormalities. There is moderate asymmetric left ventricular hypertrophy of the basal and septal segments. Left ventricular diastolic parameters were normal.  2. Right ventricular systolic function is normal. The right ventricular size is normal.  3. Left atrial size was severely dilated.  4. Right atrial size was severely dilated.  5. The mitral valve is abnormal. Mild mitral valve regurgitation. No evidence of mitral stenosis.  6. The aortic valve is tricuspid. There is moderate calcification of the aortic valve. There is moderate thickening of the aortic valve. Aortic valve regurgitation is moderate. No aortic stenosis is present.  7. Aortic dilatation noted. There is moderate dilatation of the ascending aorta, measuring 40 mm.  8. The inferior vena cava is normal in size with greater than 50% respiratory variability, suggesting right atrial pressure of 3 mmHg. FINDINGS  Left Ventricle: Left ventricular ejection fraction, by estimation, is 55 to 60%. The left ventricle has normal function. The left ventricle has no  regional wall motion abnormalities. The left ventricular internal cavity size was normal in size. There is  moderate asymmetric left ventricular hypertrophy of the basal and septal segments. Left ventricular diastolic parameters were normal. Right Ventricle: The right ventricular size is normal. No increase in right ventricular wall thickness. Right ventricular systolic function is normal. Left Atrium: Left atrial size was severely dilated. Right Atrium: Right atrial size was severely dilated. Pericardium: Trivial pericardial effusion is present. The pericardial effusion is posterior to the left ventricle. Mitral Valve: The mitral valve is abnormal. There is mild thickening of the mitral valve leaflet(s). There is mild calcification of the mitral valve leaflet(s). Mild mitral valve regurgitation.  No evidence of mitral valve stenosis. Tricuspid Valve: The tricuspid valve is normal in structure. Tricuspid valve regurgitation is mild . No evidence of tricuspid stenosis. Aortic Valve: The aortic valve is tricuspid. There is moderate calcification of the aortic valve. There is moderate thickening of the aortic valve. Aortic valve regurgitation is moderate. Aortic regurgitation PHT measures 586 msec. No aortic stenosis is present. Pulmonic Valve: The pulmonic valve was normal in structure. Pulmonic valve regurgitation is mild. No evidence of pulmonic stenosis. Aorta: Aortic dilatation noted. There is moderate dilatation of the ascending aorta, measuring 40 mm. Venous: The inferior vena cava is normal in size with greater than 50% respiratory variability, suggesting right atrial pressure of 3 mmHg. IAS/Shunts: No atrial level shunt detected by color flow Doppler.  LEFT VENTRICLE PLAX 2D LVIDd:         4.80 cm   Diastology LVIDs:         3.10 cm   LV e' medial:    9.10 cm/s LV PW:         1.50 cm   LV E/e' medial:  11.2 LV IVS:        1.50 cm   LV e' lateral:   8.78 cm/s LVOT diam:     2.00 cm   LV E/e' lateral: 11.6 LV SV:          67 LV SV Index:   31 LVOT Area:     3.14 cm  RIGHT VENTRICLE            IVC RVSP:           34.6 mmHg  IVC diam: 1.60 cm LEFT ATRIUM            Index        RIGHT ATRIUM           Index LA diam:      5.40 cm  2.53 cm/m   RA Pressure: 3.00 mmHg LA Vol (A2C): 60.5 ml  28.40 ml/m  RA Area:     29.70 cm LA Vol (A4C): 123.0 ml 57.73 ml/m  RA Volume:   117.00 ml 54.92 ml/m  AORTIC VALVE LVOT Vmax:   107.00 cm/s LVOT Vmean:  68.740 cm/s LVOT VTI:    0.213 m AI PHT:      586 msec  AORTA Ao Root diam: 3.70 cm Ao Asc diam:  4.00 cm MITRAL VALVE                TRICUSPID VALVE MV Area (PHT): 5.18 cm     TR Peak grad:   31.6 mmHg MV Decel Time: 146 msec     TR Vmax:        281.00 cm/s MV E velocity: 102.13 cm/s  Estimated RAP:  3.00 mmHg                             RVSP:           34.6 mmHg                              SHUNTS                             Systemic VTI:  0.21 m  Systemic Diam: 2.00 cm Charlton Haws MD Electronically signed by Charlton Haws MD Signature Date/Time: 12/20/2022/4:53:30 PM    Final     IMPRESSION:  1. Essential hypertension   2. Persistent atrial fibrillation (HCC)   3. OSA on CPAP   4. Secondary hypercoagulable state (HCC)   5. Aortic valve sclerosis   6. Mild dilation of ascending aorta [facility])     ASSESSMENT AND PLAN: Mario [name] is an active young appearing 48 -year-old male who was found to be in atrial fibrillation in August 2016 and was started on anticoagulation.  He had undergone initial cardioversion in October 2016 and develop recurrent atrial fibrillation in January 2017 converted to sinus rhythm with institution of flecanide.  He had been maintaining sinus rhythm with good blood pressure control on amlodipine, carvedilol, hydralazine and losartan in addition to his flecainide.  Unfortunately, in January 2022 he began to fail flecainide and developed recurrent persistent atrial fibrillation.  He  underwent successful ablation by Dr.  Johney Frame on March 02, 2020 at which time he was also found to have in addition to persistent A. fib isthmus dependent right atrial flutter and multiple left atrial flutter circuits which were successfully ablated.  Subsequently,  flecainide and also carvedilol were discontinued. He subsequently developed typical atrial flutter and underwent attempted ablation by Dr. Nelly Laurence and during that procedure was not able to be fully mapped due to transitioning to atrial fibrillation.  When I saw him in follow-up in February 2024 he continued to be in atrial fibrillation with rate control and was essentially asymptomatic.  At that time he was under a fair amount of stress with a recent diagnosis of breast cancer with his wife who has now been undergoing radiation treatments.  Dr. Nelly Laurence had discussed the potential of Tikosyn therapy but Mario [name] was very concerned about potential reaction and in light of his wife's illness was not ready to consider that option.  When seen by me on Jun 18, 2022 he continued to be in atrial fibrillation with rate control at 60 bpm.  Blood pressure was stable.  He had been taking carvedilol 3.125 mg tablets 2 tablets twice a day and changed him to a 6.25 mg twice a day.  He continued to be on amlodipine 10 mg, hydralazine 25 mg twice a day, irbesartan 75 mg daily for blood pressure control.  He is anticoagulated on Eliquis 5 mg.  He was using CPAP with excellent compliance.  Subsequently, he underwent successful right reverse shoulder replacement by Dr. Celesta Gentile and had held anticoagulation for the procedure.  He continues to be in atrial fibrillation today.  We again discussed the potential consideration for Tikosyn load which will require several day hospitalization to make certain there is no significant QTc prolongation.  He continues to use CPAP with 100% compliance but does have mask leak.  He has been changed to a new DME company.  I will change his CPAP setting to a range of 14 to 20  cm.  We discussed his mask leak and possible need for new mask.  I am recommending he undergo a follow-up echo Doppler study.  At times he admits to rare ankle swelling.  I have suggested HCTZ 12.5 mg to take on an as needed basis.  He will be seeing Dr. Nelly Laurence for EP follow-up in December.  I will see him in 6 months for reevaluation or sooner as needed.   Lennette Bihari, MD, [facility] 12/28/2022 11:33 AM

## 2022-12-20 ENCOUNTER — Other Ambulatory Visit (HOSPITAL_COMMUNITY): Payer: Self-pay

## 2022-12-20 ENCOUNTER — Ambulatory Visit (HOSPITAL_COMMUNITY): Payer: Medicare Other | Attending: Cardiovascular Disease

## 2022-12-20 DIAGNOSIS — I10 Essential (primary) hypertension: Secondary | ICD-10-CM | POA: Diagnosis present

## 2022-12-20 DIAGNOSIS — I4819 Other persistent atrial fibrillation: Secondary | ICD-10-CM | POA: Diagnosis not present

## 2022-12-20 LAB — ECHOCARDIOGRAM COMPLETE
Area-P 1/2: 5.18 cm2
P 1/2 time: 586 ms
S' Lateral: 3.1 cm

## 2022-12-21 ENCOUNTER — Other Ambulatory Visit (HOSPITAL_COMMUNITY): Payer: Self-pay

## 2022-12-28 ENCOUNTER — Other Ambulatory Visit: Payer: Self-pay | Admitting: Cardiovascular Disease

## 2022-12-28 ENCOUNTER — Encounter: Payer: Self-pay | Admitting: Cardiovascular Disease

## 2023-01-15 ENCOUNTER — Encounter: Payer: Self-pay | Admitting: Cardiovascular Disease

## 2023-01-15 ENCOUNTER — Ambulatory Visit: Payer: Medicare Other | Attending: Cardiovascular Disease | Admitting: Cardiovascular Disease

## 2023-01-15 VITALS — BP 134/60 | HR 70 | Ht 72.0 in | Wt 199.0 lb

## 2023-01-15 DIAGNOSIS — I4819 Other persistent atrial fibrillation: Secondary | ICD-10-CM

## 2023-01-15 NOTE — Progress Notes (Signed)
Cardiology Office Note:    Date:  01/15/2023   ID:  Mario [name], DOB 06-Oct-1940, MRN [id number]  PCP:  Olive Bass, FNP   [facility] Providers Cardiologist:  Nicki Guadalajara, MD Electrophysiologist:  Maurice Small, MD     Referring MD: Olive Bass,*   Chief complaint: fatigue, shortness of breath  History of Present Illness:    Mario [name] is a 82 y.o. male with a hx of sleep apnea, hypertension, atrial fibrillation who presents for electrophysiology follow-up.  He was diagnosed with atrial fibrillation in 2016 and maintained on flecainide.  He has been on Eliquis for CHA2DS2-VASc score of 4.  He is a former patient of Dr. Johney Frame, who performed a fib and flutter ablation in January 2022 after he failed flecainide.  He was noted to have recurrence of atrial flutter in September 2023, so flecainide was resumed.  In follow-up later in the month, the patient remained in atypical flutter.  DC cardioversion was scheduled for 11/06/2021. He appeared to be in sinus rhythm with LBBB at the time.  He reports that he was very fatigued and shortness of breath while on flecainide.  He returned in atypical flutter with increased fatigue.  He underwent ablation for atypical atrial flutter in 2023.  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 There was an atypical flutter, but unfortunately he converted to atrial fibrillation, so we were not able to map completely.  From the work that we were able to do, the flutter appeared to be involving the roof and posterior wall.  I did ablate a roofline and inferior posterior wall line.  Unfortunately, due to temperature rises, I was unable to deliver aggressive lesions on the posterior wall.   He returned with recurrence of atrial flutter, which was not surprising given that the ablation was limited by esophageal heating.  I recommended Tikosyn load, but he was hesitant.  At the time, his wife had recently been  diagnosed with cancer, and he was understandably concerned about her treatment plan and prognosis.  Additionally, he read on the Internet and was under the impression that the mortality rate was 9% (it's closer to 0.5%).  He returns today September 23, 2022 feeling great.  He has been working out again.  His energy levels are good.  His wife has completed radiation therapy and has an excellent prognosis, he reports.  He is in an atypical flutter today.    EKGs/Labs/Other Studies Reviewed:      EKG:  Last EKG results: atypical flutter, V-rate 110 bpm   Recent Labs: 06/11/2022: ALT 12; BUN 25; Creatinine, Ser 1.20; Hemoglobin 15.5; Platelets 202.0; Potassium 4.4; Sodium 139     Physical Exam:    VS:  BP 134/60   Pulse 70   Ht 6' (1.829 m)   Wt 199 lb (90.3 kg)   SpO2 98%   BMI 26.99 kg/m     Wt Readings from Last 3 Encounters:  01/15/23 199 lb (90.3 kg)  12/19/22 200 lb (90.7 kg)  12/05/22 195 lb 15.8 oz (88.9 kg)     GEN: Well nourished, well developed in no acute distress CARDIAC: RRR, no murmurs, rubs, gallops RESPIRATORY:  Normal work of breathing MUSCULOSKELETAL: no edema    ASSESSMENT & PLAN:    Atypical atrial flutter:  s/p ablation by Dr. Johney Frame in 2022, and attempted roof and inferior posterior wall line by me in  01/17/22 that was limited by esophageal heating.  Felt very  fatigued on flecainide in the past He in in atypical flutter today with controlled rates, feeling great. We discussed management options including scheduling Tikosyn load, considering repeat ablation with pulse field ablation (which should not affect the esophagus), or continuing rate control. He would prefer a rate control approach at this time.  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  I think this is a reasonable approach. Continue apixaban 5mg   Atrial fibrillation:  PVI from previous ablation intact.  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  Continue eliquis   He is centering having a right shoulder surgery this fall.  He would like to follow-up with me again after his surgery.         Medication Adjustments/Labs and Tests Ordered: Current medicines are reviewed at length with the patient today.  Concerns regarding medicines are outlined above.  Orders Placed This Encounter  Procedures   EKG 12-Lead   No orders of the defined types were placed in this encounter.    Signed, Maurice Small, MD  01/15/2023 10:11 AM    [facility]

## 2023-01-15 NOTE — Patient Instructions (Signed)

## 2023-01-28 ENCOUNTER — Other Ambulatory Visit (HOSPITAL_COMMUNITY): Payer: Medicare Other

## 2023-02-22 ENCOUNTER — Other Ambulatory Visit: Payer: Self-pay | Admitting: Cardiovascular Disease

## 2023-02-22 DIAGNOSIS — I4891 Unspecified atrial fibrillation: Secondary | ICD-10-CM

## 2023-02-24 NOTE — Telephone Encounter (Signed)
 Prescription refill request for Eliquis received. Indication:afib Last office visit:12/24 Scr:1.20  4/24 Age: 83 Weight:90.3  kg  Prescription refilled

## 2023-05-10 ENCOUNTER — Other Ambulatory Visit: Payer: Self-pay | Admitting: Cardiovascular Disease

## 2023-06-19 ENCOUNTER — Other Ambulatory Visit: Payer: Self-pay | Admitting: Urology

## 2023-06-19 DIAGNOSIS — R310 Gross hematuria: Secondary | ICD-10-CM

## 2023-06-23 ENCOUNTER — Other Ambulatory Visit: Payer: Self-pay | Admitting: Cardiovascular Disease

## 2023-06-26 ENCOUNTER — Telehealth: Payer: Self-pay

## 2023-06-26 NOTE — Telephone Encounter (Signed)
 Copied from CRM [id number]. Topic: Referral - Request for Referral >> Jun 26, 2023  2:06 PM Allyne Areola wrote: Did the patient discuss referral with their provider in the last year? No (If No - schedule appointment) (If Yes - send message)  Appointment offered? Yes  Type of order/referral and detailed reason for visit: Referral to ENT. Patient was seen by hs dentist and he was told to schedule an appointment with an ENT due to sinus concerns.   Preference of office, provider, location: His previous ENT retired so he will see anyone Glade Lambert recommends  If referral order, have you been seen by this specialty before? Yes (If Yes, this issue or another issue? When? Where?  Can we respond through MyChart? No

## 2023-06-27 ENCOUNTER — Other Ambulatory Visit: Payer: Self-pay | Admitting: Family

## 2023-06-27 DIAGNOSIS — J329 Chronic sinusitis, unspecified: Secondary | ICD-10-CM

## 2023-06-27 NOTE — Telephone Encounter (Signed)
 Called pt and left a VM asking pt to give the office a call back.

## 2023-06-27 NOTE — Telephone Encounter (Signed)
 Spoke with pt, pt states he was seen by the dentist yesterday and had an Xray done, the xray showed pt L side sinuses is completely "blocked". Pt would like to see ENT to see what his next steps are.

## 2023-07-24 ENCOUNTER — Telehealth: Payer: Self-pay

## 2023-07-24 NOTE — Telephone Encounter (Signed)
..     Pre-operative Risk Assessment    Patient Name: Mario [name]  DOB: Sep 27, 1940 MRN: [id number]   Date of last office visit: 01/15/23 Date of next office visit: 07/29/23   Request for Surgical Clearance    Procedure:  LEFT PARTIAL KNEE ARTHROPLASTY- MEDIAL  Date of Surgery:  Clearance TBD                                Surgeon:  DR Priscille Brought Surgeon's Group or Practice Name:  [facility] Phone number:  [phone] Fax number:  [phone]   Type of Clearance Requested:   - Medical  - Pharmacy:  Hold Apixaban  (Eliquis )     Type of Anesthesia:  Spinal   Additional requests/questions:    Montel Antu   07/24/2023, 2:28 PM

## 2023-07-25 ENCOUNTER — Telehealth: Payer: Self-pay | Admitting: Family

## 2023-07-25 NOTE — Telephone Encounter (Signed)
 He will need to be seen for surgical clearance for knee surgery; last appointment here was 05/2022; we will just be doing the medical part. His cardiologist will also need to do a clearance.

## 2023-07-25 NOTE — Telephone Encounter (Signed)
 Spoke with pt, pt is aware and expressed understanding. Scheduled pt an appointment 07/31/2023.

## 2023-07-29 ENCOUNTER — Ambulatory Visit: Attending: Cardiovascular Disease | Admitting: Cardiovascular Disease

## 2023-07-29 ENCOUNTER — Encounter: Payer: Self-pay | Admitting: Cardiovascular Disease

## 2023-07-29 DIAGNOSIS — I4819 Other persistent atrial fibrillation: Secondary | ICD-10-CM

## 2023-07-29 DIAGNOSIS — Z01810 Encounter for preprocedural cardiovascular examination: Secondary | ICD-10-CM

## 2023-07-29 DIAGNOSIS — G4733 Obstructive sleep apnea (adult) (pediatric): Secondary | ICD-10-CM

## 2023-07-29 DIAGNOSIS — I484 Atypical atrial flutter: Secondary | ICD-10-CM

## 2023-07-29 DIAGNOSIS — I10 Essential (primary) hypertension: Secondary | ICD-10-CM

## 2023-07-29 DIAGNOSIS — Z7901 Long term (current) use of anticoagulants: Secondary | ICD-10-CM

## 2023-07-29 DIAGNOSIS — I77810 Thoracic aortic ectasia: Secondary | ICD-10-CM

## 2023-07-29 DIAGNOSIS — I358 Other nonrheumatic aortic valve disorders: Secondary | ICD-10-CM

## 2023-07-29 NOTE — Progress Notes (Signed)
 Patient ID: Zackory Pudlo, male   DOB: [birth year], 83 y.o.   MRN: [id number]      Primary M.D.: Dr. Luevenia Saha   HPI:  Livan Hires is a 83 y.o. male who presents for a 7 month follow-up cardiology/sleep evaluation.  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  He underwent endoscopy by Dr. Blenda Burdock and was noted to have an irregular heart rhythm with  sinus rhythm with PVCs and PACs when connected to the monitor.  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  He had undergone a cardiac catheterization in approximately 1985.  Last year  noticed chest tightness when working out and also had noticed significant increasing shortness of breath.  He has been aware of a somewhat irregular rhythm.  Over several months.  In July he was recently treated with diverticulitis with Cipro  and Flagyl  and had noticed increased palpitations at that time.  When I saw for initial evaluation on 09/27/2014 he was in atrial fibrillation which he was completely unaware of.  On further questioning, he omitted that he snores very loudly and has had significant episodes of witnessed apnea.  He can fall asleep during the day at any time if circumstances permit.  He has a history of hypertension.  Recently he had been taking atenolol  150 mg in the morning had taken testosterone  injections once a week.  I changed his atenolol  to 100 mg in the morning and 50 mg grams at night.  He was significantly hypertensive and I added amlodipine  5 mg to his medical regimen.  I scheduled him for a nuclear perfusion study which was done in 10/07/2014.  There were no ECG changes.  There was a small defect of mild severity in the basal inferior wall.  It was felt most likely this was diaphragmatic attenuation.  Ejection fraction was 59%.  An echo Doppler study revealed an  EF of 50-55% and raise the possibility of mild posterior basal hypokinesis.  He had mild AR and mild MR.  He was started on eliquis  at his office visit.  On follow-up evaluation, his blood pressure was still elevated.  I increased his amlodipine  to 7.5 mg in the morning and also started him on losartan  50 mg daily.  He underwent a sleep study which confirmed my suspicion for obstructive sleep apnea.  Overall sleep apnea was mild with an AHI of 12.3 , but events were moderate with supine sleep with an AHI of 22.8 per hour. He has significant oxygen  desaturation to a nadir of 81% and on the initial study had reduced sleep efficiency at 61.8% and evidence for loud snoring.  He subsequently underwent a CPAP titration trial and he was titrated up to 8 cm water pressure with excellent result.  He underwent successful cardioversion for his atrial fibrillation  On 12/01/2014. He admits to one brief episode following the cardioversion while sleeping before CPAP therapy was initiated and this had reverted back to normal rhythm in the morning when he awakened  CPAP was initiated on November 14, 2014. He has a ResMed Airfit P10 nasalpillow mask, medium size. A download was reviewed from 12/12/2014 through 01/10/2015. His compliance is excellent at 100% of usage.  He is averaging 8 hours and 24 minutes.  His AHI was still mildly elevated at 6.5,  but significant improved initially.  There is no significant leak.  He had undergone biopsy of his laryngeal nodule, which was negative.  When I saw him in January 2017, he complained of several days of being back in atrial fibrillation which seem to be precipitated after prolonged sneezing spell.  When I saw him, with his recurrent AF and normal LV function.  I recommended initiation of flecanide at 50 mg twice a day.  He was advised to reduce his atenolol  from 75 g twice a day to 50 mg twice a day for 2 days and on the third day to reduce his atenolol  to 25 g twice a day.   After 2 doses of flecanide he felt that his heart rhythm had again stabilized.  A CPAP download  from January 24, 2015 through 02/19/2015 demonstrated 100% compliance.  He averaged 9 hours of sleep per night.  He is set at an auto mode and his 95th percentile pressure was 13.7 with a maximum 16.9.  AHI was 7.9.  Of note, one week ago when I saw the patient.  We discussed turning off his ramp since at the beginning of his sleep.  He was having some difficulty getting adequate air. I rechecked a download for the past week when the has been off, AHI was slightly improved at 5.8/hr.  He feels well.  He has more energy.  He is exercising regularly.  He believes his sleep is good quality.  He denies residual daytime sleepiness.  In April 2017 he had been under increased mental stress and felt that  he went back into atrial fibrillation for approximately 12 hours, but then ultimately his rhythm stabilized.  When I saw him 6 weeks ago, he was in sinus rhythm.  At that time I increased his flecainide  to 75 mg twice a day.  I also scheduled him for routine treadmill test to make certain he did not have any flecanide induced proarrhythmia.  This did not demonstrate any exercise-induced arrhythmia.  He was noted to have upsloping ST segment depression of approximately 1 mm with rapid resolution in less than 1 minute in early recovery.  Previously, his nuclear perfusion study did not show any ischemia.  He feels that his mental stress has improved since his daughter seems to be reconciling with her husband and is no longer planning to go through a divorce.  He denies any chest pain or shortness of breath.    When I saw him in October 2018.  He was unaware of any recurrent episodes of atrial fibrillation.  However, at times he had experienced an occasional skipped beat.   He was cutting wood and accidentally cut off the distal aspect of his fourth finger of his left hand several months ago.  He also had been taking  testosterone  prescribed by his urologist, but recently his hemoglobin had increased and he reduced his testosterone  dose in half.  He has continued to be on flecainide  75 mg twice a day in addition to eliquis  5 mg twice a day for anticoagulation.  He also has been on amlodipine  7.5 mg, and losartan  100 mg in addition to atenolol  6.25 mg twice a day for hypertension.  He continues to use CPAP and cannot sleep without it.  In his words a life changor.  His CPAP set up day was 03/04/2016.  A download was obtained in the office today from April 9 through 06/18/2017.  He is 100% compliant in sleeping 8 hours and 13 minutes.  He has a ResMed AirSense 10 AutoSet unit with a minimum pressure at 8 and maximum of 20.  95th percentile pressure was 12.6 with a maximum average pressure at 14.6.  AHI was 3.2 but there were days of significant leak.  He just recently got a new mask.  He admits to being under increased stress.  His dad is been in hospice.   His wife' dad unfortunately died on 2017-10-06 at age 11, 1 month prior to turning 53. This resulted in increased stress to Mr. Annette Barters.  His blood pressure was noted to be somewhat labile during this.Aaron Aas  He is still working out at least 3 days/week lifting weights.  He continues to use CPAP with 100% compliance.  He states blood pressure typically has been in the 140s.  He needs to transient ankle edema particularly if he stands up for long duration while singing in the choir.  He is unaware of any breakthrough atrial fibrillation or rhythm disturbance.    I saw him in December 2019 at which time he was maintaining sinus rhythm, was 1% compliant with CPAP therapy.  QTc interval was stable.  On March 11, 2018 He developed an episode of lightheadedness and recurrent atrial fibrillation and was evaluated in the emergency room.  His heart rate was not fast and he was found to have up to 2-second pause.  Atenolol  was discontinued and flecainide  was further increased to  100 mg twice a day.  He underwent cardioversion in the emergency room with restoration of sinus rhythm.  He was subsequently seen on March 13, 2018 by Lafonda Piety, PA-C and was noted to have frequent PACs and PVCs.  He was started back on low-dose beta-blocker therapy with carvedilol  3.125 mg twice a day.  When seen several weeks later on March 31, 2018 heart rate was stable.  ECG showed sinus rhythm and QTc interval was normal at 393 ms.  However hydralazine  was added to his regimen for mild blood pressure elevation.   Since he was seen by Ervin Heath, PAC, he has felt well.  He has continued to be very active working out at [employer] and now that the gym is closed due to the curve at 19 pandemic, he has been active building a deck at his house.  He lives outside [place] on a 12 acre plot of land.   I evaluated hum in a telemedicine vsist on 05/12/2018.  He was unaware of any recurrent atrial fibrillation.  He denies any anginal type chest pain symptoms.  He continues to use CPAP with 100% compliance.  When asked how his blood pressures have been running he states typically his blood pressure runs in the 1 20-1 30 range with diastolics in the 60s to 70s.  He underwent a repeat echo Doppler study on April 30, 2018.  This shows normal systolic function with an EF of 60 to 65%.  There is mild to moderate biatrial enlargement.  There is evidence for mild aortic stenosis with a mean gradient of 10 and peak gradient of 18.  He had mild aortic root dilatation at 41 mm.    When I saw him on December 27, 2020 Mr. Annette Barters has been back to the gym and  has been working out with significant weights. He had noticed increased heart rates after he had taken his first and second vaccination with PVCs. He denies any chest pain. He continues to be on amlodipine  10 mg, carvedilol  3.125 mg twice a day,  flecainide  100 mg twice a day, as well as losartan  100 mg daily. He is unaware of recurrent atrial fibrillation. He continues to  use CPAP with excellent compliance. I obtained a download in the office today from October 1 through December 27, 2019 with average CPAP use is 9 hours and 17 minutes. He has 100% compliant. His CPAP is set at a range of 8 to 20 cm and his AHI is 2.2. 95th percentile pressure is 12.4 with a maximum average pressure of 15.1.   He has had evaluations with Valeda Garter, PA and Dr. Nunzio Belch for atrial fibrillation after failing therapy with flecainide  in January 2022.  He had been exercising heavily trying to get ready for a weightlifting competition.  He apparently underwent cardioversion in January 2022 and ablation by Dr. Nunzio Belch on March 02, 2020 for persistent atrial fibrillation at which time he was also found to have isthmus dependent right atrial flutter and multiple left atrial flutter circuits.  Subsequently he did develop a significant groin hematoma which ultimately stabilized.  I saw him on December 25, 2020 at which time he was unaware of any recurrent atrial fibrillation. He is now off flecainide  and carvedilol  but does admit to occasional palpitations.  He has lost approximately 20 pounds.  He is training for a benchpress competition.  He continues to use CPAP and a download today from October 13 through December 22, 2020 shows 100% compliance with average use at 8 hours and 56 minutes.  At his pressure range of 10 to 20 cm of water his 95th percentile pressure is 13.1 with maximum average pressure 15.0 and AHI 2.2/h.   He developed recurrent atrial flutter in September 2023 and flecainide  was resumed.  He has been evaluated by Dr. Arlester Ladd and EP and in follow-up he remained in atypical flutter.  DC cardioversion was scheduled for November 06, 2021 but at the time he appeared to be in sinus rhythm with left bundle branch block.  He underwent atrial flutter ablation on January 17, 2021 which showed all 4 pulmonary veins were isolated.  His a flutter was atypical but he converted to atrial  fibrillation and complete mapping was not obtained.  According to Dr. Katheryne Pane note on February 25, 2022 the flutter appears to be involving the roof and posterior wall and he was unable to deliver aggressive lesions on the posterior wall due to temperature rise.  I saw him on March 20, 2022 at which time he remained relatively asymptomatic.  There had been discussion by Dr. Arlester Ladd for potential initiation of Tikosyn and he is uncertain if he wants to proceed with this.  He still lifts weights and at age 53 won his competition in weight lifting bench presses.  He denies any angina.  Presently he is taking carvedilol  6.25 mg twice a day, Eliquis  5 mg twice a day, hydralazine  25 mg twice a day and irbesartan  75 mg daily.  He is on pantoprazole  for GERD.  He continues to use CPAP therapy and is in need for new DME company since his prior company is no longer in the CPAP business.  I obtained a download from January 8 through January 17, 2023 which continues to show excellent compliance with 8 hours and 20 minutes of use.  At his pressure range of 10 to 20 cm of water, AHI is 3.2 with 95th percentile pressure 13.3 and maximum average pressure at 15.2.    At his visit with me on Jun 18, 2022 he continued to  remain fairly stable.  He goes to the gym regularly.  His heart rate is well-controlled and he is unaware of any tachypalpitations.  He has issues with his right shoulder and left knee which have undergone injections.  He never pursued further evaluation with Dr. Arlester Ladd since he was significantly stressed with his wife's diagnosis of breast cancer and ongoing radiation treatment.  Presently, he is doing better.  He has recently been evaluated by Valma Gazella, FNP for primary care last week on June 11, 2022..  Laboratory revealed hemoglobin A1c at 5.4.  CBC was stable.  Potassium 4.4.  Creatinine 1.20.  GFR 56.65 consistent with stage IIIa CKD.  Lipids were improved with total cholesterol 146, triglycerides 134,  HDL 45, LDL 74.  He is sleeping well and continues to use CPAP.  I obtained a download from April 4 through Jun 14, 2022 which shows 100% use with average use at 8 hours and 22 minutes.  His ResMed AirSense 11 AutoSet unit is set at a pressure range of 10 to 20 cm.  AHI is 5.3.  His 95th percentile pressure is 13.3 with maximum average pressure 15.0.    I last saw him on December 19, 2022.  Mr. Annette Barters continues to feel well.  He underwent right reverse shoulder replacement on December 05, 2022 by Dr. Grafton Lawrence.  He tolerated that well.  He has a follow-up office visit scheduled with Dr. Arlester Ladd to further discuss the possibility of future Tikosyn load for his atrial fibrillation.  He denies chest pain or shortness of breath.  He continues to use CPAP with 100% compliance.  I obtained a download from October 8 through December 18, 2022.  Usage is 100%.  He has significant mask leak accounting for AHI elevated at 8.4.  His CPAP pressure ranges 12 to 20 cm.  His 95th percentile pressure is 14.5 with maximum average pressure 15.9.    Since I last saw him, Mr. Annette Barters tells me that he may need to undergo a left knee replacement surgery (on the right side of his left knee).  He has seen Dr. Oswald Bless at [facility].  He denies any chest pain or shortness of breath.  He still exercises.  He is on amlodipine  10 mg, carvedilol  6.25 mg twice a day, irbesartan  75 mg daily in addition to hydralazine  25 mg twice a day for blood pressure control.  He has permanent atrial fibrillation and is on Eliquis  for anticoagulation and denies bleeding.  He remotely had seen Dr. Mealor for EP assessment and there was some discussion about possible consideration of Tikosyn in the future.  He is scheduled to see him for follow-up later this year.  He continues to use CPAP with excellent compliance.  His most recent download from May 18 through July 28, 2023 shows 100% use with average use at 7 hours 26 minutes.  His CPAP is set at a pressure  range of 14 to 20 cm.  He does have large leak.  The patient states that he does not have mask leak but it is leaking inside his machine and it has been difficult for him to close the machine with the water humidification chamber.   He presents for evaluation.   Past Medical History:  Diagnosis Date   Anticoagulant long-term use    eliquis --- managed by cardiology   Arthritis    neck   Benign localized prostatic hyperplasia with lower urinary tract symptoms (LUTS)    Blister    06-25-2021  per pt has a small blister at end of tailbone from doing sit-up's, covers w/ band-aid   ED (erectile dysfunction)    First degree heart block    History of adenomatous polyp of colon    History of basal cell carcinoma (BCC) of skin    excision multiple area's   History of kidney stones    History of malignant melanoma of back 2000   mid back  s/p WLE 12/ 2011 stage 1, localized per pt and no recurrence   History of narrow angle glaucoma    per pt s/p laser both eyes no issues since approx 2006   History of squamous cell carcinoma of skin    excision multiple area's   Hypertension    Hypogonadism in male    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 Loetta Ringer;   DCCV 02-22-2020 and 12-01-2014;  03-02-2020  s/p afib ablation by Dr Nunzio Belch   Seasonal allergies    Wears hearing aid in both ears     Past Surgical History:  Procedure Laterality Date   A-FLUTTER ABLATION N/A 01/17/2022   Procedure: A-FLUTTER ABLATION;  Surgeon: Efraim Grange, MD;  Location: [facility] INVASIVE CV LAB;  Service: Cardiovascular;  Laterality: N/A;   ATRIAL FIBRILLATION ABLATION N/A 03/02/2020   Procedure: ATRIAL FIBRILLATION ABLATION;  Surgeon: Jolly Needle, MD;   Location: [facility] INVASIVE CV LAB;  Service: Cardiovascular;  Laterality: N/A;   CARDIOVERSION N/A 12/01/2014   Procedure: CARDIOVERSION;  Surgeon: Millicent Ally, MD;  Location: [facility] ENDOSCOPY;  Service: Cardiovascular;  Laterality: N/A;   CARDIOVERSION N/A 02/22/2020   Procedure: CARDIOVERSION;  Surgeon: Sheryle Donning, MD;  Location: [facility] ENDOSCOPY;  Service: Cardiovascular;  Laterality: N/A;   CATARACT EXTRACTION W/ INTRAOCULAR LENS IMPLANT Bilateral    2000; 2004   COLONOSCOPY WITH ESOPHAGOGASTRODUODENOSCOPY (EGD)  09/2019   CYSTOSCOPY W/ URETEROSCOPY W/ LITHOTRIPSY  05/12/2003   @[facility]    ELBOW SURGERY Left 1984   tendon repair left arm   GOLD SEED IMPLANT N/A 06/27/2021   Procedure: GOLD SEED IMPLANT;  Surgeon: Samson Croak, MD;  Location: [facility];  Service: Urology;  Laterality: N/A;   INCISION AND DRAINAGE OF WOUND Left 08/28/2016   Procedure: IRRIGATION AND DEBRIDEMENT WOUND;  Surgeon: Arvil Birks, MD;  Location: [facility] OR;  Service: Orthopedics;  Laterality: Left;   LIPOMA EXCISION Right 04/18/2010   right thigh   MASS EXCISION Left 07/2001   excision left axila mass (benign)   MELANOMA EXCISION  01/2000   @ [facility];   WLE of mid back for melanoma and excision resection lipoma upper back   METACARPAL OSTEOTOMY Left 08/28/2016   Procedure: 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.;  Surgeon: Arvil Birks, MD;  Location: [facility] OR;  Service: Orthopedics;  Laterality: Left;   MICROLARYNGOSCOPY WITH LASER N/A 01/25/2015   Procedure: MICROLARYNGOSCOPY ;  Surgeon: Virgina Grills, MD;  Location: [facility] OR;  Service: ENT;  Laterality: N/A;  Microlaryngoscopy with 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 with CO2 laser    REVERSE SHOULDER ARTHROPLASTY Right 12/05/2022   Procedure: REVERSE SHOULDER ARTHROPLASTY;  Surgeon: Micheline Ahr, MD;  Location: [facility];  Service: Orthopedics;  Laterality: Right;   SATURATION BIOPSY OF PROSTATE  01/18/2005   @[facility]   w/  anesthesia   SPACE OAR INSTILLATION N/A 06/27/2021   Procedure: SPACE OAR INSTILLATION;  Surgeon: Samson Croak, MD;  Location[facility];  Service: Urology;  Laterality: N/A;   TENDON REPAIR Right 2012   right  involving elbow, arm. hand   TONSILLECTOMY  1970    Allergies  Allergen Reactions   Contrast Media [Iodinated Contrast Media] Shortness Of Breath, Rash and Other (See Comments)    First noted after Coronary CTA 02/24/20. Pt experienced wheezing, shortness of breath and diffuse rash/welts over face and trunk.   Bee Venom Other (See Comments)    Becomes light headed; faint. Has to take benadryl     Lisinopril Other (See Comments)    REACTION: severe GI upset requiring EGD--temporally related to lisinopril--tolerated micardis    Current Outpatient Medications  Medication Sig Dispense Refill   amLODipine  (NORVASC ) 10 MG tablet Take 1 tablet (10 mg total) by mouth daily. 90 tablet 1   apixaban  (ELIQUIS ) 5 MG TABS tablet Take 1 tablet by mouth twice daily 180 tablet 1   Carboxymethylcellulose Sodium (THERATEARS PF OP) Place 1 drop into both eyes daily as needed (dry eyes).     carvedilol  (COREG ) 3.125 MG tablet Take 2 tablets (6.25 mg total) by mouth in the morning AND 1 tablet (3.125 mg total) every evening. 270 tablet 2   fluticasone  (FLONASE ) 50 MCG/ACT nasal spray Place 2 sprays into both nostrils at bedtime as needed for allergies.     GLUCOSAMINE-CHONDROITIN PO Take 1 tablet by mouth daily.      hydrALAZINE  (APRESOLINE ) 25 MG tablet TAKE 1 TABLET BY MOUTH TWICE DAILY APPT  REQUIRED  FOR  FURTHER  REFILLS 180 tablet 0   irbesartan  (AVAPRO ) 75 MG tablet Take 1 tablet (75 mg total) by mouth daily. 90 tablet 3   Lutein 20 MG CAPS Take 20 mg by mouth daily.     METAMUCIL FIBER PO Take 1 Scoop by mouth 3 (three) times a week.     Multiple Vitamin (MULTIVITAMIN) tablet Take 1 tablet by mouth daily.     pantoprazole  (PROTONIX ) 40 MG tablet Take 1 tablet (40 mg total)  by mouth daily. 30 tablet 0   Saw Palmetto, Serenoa repens, (SAW PALMETTO PO) Take 2 tablets by mouth daily.     testosterone  cypionate (DEPOTESTOSTERONE CYPIONATE) 200 MG/ML injection Inject 100 mg into the muscle every Wednesday.     No current facility-administered medications for this visit.    Social History   Socioeconomic History   Marital status: Married    Spouse name: Not on file   Number of children: 2   Years of education: Not on file   Highest education level: Not on file  Occupational History   Occupation: retired  Tobacco Use   Smoking status: Former    Current packs/day: 0.00    Types: Cigarettes    Start date: 11/20/1976    Quit date: 1986    Years since quitting: 39.4   Smokeless tobacco: Never  Vaping Use   Vaping status: Never Used  Substance and Sexual Activity   Alcohol use: Not Currently   Drug use: Never   Sexual activity: Not on file  Other Topics Concern   Not on file  Social History Narrative   Lives in [place].  Retired Theatre stage manager.  Owned a fitness center.  Weight lifter   Social  Drivers of Corporate investment banker Strain: Not on file  Food Insecurity: No Food Insecurity (10/08/2021)   Hunger Vital Sign    Worried About Running Out of Food in the Last Year: Never true    Ran Out of Food in the Last Year: Never true  Transportation Needs: No Transportation Needs (10/08/2021)   PRAPARE - Administrator, Civil Service (Medical): No    Lack of Transportation (Non-Medical): No  Physical Activity: Not on file  Stress: Not on file  Social Connections: Not on file  Intimate Partner Violence: Not on file   Additional social history is notable in that he is married for 43 years.  He has 2 children and 2 grandchildren.  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  He completed 12th grade of education.  Remotely he had smoked but quit in 1977.  He drinks 3 beers per day.  He has been exercising fairly  regularly at least 4 days per week including weightlifting and cardio for~ 2 hours per session.  Family History  Problem Relation Age of Onset   Heart disease Mother    Stroke Father    Other Father        brain tumor-benign   Heart disease Sister    Heart disease Sister    Hypertension Sister    Colon cancer Neg Hx    Stomach cancer Neg Hx    Esophageal cancer Neg Hx    Pancreatic cancer Neg Hx    Liver disease Neg Hx    Family history is notable that his mother died at age 57 with heart disease and had atrial fibrillation and congestive heart failure.  Father died at 70 with a brain tumor.  He has 2 sisters and both have hypertension.   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 due to a sawing accident; recent right reverse shoulder replacement in October 2024; need for left knee replacement Hematologic/Oncologic: Negative; no easy bruising, bleeding Endocrine: Negative; no heat/cold intolerance; no diabetes Neuro: Negative; no changes in balance, headaches Skin: Negative; No rashes or skin lesions Psychiatric: Negative; No behavioral problems, depression Sleep: Positive for previously loud snoring, frequent awakenings, nonrestorative sleep, and daytime sleepiness, hypersomnolence, now on CPAP therapy with resolution of symptoms; no bruxism, restless legs, hypnogagnic hallucinations Other comprehensive 14 point system review is negative   Physical Exam Pulse 64   Ht 5' 10.5 (1.791 m)   Wt 195 lb 6.4 oz (88.6 kg)   SpO2 97%   BMI 27.64 kg/m    Repeat  blood pressure by me 134/70  Wt Readings from Last 3 Encounters:  07/29/23 195 lb 6.4 oz (88.6 kg)  01/15/23 199 lb (90.3 kg)  12/19/22 200 lb (90.7 kg)   General: Alert, oriented, no distress.  Skin: normal turgor, no rashes, warm and dry HEENT: Normocephalic, atraumatic. Pupils equal round and reactive to light; sclera anicteric; extraocular muscles intact;  Nose without nasal septal hypertrophy Mouth/Parynx benign; Mallinpatti scale 3 Neck: No JVD, no carotid bruits; normal carotid upstroke Lungs: clear to ausculatation and percussion; no wheezing or rales Chest wall: without tenderness  to palpitation Heart: PMI not displaced, irregular irregular with controlled rate of atrial fibrillation, s1 s2 normal, 1/6 systolic murmur, no diastolic murmur, no rubs, gallops, thrills, or heaves Abdomen: soft, nontender; no hepatosplenomehaly, BS+; abdominal aorta nontender and not dilated by palpation. Back: no CVA tenderness Pulses 2+ Musculoskeletal: full range of motion, normal strength, no joint deformities Extremities: no clubbing cyanosis or edema, Homan's sign negative  Neurologic: grossly nonfocal; Cranial nerves grossly wnl Psychologic: Normal mood and affect   EKG Interpretation Date/Time:  Tuesday July 29 2023 10:55:07 EDT Ventricular Rate:  64 PR Interval:    QRS Duration:  100 QT Interval:  376 QTC Calculation: 387 R Axis:   34  Text Interpretation: Atrial fibrillation When compared with ECG of 15-Jan-2023 09:45, No significant change was found Confirmed by Magnus Schuller ([id number]) on 07/29/2023 11:29:50 AM     December 19, 2022 ECG (independently read by me): Atrial fibrillation at 69 bpm.  QTc interval 400 ms.   Jun 18, 2022 ECG (independently read by me): Atrial fibrillation at 60  March 20, 2022 ECG (independently read by me):  Atrial fibrillation at 58  December 25, 2020 ECG (independently read by me): NSR at 78, normal intervals  November 2021 ECG (independently read  by me): NSR at 68;nonspecific  T changes, normal interval with QTc 414 msec  December 2019 ECG (independently read by me): Sinus bradycardia 55 bpm.  No ectopy.  Normal intervals.  May 2019 ECG (independently read by me): Bradycardia 56 bpm.  Nonspecific interventricular conduction delay.  QTc interval not 391 ms  October 2018 ECG (independently read by me): Sinus bradycardia 56 bpm.  No ectopy.  Normal intervals per  06/05/2016 ECG (independently read by me): Normal sinus rhythm at 61 bpm.  Nonspecific T changes.  QTc interval 390 ms.  October 2017 ECG (independently read by me): Sinus bradycardia 57 bpm.  Nonspecific ST changes.  QTc interval 393 ms.  06/30/2015 ECG (independently read by me): Sinus bradycardia at 56 bpm.  No significant ST-T changes.  QTc interval 389 ms.  05/16/2015 ECG (independently read by me): Sinus bradycardia 59 bpm.  QTc interval 376 ms.  No significant ST changes.  02/23/2015 ECG (independently read by me): Sinus bradycardia 59 bpm.  QTc interval 390 ms.  Nonspecific T changes.  02/17/15 ECG (independently read by me): Atrial fibrillation at 58 bpm.  Nonspecific ST changes.  QTc interval 380 ms.  01/12/2015 ECG  (independently read by me):  Normal sinus rhythm at 62 bpm. Nonspecific ST-T changes.  September 2016 ECG (independently read by me): Atrial fibrillation with a slow ventricular response in the 50s.  QTc interval 364 ms.  Prior CG (independently read by me):  Atrial fibrillation with ventricular rate at 52 bpm.  No significant ST-T change.  09/27/2014 ECG (independently read by me): Atrial fibrillation with ventricular rate at 59 bpm.  Nonspecific ST-T changes.  LABS:     Latest Ref Rng & Units 06/11/2022    2:39 PM 01/17/2022   12:39 PM 12/13/2021   10:07 AM  BMP  Glucose 70 - 99 mg/dL 82  85  93   BUN 6 - 23 mg/dL 25  15  17    Creatinine 0.40 - 1.50 mg/dL 6.04  5.40  9.81   BUN/Creat Ratio 10 - 24   13   Sodium 135 - 145 mEq/L 139  136  138    Potassium 3.5 - 5.1 mEq/L 4.4  4.5  4.5   Chloride 96 -  112 mEq/L 106  105  102   CO2 19 - 32 mEq/L 22  23  25    Calcium 8.4 - 10.5 mg/dL 9.4  9.1  9.3         Latest Ref Rng & Units 06/11/2022    2:39 PM 03/30/2021   12:56 PM 05/09/2020   11:37 AM  Hepatic Function  Total Protein 6.0 - 8.3 g/dL 6.7  7.1  6.9   Albumin 3.5 - 5.2 g/dL 4.1  4.0  4.2   AST 0 - 37 U/L 20  25  20    ALT 0 - 53 U/L 12  22  14    Alk Phosphatase 39 - 117 U/L 63  69  68   Total Bilirubin 0.2 - 1.2 mg/dL 0.6  0.6  0.6        Latest Ref Rng & Units 06/11/2022    2:39 PM 01/17/2022   12:39 PM 12/04/2021   11:07 AM  CBC  WBC 4.0 - 10.5 K/uL 7.5  5.0  5.7   Hemoglobin 13.0 - 17.0 g/dL 91.4  78.2  95.6   Hematocrit 39.0 - 52.0 % 44.3  42.3  45.2   Platelets 150.0 - 400.0 K/uL 202.0  171  201    Lab Results  Component Value Date   MCV 94.4 06/11/2022   MCV 92.6 01/17/2022   MCV 96 12/04/2021   Lab Results  Component Value Date   TSH 3.360 06/13/2017   Lab Results  Component Value Date   HGBA1C 5.4 06/11/2022     BNP    Component Value Date/Time   BNP 250.0 (H) 03/11/2018 1113    ProBNP No results found for: PROBNP   Lipid Panel     Component Value Date/Time   CHOL 146 06/11/2022 1439   CHOL 155 06/13/2017 0951   TRIG 134.0 06/11/2022 1439   HDL 45.40 06/11/2022 1439   HDL 45 06/13/2017 0951   CHOLHDL 3 06/11/2022 1439   VLDL 26.8 06/11/2022 1439   LDLCALC 74 06/11/2022 1439   LDLCALC 84 06/13/2017 0951    RADIOLOGY: No results found.  IMPRESSION:  1. Preop cardiovascular exam   2. Essential hypertension   3. Persistent atrial fibrillation (HCC)   4. OSA on CPAP   5. Aortic valve sclerosis   6. Mild dilation of ascending aorta (HCC)   7. Atypical atrial flutter (HCC)   8. Anticoagulation adequate     ASSESSMENT AND PLAN: Mr. Jovanne Riggenbach is an active young appearing 61 -year-old male who was found to be in atrial fibrillation in August 2016 and was started on  anticoagulation.  He had undergone initial cardioversion in October 2016 and develop recurrent atrial fibrillation in January 2017 converted to sinus rhythm with institution of flecanide.  He had been maintaining sinus rhythm with good blood pressure control on amlodipine , carvedilol , hydralazine  and losartan  in addition to his flecainide .  Unfortunately, in January 2022 he began to fail flecainide  and developed recurrent persistent atrial fibrillation.  He  underwent successful ablation by Dr. Nunzio Belch on March 02, 2020 at which time he was also found to have in addition to persistent A. fib isthmus dependent right atrial flutter and multiple left atrial flutter circuits which were successfully ablated.  Subsequently,  flecainide  and also carvedilol  were discontinued. He subsequently developed typical atrial flutter and underwent attempted ablation by Dr. Arlester Ladd and during that procedure was not able to be fully mapped due to transitioning to atrial fibrillation.  When I saw  him in follow-up in February 2024 he continued to be in atrial fibrillation with rate control and was essentially asymptomatic.  At that time he was under a fair amount of stress with a recent diagnosis of breast cancer with his wife who has undergone radiation treatments.  Dr. Arlester Ladd had discussed the potential of Tikosyn therapy but Mr. Annette Barters was very concerned about potential reaction and in light of his wife's illness was not ready to consider that option.  Over the past year, Mr. Annette Barters has been in persistent atrial fibrillation, longstanding.  He is anticoagulated on Eliquis .  His blood pressure today is stable on amlodipine  10 mg, carvedilol  6.25 mg twice a day, hydralazine  25 mg twice a day in addition to irbesartan .  He is no longer taking HCTZ.  He tells me he is in need for left knee surgery (right side) and has been evaluated at [facility] with Dr. Doralee Gallop.  From a cardiac standpoint he is stable.  I recommended that he hold  Eliquis  for 3 days prior to the procedure and he will need to reinstitute Eliquis  when stable surgically.  He is planning to see Dr. Arlester Ladd for follow-up EP evaluation later this year.  Presently he feels well.  He denies chest pain or shortness of breath.  He still works out fairly regularly but does not lift as much heavy weight as he had in the past.  He continues to use CPAP and cannot sleep without it.  A download was obtained from May 18 through July 28, 2023.  Usage is 100% with average use at 7 hours 26 minutes.  He tells me he is having significant leak in his device rather than the mask.  This is contributing to AHI elevation at 5.3.  [employer] is his DME company and we will contact him to see if they can service his machine.  He is aware of my imminent retirement.  I will transition him to the care of Dr. Alvis Ba for follow-up cardiology evaluation and the care of Dr. Micael Adas for sleep follow-up.  Millicent Ally, MD, [facility] 07/29/2023 4:27 PM

## 2023-07-29 NOTE — Patient Instructions (Addendum)
 Medication Instructions:  Your physician recommends that you continue on your current medications as directed. Please refer to the Current Medication list given to you today.   Follow-Up: At [facility], you and your health needs are our priority.  As part of our continuing mission to provide you with exceptional heart care, our providers are all part of one team.  This team includes your primary Cardiologist (physician) and Advanced Practice Providers or APPs (Physician Assistants and Nurse Practitioners) who all work together to provide you with the care you need, when you need it.  Your next appointment:   6 month(s)  Provider:   Luana Rumple, MD then 1 year with Gaylyn Keas, MD (sleep)   We recommend signing up for the patient portal called MyChart.  Sign up information is provided on this After Visit Summary.  MyChart is used to connect with patients for Virtual Visits (Telemedicine).  Patients are able to view lab/test results, encounter notes, upcoming appointments, etc.  Non-urgent messages can be sent to your provider as well.   To learn more about what you can do with MyChart, go to [email or web address].   Other Instructions If you have any questions or concerns regarding your c-pap, bi-pap or sleep accessories, please contact Brandie Rorie at [phone].

## 2023-07-31 ENCOUNTER — Ambulatory Visit: Admitting: Family

## 2023-07-31 ENCOUNTER — Encounter: Payer: Self-pay | Admitting: Family

## 2023-07-31 VITALS — BP 124/80 | HR 71 | Ht 70.5 in | Wt 195.0 lb

## 2023-07-31 DIAGNOSIS — Z13220 Encounter for screening for lipoid disorders: Secondary | ICD-10-CM

## 2023-07-31 DIAGNOSIS — R7309 Other abnormal glucose: Secondary | ICD-10-CM

## 2023-07-31 DIAGNOSIS — I4891 Unspecified atrial fibrillation: Secondary | ICD-10-CM | POA: Diagnosis not present

## 2023-07-31 DIAGNOSIS — Z01818 Encounter for other preprocedural examination: Secondary | ICD-10-CM

## 2023-07-31 NOTE — Progress Notes (Signed)
 Mario [name] is a 83 y.o. 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     carvedilol  (COREG ) 6.25 MG tablet TAKE 1 TABLET BY MOUTH IN THE MORNING AND AT BEDTIME 180 tablet 0   fluticasone  (FLONASE ) 50 MCG/ACT nasal spray Place 2 sprays into both nostrils at bedtime as needed for allergies.     GLUCOSAMINE-CHONDROITIN PO Take 1 tablet by mouth daily.      hydrALAZINE  (APRESOLINE ) 25 MG tablet Take 1 tablet by mouth twice daily 180 tablet 3   irbesartan  (AVAPRO ) 75 MG tablet Take 1 tablet by mouth once daily 90 tablet 3   Lutein 20 MG CAPS Take 20 mg by mouth daily.     METAMUCIL FIBER PO Take 1 Scoop by mouth 3 (three) times a week.     Multiple Vitamin (MULTIVITAMIN)  tablet Take 1 tablet by mouth daily.     Saw Palmetto, Serenoa repens, (SAW PALMETTO PO) Take 2 tablets by mouth daily.     testosterone  cypionate (DEPOTESTOSTERONE CYPIONATE) 200 MG/ML injection Inject 100 mg into the muscle every Wednesday.     hydrochlorothiazide  (MICROZIDE ) 12.5 MG capsule Take 1 capsule (12.5 mg total) by mouth daily as needed. Take for swelling as needed (Patient not taking: Reported on 07/31/2023) 90 capsule 3   No current facility-administered medications for this visit.    Allergies: Contrast media [iodinated contrast media], Bee venom, and Lisinopril  Past Medical History:  Diagnosis Date   Anticoagulant long-term use    eliquis --- managed by cardiology   Arthritis    neck   Benign localized prostatic hyperplasia with lower urinary tract symptoms (LUTS)    Blister    06-25-2021  per pt has a small blister at end of tailbone from doing sit-up's, covers w/ band-aid   ED (erectile dysfunction)    First degree heart block    History of adenomatous polyp of colon    History of basal cell carcinoma (BCC) of skin    excision multiple area's   History of kidney stones    History of malignant melanoma of back 2000   mid back  s/p WLE 12/ 2011 stage 1, localized per pt and no recurrence   History of narrow angle glaucoma    per  pt s/p laser both eyes no issues since approx 2006   History of squamous cell carcinoma of skin    excision multiple area's   Hypertension    Hypogonadism in male    Malignant neoplasm prostate [facility]) 03/2021   urologist--- dr bell/  radiation oncology-- dr Lorri Rota;  dx 02/ 2041m  Gleason 3+4   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 (HCC)    per cardiac CT 02-24-2020   42mm   OSA on CPAP    per pt uses nightly   Persistent atrial fibrillation [facility])    cardiologist--- dr Elva Hamburger. Loetta Ringer;   DCCV 02-22-2020 and 12-01-2014;  03-02-2020  s/p afib ablation by Dr Nunzio Belch   Seasonal allergies    Wears hearing aid in both ears     Past Surgical History:   Procedure Laterality Date   A-FLUTTER ABLATION N/A 01/17/2022   Procedure: A-FLUTTER ABLATION;  Surgeon: Efraim Grange, MD;  Location: [facility] INVASIVE CV LAB;  Service: Cardiovascular;  Laterality: N/A;   ATRIAL FIBRILLATION ABLATION N/A 03/02/2020   Procedure: ATRIAL FIBRILLATION ABLATION;  Surgeon: Jolly Needle, MD;  Location: [facility] INVASIVE CV LAB;  Service: Cardiovascular;  Laterality: N/A;   CARDIOVERSION N/A 12/01/2014   Procedure: CARDIOVERSION;  Surgeon: Millicent Ally, MD;  Location: [facility] ENDOSCOPY;  Service: Cardiovascular;  Laterality: N/A;   CARDIOVERSION N/A 02/22/2020   Procedure: CARDIOVERSION;  Surgeon: Sheryle Donning, MD;  Location: [facility] ENDOSCOPY;  Service: Cardiovascular;  Laterality: N/A;   CATARACT EXTRACTION W/ INTRAOCULAR LENS IMPLANT Bilateral    2000; 2004   COLONOSCOPY WITH ESOPHAGOGASTRODUODENOSCOPY (EGD)  09/2019   CYSTOSCOPY W/ URETEROSCOPY W/ LITHOTRIPSY  05/12/2003   @[facility]    ELBOW SURGERY Left 1984   tendon repair left arm   GOLD SEED IMPLANT N/A 06/27/2021   Procedure: GOLD SEED IMPLANT;  Surgeon: Samson Croak, MD;  Location: [facility];  Service: Urology;  Laterality: N/A;   INCISION AND DRAINAGE OF WOUND Left 08/28/2016   Procedure: IRRIGATION AND DEBRIDEMENT WOUND;  Surgeon: Arvil Birks, MD;  Location: [facility] OR;  Service: Orthopedics;  Laterality: Left;   LIPOMA EXCISION Right 04/18/2010   right thigh   MASS EXCISION Left 07/2001   excision left axila mass (benign)   MELANOMA EXCISION  01/2000   @ [facility];   WLE of mid back for melanoma and excision resection lipoma upper back   METACARPAL OSTEOTOMY Left 08/28/2016   Procedure: 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.;  Surgeon: Arvil Birks, MD;  Location: [facility] OR;  Service: Orthopedics;  Laterality: Left;   MICROLARYNGOSCOPY WITH LASER N/A 01/25/2015   Procedure: MICROLARYNGOSCOPY ;  Surgeon: Virgina Grills, MD;  Location: [facility] OR;  Service: ENT;  Laterality: N/A;  Microlaryngoscopy  with 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 with CO2 laser    REVERSE SHOULDER ARTHROPLASTY Right 12/05/2022   Procedure: REVERSE SHOULDER ARTHROPLASTY;  Surgeon: Micheline Ahr, MD;  Location: [facility];  Service: Orthopedics;  Laterality: Right;   SATURATION BIOPSY OF PROSTATE  01/18/2005   @[facility]   w/ anesthesia   SPACE OAR INSTILLATION N/A 06/27/2021   Procedure: SPACE OAR INSTILLATION;  Surgeon: Samson Croak, MD;  [facility];  Service: Urology;  Laterality: N/A;   TENDON REPAIR Right 2012   right  involving elbow, arm. hand   TONSILLECTOMY  1970    Family History  Problem Relation Age of Onset   Heart disease Mother    Stroke Father    Other Father  brain tumor-benign   Heart disease Sister    Heart disease Sister    Hypertension Sister    Colon cancer Neg Hx    Stomach cancer Neg Hx    Esophageal cancer Neg Hx    Pancreatic cancer Neg Hx    Liver disease Neg Hx     Social History   Tobacco Use   Smoking status: Former    Current packs/day: 0.00    Types: Cigarettes    Start date: 11/20/1976    Quit date: 1986    Years since quitting: 39.4   Smokeless tobacco: Never  Substance Use Topics   Alcohol use: Not Currently    Subjective:   Will be having left partial knee arthroplasty; needs pre-op  clearance updated today; no acute concerns today; Continuing to see his cardiologist regularly- saw Dr. Loetta Ringer on Tuesday for cardiac clearance; also sees dermatology and urology regularly;  Does have persistent A. Fib- cardiology considered controlled. Notes that feeling best I have in a long time.    Objective:  Vitals:   07/31/23 1304  BP: 124/80  Pulse: 71  SpO2: 98%  Weight: 195 lb (88.5 kg)  Height: 5' 10.5 (1.791 m)    General: Well developed, well nourished, in no acute distress  Skin : Warm and dry.  Head: Normocephalic and atraumatic  Eyes: Sclera and conjunctiva clear; pupils round and reactive  to light; extraocular movements intact  Ears: External normal; canals clear; tympanic membranes normal  Oropharynx: Pink, supple. No suspicious lesions  Neck: Supple without thyromegaly, adenopathy  Lungs: Respirations unlabored; clear to auscultation bilaterally without wheeze, rales, rhonchi  CVS exam: normal rate, irregularly irregular rhythm with rate Neurologic: Alert and oriented; speech intact; face symmetrical; moves all extremities well; CNII-XII intact without focal deficit   Assessment:  1. Atrial fibrillation, unspecified type (HCC)   2. Pre-op  exam     Plan:  Check CBC, CMP, lipids, Hgba1c today; cleared medically with no concerns; congratulated patient on commitment to his health;  Follow up in 1 year, sooner prn.   No follow-ups on file.  No orders of the defined types were placed in this encounter.   Requested Prescriptions    No prescriptions requested or ordered in this encounter

## 2023-08-01 ENCOUNTER — Ambulatory Visit: Payer: Self-pay | Admitting: Family

## 2023-08-01 LAB — CBC WITH DIFFERENTIAL/PLATELET
Basophils Absolute: 0 10*3/uL (ref 0.0–0.1)
Basophils Relative: 0.8 % (ref 0.0–3.0)
Eosinophils Absolute: 0.1 10*3/uL (ref 0.0–0.7)
Eosinophils Relative: 1.9 % (ref 0.0–5.0)
HCT: 42 % (ref 39.0–52.0)
Hemoglobin: 14.3 g/dL (ref 13.0–17.0)
Lymphocytes Relative: 19 % (ref 12.0–46.0)
Lymphs Abs: 1.2 10*3/uL (ref 0.7–4.0)
MCHC: 34.1 g/dL (ref 30.0–36.0)
MCV: 94.7 fl (ref 78.0–100.0)
Monocytes Absolute: 1 10*3/uL (ref 0.1–1.0)
Monocytes Relative: 16 % — ABNORMAL HIGH (ref 3.0–12.0)
Neutro Abs: 3.8 10*3/uL (ref 1.4–7.7)
Neutrophils Relative %: 62.3 % (ref 43.0–77.0)
Platelets: 191 10*3/uL (ref 150.0–400.0)
RBC: 4.43 Mil/uL (ref 4.22–5.81)
RDW: 14.8 % (ref 11.5–15.5)
WBC: 6.1 10*3/uL (ref 4.0–10.5)

## 2023-08-01 LAB — COMPREHENSIVE METABOLIC PANEL WITH GFR
ALT: 14 U/L (ref 0–53)
AST: 20 U/L (ref 0–37)
Albumin: 4.3 g/dL (ref 3.5–5.2)
Alkaline Phosphatase: 69 U/L (ref 39–117)
BUN: 29 mg/dL — ABNORMAL HIGH (ref 6–23)
CO2: 25 meq/L (ref 19–32)
Calcium: 9.7 mg/dL (ref 8.4–10.5)
Chloride: 102 meq/L (ref 96–112)
Creatinine, Ser: 1 mg/dL (ref 0.40–1.50)
GFR: 69.94 mL/min (ref >60.00)
Glucose, Bld: 90 mg/dL (ref 70–99)
Potassium: 4.5 meq/L (ref 3.5–5.1)
Sodium: 136 meq/L (ref 135–145)
Total Bilirubin: 0.8 mg/dL (ref 0.2–1.2)
Total Protein: 6.8 g/dL (ref 6.0–8.3)

## 2023-08-01 LAB — LIPID PANEL
Cholesterol: 157 mg/dL (ref 0–200)
HDL: 40.7 mg/dL (ref >39.00)
LDL Cholesterol: 85 mg/dL (ref 0–99)
NonHDL: 116.49
Total CHOL/HDL Ratio: 4
Triglycerides: 155 mg/dL — ABNORMAL HIGH (ref 0.0–149.0)
VLDL: 31 mg/dL (ref 0.0–40.0)

## 2023-08-01 LAB — HEMOGLOBIN A1C: Hgb A1c MFr Bld: 5.4 % (ref 4.6–6.5)

## 2023-08-01 NOTE — Addendum Note (Signed)
 Addended by: Maceo Sax on: 08/01/2023 09:44 AM   Modules accepted: Orders

## 2023-08-01 NOTE — Progress Notes (Signed)
 Order for Mask fitting and device service sent to [facility]

## 2023-08-06 NOTE — Telephone Encounter (Signed)
 Patient with diagnosis of atrial fibrillation on Eliquis  for anticoagulation.    Procedure:  LEFT PARTIAL KNEE ARTHROPLASTY- MEDIAL   Date of Surgery:  Clearance TBD      CHA2DS2-VASc Score = 4   This indicates a 4.8% annual risk of stroke. The patient's score is based upon: CHF History: 0 HTN History: 1 Diabetes History: 0 Stroke History: 0 Vascular Disease History: 1 Age Score: 2 Gender Score: 0    CrCl 73  Platelet count 191     Patient has not had an Afib/aflutter ablation within the last 3 months or DCCV within the last 30 days  Per office protocol, patient can hold Eliquis  for 3 days prior to procedure.   Patient will not need bridging with Lovenox (enoxaparin) around procedure.  **This guidance is not considered finalized until pre-operative APP has relayed final recommendations.**

## 2023-08-07 NOTE — Telephone Encounter (Signed)
 I will forward to preop APP to review if pt has been cleared. Pt recently seen by Dr. Burnard

## 2023-08-07 NOTE — Telephone Encounter (Signed)
 Wife is following up requesting updates on clearance.

## 2023-08-08 NOTE — Telephone Encounter (Signed)
   Patient Name: Mario [name]  DOB: 04-06-1940 MRN: [id number]  Primary Cardiologist: Debby Sor, MD  Chart reviewed as part of pre-operative protocol coverage. Given past medical history and time since last visit, based on ACC/AHA guidelines, Mario [name] is at acceptable risk for the planned procedure without further cardiovascular testing.   Per Dr. Kelly:07/29/2023 He tells me he is in need for left knee surgery (right side) and has been evaluated at [facility] with Dr. Merlene.  From a cardiac standpoint he is stable.  I recommended that he hold Eliquis  for 3 days prior to the procedure and he will need to reinstitute Eliquis  when stable surgically.   Per Pharmacy 08/06/2023 CHA2DS2-VASc Score = 4   This indicates a 4.8% annual risk of stroke. The patient's score is based upon: CHF History: 0 HTN History: 1 Diabetes History: 0 Stroke History: 0 Vascular Disease History: 1 Age Score: 2 Gender Score: 0     CrCl 73             Platelet count 191                                  Patient has not had an Afib/aflutter ablation within the last 3 months or DCCV within the last 30 days   Per office protocol, patient can hold Eliquis  for 3 days prior to procedure.   Patient will not need bridging with Lovenox (enoxaparin) around procedure.   The patient was advised that if he develops new symptoms prior to surgery to contact our office to arrange for a follow-up visit, and he verbalized understanding.  I will route this recommendation to the requesting party via Epic fax function and remove from pre-op  pool.  Please call with questions.  Mario Satterfield, NP 08/08/2023, 10:23 AM

## 2023-08-25 ENCOUNTER — Other Ambulatory Visit: Payer: Self-pay | Admitting: Cardiovascular Disease

## 2023-08-25 DIAGNOSIS — I4891 Unspecified atrial fibrillation: Secondary | ICD-10-CM

## 2023-08-25 NOTE — Telephone Encounter (Signed)
 Prescription refill request for Eliquis  received. Indication:afib Last office visit:12/24 Scr:1.00  6/25 Age: 83 Weight:88.5  kg  Prescription refilled

## 2023-08-28 NOTE — Progress Notes (Signed)
 Sent message, via epic in basket, requesting orders in epic from Careers adviser.

## 2023-08-29 ENCOUNTER — Encounter (HOSPITAL_COMMUNITY): Payer: Self-pay | Admitting: *Deleted

## 2023-08-29 ENCOUNTER — Other Ambulatory Visit: Payer: Self-pay

## 2023-08-29 ENCOUNTER — Ambulatory Visit (HOSPITAL_COMMUNITY): Admission: EM | Admit: 2023-08-29 | Discharge: 2023-08-29 | Disposition: A | Discharge status: Home / Self Care

## 2023-08-29 ENCOUNTER — Ambulatory Visit (INDEPENDENT_AMBULATORY_CARE_PROVIDER_SITE_OTHER)

## 2023-08-29 DIAGNOSIS — R062 Wheezing: Secondary | ICD-10-CM

## 2023-08-29 DIAGNOSIS — J40 Bronchitis, not specified as acute or chronic: Secondary | ICD-10-CM

## 2023-08-29 MED ORDER — AZITHROMYCIN 250 MG PO TABS: ORAL_TABLET | ORAL | 0 refills | Status: DC

## 2023-08-29 MED ORDER — PROMETHAZINE-DM 6.25-15 MG/5ML PO SYRP: 5.0000 mL | ORAL_SOLUTION | Freq: Four times a day (QID) | ORAL | 0 refills | Status: DC | PRN

## 2023-08-29 MED ORDER — IPRATROPIUM-ALBUTEROL 0.5-2.5 (3) MG/3ML IN SOLN
RESPIRATORY_TRACT | Status: AC
Start: 2023-08-29 — End: 2023-08-29
  Filled 2023-08-29: qty 3

## 2023-08-29 MED ORDER — IPRATROPIUM-ALBUTEROL 0.5-2.5 (3) MG/3ML IN SOLN
3.0000 mL | Freq: Once | RESPIRATORY_TRACT | Status: AC
  Administered 2023-08-29: 3 mL via RESPIRATORY_TRACT

## 2023-08-29 MED ORDER — ALBUTEROL SULFATE HFA 108 (90 BASE) MCG/ACT IN AERS: 1.0000 | INHALATION_SPRAY | Freq: Four times a day (QID) | RESPIRATORY_TRACT | 0 refills | Status: DC | PRN

## 2023-08-29 MED ORDER — AZELASTINE HCL 0.1 % NA SOLN: 1.0000 | Freq: Two times a day (BID) | NASAL | 1 refills | Status: DC

## 2023-08-29 NOTE — ED Triage Notes (Addendum)
 PT cleaned out a carburetor last week and inhaled some of the fumes. Pt went to [facility] and was treated . Pt reports he is worse now. PT also reports ABD bloating and pain.

## 2023-08-29 NOTE — ED Provider Notes (Signed)
 [facility]  Note:  This document was prepared using Dragon voice recognition software and may include unintentional dictation errors.  MRN: [id number] DOB: 1940-08-31  Subjective:   Mario [name] is a 83 y.o. male presenting for shortness of breath, wheezing, cough, chest discomfort x 1 week with slightly worsening severity.  Patient reports that he was seen last week at [facility] urgent care center and was treated with prednisone  and albuterol inhaler which seemed to help slightly but now he believes that symptoms are worsening.  Denies fever, production with cough, weakness, body aches.  Patient also reports mild abdominal bloating and occasional pain.  Patient states that prior to the onset of symptoms he was cleaning his carburetor and believes he may have inhaled some fumes or vapor.  Patient is worried about pneumonia.  No current facility-administered medications for this encounter.  Current Outpatient Medications:    albuterol (VENTOLIN HFA) 108 (90 Base) MCG/ACT inhaler, Inhale 1-2 puffs into the lungs every 6 (six) hours as needed for wheezing or shortness of breath., Disp: 8.5 g, Rfl: 0   amLODipine  (NORVASC ) 10 MG tablet, Take 1 tablet by mouth once daily, Disp: 90 tablet, Rfl: 3   apixaban  (ELIQUIS ) 5 MG TABS tablet, Take 1 tablet by mouth twice daily, Disp: 180 tablet, Rfl: 1   azelastine (ASTELIN) 0.1 % nasal spray, Place 1 spray into both nostrils 2 (two) times daily. Use in each nostril as directed, Disp: 30 mL, Rfl: 1   azithromycin (ZITHROMAX Z-PAK) 250 MG tablet, Take 500 mg on day 1 followed by 250 mg for 4 days.  Take medication for a total of 5 days., Disp: 6 tablet, Rfl: 0   Carboxymethylcellulose Sodium (THERATEARS PF OP), Place 1 drop into both eyes daily as needed (dry eyes)., Disp: , Rfl:    carvedilol  (COREG ) 6.25 MG tablet, TAKE 1 TABLET BY MOUTH IN THE MORNING AND AT BEDTIME, Disp: 180 tablet, Rfl: 0   fluticasone  (FLONASE ) 50 MCG/ACT nasal spray,  Place 2 sprays into both nostrils at bedtime as needed for allergies., Disp: , Rfl:    GLUCOSAMINE-CHONDROITIN PO, Take 1 tablet by mouth daily. , Disp: , Rfl:    hydrALAZINE  (APRESOLINE ) 25 MG tablet, Take 1 tablet by mouth twice daily, Disp: 180 tablet, Rfl: 3   irbesartan  (AVAPRO ) 75 MG tablet, Take 1 tablet by mouth once daily, Disp: 90 tablet, Rfl: 3   Lutein 20 MG CAPS, Take 20 mg by mouth daily., Disp: , Rfl:    METAMUCIL FIBER PO, Take 1 Scoop by mouth 3 (three) times a week., Disp: , Rfl:    Multiple Vitamin (MULTIVITAMIN) tablet, Take 1 tablet by mouth daily., Disp: , Rfl:    testosterone  cypionate (DEPOTESTOSTERONE CYPIONATE) 200 MG/ML injection, Inject 100 mg into the muscle every Wednesday., Disp: , Rfl:    anastrozole (ARIMIDEX) 1 MG tablet, Take 1 mg by mouth 2 (two) times a week., Disp: , Rfl:    hydrochlorothiazide  (MICROZIDE ) 12.5 MG capsule, Take 1 capsule (12.5 mg total) by mouth daily as needed. Take for swelling as needed (Patient not taking: Reported on 07/31/2023), Disp: 90 capsule, Rfl: 3   promethazine-dextromethorphan (PROMETHAZINE-DM) 6.25-15 MG/5ML syrup, Take 5 mLs by mouth 4 (four) times daily as needed for cough., Disp: 118 mL, Rfl: 0   Saw Palmetto, Serenoa repens, (SAW PALMETTO PO), Take 2 tablets by mouth daily., Disp: , Rfl:    Allergies  Allergen Reactions   Contrast Media [Iodinated Contrast Media] Shortness Of Breath, Rash and  Other (See Comments)    First noted after Coronary CTA 02/24/20. Pt experienced wheezing, shortness of breath and diffuse rash/welts over face and trunk.   Bee Venom Other (See Comments)    Becomes light headed; faint. Has to take benadryl     Lisinopril Other (See Comments)    REACTION: severe GI upset requiring EGD--temporally related to lisinopril--tolerated micardis    Past Medical History:  Diagnosis Date   Anticoagulant long-term use    eliquis --- managed by cardiology   Arthritis    neck   Benign localized prostatic  hyperplasia with lower urinary tract symptoms (LUTS)    Blister    06-25-2021  per pt has a small blister at end of tailbone from doing sit-up's, covers w/ band-aid   ED (erectile dysfunction)    First degree heart block    History of adenomatous polyp of colon    History of basal cell carcinoma (BCC) of skin    excision multiple area's   History of kidney stones    History of malignant melanoma of back 2000   mid back  s/p WLE 12/ 2011 stage 1, localized per pt and no recurrence   History of narrow angle glaucoma    per pt s/p laser both eyes no issues since approx 2006   History of squamous cell carcinoma of skin    excision multiple area's   Hypertension    Hypogonadism in male    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 burnard;   DCCV 02-22-2020 and 12-01-2014;  03-02-2020  s/p afib ablation by Dr Kelsie   Seasonal allergies    Wears hearing aid in both ears      Past Surgical History:  Procedure Laterality Date   A-FLUTTER ABLATION N/A 01/17/2022   Procedure: A-FLUTTER ABLATION;  Surgeon: Nancey Eulas BRAVO, MD;  Location: [facility];  Service: Cardiovascular;  Laterality: N/A;   ATRIAL FIBRILLATION ABLATION N/A 03/02/2020   Procedure: ATRIAL FIBRILLATION ABLATION;  Surgeon: Kelsie Agent, MD;  Location: [facility];  Service: Cardiovascular;  Laterality: N/A;   CARDIOVERSION N/A 12/01/2014   Procedure: CARDIOVERSION;  Surgeon: Debby DELENA burnard, MD;  Location: [facility] END[facility]COPY;  Service: Cardiovascular;  Laterality: N/A;   CARDIOVERSION N/A 02/22/2020   Procedure: CARDIOVERSION;  Surgeon: Lonni Slain, MD;  Location: [facility] ENDOSCOPY;  Service: Cardiovascular;  Laterality: N/A;   CATARACT EXTRACTION W/  INTRAOCULAR LENS IMPLANT Bilateral    2000; 2004   COLONOSCOPY WITH ESOPHAGOGASTRODUODENOSCOPY (EGD)  09/2019   CYSTOSCOPY W/ URETEROSCOPY W/ LITHOTRIPSY  05/12/2003   @[facility]    ELBOW SURGERY Left 1984   tendon repair left arm   GOLD SEED IMPLANT N/A 06/27/2021   Procedure: GOLD SEED IMPLANT;  Surgeon: Carolee Sherwood JONETTA DOUGLAS, MD;  Location: [facility];  Service: Urology;  Laterality: N/A;   INCISION AND DRAINAGE OF WOUND Left 08/28/2016   Procedure: IRRIGATION AND DEBRIDEMENT WOUND;  Surgeon: Shari Easter, MD;  Location: [facility] OR;  Service: Orthopedics;  Laterality: Left;   LIPOMA EXCISION Right 04/18/2010   right thigh   MASS EXCISION Left 07/2001   excision left axila mass (benign)   MELANOMA EXCISION  01/2000   @  [facility];   WLE of mid back for melanoma and excision resection lipoma upper back   METACARPAL OSTEOTOMY Left 08/28/2016   Procedure: 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.;  Surgeon: Shari Easter, MD;  Location: [facility] OR;  Service: Orthopedics;  Laterality: Left;   MICROLARYNGOSCOPY WITH LASER N/A 01/25/2015   Procedure: MICROLARYNGOSCOPY ;  Surgeon: Vaughan Ricker, MD;  Location: [facility] OR;  Service: ENT;  Laterality: N/A;  Microlaryngoscopy with 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 with CO2 laser    REVERSE SHOULDER ARTHROPLASTY Right 12/05/2022   Procedure: REVERSE SHOULDER ARTHROPLASTY;  Surgeon: Cristy Bonner DASEN, MD;  Location: [facility];  Service: Orthopedics;  Laterality: Right;   SATURATION BIOPSY OF PROSTATE  01/18/2005   @[facility]   w/ anesthesia   SPACE OAR INSTILLATION N/A 06/27/2021   Procedure: SPACE OAR INSTILLATION;  Surgeon: Carolee Sherwood JONETTA DOUGLAS, MD;  Location: [facility];  Service: Urology;  Laterality: N/A;   TENDON REPAIR Right 2012   right  involving elbow, arm. hand   TONSILLECTOMY  1970    Family History  Problem Relation Age of Onset   Heart disease Mother    Stroke Father    Other Father        brain tumor-benign    Heart disease Sister    Heart disease Sister    Hypertension Sister    Colon cancer Neg Hx    Stomach cancer Neg Hx    Esophageal cancer Neg Hx    Pancreatic cancer Neg Hx    Liver disease Neg Hx     Social History   Tobacco Use   Smoking status: Former    Current packs/day: 0.00    Types: Cigarettes    Start date: 11/20/1976    Quit date: 1986    Years since quitting: 39.5   Smokeless tobacco: Never  Vaping Use   Vaping status: Never Used  Substance Use Topics   Alcohol use: Not Currently   Drug use: Never    ROS Refer to HPI for ROS details.  Objective:   Vitals: BP (!) 162/55   Temp 98.7 F (37.1 C)   Resp 20   SpO2 94%   Physical Exam Vitals and nursing note reviewed.  Constitutional:      General: He is not in acute distress.    Appearance: Normal appearance. He is well-developed. He is not ill-appearing, toxic-appearing or diaphoretic.  HENT:     Head: Normocephalic.  Cardiovascular:     Rate and Rhythm: Normal rate and regular rhythm.     Heart sounds: Normal heart sounds. No murmur heard. Pulmonary:     Effort: Accessory muscle usage present. No respiratory distress.     Breath sounds: No stridor. Examination of the right-upper field reveals wheezing and rhonchi. Examination of the left-upper field reveals wheezing and rhonchi. Examination of the right-middle field reveals rhonchi. Examination of the right-lower field reveals wheezing. Examination of the left-lower field reveals wheezing. Wheezing and rhonchi present. No decreased breath sounds or rales.  Chest:     Chest wall: No tenderness.  Abdominal:     Palpations: Abdomen is soft.     Tenderness: There is no abdominal tenderness. There is no right CVA tenderness or left CVA tenderness.  Skin:    General: Skin is warm and dry.  Neurological:     General: No focal deficit present.     Mental Status: He is alert and oriented to person, place, and time.  Psychiatric:  Mood and Affect:  Mood normal.        Behavior: Behavior normal.     Procedures  No results found for this or any previous visit (from the past 24 hours).  DG Chest 2 View Result Date: 08/29/2023 CLINICAL DATA:  Wheezing EXAM: CHEST - 2 VIEW COMPARISON:  X-ray 03/30/2021. FINDINGS: No consolidation, pneumothorax or effusion. No edema. Normal cardiopericardial silhouette. Calcified aorta. Degenerative changes along the spine. Right shoulder reverse arthroplasty identified at the edge of the imaging field. IMPRESSION: No acute cardiopulmonary disease. Electronically Signed   By: Ranell Bring M.D.   On: 08/29/2023 16:46     Assessment and Plan :     Discharge Instructions       1. Bronchitis (Primary) - ipratropium-albuterol (DUONEB) 0.5-2.5 (3) MG/3ML nebulizer solution 3 mL performed in UC for acute wheezing and persistent cough - DG Chest 2 View completed in UC shows no acute cardiopulmonary processes, no sign of consolidation or pneumonia, symptoms most likely secondary to acute bronchitis - azithromycin (ZITHROMAX Z-PAK) 250 MG tablet; Take 500 mg on day 1 followed by 250 mg for 4 days.  Take medication for a total of 5 days.  Dispense: 6 tablet; Refill: 0 - azelastine (ASTELIN) 0.1 % nasal spray; Place 1 spray into both nostrils 2 (two) times daily. Use in each nostril as directed  Dispense: 30 mL; Refill: 1 - albuterol (VENTOLIN HFA) 108 (90 Base) MCG/ACT inhaler; Inhale 1-2 puffs into the lungs every 6 (six) hours as needed for wheezing or shortness of breath.  Dispense: 8.5 g; Refill: 0 - promethazine-dextromethorphan (PROMETHAZINE-DM) 6.25-15 MG/5ML syrup; Take 5 mLs by mouth 4 (four) times daily as needed for cough.  Dispense: 118 mL; Refill: 0 -Continue to monitor symptoms for any change in severity if there is any escalation of current symptoms or development of new symptoms follow-up in ER for further evaluation and management.      Chanel Mcadams B Sherah Lund   Barney Gertsch, [place], [place] 08/29/23  [id number]

## 2023-08-29 NOTE — Discharge Instructions (Addendum)
  1. Bronchitis (Primary) - ipratropium-albuterol (DUONEB) 0.5-2.5 (3) MG/3ML nebulizer solution 3 mL performed in UC for acute wheezing and persistent cough - DG Chest 2 View completed in UC shows no acute cardiopulmonary processes, no sign of consolidation or pneumonia, symptoms most likely secondary to acute bronchitis - azithromycin (ZITHROMAX Z-PAK) 250 MG tablet; Take 500 mg on day 1 followed by 250 mg for 4 days.  Take medication for a total of 5 days.  Dispense: 6 tablet; Refill: 0 - azelastine (ASTELIN) 0.1 % nasal spray; Place 1 spray into both nostrils 2 (two) times daily. Use in each nostril as directed  Dispense: 30 mL; Refill: 1 - albuterol (VENTOLIN HFA) 108 (90 Base) MCG/ACT inhaler; Inhale 1-2 puffs into the lungs every 6 (six) hours as needed for wheezing or shortness of breath.  Dispense: 8.5 g; Refill: 0 - promethazine-dextromethorphan (PROMETHAZINE-DM) 6.25-15 MG/5ML syrup; Take 5 mLs by mouth 4 (four) times daily as needed for cough.  Dispense: 118 mL; Refill: 0 -Continue to monitor symptoms for any change in severity if there is any escalation of current symptoms or development of new symptoms follow-up in ER for further evaluation and management.

## 2023-09-01 ENCOUNTER — Ambulatory Visit: Admitting: Family Medicine

## 2023-09-01 ENCOUNTER — Encounter: Payer: Self-pay | Admitting: Family Medicine

## 2023-09-01 ENCOUNTER — Ambulatory Visit: Payer: Self-pay | Admitting: Emergency Medicine

## 2023-09-01 VITALS — BP 100/50 | HR 67 | Temp 97.7°F | Ht 70.5 in | Wt 192.0 lb

## 2023-09-01 DIAGNOSIS — J40 Bronchitis, not specified as acute or chronic: Secondary | ICD-10-CM

## 2023-09-01 DIAGNOSIS — G8929 Other chronic pain: Secondary | ICD-10-CM

## 2023-09-01 MED ORDER — FLUTICASONE-SALMETEROL 100-50 MCG/ACT IN AEPB: 1.0000 | INHALATION_SPRAY | Freq: Two times a day (BID) | RESPIRATORY_TRACT | 0 refills | Status: DC

## 2023-09-01 NOTE — H&P (View-Only) (Signed)
 PARTIAL VS TOTAL KNEE ADMISSION H&P  Patient is being admitted for left partial knee arthroplasty.  Subjective:  Chief Complaint:left knee pain.  HPI: Mario [name], 83 y.o. male, has a history of pain and functional disability in the left knee due to arthritis and has failed non-surgical conservative treatments for greater than 12 weeks to includeNSAID's and/or analgesics, corticosteriod injections, use of assistive devices, and activity modification.  Onset of symptoms was gradual, starting >10 years ago with gradually worsening course since that time. The patient noted no past surgery on the left knee(s).  Patient currently rates pain in the left knee(s) at 10 out of 10 with activity. Patient has night pain, worsening of pain with activity and weight bearing, pain that interferes with activities of daily living, and pain with passive range of motion.  Patient has evidence of periarticular osteophytes and joint space narrowing by imaging studies.  There is no active infection.  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   DEGENERATIVE JOINT DISEASE, 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/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   HYPERTENSION, BENIGN 05/06/2006   ELEVATED PROSTATE SPECIFIC ANTIGEN 05/06/2006   Melanoma (HCC) 04/10/2006   CATARACT 04/10/2006   Diverticulitis of colon 04/10/2006   NEPHROLITHIASIS 04/10/2006   Past Medical History:  Diagnosis Date    Anticoagulant long-term use    eliquis --- managed by cardiology   Arthritis    neck   Benign localized prostatic hyperplasia with lower urinary tract symptoms (LUTS)    Blister    06-25-2021  per pt has a small blister at end of tailbone from doing sit-up's, covers w/ band-aid   ED (erectile dysfunction)    First degree heart block    History of adenomatous polyp of colon    History of basal cell carcinoma (BCC) of skin    excision multiple area's   History of kidney stones    History of malignant melanoma of back 2000   mid back  s/p WLE 12/ 2011 stage 1, localized per pt and no recurrence   History of narrow angle glaucoma    per pt s/p laser both eyes no issues since approx 2006   History of squamous cell carcinoma of skin    excision multiple area's   Hypertension    Hypogonadism in male    Malignant neoplasm prostate (HCC) 03/2021   urologist--- dr bell/  radiation oncology-- dr patrcia;  dx 02/ 2066m  Gleason 3+4   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 (HCC)    per cardiac CT 02-24-2020   42mm   OSA on CPAP    per pt uses nightly   Persistent atrial fibrillation [facility])    cardiologist--- dr oneida. burnard;   DCCV 02-22-2020 and 12-01-2014;  03-02-2020  s/p afib ablation by Dr Kelsie   Seasonal allergies    Wears hearing aid in both ears     Past Surgical History:  Procedure Laterality Date   A-FLUTTER ABLATION N/A 01/17/2022  Procedure: A-FLUTTER ABLATION;  Surgeon: Nancey Eulas BRAVO, MD;  Location: [facility];  Service: Cardiovascular;  Laterality: N/A;   ATRIAL FIBRILLATION ABLATION N/A 03/02/2020   Procedure: ATRIAL FIBRILLATION ABLATION;  Surgeon: Kelsie Agent, MD;  Location: [facility];  Service: Cardiovascular;  Laterality: N/A;   CARDIOVERSION N/A 12/01/2014   Procedure: CARDIOVERSION;  Surgeon: Debby DELENA Sor, MD;  Location: [facility] ENDOSCOPY;  Service: Cardiovascular;  Laterality: N/A;   CARDIOVERSION N/A 02/22/2020   Procedure: CARDIOVERSION;  Surgeon:  Lonni Slain, MD;  Location: [facility] ENDOSCOPY;  Service: Cardiovascular;  Laterality: N/A;   CATARACT EXTRACTION W/ INTRAOCULAR LENS IMPLANT Bilateral    2000; 2004   COLONOSCOPY WITH ESOPHAGOGASTRODUODENOSCOPY (EGD)  09/2019   CYSTOSCOPY W/ URETEROSCOPY W/ LITHOTRIPSY  05/12/2003   @[facility]    ELBOW SURGERY Left 1984   tendon repair left arm   GOLD SEED IMPLANT N/A 06/27/2021   Procedure: GOLD SEED IMPLANT;  Surgeon: Carolee Sherwood JONETTA DOUGLAS, MD;  Location: [facility];  Service: Urology;  Laterality: N/A;   INCISION AND DRAINAGE OF WOUND Left 08/28/2016   Procedure: IRRIGATION AND DEBRIDEMENT WOUND;  Surgeon: Shari Easter, MD;  Location: [facility] OR;  Service: Orthopedics;  Laterality: Left;   LIPOMA EXCISION Right 04/18/2010   right thigh   MASS EXCISION Left 07/2001   excision left axila mass (benign)   MELANOMA EXCISION  01/2000   @ [facility];   WLE of mid back for melanoma and excision resection lipoma upper back   METACARPAL OSTEOTOMY Left 08/28/2016   Procedure: 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.;  Surgeon: Shari Easter, MD;  Location: [facility] OR;  Service: Orthopedics;  Laterality: Left;   MICROLARYNGOSCOPY WITH LASER N/A 01/25/2015   Procedure: MICROLARYNGOSCOPY ;  Surgeon: Vaughan Ricker, MD;  Location: [facility] OR;  Service: ENT;  Laterality: N/A;  Microlaryngoscopy with 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 with CO2 laser    REVERSE SHOULDER ARTHROPLASTY Right 12/05/2022   Procedure: REVERSE SHOULDER ARTHROPLASTY;  Surgeon: Cristy Bonner DASEN, MD;  Location: [facility];  Service: Orthopedics;  Laterality: Right;   SATURATION BIOPSY OF PROSTATE  01/18/2005   @[facility]   w/ anesthesia   SPACE OAR INSTILLATION N/A 06/27/2021   Procedure: SPACE OAR INSTILLATION;  Surgeon: Carolee Sherwood JONETTA DOUGLAS, MD;  Location: [facility];  Service: Urology;  Laterality: N/A;   TENDON REPAIR Right 2012   right  involving elbow, arm. hand   TONSILLECTOMY  1970    Current  Outpatient Medications  Medication Sig Dispense Refill Last Dose/Taking   albuterol  (VENTOLIN  HFA) 108 (90 Base) MCG/ACT inhaler Inhale 1-2 puffs into the lungs every 6 (six) hours as needed for wheezing or shortness of breath. 8.5 g 0    amLODipine  (NORVASC ) 10 MG tablet Take 1 tablet by mouth once daily 90 tablet 3    apixaban  (ELIQUIS ) 5 MG TABS tablet Take 1 tablet by mouth twice daily 180 tablet 1    azelastine  (ASTELIN ) 0.1 % nasal spray Place 1 spray into both nostrils 2 (two) times daily. Use in each nostril as directed 30 mL 1    azithromycin  (ZITHROMAX  Z-PAK) 250 MG tablet Take 500 mg on day 1 followed by 250 mg for 4 days.  Take medication for a total of 5 days. 6 tablet 0    Carboxymethylcellulose Sodium (THERATEARS PF OP) Place 1 drop into both eyes daily as needed (dry eyes).      carvedilol  (COREG ) 6.25 MG tablet TAKE 1 TABLET  BY MOUTH IN THE MORNING AND AT BEDTIME 180 tablet 0    fluticasone  (FLONASE ) 50 MCG/ACT nasal spray Place 2 sprays into both nostrils at bedtime as needed for allergies.      fluticasone -salmeterol (WIXELA INHUB) 100-50 MCG/ACT AEPB Inhale 1 puff into the lungs 2 (two) times daily. 1 each 0    GLUCOSAMINE-CHONDROITIN PO Take 1 tablet by mouth daily.       hydrALAZINE  (APRESOLINE ) 25 MG tablet Take 1 tablet by mouth twice daily 180 tablet 3    irbesartan  (AVAPRO ) 75 MG tablet Take 1 tablet by mouth once daily 90 tablet 3    Lutein 20 MG CAPS Take 20 mg by mouth daily.      METAMUCIL FIBER PO Take 1 Scoop by mouth 3 (three) times a week.      Multiple Vitamin (MULTIVITAMIN) tablet Take 1 tablet by mouth daily.      promethazine -dextromethorphan (PROMETHAZINE -DM) 6.25-15 MG/5ML syrup Take 5 mLs by mouth 4 (four) times daily as needed for cough. 118 mL 0    Saw Palmetto, Serenoa repens, (SAW PALMETTO PO) Take 2 tablets by mouth daily.      testosterone  cypionate (DEPOTESTOSTERONE CYPIONATE) 200 MG/ML injection Inject 100 mg into the muscle every Wednesday.       No current facility-administered medications for this visit.   Allergies  Allergen Reactions   Contrast Media [Iodinated Contrast Media] Shortness Of Breath, Rash and Other (See Comments)    First noted after Coronary CTA 02/24/20. Pt experienced wheezing, shortness of breath and diffuse rash/welts over face and trunk.   Bee Venom Other (See Comments)    Becomes light headed; faint. Has to take benadryl     Lisinopril Other (See Comments)    REACTION: severe GI upset requiring EGD--temporally related to lisinopril--tolerated micardis    Social History   Tobacco Use   Smoking status: Former    Current packs/day: 0.00    Types: Cigarettes    Start date: 11/20/1976    Quit date: 1986    Years since quitting: 39.5   Smokeless tobacco: Never  Substance Use Topics   Alcohol use: Not Currently    Family History  Problem Relation Age of Onset   Heart disease Mother    Stroke Father    Other Father        brain tumor-benign   Heart disease Sister    Heart disease Sister    Hypertension Sister    Colon cancer Neg Hx    Stomach cancer Neg Hx    Esophageal cancer Neg Hx    Pancreatic cancer Neg Hx    Liver disease Neg Hx      Review of Systems  Musculoskeletal:  Positive for arthralgias.  All other systems reviewed and are negative.   Objective:  Physical Exam Constitutional:      General: He is not in acute distress.    Appearance: Normal appearance. He is normal weight.  HENT:     Head: Normocephalic and atraumatic.  Eyes:     Extraocular Movements: Extraocular movements intact.     Conjunctiva/sclera: Conjunctivae normal.     Pupils: Pupils are equal, round, and reactive to light.  Cardiovascular:     Rate and Rhythm: Normal rate and regular rhythm.     Pulses: Normal pulses.     Heart sounds: Normal heart sounds.  Pulmonary:     Effort: Pulmonary effort is normal. No respiratory distress.     Breath sounds: Normal breath sounds.  Abdominal:  General:  Bowel sounds are normal. There is no distension.     Palpations: Abdomen is soft.     Tenderness: There is no abdominal tenderness.  Musculoskeletal:        General: Tenderness present.     Cervical back: Normal range of motion and neck supple.     Comments: TTP over medial joint line.  No calf tenderness, swelling, or erythema.  No overlying lesions of area of chief complaint.  Decreased strength and ROM due to elicited pain.  Pre-operative ROM 0-135.  Dorsiflexion and plantarflexion intact.  Stable to varus and valgus stress.  BLE appear grossly neurovascularly intact.  Gait mildly antalgic.   Lymphadenopathy:     Cervical: No cervical adenopathy.  Skin:    General: Skin is warm and dry.     Capillary Refill: Capillary refill takes less than 2 seconds.     Findings: No erythema or rash.  Neurological:     General: No focal deficit present.     Mental Status: He is alert and oriented to person, place, and time.  Psychiatric:        Mood and Affect: Mood normal.        Behavior: Behavior normal.     Vital signs in last 24 hours: @VSRANGES @  Labs:   Estimated body mass index is 27.16 kg/m as calculated from the following:   Height as of an earlier encounter on 09/01/23: 5' 10.5 (1.791 m).   Weight as of an earlier encounter on 09/01/23: 87.1 kg.   Imaging Review Plain radiographs demonstrate severe degenerative joint disease of the left knee(s), appearing isolated to the medial compartment. The overall alignment isnotable varus. The bone quality appears to be fair for age and reported activity level.      Assessment/Plan:  End stage arthritis, left knee, medial compartment  The patient history, physical examination, clinical judgment of the provider and imaging studies are consistent with end stage degenerative joint disease of the left knee(s) and partial knee arthroplasty is deemed medically necessary. The treatment options including medical management, injection therapy  arthroscopy and arthroplasty were discussed at length. The risks and benefits of partial knee arthroplasty were presented and reviewed, including the possibility of transitioning plan from partial to total knee arthroplasty. The risks due to aseptic loosening, infection, stiffness, patella tracking problems, thromboembolic complications and other imponderables were discussed. The patient acknowledged the explanation, agreed to proceed with the plan and consent was signed. Patient is being admitted for inpatient treatment for surgery, pain control, PT, OT, prophylactic antibiotics, VTE prophylaxis, progressive ambulation and ADL's and discharge planning. The patient is planning to be discharged home with outpatient PT.     Patient's anticipated LOS is less than 2 midnights, meeting these requirements: - Lives within 1 hour of care - Has a competent adult at home to recover with post-op recover - NO history of  - Chronic pain requiring opiods  - Diabetes  - Coronary Artery Disease  - Heart failure  - Heart attack  - Stroke  - DVT/VTE  - Respiratory Failure/COPD  - Renal failure  - Anemia  - Advanced Liver disease

## 2023-09-01 NOTE — H&P (Addendum)
 PARTIAL VS TOTAL KNEE ADMISSION H&P  Patient is being admitted for left partial knee arthroplasty.  Subjective:  Chief Complaint:left knee pain.  HPI: Mario [name], 83 y.o. male, has a history of pain and functional disability in the left knee due to arthritis and has failed non-surgical conservative treatments for greater than 12 weeks to includeNSAID's and/or analgesics, corticosteriod injections, use of assistive devices, and activity modification.  Onset of symptoms was gradual, starting >10 years ago with gradually worsening course since that time. The patient noted no past surgery on the left knee(s).  Patient currently rates pain in the left knee(s) at 10 out of 10 with activity. Patient has night pain, worsening of pain with activity and weight bearing, pain that interferes with activities of daily living, and pain with passive range of motion.  Patient has evidence of periarticular osteophytes and joint space narrowing by imaging studies.  There is no active infection.  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   DEGENERATIVE JOINT DISEASE, 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/13/2007   HYPERTENSION, BENIGN 05/06/2006   ELEVATED PROSTATE SPECIFIC ANTIGEN 05/06/2006   Melanoma (HCC) 04/10/2006   CATARACT 04/10/2006   Diverticulitis of colon 04/10/2006   NEPHROLITHIASIS 04/10/2006   Past Medical History:  Diagnosis Date    Anticoagulant long-term use    eliquis --- managed by cardiology   Arthritis    neck   Benign localized prostatic hyperplasia with lower urinary tract symptoms (LUTS)    Blister    06-25-2021  per pt has a small blister at end of tailbone from doing sit-up's, covers w/ band-aid   ED (erectile dysfunction)    First degree heart block    History of adenomatous polyp of colon    History of basal cell carcinoma (BCC) of skin    excision multiple area's   History of kidney stones    History of malignant melanoma of back 2000   mid back  s/p WLE 12/ 2011 stage 1, localized per pt and no recurrence   History of narrow angle glaucoma    per pt s/p laser both eyes no issues since approx 2006   History of squamous cell carcinoma of skin    excision multiple area's   Hypertension    Hypogonadism in male    Malignant neoplasm prostate (HCC) 03/2021   urologist--- dr bell/  radiation oncology-- dr patrcia;  dx 02/ 2066m  Gleason 3+4   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 (HCC)    per cardiac CT 02-24-2020   42mm   OSA on CPAP    per pt uses nightly   Persistent atrial fibrillation [facility])    cardiologist--- dr oneida. burnard;   DCCV 02-22-2020 and 12-01-2014;  03-02-2020  s/p afib ablation by Dr Kelsie   Seasonal allergies    Wears hearing aid in both ears     Past Surgical History:  Procedure Laterality Date   A-FLUTTER ABLATION N/A 01/17/2022  Procedure: A-FLUTTER ABLATION;  Surgeon: Nancey Eulas BRAVO, MD;  Location: [facility];  Service: Cardiovascular;  Laterality: N/A;   ATRIAL FIBRILLATION ABLATION N/A 03/02/2020   Procedure: ATRIAL FIBRILLATION ABLATION;  Surgeon: Kelsie Agent, MD;  Location: [facility];  Service: Cardiovascular;  Laterality: N/A;   CARDIOVERSION N/A 12/01/2014   Procedure: CARDIOVERSION;  Surgeon: Debby DELENA Sor, MD;  Location: [facility] ENDOSCOPY;  Service: Cardiovascular;  Laterality: N/A;   CARDIOVERSION N/A 02/22/2020   Procedure: CARDIOVERSION;  Surgeon:  Lonni Slain, MD;  Location: [facility] ENDOSCOPY;  Service: Cardiovascular;  Laterality: N/A;   CATARACT EXTRACTION W/ INTRAOCULAR LENS IMPLANT Bilateral    2000; 2004   COLONOSCOPY WITH ESOPHAGOGASTRODUODENOSCOPY (EGD)  09/2019   CYSTOSCOPY W/ URETEROSCOPY W/ LITHOTRIPSY  05/12/2003   @[facility]    ELBOW SURGERY Left 1984   tendon repair left arm   GOLD SEED IMPLANT N/A 06/27/2021   Procedure: GOLD SEED IMPLANT;  Surgeon: Carolee Sherwood JONETTA DOUGLAS, MD;  Location: [facility];  Service: Urology;  Laterality: N/A;   INCISION AND DRAINAGE OF WOUND Left 08/28/2016   Procedure: IRRIGATION AND DEBRIDEMENT WOUND;  Surgeon: Shari Easter, MD;  Location: [facility] OR;  Service: Orthopedics;  Laterality: Left;   LIPOMA EXCISION Right 04/18/2010   right thigh   MASS EXCISION Left 07/2001   excision left axila mass (benign)   MELANOMA EXCISION  01/2000   @ [facility];   WLE of mid back for melanoma and excision resection lipoma upper back   METACARPAL OSTEOTOMY Left 08/28/2016   Procedure: 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.;  Surgeon: Shari Easter, MD;  Location: [facility] OR;  Service: Orthopedics;  Laterality: Left;   MICROLARYNGOSCOPY WITH LASER N/A 01/25/2015   Procedure: MICROLARYNGOSCOPY ;  Surgeon: Vaughan Ricker, MD;  Location: [facility] OR;  Service: ENT;  Laterality: N/A;  Microlaryngoscopy with 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 with CO2 laser    REVERSE SHOULDER ARTHROPLASTY Right 12/05/2022   Procedure: REVERSE SHOULDER ARTHROPLASTY;  Surgeon: Cristy Bonner DASEN, MD;  Location: [facility];  Service: Orthopedics;  Laterality: Right;   SATURATION BIOPSY OF PROSTATE  01/18/2005   @[facility]   w/ anesthesia   SPACE OAR INSTILLATION N/A 06/27/2021   Procedure: SPACE OAR INSTILLATION;  Surgeon: Carolee Sherwood JONETTA DOUGLAS, MD;  Location: [facility];  Service: Urology;  Laterality: N/A;   TENDON REPAIR Right 2012   right  involving elbow, arm. hand   TONSILLECTOMY  1970    Current  Outpatient Medications  Medication Sig Dispense Refill Last Dose/Taking   albuterol  (VENTOLIN  HFA) 108 (90 Base) MCG/ACT inhaler Inhale 1-2 puffs into the lungs every 6 (six) hours as needed for wheezing or shortness of breath. 8.5 g 0    amLODipine  (NORVASC ) 10 MG tablet Take 1 tablet by mouth once daily 90 tablet 3    apixaban  (ELIQUIS ) 5 MG TABS tablet Take 1 tablet by mouth twice daily 180 tablet 1    azelastine  (ASTELIN ) 0.1 % nasal spray Place 1 spray into both nostrils 2 (two) times daily. Use in each nostril as directed 30 mL 1    azithromycin  (ZITHROMAX  Z-PAK) 250 MG tablet Take 500 mg on day 1 followed by 250 mg for 4 days.  Take medication for a total of 5 days. 6 tablet 0    Carboxymethylcellulose Sodium (THERATEARS PF OP) Place 1 drop into both eyes daily as needed (dry eyes).      carvedilol  (COREG ) 6.25 MG tablet TAKE 1 TABLET  BY MOUTH IN THE MORNING AND AT BEDTIME 180 tablet 0    fluticasone  (FLONASE ) 50 MCG/ACT nasal spray Place 2 sprays into both nostrils at bedtime as needed for allergies.      fluticasone -salmeterol (WIXELA INHUB) 100-50 MCG/ACT AEPB Inhale 1 puff into the lungs 2 (two) times daily. 1 each 0    GLUCOSAMINE-CHONDROITIN PO Take 1 tablet by mouth daily.       hydrALAZINE  (APRESOLINE ) 25 MG tablet Take 1 tablet by mouth twice daily 180 tablet 3    irbesartan  (AVAPRO ) 75 MG tablet Take 1 tablet by mouth once daily 90 tablet 3    Lutein 20 MG CAPS Take 20 mg by mouth daily.      METAMUCIL FIBER PO Take 1 Scoop by mouth 3 (three) times a week.      Multiple Vitamin (MULTIVITAMIN) tablet Take 1 tablet by mouth daily.      promethazine -dextromethorphan (PROMETHAZINE -DM) 6.25-15 MG/5ML syrup Take 5 mLs by mouth 4 (four) times daily as needed for cough. 118 mL 0    Saw Palmetto, Serenoa repens, (SAW PALMETTO PO) Take 2 tablets by mouth daily.      testosterone  cypionate (DEPOTESTOSTERONE CYPIONATE) 200 MG/ML injection Inject 100 mg into the muscle every Wednesday.       No current facility-administered medications for this visit.   Allergies  Allergen Reactions   Contrast Media [Iodinated Contrast Media] Shortness Of Breath, Rash and Other (See Comments)    First noted after Coronary CTA 02/24/20. Pt experienced wheezing, shortness of breath and diffuse rash/welts over face and trunk.   Bee Venom Other (See Comments)    Becomes light headed; faint. Has to take benadryl     Lisinopril Other (See Comments)    REACTION: severe GI upset requiring EGD--temporally related to lisinopril--tolerated micardis    Social History   Tobacco Use   Smoking status: Former    Current packs/day: 0.00    Types: Cigarettes    Start date: 11/20/1976    Quit date: 1986    Years since quitting: 39.5   Smokeless tobacco: Never  Substance Use Topics   Alcohol use: Not Currently    Family History  Problem Relation Age of Onset   Heart disease Mother    Stroke Father    Other Father        brain tumor-benign   Heart disease Sister    Heart disease Sister    Hypertension Sister    Colon cancer Neg Hx    Stomach cancer Neg Hx    Esophageal cancer Neg Hx    Pancreatic cancer Neg Hx    Liver disease Neg Hx      Review of Systems  Musculoskeletal:  Positive for arthralgias.  All other systems reviewed and are negative.   Objective:  Physical Exam Constitutional:      General: He is not in acute distress.    Appearance: Normal appearance. He is normal weight.  HENT:     Head: Normocephalic and atraumatic.  Eyes:     Extraocular Movements: Extraocular movements intact.     Conjunctiva/sclera: Conjunctivae normal.     Pupils: Pupils are equal, round, and reactive to light.  Cardiovascular:     Rate and Rhythm: Normal rate and regular rhythm.     Pulses: Normal pulses.     Heart sounds: Normal heart sounds.  Pulmonary:     Effort: Pulmonary effort is normal. No respiratory distress.     Breath sounds: Normal breath sounds.  Abdominal:  General:  Bowel sounds are normal. There is no distension.     Palpations: Abdomen is soft.     Tenderness: There is no abdominal tenderness.  Musculoskeletal:        General: Tenderness present.     Cervical back: Normal range of motion and neck supple.     Comments: TTP over medial joint line.  No calf tenderness, swelling, or erythema.  No overlying lesions of area of chief complaint.  Decreased strength and ROM due to elicited pain.  Pre-operative ROM 0-135.  Dorsiflexion and plantarflexion intact.  Stable to varus and valgus stress.  BLE appear grossly neurovascularly intact.  Gait mildly antalgic.   Lymphadenopathy:     Cervical: No cervical adenopathy.  Skin:    General: Skin is warm and dry.     Capillary Refill: Capillary refill takes less than 2 seconds.     Findings: No erythema or rash.  Neurological:     General: No focal deficit present.     Mental Status: He is alert and oriented to person, place, and time.  Psychiatric:        Mood and Affect: Mood normal.        Behavior: Behavior normal.     Vital signs in last 24 hours: @VSRANGES @  Labs:   Estimated body mass index is 27.16 kg/m as calculated from the following:   Height as of an earlier encounter on 09/01/23: 5' 10.5 (1.791 m).   Weight as of an earlier encounter on 09/01/23: 87.1 kg.   Imaging Review Plain radiographs demonstrate severe degenerative joint disease of the left knee(s), appearing isolated to the medial compartment. The overall alignment isnotable varus. The bone quality appears to be fair for age and reported activity level.      Assessment/Plan:  End stage arthritis, left knee, medial compartment  The patient history, physical examination, clinical judgment of the provider and imaging studies are consistent with end stage degenerative joint disease of the left knee(s) and partial knee arthroplasty is deemed medically necessary. The treatment options including medical management, injection therapy  arthroscopy and arthroplasty were discussed at length. The risks and benefits of partial knee arthroplasty were presented and reviewed, including the possibility of transitioning plan from partial to total knee arthroplasty. The risks due to aseptic loosening, infection, stiffness, patella tracking problems, thromboembolic complications and other imponderables were discussed. The patient acknowledged the explanation, agreed to proceed with the plan and consent was signed. Patient is being admitted for inpatient treatment for surgery, pain control, PT, OT, prophylactic antibiotics, VTE prophylaxis, progressive ambulation and ADL's and discharge planning. The patient is planning to be discharged home with outpatient PT.     Patient's anticipated LOS is less than 2 midnights, meeting these requirements: - Lives within 1 hour of care - Has a competent adult at home to recover with post-op recover - NO history of  - Chronic pain requiring opiods  - Diabetes  - Coronary Artery Disease  - Heart failure  - Heart attack  - Stroke  - DVT/VTE  - Respiratory Failure/COPD  - Renal failure  - Anemia  - Advanced Liver disease

## 2023-09-01 NOTE — Progress Notes (Signed)
 Acute Office Visit  Subjective:     Patient ID: Mario [name], male    DOB: Jan 28, 1941, 83 y.o.   MRN: [id number]  Chief Complaint  Patient presents with   Hospitalization Follow-up   Cough     Patient is in today for recent respiratory follow-up.  Discussed the use of AI scribe software for clinical note transcription with the patient, who gave verbal consent to proceed.  History of Present Illness Mario [name] is an 83 year old male with atrial fibrillation who presents with difficulty breathing following chemical inhalation.  He began experiencing difficulty breathing a week ago after inhaling chemicals while working on a tractor. Initially, he sought care at [facility] in [place], where he was prescribed prednisone  and albuterol . Despite this treatment, his symptoms worsened, prompting a visit to urgent care on Saturday, where he was prescribed a Z-Pak, cough medicine, and nose spray. He notes some improvement in symptoms since starting the Z-Pak.  Although his breathing has improved starting today, he still feels 'tight and wheezy'.  He experienced a low-grade fever of 99.48F and has been coughing up dark brown sputum, which has since lightened to gray and is now mostly clear. He also reports significant nasal congestion and sinus pressure, which he describes as 'bad'. He has been using the prescribed nose spray and notes that it helps clear his nasal passages temporarily.  He has completed a five-day course of prednisone  and reports feeling jittery and uncomfortable while on it. He continues to use his albuterol  inhaler, which provides some relief.     All review of systems negative except what is listed in the HPI      Objective:    BP (!) 100/50   Pulse 67   Temp 97.7 F (36.5 C) (Oral)   Ht 5' 10.5 (1.791 m)   Wt 192 lb (87.1 kg)   SpO2 97%   BMI 27.16 kg/m    Physical Exam Vitals reviewed.  Constitutional:      General: He is not in acute distress.     Appearance: Normal appearance.  HENT:     Head: Normocephalic and atraumatic.  Cardiovascular:     Rate and Rhythm: Normal rate and regular rhythm.  Pulmonary:     Effort: No respiratory distress.     Breath sounds: Wheezing and rhonchi present.  Skin:    General: Skin is warm and dry.  Neurological:     Mental Status: He is alert and oriented to person, place, and time.  Psychiatric:        Mood and Affect: Mood normal.        Behavior: Behavior normal.        Thought Content: Thought content normal.        Judgment: Judgment normal.       No results found for any visits on 09/01/23.      Assessment & Plan:   Problem List Items Addressed This Visit   None Visit Diagnoses       Bronchitis    -  Primary   Relevant Medications   fluticasone -salmeterol (WIXELA INHUB) 100-50 MCG/ACT AEPB        Finally noting some improvement today since starting antibiotics, but still not back to baseline.  Recommend complete antibiotic as prescribed Will try to write for [facility]ela to add ICS component for tight breath sounds Sputum is improving and no fevers.  Advise him to monitor symptoms and O2 with pulse ox. If not continuing to improve  over the next few days, recommend follow-up (2 recent CXRs, must recently on 08/29/23, were normal, no pneumonia). Consider repeat/advanced imaging if not continuing to improve.  Supportive measures discussed.  Patient aware of signs/symptoms requiring further/urgent evaluation.   Recommend complete resolution before proceeding with knee replacement in a few weeks.      Meds ordered this encounter  Medications   fluticasone -salmeterol (WIXELA INHUB) 100-50 MCG/ACT AEPB    Sig: Inhale 1 puff into the lungs 2 (two) times daily.    Dispense:  1 each    Refill:  0    Supervising Provider:   DOMENICA BLACKBIRD A [[id number]]    Return if symptoms worsen or fail to improve.  Mario [name] Mon, NP

## 2023-09-03 NOTE — Patient Instructions (Signed)
 SURGICAL WAITING ROOM VISITATION  Patients having surgery or a procedure may have no more than 2 support people in the waiting area - these visitors may rotate.    Children under the age of 29 must have an adult with them who is not the patient.  Visitors with respiratory illnesses are discouraged from visiting and should remain at home.  If the patient needs to stay at the hospital during part of their recovery, the visitor guidelines for inpatient rooms apply. Pre-op  nurse will coordinate an appropriate time for 1 support person to accompany patient in pre-op .  This support person may not rotate.    Please refer to the [facility] website for the visitor guidelines for Inpatients (after your surgery is over and you are in a regular room).       Your procedure is scheduled on: 09/17/23   Report to [facility] Main Entrance    Report to admitting at : 6:00 AM   Call this number if you have problems the morning of surgery [phone]   Do not eat food :After Midnight.   After Midnight you may have the following liquids until : 5:30 AM DAY OF SURGERY  Water Non-Citrus Juices (without pulp, NO RED-Apple, White grape, White cranberry) Black Coffee (NO MILK/CREAM OR CREAMERS, sugar ok)  Clear Tea (NO MILK/CREAM OR CREAMERS, sugar ok) regular and decaf                             Plain Jell-O (NO RED)                                           Fruit ices (not with fruit pulp, NO RED)                                     Popsicles (NO RED)                                                               Sports drinks like Gatorade (NO RED)   The day of surgery:  Drink ONE (1) Pre-Surgery Clear Ensure at : 5:30 AM the morning of surgery. Drink in one sitting. Do not sip.  This drink was given to you during your hospital  pre-op  appointment visit. Nothing else to drink after completing the  Pre-Surgery Clear Ensure or G2.          If you have questions, please contact your  surgeon's office.  FOLLOW ANY ADDITIONAL PRE OP INSTRUCTIONS YOU RECEIVED FROM YOUR SURGEON'S OFFICE!!!  Oral Hygiene is also important to reduce your risk of infection.                                    Remember - BRUSH YOUR TEETH THE MORNING OF SURGERY WITH YOUR REGULAR TOOTHPASTE  DENTURES WILL BE REMOVED PRIOR TO SURGERY PLEASE DO NOT APPLY Poly grip OR ADHESIVES!!!   Do NOT smoke after Midnight   Stop all vitamins  and herbal supplements 7 days before surgery.   Take these medicines the morning of surgery with A SIP OF WATER:hydralazine ,carvedilol ,amlodipine .Use inhalers as usual and bring them.   Bring CPAP mask and tubing day of surgery.                              You may not have any metal on your body including hair pins, jewelry, and body piercing             Do not wear lotions, powders, perfumes/cologne, or deodorant              Men may shave face and neck.   Do not bring valuables to the hospital. [facility] IS NOT             RESPONSIBLE   FOR VALUABLES.   Contacts, glasses, dentures or bridgework may not be worn into surgery.   Bring small overnight bag day of surgery.   DO NOT BRING YOUR HOME MEDICATIONS TO THE HOSPITAL. PHARMACY WILL DISPENSE MEDICATIONS LISTED ON YOUR MEDICATION LIST TO YOU DURING YOUR ADMISSION IN THE HOSPITAL!    Patients discharged on the day of surgery will not be allowed to drive home.  Someone NEEDS to stay with you for the first 24 hours after anesthesia.   Special Instructions: Bring a copy of your healthcare power of attorney and living will documents the day of surgery if you haven't scanned them before.              Please read over the following fact sheets you were given: IF YOU HAVE QUESTIONS ABOUT YOUR PRE-OP  INSTRUCTIONS PLEASE CALL [phone].   If you received a COVID test during your pre-op  visit  it is requested that you wear a mask when out in public, stay away from anyone that may not be feeling well and notify your  surgeon if you develop symptoms. If you test positive for Covid or have been in contact with anyone that has tested positive in the last 10 days please notify you surgeon.      Pre-operative 5 CHG Bath Instructions   You can play a key role in reducing the risk of infection after surgery. Your skin needs to be as free of germs as possible. You can reduce the number of germs on your skin by washing with CHG (chlorhexidine  gluconate) soap before surgery. CHG is an antiseptic soap that kills germs and continues to kill germs even after washing.   DO NOT use if you have an allergy to chlorhexidine /CHG or antibacterial soaps. If your skin becomes reddened or irritated, stop using the CHG and notify one of our RNs at [phone].   Please shower with the CHG soap starting 4 days before surgery using the following schedule:     Please keep in mind the following:  DO NOT shave, including legs and underarms, starting the day of your first shower.   You may shave your face at any point before/day of surgery.  Place clean sheets on your bed the day you start using CHG soap. Use a clean washcloth (not used since being washed) for each shower. DO NOT sleep with pets once you start using the CHG.   CHG Shower Instructions:  If you choose to wash your hair and private area, wash first with your normal shampoo/soap.  After you use shampoo/soap, rinse your hair and body thoroughly to remove shampoo/soap residue.  Turn  the water OFF and apply about 3 tablespoons (45 ml) of CHG soap to a CLEAN washcloth.  Apply CHG soap ONLY FROM YOUR NECK DOWN TO YOUR TOES (washing for 3-5 minutes)  DO NOT use CHG soap on face, private areas, open wounds, or sores.  Pay special attention to the area where your surgery is being performed.  If you are having back surgery, having someone wash your back for you may be helpful. Wait 2 minutes after CHG soap is applied, then you may rinse off the CHG soap.  Pat dry with a  clean towel  Put on clean clothes/pajamas   If you choose to wear lotion, please use ONLY the CHG-compatible lotions on the back of this paper.     Additional instructions for the day of surgery: DO NOT APPLY any lotions, deodorants, cologne, or perfumes.   Put on clean/comfortable clothes.  Brush your teeth.  Ask your nurse before applying any prescription medications to the skin.   CHG Compatible Lotions   Aveeno Moisturizing lotion  Cetaphil Moisturizing Cream  Cetaphil Moisturizing Lotion  Clairol Herbal Essence Moisturizing Lotion, Dry Skin  Clairol Herbal Essence Moisturizing Lotion, Extra Dry Skin  Clairol Herbal Essence Moisturizing Lotion, Normal Skin  Curel Age Defying Therapeutic Moisturizing Lotion with Alpha Hydroxy  Curel Extreme Care Body Lotion  Curel Soothing Hands Moisturizing Hand Lotion  Curel Therapeutic Moisturizing Cream, Fragrance-Free  Curel Therapeutic Moisturizing Lotion, Fragrance-Free  Curel Therapeutic Moisturizing Lotion, Original Formula  Eucerin Daily Replenishing Lotion  Eucerin Dry Skin Therapy Plus Alpha Hydroxy Crme  Eucerin Dry Skin Therapy Plus Alpha Hydroxy Lotion  Eucerin Original Crme  Eucerin Original Lotion  Eucerin Plus Crme Eucerin Plus Lotion  Eucerin TriLipid Replenishing Lotion  Keri Anti-Bacterial Hand Lotion  Keri Deep Conditioning Original Lotion Dry Skin Formula Softly Scented  Keri Deep Conditioning Original Lotion, Fragrance Free Sensitive Skin Formula  Keri Lotion Fast Absorbing Fragrance Free Sensitive Skin Formula  Keri Lotion Fast Absorbing Softly Scented Dry Skin Formula  Keri Original Lotion  Keri Skin Renewal Lotion Keri Silky Smooth Lotion  Keri Silky Smooth Sensitive Skin Lotion  Nivea Body Creamy Conditioning Oil  Nivea Body Extra Enriched Lotion  Nivea Body Original Lotion  Nivea Body Sheer Moisturizing Lotion Nivea Crme  Nivea Skin Firming Lotion  NutraDerm 30 Skin Lotion  NutraDerm Skin Lotion   NutraDerm Therapeutic Skin Cream  NutraDerm Therapeutic Skin Lotion  ProShield Protective Hand Cream  Provon moisturizing lotion   Incentive Spirometer  An incentive spirometer is a tool that can help keep your lungs clear and active. This tool measures how well you are filling your lungs with each breath. Taking long deep breaths may help reverse or decrease the chance of developing breathing (pulmonary) problems (especially infection) following: A long period of time when you are unable to move or be active. BEFORE THE PROCEDURE  If the spirometer includes an indicator to show your best effort, your nurse or respiratory therapist will set it to a desired goal. If possible, sit up straight or lean slightly forward. Try not to slouch. Hold the incentive spirometer in an upright position. INSTRUCTIONS FOR USE  Sit on the edge of your bed if possible, or sit up as far as you can in bed or on a chair. Hold the incentive spirometer in an upright position. Breathe out normally. Place the mouthpiece in your mouth and seal your lips tightly around it. Breathe in slowly and as deeply as possible, raising the piston or the  ball toward the top of the column. Hold your breath for 3-5 seconds or for as long as possible. Allow the piston or ball to fall to the bottom of the column. Remove the mouthpiece from your mouth and breathe out normally. Rest for a few seconds and repeat Steps 1 through 7 at least 10 times every 1-2 hours when you are awake. Take your time and take a few normal breaths between deep breaths. The spirometer may include an indicator to show your best effort. Use the indicator as a goal to work toward during each repetition. After each set of 10 deep breaths, practice coughing to be sure your lungs are clear. If you have an incision (the cut made at the time of surgery), support your incision when coughing by placing a pillow or rolled up towels firmly against it. Once you are able to  get out of bed, walk around indoors and cough well. You may stop using the incentive spirometer when instructed by your caregiver.  RISKS AND COMPLICATIONS Take your time so you do not get dizzy or light-headed. If you are in pain, you may need to take or ask for pain medication before doing incentive spirometry. It is harder to take a deep breath if you are having pain. AFTER USE Rest and breathe slowly and easily. It can be helpful to keep track of a log of your progress. Your caregiver can provide you with a simple table to help with this. If you are using the spirometer at home, follow these instructions: SEEK MEDICAL CARE IF:  You are having difficultly using the spirometer. You have trouble using the spirometer as often as instructed. Your pain medication is not giving enough relief while using the spirometer. You develop fever of 100.5 F (38.1 C) or higher. SEEK IMMEDIATE MEDICAL CARE IF:  You cough up bloody sputum that had not been present before. You develop fever of 102 F (38.9 C) or greater. You develop worsening pain at or near the incision site. MAKE SURE YOU:  Understand these instructions. Will watch your condition. Will get help right away if you are not doing well or get worse. Document Released: 06/10/2006 Document Revised: 04/22/2011 Document Reviewed: 08/11/2006 [facility] Patient Information 2014 [place], [place].   ________________________________________________________________________

## 2023-09-04 ENCOUNTER — Encounter (HOSPITAL_COMMUNITY): Payer: Self-pay

## 2023-09-04 ENCOUNTER — Other Ambulatory Visit: Payer: Self-pay

## 2023-09-04 ENCOUNTER — Encounter (HOSPITAL_COMMUNITY)
Admission: RE | Admit: 2023-09-04 | Discharge: 2023-09-04 | Disposition: A | Discharge status: Home / Self Care | Source: Ambulatory Visit | Attending: Orthopedic Surgery | Admitting: Orthopedic Surgery

## 2023-09-04 VITALS — BP 135/61 | HR 57 | Temp 98.6°F | Ht 70.5 in | Wt 189.0 lb

## 2023-09-04 DIAGNOSIS — G8929 Other chronic pain: Secondary | ICD-10-CM | POA: Insufficient documentation

## 2023-09-04 DIAGNOSIS — M25562 Pain in left knee: Secondary | ICD-10-CM | POA: Insufficient documentation

## 2023-09-04 DIAGNOSIS — Z01812 Encounter for preprocedural laboratory examination: Secondary | ICD-10-CM | POA: Insufficient documentation

## 2023-09-04 DIAGNOSIS — Z01818 Encounter for other preprocedural examination: Secondary | ICD-10-CM

## 2023-09-04 LAB — SURGICAL PCR SCREEN
MRSA, PCR: NEGATIVE
Staphylococcus aureus: NEGATIVE

## 2023-09-04 LAB — CBC WITH DIFFERENTIAL/PLATELET
Abs Immature Granulocytes: 0.41 K/uL — ABNORMAL HIGH (ref 0.00–0.07)
Basophils Absolute: 0 K/uL (ref 0.0–0.1)
Basophils Relative: 0 %
Eosinophils Absolute: 0.2 K/uL (ref 0.0–0.5)
Eosinophils Relative: 3 %
HCT: 45.5 % (ref 39.0–52.0)
Hemoglobin: 15.5 g/dL (ref 13.0–17.0)
Immature Granulocytes: 6 %
Lymphocytes Relative: 17 %
Lymphs Abs: 1.3 K/uL (ref 0.7–4.0)
MCH: 32.6 pg (ref 26.0–34.0)
MCHC: 34.1 g/dL (ref 30.0–36.0)
MCV: 95.8 fL (ref 80.0–100.0)
Monocytes Absolute: 1 K/uL (ref 0.1–1.0)
Monocytes Relative: 13 %
Neutro Abs: 4.6 K/uL (ref 1.7–7.7)
Neutrophils Relative %: 61 %
Platelets: 210 K/uL (ref 150–400)
RBC: 4.75 MIL/uL (ref 4.22–5.81)
RDW: 12.7 % (ref 11.5–15.5)
WBC: 7.5 K/uL (ref 4.0–10.5)
nRBC: 0 % (ref 0.0–0.2)

## 2023-09-04 LAB — COMPREHENSIVE METABOLIC PANEL WITH GFR
ALT: 18 U/L (ref 0–44)
AST: 22 U/L (ref 15–41)
Albumin: 3.7 g/dL (ref 3.5–5.0)
Alkaline Phosphatase: 78 U/L (ref 38–126)
Anion gap: 8 (ref 5–15)
BUN: 17 mg/dL (ref 8–23)
CO2: 24 mmol/L (ref 22–32)
Calcium: 9.4 mg/dL (ref 8.9–10.3)
Chloride: 103 mmol/L (ref 98–111)
Creatinine, Ser: 0.7 mg/dL (ref 0.61–1.24)
GFR, Estimated: >60 mL/min (ref >60)
Glucose, Bld: 89 mg/dL (ref 70–99)
Potassium: 4.3 mmol/L (ref 3.5–5.1)
Sodium: 135 mmol/L (ref 135–145)
Total Bilirubin: 1.1 mg/dL (ref 0.0–1.2)
Total Protein: 7.1 g/dL (ref 6.5–8.1)

## 2023-09-04 LAB — TYPE AND SCREEN
ABO/RH(D): O POS
Antibody Screen: NEGATIVE

## 2023-09-04 NOTE — Progress Notes (Addendum)
 For Anesthesia: PCP - Jason Leita Repine, FNP  Cardiologist - Burnard Debby LABOR, MD : Clearance: 07/29/23 Mealor, Eulas BRAVO, MD Electrophysiology   Bowel Prep reminder:  Chest x-ray - 08/29/23. CT cardiac: 12/26/21 EKG - 07/29/23 Stress Test -  ECHO - 12/20/22 Cardiac Cath -  Pacemaker/ICD device last checked: Pacemaker orders received: Device Rep notified:  Spinal Cord Stimulator:N/A  Sleep Study - Yes CPAP - Yes  Fasting Blood Sugar - N/A Checks Blood Sugar _____ times a day Date and result of last Hgb A1c-5.4: 07/31/23  Last dose of GLP1 agonist- N/A GLP1 instructions:   Last dose of SGLT-2 inhibitors- N/A SGLT-2 instructions:   Blood Thinner Instructions:Eliquis : Will be on hold after: 09/13/23 Aspirin  Instructions: Last Dose:  Activity level: Can go up a flight of stairs and activities of daily living without stopping and without chest pain and/or shortness of breath   Able to exercise without chest pain and/or shortness of breath     Anesthesia review: Hx: HTN,Afib,First degree HB,OSA(CPAP).Recovering from respiratory illness with shortness of breath, wheezing, cough, chest discomfort x 1 week with slightly worsening severity : 08/29/23. Pt. Verbalized feeling better.  Patient denies shortness of breath, fever, cough and chest pain at PAT appointment   Patient verbalized understanding of instructions that were reviewed over the telephone.

## 2023-09-05 ENCOUNTER — Telehealth (INDEPENDENT_AMBULATORY_CARE_PROVIDER_SITE_OTHER): Payer: Self-pay | Admitting: Otolaryngology

## 2023-09-05 NOTE — Telephone Encounter (Signed)
 09/05/23 Patient confirmed correct location for 07/29 appt.

## 2023-09-08 ENCOUNTER — Encounter (HOSPITAL_COMMUNITY): Payer: Self-pay
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
## 2023-09-09 ENCOUNTER — Encounter (INDEPENDENT_AMBULATORY_CARE_PROVIDER_SITE_OTHER): Payer: Self-pay | Admitting: Otolaryngology

## 2023-09-09 ENCOUNTER — Ambulatory Visit (INDEPENDENT_AMBULATORY_CARE_PROVIDER_SITE_OTHER): Admitting: Otolaryngology

## 2023-09-09 VITALS — BP 148/69 | HR 78

## 2023-09-09 DIAGNOSIS — J343 Hypertrophy of nasal turbinates: Secondary | ICD-10-CM | POA: Diagnosis not present

## 2023-09-09 DIAGNOSIS — J3489 Other specified disorders of nose and nasal sinuses: Secondary | ICD-10-CM

## 2023-09-09 DIAGNOSIS — J3089 Other allergic rhinitis: Secondary | ICD-10-CM

## 2023-09-09 DIAGNOSIS — J342 Deviated nasal septum: Secondary | ICD-10-CM

## 2023-09-09 DIAGNOSIS — R0981 Nasal congestion: Secondary | ICD-10-CM

## 2023-09-09 DIAGNOSIS — R0982 Postnasal drip: Secondary | ICD-10-CM

## 2023-09-09 DIAGNOSIS — Z87891 Personal history of nicotine dependence: Secondary | ICD-10-CM | POA: Diagnosis not present

## 2023-09-09 DIAGNOSIS — J329 Chronic sinusitis, unspecified: Secondary | ICD-10-CM

## 2023-09-09 MED ORDER — FLUTICASONE PROPIONATE 50 MCG/ACT NA SUSP: 2.0000 | Freq: Every day | NASAL | 6 refills | Status: AC

## 2023-09-09 MED ORDER — LEVOCETIRIZINE DIHYDROCHLORIDE 5 MG PO TABS: 5.0000 mg | ORAL_TABLET | Freq: Every evening | ORAL | 3 refills | Status: AC

## 2023-09-09 NOTE — Progress Notes (Signed)
 ENT CONSULT:  Reason for Consult: chronic nasal congestion and nasal obstruction    HPI: Discussed the use of AI scribe software for clinical note transcription with the patient, who gave verbal consent to proceed.  History of Present Illness Mario [name] is an 83 year old male with hx of nasal surgery in his 20's, environmental allergies, who presents with dental pain and chronic nasal congestion. He was referred by his dentist for evaluation of sinus fullness noted on imaging.  He has been experiencing significant dental pain, particularly when eating, which led to a visit to his dentist two months ago. A panoramic dental X-ray revealed opacification in the left-sided sinuses, according to the dentist.  He has a longstanding history of sinus problems and chronic nasal congestion, which have been present for most of his life. 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 He was unaware of the perforation until later, and his sinus issues have persisted since the surgery.  Currently, he is experiencing nasal congestion and is recovering from a recent episode of bronchitis. No smoking history is noted, and he does not have any dental implants.  He has been using Flonase  nasal spray nightly. He recently stopped using other nasal sprays and allergy medications as of last Thursday or Friday.  Records Reviewed:  PCP office visit 09/01/23 Mario [name] is an 83 year old male with atrial fibrillation who presents with difficulty breathing following chemical inhalation.   He began experiencing difficulty breathing a week ago after inhaling chemicals while working on a tractor. Initially, he sought care at [facility] in [place], where he was prescribed prednisone  and albuterol . Despite this treatment, his symptoms worsened, prompting a visit to urgent care on Saturday, where he was prescribed a Z-Pak, cough medicine, and nose spray. He notes some  improvement in symptoms since starting the Z-Pak.   Although his breathing has improved starting today, he still feels 'tight and wheezy'.   He experienced a low-grade fever of 99.71F and has been coughing up dark brown sputum, which has since lightened to gray and is now mostly clear. He also reports significant nasal congestion and sinus pressure, which he describes as 'bad'. He has been using the prescribed nose spray and notes that it helps clear his nasal passages temporarily.   He has completed a five-day course of prednisone  and reports feeling jittery and uncomfortable while on it. He continues to use his albuterol  inhaler, which provides some relief.  Dx with bronchitis and given Wixela inhaler Rx   Past Medical History:  Diagnosis Date   Anticoagulant long-term use    eliquis --- managed by cardiology   Arthritis    neck   Benign localized prostatic hyperplasia with lower urinary tract symptoms (LUTS)    Blister    06-25-2021  per pt has a small blister at end of tailbone from doing sit-up's, covers w/ band-aid   ED (erectile dysfunction)    First degree heart block    History of adenomatous polyp of colon    History of basal cell carcinoma (BCC) of skin    excision multiple area's   History of kidney stones    History of malignant melanoma of back 2000   mid back  s/p WLE 12/ 2011 stage 1, localized per pt and no recurrence   History of narrow angle glaucoma    per pt s/p laser both eyes no issues since approx 2006   History of squamous cell carcinoma of  skin    excision multiple area's   Hypertension    Hypogonadism in male    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 burnard;   DCCV 02-22-2020 and 12-01-2014;   03-02-2020  s/p afib ablation by Dr Kelsie   Pneumonia    Seasonal allergies    Wears hearing aid in both ears     Past Surgical History:  Procedure Laterality Date   A-FLUTTER ABLATION N/A 01/17/2022   Procedure: A-FLUTTER ABLATION;  Surgeon: Nancey Eulas BRAVO, MD;  Location: [facility] INVASIVE CV LAB;  Service: Cardiovascular;  Laterality: N/A;   ATRIAL FIBRILLATION ABLATION N/A 03/02/2020   Procedure: ATRIAL FIBRILLATION ABLATION;  Surgeon: Kelsie Agent, MD;  Location: [facility] INVASIVE CV LAB;  Service: Cardiovascular;  Laterality: N/A;   CARDIOVERSION N/A 12/01/2014   Procedure: CARDIOVERSION;  Surgeon: Debby DELENA burnard, MD;  Location: [facility] ENDOSCOPY;  Service: Cardiovascular;  Laterality: N/A;   CARDIOVERSION N/A 02/22/2020   Procedure: CARDIOVERSION;  Surgeon: Lonni Slain, MD;  Location: [facility] ENDOSCOPY;  Service: Cardiovascular;  Laterality: N/A;   CATARACT EXTRACTION W/ INTRAOCULAR LENS IMPLANT Bilateral    2000; 2004   COLONOSCOPY WITH ESOPHAGOGASTRODUODENOSCOPY (EGD)  09/2019   CYSTOSCOPY W/ URETEROSCOPY W/ LITHOTRIPSY  05/12/2003   @[facility]    ELBOW SURGERY Left 1984   tendon repair left arm   GOLD SEED IMPLANT N/A 06/27/2021   Procedure: GOLD SEED IMPLANT;  Surgeon: Carolee Sherwood JONETTA DOUGLAS, MD;  Location: [facility];  Service: Urology;  Laterality: N/A;   INCISION AND DRAINAGE OF WOUND Left 08/28/2016   Procedure: IRRIGATION AND DEBRIDEMENT WOUND;  Surgeon: Shari Easter, MD;  Location: [facility] OR;  Service: Orthopedics;  Laterality: Left;   LIPOMA EXCISION Right 04/18/2010   right thigh   MASS EXCISION Left 07/2001   excision left axila mass (benign)   MELANOMA EXCISION  01/2000   @ [facility];   WLE of mid back for melanoma and excision resection lipoma upper back   METACARPAL OSTEOTOMY Left 08/28/2016   Procedure: 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.;  Surgeon: Shari Easter, MD;  Location: [facility] OR;  Service: Orthopedics;  Laterality: Left;   MICROLARYNGOSCOPY WITH LASER N/A  01/25/2015   Procedure: MICROLARYNGOSCOPY ;  Surgeon: Vaughan Ricker, MD;  Location: [facility] OR;  Service: ENT;  Laterality: N/A;  Microlaryngoscopy with 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 with CO2 laser    REVERSE SHOULDER ARTHROPLASTY Right 12/05/2022   Procedure: REVERSE SHOULDER ARTHROPLASTY;  Surgeon: Cristy Bonner ONEIDA, MD;  Location: [facility];  Service: Orthopedics;  Laterality: Right;   SATURATION BIOPSY OF PROSTATE  01/18/2005   @[facility]   w/ anesthesia   SPACE OAR INSTILLATION N/A 06/27/2021   Procedure: SPACE OAR INSTILLATION;  Surgeon: Carolee Sherwood JONETTA DOUGLAS, MD;  Location: [facility];  Service: Urology;  Laterality: N/A;   TENDON REPAIR Right 2012   right  involving elbow, arm. hand   TONSILLECTOMY  1970    Family History  Problem Relation Age of Onset   Heart disease Mother    Stroke Father    Other Father        brain tumor-benign   Heart disease Sister  Heart disease Sister    Hypertension Sister    Colon cancer Neg Hx    Stomach cancer Neg Hx    Esophageal cancer Neg Hx    Pancreatic cancer Neg Hx    Liver disease Neg Hx     Social History:  reports that he quit smoking about 39 years ago. His smoking use included cigarettes. He started smoking about 46 years ago. He has never used smokeless tobacco. He reports that he does not currently use alcohol. He reports that he does not use drugs.  Allergies:  Allergies  Allergen Reactions   Contrast Media [Iodinated Contrast Media] Shortness Of Breath, Rash and Other (See Comments)    First noted after Coronary CTA 02/24/20. Pt experienced wheezing, shortness of breath and diffuse rash/welts over face and trunk.   Bee Venom Other (See Comments)    Becomes light headed; faint. Has to take benadryl     Lisinopril Other (See Comments)    REACTION: severe GI upset requiring EGD--temporally related to lisinopril--tolerated micardis    Medications: I have reviewed the patient's current  medications.  The PMH, PSH, Medications, Allergies, and SH were reviewed and updated.  ROS: Constitutional: Negative for fever, weight loss and weight gain. Cardiovascular: Negative for chest pain and dyspnea on exertion. Respiratory: Is not experiencing shortness of breath at rest. Gastrointestinal: Negative for nausea and vomiting. Neurological: Negative for headaches. Psychiatric: The patient is not nervous/anxious  Blood pressure (!) 148/69, pulse 78, SpO2 97%. There is no height or weight on file to calculate BMI.  PHYSICAL EXAM:  Exam: General: Well-developed, well-nourished Respiratory Respiratory effort: Equal inspiration and expiration without stridor Cardiovascular Peripheral Vascular: Warm extremities with equal color/perfusion Eyes: No nystagmus with equal extraocular motion bilaterally Neuro/Psych/Balance: Patient oriented to person, place, and time; Appropriate mood and affect; Gait is intact with no imbalance; Cranial nerves I-XII are intact Head and Face Inspection: Normocephalic and atraumatic without mass or lesion Palpation: Facial skeleton intact without bony stepoffs Salivary Glands: No mass or tenderness Facial Strength: Facial motility symmetric and full bilaterally ENT Pinna: External ear intact and fully developed External canal: Canal is patent with intact skin Tympanic Membrane: Clear and mobile External Nose: No scar or anatomic deformity Internal Nose: Septum is with a large perforation. No polyp, or purulence. Mucosal edema and erythema present.  Bilateral inferior turbinate hypertrophy.  Lips, Teeth, and gums: Mucosa and teeth intact and viable TMJ: No pain to palpation with full mobility Oral cavity/oropharynx: No erythema or exudate, no lesions present Nasopharynx: No mass or lesion with intact mucosa Neck Neck and Trachea: Midline trachea without mass or lesion Thyroid : No mass or nodularity Lymphatics: No  lymphadenopathy  Procedure:   PROCEDURE NOTE: nasal endoscopy  Preoperative diagnosis: chronic sinusitis symptoms  Postoperative diagnosis: same + septal perforation  Procedure: Diagnostic nasal endoscopy (68768)  Surgeon: Elena Larry, M.D.  Anesthesia: Topical lidocaine  and Afrin  H&P REVIEW: The patient's history and physical were reviewed today prior to procedure. All medications were reviewed and updated as well. Complications: None Condition is stable throughout exam Indications and consent: The patient presents with symptoms of chronic sinusitis not responding to previous therapies. All the risks, benefits, and potential complications were reviewed with the patient preoperatively and informed consent was obtained. The time out was completed with confirmation of the correct procedure.   Procedure: The patient was seated upright in the clinic. Topical lidocaine  and Afrin were applied to the nasal cavity. After adequate anesthesia had occurred, the rigid nasal endoscope  was passed into the nasal cavity. The nasal mucosa, turbinates, septum, and sinus drainage pathways were visualized bilaterally. This revealed no purulence or significant secretions that might be cultured. There were was no polyps or sites of significant inflammation. The mucosa was intact and there was no crusting present.  Large posterior septal perforation was present. The scope was then slowly withdrawn and the patient tolerated the procedure well. There were no complications or blood loss.   Studies Reviewed: CXR 08/29/23 FINDINGS: No consolidation, pneumothorax or effusion. No edema. Normal cardiopericardial silhouette. Calcified aorta. Degenerative changes along the spine. Right shoulder reverse arthroplasty identified at the edge of the imaging field.   IMPRESSION: No acute cardiopulmonary disease.  Assessment/Plan: Encounter Diagnoses  Name Primary?   Chronic nasal congestion Yes   Nasal  obstruction    Environmental and seasonal allergies    Hypertrophy of both inferior nasal turbinates    Nasal septal deviation    Post-nasal drip     Assessment and Plan Assessment & Plan Chronic left-sided sinus congestion on dental films Chronic nasal congestion and sx of difficulty breathing through his nose. No purulence or polyps noted on nasal endoscopy but left sided middle turbinate had severe edema and erythema, he also had a large posterior septal perforation - Order non-contrasted sinus CT to evaluate extent of sinus inflammation - Continue Flonase  nasal spray nightly. - Prescribed Xyzal   5 mg at night. - Instructed on saline nasal rinses. RTC after imaging        Thank you for allowing me to participate in the care of this patient. Please do not hesitate to contact me with any questions or concerns.   Elena Larry, MD Otolaryngology [facility] Phone: [phone] Fax: [phone]    09/09/2023, 12:52 PM

## 2023-09-09 NOTE — Patient Instructions (Signed)
 Lloyd Huger Med Nasal Saline Rinse   - start nasal saline rinses with NeilMed Bottle available over the counter or online to help with nasal congestion

## 2023-09-16 NOTE — Anesthesia Preprocedure Evaluation (Addendum)
 Anesthesia Evaluation  Patient identified by MRN, date of birth, ID band Patient awake    Reviewed: Allergy & Precautions, NPO status , Patient's Chart, lab work & pertinent test results  Airway Mallampati: II  TM Distance: >3 FB Neck ROM: Full    Dental  (+) Teeth Intact, Dental Advisory Given   Pulmonary sleep apnea and Continuous Positive Airway Pressure Ventilation , former smoker   breath sounds clear to auscultation       Cardiovascular hypertension, Pt. on medications + CAD  + dysrhythmias Atrial Fibrillation  Rhythm:Regular Rate:Normal     Neuro/Psych negative neurological ROS  negative psych ROS   GI/Hepatic negative GI ROS, Neg liver ROS,,,  Endo/Other  negative endocrine ROS    Renal/GU Renal disease     Musculoskeletal  (+) Arthritis ,    Abdominal   Peds  Hematology negative hematology ROS (+)   Anesthesia Other Findings   Reproductive/Obstetrics                              Anesthesia Physical Anesthesia Plan  ASA: 3  Anesthesia Plan: Spinal   Post-op Pain Management: Regional block*   Induction: Intravenous  PONV Risk Score and Plan: 2 and Ondansetron  and Propofol  infusion  Airway Management Planned: Natural Airway and Nasal Cannula  Additional Equipment: None  Intra-op Plan:   Post-operative Plan:   Informed Consent: I have reviewed the patients History and Physical, chart, labs and discussed the procedure including the risks, benefits and alternatives for the proposed anesthesia with the patient or authorized representative who has indicated his/her understanding and acceptance.       Plan Discussed with: CRNA  Anesthesia Plan Comments: (Lab Results      Component                Value               Date                      WBC                      7.5                 09/04/2023                HGB                      15.5                09/04/2023                 HCT                      45.5                09/04/2023                MCV                      95.8                09/04/2023                PLT                      210  09/04/2023           )         Anesthesia Quick Evaluation

## 2023-09-17 ENCOUNTER — Encounter (HOSPITAL_COMMUNITY)
Admission: RE | Disposition: A | Discharge status: Home / Self Care | Payer: Self-pay | Source: Ambulatory Visit | Attending: Orthopedic Surgery

## 2023-09-17 ENCOUNTER — Ambulatory Visit (HOSPITAL_COMMUNITY): Payer: Self-pay | Admitting: Physician Assistant

## 2023-09-17 ENCOUNTER — Encounter (HOSPITAL_COMMUNITY): Payer: Self-pay | Admitting: Orthopedic Surgery

## 2023-09-17 ENCOUNTER — Ambulatory Visit (HOSPITAL_COMMUNITY)
Admission: RE | Admit: 2023-09-17 | Discharge: 2023-09-17 | Disposition: A | Discharge status: Home / Self Care | Source: Ambulatory Visit | Attending: Orthopedic Surgery | Admitting: Orthopedic Surgery

## 2023-09-17 ENCOUNTER — Other Ambulatory Visit: Payer: Self-pay

## 2023-09-17 ENCOUNTER — Ambulatory Visit (HOSPITAL_COMMUNITY)

## 2023-09-17 DIAGNOSIS — I2510 Atherosclerotic heart disease of native coronary artery without angina pectoris: Secondary | ICD-10-CM

## 2023-09-17 DIAGNOSIS — I4891 Unspecified atrial fibrillation: Secondary | ICD-10-CM | POA: Diagnosis not present

## 2023-09-17 DIAGNOSIS — M1712 Unilateral primary osteoarthritis, left knee: Secondary | ICD-10-CM

## 2023-09-17 DIAGNOSIS — I10 Essential (primary) hypertension: Secondary | ICD-10-CM | POA: Insufficient documentation

## 2023-09-17 DIAGNOSIS — G4730 Sleep apnea, unspecified: Secondary | ICD-10-CM | POA: Insufficient documentation

## 2023-09-17 DIAGNOSIS — G4733 Obstructive sleep apnea (adult) (pediatric): Secondary | ICD-10-CM | POA: Insufficient documentation

## 2023-09-17 DIAGNOSIS — Z7951 Long term (current) use of inhaled steroids: Secondary | ICD-10-CM | POA: Diagnosis not present

## 2023-09-17 DIAGNOSIS — Z7901 Long term (current) use of anticoagulants: Secondary | ICD-10-CM | POA: Diagnosis not present

## 2023-09-17 DIAGNOSIS — Z79899 Other long term (current) drug therapy: Secondary | ICD-10-CM | POA: Diagnosis not present

## 2023-09-17 DIAGNOSIS — Z87891 Personal history of nicotine dependence: Secondary | ICD-10-CM | POA: Insufficient documentation

## 2023-09-17 HISTORY — PX: PARTIAL KNEE ARTHROPLASTY: SHX2174

## 2023-09-17 LAB — ABO/RH: ABO/RH(D): O POS

## 2023-09-17 SURGERY — ARTHROPLASTY, KNEE, UNICOMPARTMENTAL
Anesthesia: Spinal | Site: Knee | Laterality: Left

## 2023-09-17 MED ORDER — SODIUM CHLORIDE 0.9 % IR SOLN
Status: DC | PRN
  Administered 2023-09-17: 1000 mL

## 2023-09-17 MED ORDER — DROPERIDOL 2.5 MG/ML IJ SOLN: 0.6250 mg | Freq: Once | INTRAMUSCULAR | Status: DC | PRN

## 2023-09-17 MED ORDER — DEXAMETHASONE SODIUM PHOSPHATE 10 MG/ML IJ SOLN
INTRAMUSCULAR | Status: AC
  Filled 2023-09-17: qty 1

## 2023-09-17 MED ORDER — BUPIVACAINE LIPOSOME 1.3 % IJ SUSP
INTRAMUSCULAR | Status: DC | PRN
  Administered 2023-09-17: 50 mL

## 2023-09-17 MED ORDER — OXYCODONE HCL 5 MG PO TABS: 5.0000 mg | ORAL_TABLET | ORAL | 0 refills | Status: AC | PRN

## 2023-09-17 MED ORDER — EPHEDRINE SULFATE-NACL 50-0.9 MG/10ML-% IV SOSY
PREFILLED_SYRINGE | INTRAVENOUS | Status: DC | PRN
Start: 2023-09-17 — End: 2023-09-17
  Administered 2023-09-17 (×2): 5 mg via INTRAVENOUS
  Administered 2023-09-17: 2.5 mg via INTRAVENOUS
  Administered 2023-09-17: 5 mg via INTRAVENOUS
  Administered 2023-09-17: 2.5 mg via INTRAVENOUS

## 2023-09-17 MED ORDER — ALBUMIN HUMAN 5 % IV SOLN
INTRAVENOUS | Status: AC
  Filled 2023-09-17: qty 250

## 2023-09-17 MED ORDER — CHLORHEXIDINE GLUCONATE 0.12 % MT SOLN
15.0000 mL | Freq: Once | OROMUCOSAL | Status: AC
  Administered 2023-09-17: 15 mL via OROMUCOSAL

## 2023-09-17 MED ORDER — PHENOL 1.4 % MT LIQD: 1.0000 | OROMUCOSAL | Status: DC | PRN

## 2023-09-17 MED ORDER — LACTATED RINGERS IV BOLUS: 250.0000 mL | Freq: Once | INTRAVENOUS | Status: DC

## 2023-09-17 MED ORDER — ACETAMINOPHEN 10 MG/ML IV SOLN: 1000.0000 mg | Freq: Once | INTRAVENOUS | Status: DC | PRN

## 2023-09-17 MED ORDER — BUPIVACAINE IN DEXTROSE 0.75-8.25 % IT SOLN
INTRATHECAL | Status: DC | PRN
  Administered 2023-09-17: 2 mL via INTRATHECAL

## 2023-09-17 MED ORDER — POLYETHYLENE GLYCOL 3350 17 G PO PACK: 17.0000 g | PACK | Freq: Every day | ORAL | 0 refills | Status: DC

## 2023-09-17 MED ORDER — HYDROMORPHONE HCL 1 MG/ML IJ SOLN: 0.5000 mg | INTRAMUSCULAR | Status: DC | PRN

## 2023-09-17 MED ORDER — OXYCODONE HCL 5 MG PO TABS: 5.0000 mg | ORAL_TABLET | Freq: Once | ORAL | Status: DC | PRN

## 2023-09-17 MED ORDER — BUPIVACAINE LIPOSOME 1.3 % IJ SUSP: 20.0000 mL | Freq: Once | INTRAMUSCULAR | Status: DC

## 2023-09-17 MED ORDER — ROPIVACAINE HCL 5 MG/ML IJ SOLN
INTRAMUSCULAR | Status: DC | PRN
  Administered 2023-09-17: 30 mL via PERINEURAL

## 2023-09-17 MED ORDER — LACTATED RINGERS IV SOLN: INTRAVENOUS | Status: DC

## 2023-09-17 MED ORDER — OMEPRAZOLE 40 MG PO CPDR: 40.0000 mg | DELAYED_RELEASE_CAPSULE | Freq: Every day | ORAL | 0 refills | Status: DC

## 2023-09-17 MED ORDER — ONDANSETRON HCL 4 MG PO TABS: 4.0000 mg | ORAL_TABLET | Freq: Three times a day (TID) | ORAL | 0 refills | Status: AC | PRN

## 2023-09-17 MED ORDER — ONDANSETRON HCL 4 MG/2ML IJ SOLN: 4.0000 mg | Freq: Once | INTRAMUSCULAR | Status: DC | PRN

## 2023-09-17 MED ORDER — FENTANYL CITRATE (PF) 100 MCG/2ML IJ SOLN
INTRAMUSCULAR | Status: DC | PRN
  Administered 2023-09-17: 50 ug via INTRAVENOUS

## 2023-09-17 MED ORDER — LACTATED RINGERS IV BOLUS
500.0000 mL | Freq: Once | INTRAVENOUS | Status: AC
  Administered 2023-09-17: 500 mL via INTRAVENOUS

## 2023-09-17 MED ORDER — DEXAMETHASONE SODIUM PHOSPHATE 10 MG/ML IJ SOLN
8.0000 mg | Freq: Once | INTRAMUSCULAR | Status: AC
  Administered 2023-09-17: 8 mg via INTRAVENOUS

## 2023-09-17 MED ORDER — POVIDONE-IODINE 10 % EX SWAB: 2.0000 | Freq: Once | CUTANEOUS | Status: DC

## 2023-09-17 MED ORDER — CEFAZOLIN SODIUM-DEXTROSE 2-4 GM/100ML-% IV SOLN
2.0000 g | INTRAVENOUS | Status: AC
  Administered 2023-09-17: 2 g via INTRAVENOUS
  Filled 2023-09-17: qty 100

## 2023-09-17 MED ORDER — ONDANSETRON HCL 4 MG/2ML IJ SOLN
INTRAMUSCULAR | Status: AC
  Filled 2023-09-17: qty 2

## 2023-09-17 MED ORDER — METHOCARBAMOL 500 MG PO TABS: 500.0000 mg | ORAL_TABLET | Freq: Four times a day (QID) | ORAL | Status: DC | PRN

## 2023-09-17 MED ORDER — ONDANSETRON HCL 4 MG/2ML IJ SOLN
INTRAMUSCULAR | Status: DC | PRN
  Administered 2023-09-17: 4 mg via INTRAVENOUS

## 2023-09-17 MED ORDER — SODIUM CHLORIDE 0.9 % IV SOLN: INTRAVENOUS | Status: DC

## 2023-09-17 MED ORDER — ONDANSETRON HCL 4 MG/2ML IJ SOLN: 4.0000 mg | Freq: Four times a day (QID) | INTRAMUSCULAR | Status: DC | PRN

## 2023-09-17 MED ORDER — ACETAMINOPHEN 500 MG PO TABS
1000.0000 mg | ORAL_TABLET | Freq: Once | ORAL | Status: AC
  Administered 2023-09-17: 1000 mg via ORAL
  Filled 2023-09-17: qty 2

## 2023-09-17 MED ORDER — BUPIVACAINE LIPOSOME 1.3 % IJ SUSP
INTRAMUSCULAR | Status: AC
  Filled 2023-09-17: qty 20

## 2023-09-17 MED ORDER — APIXABAN 5 MG PO TABS: 5.0000 mg | ORAL_TABLET | Freq: Two times a day (BID) | ORAL | Status: AC

## 2023-09-17 MED ORDER — PROPOFOL 500 MG/50ML IV EMUL
INTRAVENOUS | Status: DC | PRN
  Administered 2023-09-17: 20 mg via INTRAVENOUS
  Administered 2023-09-17: 55 ug/kg/min via INTRAVENOUS
  Administered 2023-09-17: 20 mg via INTRAVENOUS

## 2023-09-17 MED ORDER — OXYCODONE HCL 5 MG PO TABS: 5.0000 mg | ORAL_TABLET | ORAL | Status: DC | PRN

## 2023-09-17 MED ORDER — CEFAZOLIN SODIUM-DEXTROSE 2-4 GM/100ML-% IV SOLN: 2.0000 g | Freq: Four times a day (QID) | INTRAVENOUS | Status: DC

## 2023-09-17 MED ORDER — ACETAMINOPHEN 500 MG PO TABS: 1000.0000 mg | ORAL_TABLET | Freq: Four times a day (QID) | ORAL | Status: DC

## 2023-09-17 MED ORDER — PROPOFOL 1000 MG/100ML IV EMUL
INTRAVENOUS | Status: AC
  Filled 2023-09-17: qty 100

## 2023-09-17 MED ORDER — MEPERIDINE HCL 25 MG/ML IJ SOLN: 6.2500 mg | INTRAMUSCULAR | Status: DC | PRN

## 2023-09-17 MED ORDER — ACETAMINOPHEN 500 MG PO TABS: 1000.0000 mg | ORAL_TABLET | Freq: Three times a day (TID) | ORAL | Status: AC | PRN

## 2023-09-17 MED ORDER — OXYCODONE HCL 5 MG/5ML PO SOLN: 5.0000 mg | Freq: Once | ORAL | Status: DC | PRN

## 2023-09-17 MED ORDER — METHOCARBAMOL 500 MG PO TABS: 500.0000 mg | ORAL_TABLET | Freq: Three times a day (TID) | ORAL | 0 refills | Status: AC | PRN

## 2023-09-17 MED ORDER — STERILE WATER FOR IRRIGATION IR SOLN
Status: DC | PRN
  Administered 2023-09-17: 2000 mL

## 2023-09-17 MED ORDER — 0.9 % SODIUM CHLORIDE (POUR BTL) OPTIME
TOPICAL | Status: DC | PRN
  Administered 2023-09-17: 1000 mL

## 2023-09-17 MED ORDER — CELECOXIB 100 MG PO CAPS: 100.0000 mg | ORAL_CAPSULE | Freq: Two times a day (BID) | ORAL | 0 refills | Status: DC

## 2023-09-17 MED ORDER — ORAL CARE MOUTH RINSE: 15.0000 mL | Freq: Once | OROMUCOSAL | Status: AC

## 2023-09-17 MED ORDER — ONDANSETRON HCL 4 MG PO TABS: 4.0000 mg | ORAL_TABLET | Freq: Four times a day (QID) | ORAL | Status: DC | PRN

## 2023-09-17 MED ORDER — AMISULPRIDE (ANTIEMETIC) 5 MG/2ML IV SOLN: 10.0000 mg | Freq: Once | INTRAVENOUS | Status: DC | PRN

## 2023-09-17 MED ORDER — TRANEXAMIC ACID-NACL 1000-0.7 MG/100ML-% IV SOLN
1000.0000 mg | INTRAVENOUS | Status: AC
  Administered 2023-09-17: 1000 mg via INTRAVENOUS
  Filled 2023-09-17: qty 100

## 2023-09-17 MED ORDER — MENTHOL 3 MG MT LOZG: 1.0000 | LOZENGE | OROMUCOSAL | Status: DC | PRN

## 2023-09-17 MED ORDER — KETOROLAC TROMETHAMINE 15 MG/ML IJ SOLN: 7.5000 mg | Freq: Four times a day (QID) | INTRAMUSCULAR | Status: DC

## 2023-09-17 MED ORDER — HYDROMORPHONE HCL 1 MG/ML IJ SOLN: 0.2500 mg | INTRAMUSCULAR | Status: DC | PRN

## 2023-09-17 MED ORDER — EPHEDRINE 5 MG/ML INJ
INTRAVENOUS | Status: AC
  Filled 2023-09-17: qty 5

## 2023-09-17 MED ORDER — PHENYLEPHRINE HCL-NACL 20-0.9 MG/250ML-% IV SOLN
INTRAVENOUS | Status: DC | PRN
Start: 2023-09-17 — End: 2023-09-17
  Administered 2023-09-17: 15 ug/min via INTRAVENOUS

## 2023-09-17 MED ORDER — METHOCARBAMOL 1000 MG/10ML IJ SOLN: 500.0000 mg | Freq: Four times a day (QID) | INTRAMUSCULAR | Status: DC | PRN

## 2023-09-17 MED ORDER — FENTANYL CITRATE (PF) 100 MCG/2ML IJ SOLN
INTRAMUSCULAR | Status: AC
  Filled 2023-09-17: qty 2

## 2023-09-17 MED ORDER — BUPIVACAINE-EPINEPHRINE (PF) 0.25% -1:200000 IJ SOLN
INTRAMUSCULAR | Status: AC
  Filled 2023-09-17: qty 30

## 2023-09-17 MED ORDER — ALBUMIN HUMAN 5 % IV SOLN
INTRAVENOUS | Status: DC | PRN
Start: 2023-09-17 — End: 2023-09-17

## 2023-09-17 SURGICAL SUPPLY — 54 items
BAG COUNTER SPONGE SURGICOUNT (BAG) ×2 IMPLANT
BEARING PERSONA SZ F 8 (Knees) IMPLANT
BLADE SAW RECIPROCATING 77.5 (BLADE) ×2 IMPLANT
BLADE SAW SGTL 13.0X1.19X90.0M (BLADE) ×2 IMPLANT
BNDG COHESIVE 3X5 TAN ST LF (GAUZE/BANDAGES/DRESSINGS) ×2 IMPLANT
BNDG ELASTIC 6X10 VLCR STRL LF (GAUZE/BANDAGES/DRESSINGS) ×2 IMPLANT
BOWL SMART MIX CTS (DISPOSABLE) ×2 IMPLANT
CEMENT BONE R 1X40 (Cement) IMPLANT
CHLORAPREP W/TINT 26 (MISCELLANEOUS) ×2 IMPLANT
COMPONENT TIB PERSONA SZ F LT (Knees) IMPLANT
COVER SURGICAL LIGHT HANDLE (MISCELLANEOUS) ×2 IMPLANT
CUFF TRNQT CYL 34X4.125X (TOURNIQUET CUFF) ×2 IMPLANT
DERMABOND ADVANCED .7 DNX12 (GAUZE/BANDAGES/DRESSINGS) ×2 IMPLANT
DRAPE INCISE 23X17 STRL (DRAPES) IMPLANT
DRAPE INCISE IOBAN 85X60 (DRAPES) ×2 IMPLANT
DRAPE SHEET LG 3/4 BI-LAMINATE (DRAPES) ×2 IMPLANT
DRAPE U-SHAPE 47X51 STRL (DRAPES) ×2 IMPLANT
DRSG AQUACEL AG ADV 3.5X 6 (GAUZE/BANDAGES/DRESSINGS) IMPLANT
DRSG AQUACEL AG ADV 3.5X10 (GAUZE/BANDAGES/DRESSINGS) IMPLANT
ELECT REM PT RETURN 15FT ADLT (MISCELLANEOUS) ×2 IMPLANT
GAUZE SPONGE 4X4 12PLY STRL (GAUZE/BANDAGES/DRESSINGS) ×2 IMPLANT
GLOVE BIO SURGEON STRL SZ 6.5 (GLOVE) ×2 IMPLANT
GLOVE BIOGEL PI IND STRL 6.5 (GLOVE) ×4 IMPLANT
GLOVE BIOGEL PI IND STRL 8 (GLOVE) ×2 IMPLANT
GLOVE SURG ORTHO 8.0 STRL STRW (GLOVE) ×4 IMPLANT
GOWN STRL REUS W/ TWL XL LVL3 (GOWN DISPOSABLE) ×4 IMPLANT
HOOD PEEL AWAY T7 (MISCELLANEOUS) ×6 IMPLANT
INSERTER TIP PARTIAL KNEE (MISCELLANEOUS) IMPLANT
KIT TURNOVER KIT A (KITS) ×2 IMPLANT
LAVAGE JET IRRISEPT WOUND (IRRIGATION / IRRIGATOR) IMPLANT
MANIFOLD NEPTUNE II (INSTRUMENTS) ×2 IMPLANT
MARKER SKIN DUAL TIP RULER LAB (MISCELLANEOUS) ×2 IMPLANT
NDL SPNL 18GX3.5 QUINCKE PK (NEEDLE) IMPLANT
NEEDLE SPNL 18GX3.5 QUINCKE PK (NEEDLE) ×1 IMPLANT
NS IRRIG 1000ML POUR BTL (IV SOLUTION) ×2 IMPLANT
PACK TOTAL KNEE CUSTOM (KITS) ×2 IMPLANT
PENCIL SMOKE EVACUATOR (MISCELLANEOUS) ×2 IMPLANT
PIN DRILL HDLS TROCAR 75 4PK (PIN) IMPLANT
SCREW HEADED 33MM KNEE (MISCELLANEOUS) IMPLANT
SCREW HEADED 48MM KNEE (MISCELLANEOUS) IMPLANT
SET HNDPC FAN SPRY TIP SCT (DISPOSABLE) ×2 IMPLANT
SOLUTION IRRIG SURGIPHOR (IV SOLUTION) IMPLANT
STRIP CLOSURE SKIN 1/2X4 (GAUZE/BANDAGES/DRESSINGS) ×2 IMPLANT
SURFACE ARTC PRSNA FEM SZ 5 KN (Orthopedic Implant) IMPLANT
SUT MNCRL AB 3-0 PS2 18 (SUTURE) ×2 IMPLANT
SUT MNCRL AB 3-0 PS2 27 (SUTURE) ×2 IMPLANT
SUT STRATAFIX 14 PDO 48 VLT (SUTURE) ×2 IMPLANT
SUT VIC AB 0 CT1 36 (SUTURE) ×2 IMPLANT
SUT VIC AB 2-0 CT2 27 (SUTURE) ×2 IMPLANT
SUTURE STRATFX 0 PDS 27 VIOLET (SUTURE) ×2 IMPLANT
TRAY FOLEY MTR SLVR 16FR STAT (SET/KITS/TRAYS/PACK) IMPLANT
TUBE SUCTION HIGH CAP CLEAR NV (SUCTIONS) ×2 IMPLANT
UNDERPAD 30X36 HEAVY ABSORB (UNDERPADS AND DIAPERS) ×2 IMPLANT
WRAP KNEE MAXI GEL POST OP (GAUZE/BANDAGES/DRESSINGS) ×2 IMPLANT

## 2023-09-17 NOTE — Anesthesia Procedure Notes (Addendum)
 Procedure Name: MAC Date/Time: 09/17/2023 8:33 AM  Performed by: Landy Chip HERO, CRNAPre-anesthesia Checklist: Patient identified, Emergency Drugs available, Suction available, Patient being monitored and Timeout performed Patient Re-evaluated:Patient Re-evaluated prior to induction Oxygen  Delivery Method: Nasal cannula Preoxygenation: Pre-oxygenation with 100% oxygen  Induction Type: IV induction Placement Confirmation: positive ETCO2 Dental Injury: Teeth and Oropharynx as per pre-operative assessment

## 2023-09-17 NOTE — Progress Notes (Signed)
 Orthopedic Tech Progress Note Patient Details:  Mario [name] January 30, 1941 [id number]  Ortho Devices Type of Ortho Device: Bone foam zero knee Ortho Device/Splint Location: left Ortho Device/Splint Interventions: Ordered, Application, Adjustment   Post Interventions Patient Tolerated: Well Instructions Provided: Adjustment of device, Care of device  Waylan Thom Loving 09/17/2023, 3:07 PM

## 2023-09-17 NOTE — Anesthesia Procedure Notes (Signed)
 Date/Time: 09/17/2023 8:39 AM  Performed by: Landy Chip HERO, CRNAOxygen Delivery Method: Simple face mask Placement Confirmation: positive ETCO2 Dental Injury: Teeth and Oropharynx as per pre-operative assessment

## 2023-09-17 NOTE — Discharge Instructions (Signed)
 INSTRUCTIONS AFTER JOINT REPLACEMENT   Remove items at home which could result in a fall. This includes throw rugs or furniture in walking pathways ICE to the affected joint every three hours while awake for 30 minutes at a time, for at least the first 3-5 days, and then as needed for pain and swelling.  Continue to use ice for pain and swelling. You may notice swelling that will progress down to the foot and ankle.  This is normal after surgery.  Elevate your leg when you are not up walking on it.   Continue to use the breathing machine you got in the hospital (incentive spirometer) which will help keep your temperature down.  It is common for your temperature to cycle up and down following surgery, especially at night when you are not up moving around and exerting yourself.  The breathing machine keeps your lungs expanded and your temperature down.  DIET:  As you were doing prior to hospitalization, we recommend a well-balanced diet.  DRESSING / WOUND CARE / SHOWERING:  Keep the surgical dressing until follow up.  The dressing is water proof, so you can shower without any extra covering.  IF THE DRESSING FALLS OFF or the wound gets wet inside, change the dressing with sterile gauze.  Please use good hand washing techniques before changing the dressing.  Do not use any lotions or creams on the incision until instructed by your surgeon.    ACTIVITY  Increase activity slowly as tolerated, but follow the weight bearing instructions below.   No driving for 6 weeks or until further direction given by your physician.  You cannot drive while taking narcotics.  No lifting or carrying greater than 10 lbs. until further directed by your surgeon. Avoid periods of inactivity such as sitting longer than an hour when not asleep. This helps prevent blood clots.  You may return to work once you are authorized by your doctor.   WEIGHT BEARING: Weight bearing as tolerated with assist device (walker, cane, etc) as  directed, use it as long as suggested by your surgeon or therapist, typically at least 4-6 weeks.  EXERCISES  Results after joint replacement surgery are often greatly improved when you follow the exercise, range of motion and muscle strengthening exercises prescribed by your doctor. Safety measures are also important to protect the joint from further injury. Any time any of these exercises cause you to have increased pain or swelling, decrease what you are doing until you are comfortable again and then slowly increase them. If you have problems or questions, call your caregiver or physical therapist for advice.   Rehabilitation is important following a joint replacement. After just a few days of immobilization, the muscles of the leg can become weakened and shrink (atrophy).  These exercises are designed to build up the tone and strength of the thigh and leg muscles and to improve motion. Often times heat used for twenty to thirty minutes before working out will loosen up your tissues and help with improving the range of motion but do not use heat for the first two weeks following surgery (sometimes heat can increase post-operative swelling).   These exercises can be done on a training (exercise) mat, on the floor, on a table or on a bed. Use whatever works the best and is most comfortable for you.    Use music or television while you are exercising so that the exercises are a pleasant break in your day. This will make your life  better with the exercises acting as a break in your routine that you can look forward to.   Perform all exercises about fifteen times, three times per day or as directed.  You should exercise both the operative leg and the other leg as well.  Exercises include:   Quad Sets - Tighten up the muscle on the front of the thigh (Quad) and hold for 5-10 seconds.   Straight Leg Raises - With your knee straight (if you were given a brace, keep it on), lift the leg to 60 degrees, hold  for 3 seconds, and slowly lower the leg.  Perform this exercise against resistance later as your leg gets stronger.  Leg Slides: Lying on your back, slowly slide your foot toward your buttocks, bending your knee up off the floor (only go as far as is comfortable). Then slowly slide your foot back down until your leg is flat on the floor again.  Angel Wings: Lying on your back spread your legs to the side as far apart as you can without causing discomfort.  Hamstring Strength:  Lying on your back, push your heel against the floor with your leg straight by tightening up the muscles of your buttocks.  Repeat, but this time bend your knee to a comfortable angle, and push your heel against the floor.  You may put a pillow under the heel to make it more comfortable if necessary.   A rehabilitation program following joint replacement surgery can speed recovery and prevent re-injury in the future due to weakened muscles. Contact your doctor or a physical therapist for more information on knee rehabilitation.   CONSTIPATION:  Constipation is defined medically as fewer than three stools per week and severe constipation as less than one stool per week.  Even if you have a regular bowel pattern at home, your normal regimen is likely to be disrupted due to multiple reasons following surgery.  Combination of anesthesia, postoperative narcotics, change in appetite and fluid intake all can affect your bowels.   YOU MUST use at least one of the following options; they are listed in order of increasing strength to get the job done.  They are all available over the counter, and you may need to use some, POSSIBLY even all of these options:    Drink plenty of fluids (prune juice may be helpful) and high fiber foods Colace 100 mg by mouth twice a day  Senokot for constipation as directed and as needed Dulcolax (bisacodyl), take with full glass of water  Miralax (polyethylene glycol) once or twice a day as needed.  If you  have tried all these things and are unable to have a bowel movement in the first 3-4 days after surgery call either your surgeon or your primary doctor.    If you experience loose stools or diarrhea, hold the medications until you stool forms back up.  If your symptoms do not get better within 1 week or if they get worse, check with your doctor.  If you experience "the worst abdominal pain ever" or develop nausea or vomiting, please contact the office immediately for further recommendations for treatment.  ITCHING:  If you experience itching with your medications, try taking only a single pain pill, or even half a pain pill at a time.  You can also use Benadryl over the counter for itching or also to help with sleep.   TED HOSE STOCKINGS:  Use stockings on both legs until for at least 2 weeks or  as directed by physician office. They may be removed at night for sleeping.  MEDICATIONS:  See your medication summary on the "After Visit Summary" that nursing will review with you.  You may have some home medications which will be placed on hold until you complete the course of blood thinner medication.  It is important for you to complete the blood thinner medication as prescribed.  Blood clot prevention (DVT Prophylaxis): After surgery you are at an increased risk for a blood clot. You are to resume your Eliquis after surgery to help reduce your risk of getting a blood clot.  For the first two days after surgery, take a half a dose (2.5 mg Eliquis) twice a day.  Then on the third day, resume your full dose (5 mg Eliquis) twice a day.  Take your Eliquis for a minimum of 4 weeks from a post-operative standpoint.  Then follow the direction of your regular prescribing provider.  Signs of a pulmonary embolus (blood clot in the lungs) include sudden short of breath, feeling lightheaded or dizzy, chest pain with a deep breath, rapid pulse rapid breathing.  Signs of a blood clot in your arms or legs include new  unexplained swelling and cramping, warm, red or darkened skin around the painful area.  Please call the office or 911 right away if these signs or symptoms develop.  PRECAUTIONS:   If you experience chest pain or shortness of breath - call 911 immediately for transfer to the hospital emergency department.   If you develop a fever greater that 101 F, purulent drainage from wound, increased redness or drainage from wound, foul odor from the wound/dressing, or calf pain - CONTACT YOUR SURGEON.                                                   FOLLOW-UP APPOINTMENTS:  If you do not already have a post-op appointment, please call the office for an appointment to be seen by your surgeon.  Guidelines for how soon to be seen are listed in your "After Visit Summary", but are typically between 2-3 weeks after surgery.  If you have a specialized bandage, you may be told to follow up 1 week after surgery.  OTHER INSTRUCTIONS:  Knee Replacement:  Do not place pillow under knee, focus on keeping the knee straight while resting.  Place foam block, curve side up under heel at all times except when walking.  DO NOT modify, tear, cut, or change the foam block in any way.  POST-OPERATIVE OPIOID TAPER INSTRUCTIONS: It is important to wean off of your opioid medication as soon as possible. If you do not need pain medication after your surgery it is ok to stop day one. Opioids include: Codeine, Hydrocodone(Norco, Vicodin), Oxycodone(Percocet, oxycontin) and hydromorphone amongst others.  Long term and even short term use of opiods can cause: Increased pain response Dependence Constipation Depression Respiratory depression And more.  Withdrawal symptoms can include Flu like symptoms Nausea, vomiting And more Techniques to manage these symptoms Hydrate well Eat regular healthy meals Stay active Use relaxation techniques(deep breathing, meditating, yoga) Do Not substitute Alcohol to help with tapering If you  have been on opioids for less than two weeks and do not have pain than it is ok to stop all together.  Plan to wean off of opioids This plan should  start within one week post op of your joint replacement. Maintain the same interval or time between taking each dose and first decrease the dose.  Cut the total daily intake of opioids by one tablet each day Next start to increase the time between doses. The last dose that should be eliminated is the evening dose.   MAKE SURE YOU:  Understand these instructions.  Get help right away if you are not doing well or get worse.    Thank you for letting us be a part of your medical care team.  It is a privilege we respect greatly.  We hope these instructions will help you stay on track for a fast and full recovery!

## 2023-09-17 NOTE — Interval H&P Note (Signed)
 The patient has been re-examined, and the chart reviewed, and there have been no interval changes to the documented history and physical.    Plan for Left medial UKA for left knee medial compartment OA  The operative side was examined and the patient was confirmed to have sensation to DPN, SPN, TN intact, Motor EHL, ext, flex 5/5, and DP 2+, PT 2+, No significant edema.   The risks, benefits, and alternatives have been discussed at length with patient, and the patient is willing to proceed.  Left knee marked. Consent has been signed.

## 2023-09-17 NOTE — Op Note (Signed)
 09/17/2023  10:02 AM  PATIENT:  Mario [name]    PRE-OPERATIVE DIAGNOSIS: End-stage left knee medial compartment osteoarthritis  POST-OPERATIVE DIAGNOSIS:  Same  PROCEDURE: Left medial unicompartmental Knee Arthroplasty  SURGEON:  Laquida Cotrell A Sanah Kraska, MD  PHYSICIAN ASSISTANT: Bernarda Mclean, PA-C, present and scrubbed throughout the case, critical for completion in a timely fashion, and for retraction, instrumentation, and closure.  ANESTHESIA:   Spinal  ESTIMATED BLOOD LOSS: 50cc   PREOPERATIVE INDICATIONS:  Mario [name] is a  83 y.o. male with a diagnosis of OA LEFT KNEE who failed conservative measures and elected for surgical management.    The risks benefits and alternatives were discussed with the patient preoperatively including but not limited to the risks of infection, bleeding, nerve injury, cardiopulmonary complications, blood clots, the need for revision surgery, among others, and the patient was willing to proceed.  OPERATIVE IMPLANTS: [employer] PPK fixed bearing medial compartment arthroplasty femur size 5, tibia size F, bearing size 8mm.  OPERATIVE FINDINGS: Endstage grade 4 anteromedial compartment osteoarthritis. No significant changes in the lateral compartment, and mild in the patellofemoral joint.  The ACL was intact.  OPERATIVE PROCEDURE:   Once adequate anesthesia, preoperative antibiotics, 2 gm of ancef ,1 gm of Tranexamic Acid , and 8 mg of Decadron  administered, the patient was positioned supine with a left thigh tourniquet placed.  The left lower extremity was prepped and draped in sterile fashion.  A time-  out was performed identifying the patient, planned procedure, and the appropriate extremity.   The leg was elevated and exsanguinated and the tourniquet was inflated. Anterior midline incision was performed. Medial Parapatellar incision was carried out, and the osteophytes were excised, along with the medial plateau and femoral condyle. Resected anterior  horn of medial meniscus and a small portion of the fat pad. Medial release was performed.  Remainder of the knee was evaluated.  ACL was intact lateral compartment with no significant wear.  Patellofemoral compartment with mild wear.  The extra medullary tibial cutting jig was applied, and resection was performed measuring 4 mm from the anterior medial defect.  Cut was made perpendicular to the axis of the tibia, matching the patient's native slope, and sagittal cut was made with appropriate rotation and just medial to the ACL insertion.    The proximal tibial bony cut was removed in one piece, and I turned my attention to the femur.  Rasp was used to clear debris from the corner of the cut.  We next turned our attention to the femur.  Distal femoral cutting block was put in place and pinned and the distal femur was cut.  The cutting guide was removed and the cut was completed with the knee in flexion.  We then sized the femur to be a 5 and then pinned the femur cutting guide into place taking care to appropriately lateralized and not overhang the component, medially or anteriorly.  The femur was drilled and the posterior and chamfer cuts were made.  With the knee in flexion medial meniscectomy was performed.  We next size of the tibia and found it to be a size F.  The tibial trial was then impacted into place in the right position pinned and the lugs were drilled.  The trial femoral component was then impacted, and a size 8mm liner trial was inserted.  With the trial liner in place we used the Amber sticks and had approximately 2 mm of laxity in extension and 2 to 3 mm of laxity in flexion.  The femur also tracked appropriately on the center of the tibial component through flexion.   I then cemented the components into place, cementing the tibia first, removing all excess cement, and then cementing the femur.  All loose cement was removed.  The trial liner was again inserted until cement had  completely set.  The wounds were thoroughly irrigated with normal saline pulse lavage and injected with 20cc Exparel  diluted with 30cc 0.25% marcaine  with epi.  Once the cement was set again we assessed stability and balance which we felt was appropriate with a size 8mm liner.  The real polyethylene liner was opened and inserted.   The tourniquet was let down  No significant   hemostasis was required.  The medial parapatellar arthrotomy was then reapproximated using  #1 Stratafix sutures with the knee  in flexion.  The   remaining wound was closed with 0 stratafix, 2-0 Vicryl, and running 3-0 Monocryl.   The knee was cleaned, dried, dressed sterilely using Dermabond and   Aquacel dressing.  The patient was then  brought to recovery room in stable condition, tolerating the procedure  well. There were no complications.   Post op recs: WB: WBAT Abx: ancef  periop Imaging: PACU xrays DVT prophylaxis: Resume Eliquis  2.5 mg postop day 1 and 2 and 5 mg postop day 3 Follow up: 2 weeks after surgery for a wound check with Dr. Edna at [facility].  Address: [address], [place], [place] [address]  Office Phone: [phone]  Toribio Edna, MD Orthopaedic Surgery

## 2023-09-17 NOTE — Anesthesia Procedure Notes (Signed)
 Anesthesia Regional Block: Adductor canal block   Pre-Anesthetic Checklist: , timeout performed,  Correct Patient, Correct Site, Correct Laterality,  Correct Procedure, Correct Position, site marked,  Risks and benefits discussed,  Surgical consent,  Pre-op  evaluation,  At surgeon's request and post-op pain management  Laterality: Left  Prep: chloraprep       Needles:  Injection technique: Single-shot  Needle Type: Echogenic Stimulator Needle     Needle Length: 9cm  Needle Gauge: 21     Additional Needles:   Procedures:,,,, ultrasound used (permanent image in chart),,    Narrative:  Start time: 09/17/2023 8:10 AM End time: 09/17/2023 8:15 AM Injection made incrementally with aspirations every 5 mL.  Performed by: Personally  Anesthesiologist: Tilford Franky BIRCH, MD  Additional Notes: Discussed risks and benefits of the nerve block in detail, including but not limited vascular injury, permanent nerve damage and infection.   Patient tolerated the procedure well. Local anesthetic introduced in an incremental fashion under minimal resistance after negative aspirations. No paresthesias were elicited. After completion of the procedure, no acute issues were identified and patient continued to be monitored by RN.

## 2023-09-17 NOTE — Anesthesia Postprocedure Evaluation (Signed)
 Anesthesia Post Note  Patient: Mario [name]  Procedure(s) Performed: ARTHROPLASTY, KNEE, UNICOMPARTMENTAL (Left: Knee)     Patient location during evaluation: PACU Anesthesia Type: Spinal Level of consciousness: oriented and awake and alert Pain management: pain level controlled Vital Signs Assessment: post-procedure vital signs reviewed and stable Respiratory status: spontaneous breathing, respiratory function stable and patient connected to nasal cannula oxygen  Cardiovascular status: blood pressure returned to baseline and stable Postop Assessment: no headache, no backache and no apparent nausea or vomiting Anesthetic complications: no   No notable events documented.  Last Vitals:  Vitals:   09/17/23 1145 09/17/23 1158  BP: 129/72   Pulse: 70 70  Resp: 12 12  Temp:  (!) 36.1 C  SpO2: 91% 93%    Last Pain:  Vitals:   09/17/23 1130  TempSrc:   PainSc: 0-No pain                 [name]

## 2023-09-17 NOTE — Transfer of Care (Signed)
 Immediate Anesthesia Transfer of Care Note  Patient: Mario [name]  Procedure(s) Performed: ARTHROPLASTY, KNEE, UNICOMPARTMENTAL (Left: Knee)  Patient Location: PACU  Anesthesia Type:Spinal  Level of Consciousness: awake, alert , and oriented  Airway & Oxygen  Therapy: Patient Spontanous Breathing and Patient connected to nasal cannula oxygen   Post-op Assessment: Report given to RN and Post -op Vital signs reviewed and stable  Post vital signs: Reviewed and stable  Last Vitals:  Vitals Value Taken Time  BP 124/65 09/17/23 10:25  Temp    Pulse 148 09/17/23 10:30  Resp 15 09/17/23 10:30  SpO2 99 % 09/17/23 10:30  Vitals shown include unfiled device data.  Last Pain:  Vitals:   09/17/23 0700  TempSrc: Oral  PainSc:          Complications: No notable events documented.

## 2023-09-17 NOTE — Evaluation (Signed)
 Physical Therapy Evaluation Patient Details Name: Mario [name] MRN: [id number] DOB: [birth year] Today's Date: 09/17/2023  History of Present Illness  83 yo male presents to therapy s/p L medial UKA on 09/17/2023 due to failure of conservative measures. Pt PMH includes but is not limited to: a-flutter, prostate ca, angina, OSA on CPAP, DJD of cervical region, diverticulitis, nephrolithiasis, HTN, B HOH, cardioversion and ablation, B elbow tendon repair, and R reverse TSA (2024).  Clinical Impression     Mario [name] is a 83 y.o. male POD 0 s/p L partial knee replacement. Patient reports IND with mobility at baseline. Patient is now limited by functional impairments (see PT problem list below) and requires CGA and cues for transfers and gait with RW. Patient was able to ambulate 65 feet with RW and CGA to close S and cues for safe walker management. Patient educated on safe sequencing for stair mobility with R handrail, fall risk prevention, use of ice man machine, pain management and goal, and car transfers pt and spouse verbalized understanding of safe guarding position for people assisting with mobility. Patient instructed in exercises to facilitate ROM and circulation reviewed and HO provided. Patient will benefit from continued skilled PT interventions to address impairments and progress towards PLOF. Patient has met mobility goals at adequate level for discharge home with family support and OPPT services scheduled for 8/8; will continue to follow if pt continues acute stay to progress towards Mod I goals.       If plan is discharge home, recommend the following: A little help with walking and/or transfers;A little help with bathing/dressing/bathroom;Assistance with cooking/housework;Assist for transportation;Help with stairs or ramp for entrance   Can travel by private vehicle        Equipment Recommendations None recommended by PT  Recommendations for Other Services       Functional Status  Assessment Patient has had a recent decline in their functional status and demonstrates the ability to make significant improvements in function in a reasonable and predictable amount of time.     Precautions / Restrictions Precautions Precautions: Knee;Fall Restrictions Weight Bearing Restrictions Per Provider Order: No      Mobility  Bed Mobility Overal bed mobility: Needs Assistance Bed Mobility: Supine to Sit     Supine to sit: Supervision     General bed mobility comments: min cues    Transfers Overall transfer level: Needs assistance Equipment used: Rolling walker (2 wheels) Transfers: Sit to/from Stand Sit to Stand: Contact guard assist           General transfer comment: min cues    Ambulation/Gait Ambulation/Gait assistance: Contact guard assist Gait Distance (Feet): 65 Feet Assistive device: Rolling walker (2 wheels) Gait Pattern/deviations: Step-to pattern, Decreased stance time - left, Antalgic, Trunk flexed Gait velocity: decreased     General Gait Details: slight trunk flexion with B UE support at RW to offload L LE, no reports of increased pain with mobility tasks, min cues for safety, posture and Rw management  Stairs Stairs: Yes Stairs assistance: Contact guard assist Stair Management: One rail Right, Two rails Number of Stairs: 2 General stair comments: step navigation initiated with B handrail with min cues for proper technique, sequencing and step to pattern pt able to progress to steps with R handrail only and CGA  Wheelchair Mobility     Tilt Bed    Modified Rankin (Stroke Patients Only)       Balance Overall balance assessment: Needs assistance Sitting-balance support: Feet supported Sitting balance-Leahy  Scale: Good     Standing balance support: Bilateral upper extremity supported, During functional activity, Reliant on assistive device for balance Standing balance-Leahy Scale: Fair Standing balance comment: static standing  no UE support                             Pertinent Vitals/Pain Pain Assessment Pain Assessment: 0-10 Pain Score: 0-No pain Pain Location: L knee and LE Pain Intervention(s): Limited activity within patient's tolerance, Monitored during session, Premedicated before session, Repositioned, Ice applied    Home Living Family/patient expects to be discharged to:: Private residence Living Arrangements: Spouse/significant other Available Help at Discharge: Family Type of Home: House Home Access: Stairs to enter Entrance Stairs-Rails: Right Entrance Stairs-Number of Steps: 6   Home Layout: Two level;Able to live on main level with bedroom/bathroom Home Equipment: Rolling Walker (2 wheels);Shower seat - built in;BSC/3in1;Grab bars - tub/shower      Prior Function Prior Level of Function : Independent/Modified Independent;Driving             Mobility Comments: IND no AD for all ADLs, self care tasks and IADLs       Extremity/Trunk Assessment        Lower Extremity Assessment Lower Extremity Assessment: LLE deficits/detail LLE Deficits / Details: ankle DF/PF 5/5; SLR < 10 degree lag LLE Sensation: decreased light touch    Cervical / Trunk Assessment Cervical / Trunk Assessment: Normal  Communication   Communication Communication: Impaired Factors Affecting Communication: Hearing impaired    Cognition Arousal: Alert Behavior During Therapy: WFL for tasks assessed/performed   PT - Cognitive impairments: No apparent impairments                         Following commands: Intact       Cueing       General Comments      Exercises Total Joint Exercises Ankle Circles/Pumps: AROM, Both, 10 reps Quad Sets: AROM, Left, 5 reps Short Arc Quad: AROM, Left, 5 reps Heel Slides: AROM, Left, 5 reps Hip ABduction/ADduction: AROM, Left, 5 reps Straight Leg Raises: AROM, Left, 5 reps Knee Flexion: AROM, Left, 5 reps, Seated   Assessment/Plan     PT Assessment Patient needs continued PT services  PT Problem List Decreased strength;Decreased range of motion;Decreased activity tolerance;Decreased balance;Decreased mobility;Decreased coordination;Pain       PT Treatment Interventions DME instruction;Gait training;Stair training;Functional mobility training;Therapeutic activities;Therapeutic exercise;Balance training;Neuromuscular re-education;Patient/family education;Modalities    PT Goals (Current goals can be found in the Care Plan section)  Acute Rehab PT Goals Patient Stated Goal: to be able to get back in the gym PT Goal Formulation: With patient Time For Goal Achievement: 10/01/23 Potential to Achieve Goals: Good    Frequency 7X/week     Co-evaluation               AM-PAC PT 6 Clicks Mobility  Outcome Measure Help needed turning from your back to your side while in a flat bed without using bedrails?: None Help needed moving from lying on your back to sitting on the side of a flat bed without using bedrails?: A Little Help needed moving to and from a bed to a chair (including a wheelchair)?: A Little Help needed standing up from a chair using your arms (e.g., wheelchair or bedside chair)?: A Little Help needed to walk in hospital room?: A Little Help needed climbing 3-5 steps with a railing? :  A Little 6 Click Score: 19    End of Session Equipment Utilized During Treatment: Gait belt Activity Tolerance: Patient tolerated treatment well;No increased pain Patient left: in chair;with call bell/phone within reach;with family/visitor present Nurse Communication: Mobility status;Other (comment) (pt readiness for d/c from therapy standpoint) PT Visit Diagnosis: Unsteadiness on feet (R26.81);Other abnormalities of gait and mobility (R26.89);Muscle weakness (generalized) (M62.81);Difficulty in walking, not elsewhere classified (R26.2);Pain Pain - Right/Left: Left Pain - part of body: Knee;Leg    Time: 8672-8592 PT  Time Calculation (min) (ACUTE ONLY): 40 min   Charges:   PT Evaluation $PT Eval Low Complexity: 1 Low PT Treatments $Gait Training: 8-22 mins $Therapeutic Exercise: 8-22 mins PT General Charges $$ ACUTE PT VISIT: 1 Visit         [place], PT Acute Rehab   [place] VEAR Drone 09/17/2023, 2:21 PM

## 2023-09-17 NOTE — Anesthesia Procedure Notes (Signed)
 Spinal  Start time: 09/17/2023 8:35 AM End time: 09/17/2023 8:38 AM Reason for block: surgical anesthesia Staffing Performed: anesthesiologist  Anesthesiologist: Tilford Franky BIRCH, MD Performed by: Tilford Franky BIRCH, MD Authorized by: Tilford Franky BIRCH, MD   Preanesthetic Checklist Completed: patient identified, IV checked, site marked, risks and benefits discussed, surgical consent, monitors and equipment checked, pre-op  evaluation and timeout performed Spinal Block Patient position: sitting Prep: DuraPrep and site prepped and draped Location: L3-4 Injection technique: single-shot Needle Needle type: Pencan  Needle gauge: 24 G Needle length: 10 cm Needle insertion depth: 10 cm Assessment Events: CSF return Additional Notes Patient tolerated well. No immediate complications.  Functioning IV was confirmed and monitors were applied. Sterile prep and drape, including hand hygiene and sterile gloves were used. The patient was positioned and the back was prepped. The skin was anesthetized with lidocaine . Free flow of clear CSF was obtained prior to injecting local anesthetic into the CSF. The spinal needle aspirated freely following injection. The needle was carefully withdrawn. The patient tolerated the procedure well.

## 2023-09-18 ENCOUNTER — Encounter (HOSPITAL_COMMUNITY): Payer: Self-pay | Admitting: Orthopedic Surgery

## 2023-10-01 ENCOUNTER — Other Ambulatory Visit: Payer: Self-pay | Admitting: General Practice

## 2023-10-03 ENCOUNTER — Telehealth: Payer: Self-pay

## 2023-10-03 MED ORDER — AMLODIPINE BESYLATE 10 MG PO TABS: 10.0000 mg | ORAL_TABLET | Freq: Every day | ORAL | 3 refills | Status: AC

## 2023-10-03 MED ORDER — CARVEDILOL 6.25 MG PO TABS
ORAL_TABLET | ORAL | 3 refills | Status: AC
Start: 2023-10-03 — End: ?

## 2023-10-03 NOTE — Telephone Encounter (Signed)
*  STAT* If patient is at the pharmacy, call can be transferred to refill team.   1. Which medications need to be refilled? (please list name of each medication and dose if known)   amLODipine  (NORVASC ) 10 MG tablet  carvedilol  (COREG ) 6.25 MG tablet   2. Would you like to learn more about the convenience, safety, & potential cost savings by using the [facility]?   3. Are you open to using the [facility] (Type [facility]. ).  4. Which pharmacy/location (including street and city if local pharmacy) is medication to be sent to?  [id number] - [place], [place] - [address]   5. Do they need a 30 day or 90 day supply?   90 day  Patient stated he is completely out of these medications.

## 2023-10-03 NOTE — Telephone Encounter (Signed)
 Pt's medications were sent to pt's pharmacy as requested. Confirmation received.

## 2023-10-03 NOTE — Addendum Note (Signed)
 Addended by: BLUFORD RAMP D on: 10/03/2023 03:30 PM   Modules accepted: Orders

## 2023-10-08 ENCOUNTER — Ambulatory Visit (HOSPITAL_COMMUNITY)
Admission: RE | Admit: 2023-10-08 | Discharge: 2023-10-08 | Disposition: A | Discharge status: Home / Self Care | Source: Ambulatory Visit | Attending: Otolaryngology | Admitting: Otolaryngology

## 2023-10-08 DIAGNOSIS — J329 Chronic sinusitis, unspecified: Secondary | ICD-10-CM | POA: Insufficient documentation

## 2023-10-15 ENCOUNTER — Other Ambulatory Visit (HOSPITAL_COMMUNITY): Payer: Self-pay | Admitting: Urology

## 2023-10-15 ENCOUNTER — Other Ambulatory Visit: Payer: Self-pay | Admitting: Urology

## 2023-10-15 DIAGNOSIS — C61 Malignant neoplasm of prostate: Secondary | ICD-10-CM

## 2023-10-15 DIAGNOSIS — D3500 Benign neoplasm of unspecified adrenal gland: Secondary | ICD-10-CM

## 2023-10-20 ENCOUNTER — Ambulatory Visit (INDEPENDENT_AMBULATORY_CARE_PROVIDER_SITE_OTHER): Payer: Self-pay

## 2023-10-20 NOTE — Telephone Encounter (Signed)
 Spoke with patient regarding scan results. Patient understood.

## 2023-10-21 ENCOUNTER — Encounter (HOSPITAL_COMMUNITY)
Admission: RE | Admit: 2023-10-21 | Discharge: 2023-10-21 | Disposition: A | Discharge status: Home / Self Care | Source: Ambulatory Visit | Attending: Urology | Admitting: Urology

## 2023-10-21 DIAGNOSIS — C61 Malignant neoplasm of prostate: Secondary | ICD-10-CM | POA: Diagnosis present

## 2023-10-21 MED ORDER — FLOTUFOLASTAT F 18 GALLIUM 296-5846 MBQ/ML IV SOLN
8.9000 | Freq: Once | INTRAVENOUS | Status: AC
  Administered 2023-10-21: 8.9 via INTRAVENOUS

## 2023-10-29 ENCOUNTER — Ambulatory Visit (INDEPENDENT_AMBULATORY_CARE_PROVIDER_SITE_OTHER): Admitting: Otolaryngology

## 2023-11-04 ENCOUNTER — Encounter (INDEPENDENT_AMBULATORY_CARE_PROVIDER_SITE_OTHER): Payer: Self-pay | Admitting: Otolaryngology

## 2023-11-04 ENCOUNTER — Ambulatory Visit (INDEPENDENT_AMBULATORY_CARE_PROVIDER_SITE_OTHER): Admitting: Otolaryngology

## 2023-11-04 ENCOUNTER — Telehealth (INDEPENDENT_AMBULATORY_CARE_PROVIDER_SITE_OTHER): Payer: Self-pay

## 2023-11-04 VITALS — BP 122/58 | HR 77 | Temp 98.6°F

## 2023-11-04 DIAGNOSIS — J3489 Other specified disorders of nose and nasal sinuses: Secondary | ICD-10-CM

## 2023-11-04 DIAGNOSIS — R0982 Postnasal drip: Secondary | ICD-10-CM

## 2023-11-04 DIAGNOSIS — R0981 Nasal congestion: Secondary | ICD-10-CM

## 2023-11-04 DIAGNOSIS — J329 Chronic sinusitis, unspecified: Secondary | ICD-10-CM

## 2023-11-04 DIAGNOSIS — J3089 Other allergic rhinitis: Secondary | ICD-10-CM

## 2023-11-04 DIAGNOSIS — Z87891 Personal history of nicotine dependence: Secondary | ICD-10-CM | POA: Diagnosis not present

## 2023-11-04 DIAGNOSIS — J342 Deviated nasal septum: Secondary | ICD-10-CM

## 2023-11-04 MED ORDER — BUDESONIDE 0.5 MG/2ML IN SUSP: 0.5000 mg | Freq: Two times a day (BID) | RESPIRATORY_TRACT | 12 refills | Status: DC

## 2023-11-04 NOTE — Progress Notes (Signed)
 ENT Progress Note:   Update 11/04/2023  Discussed the use of AI scribe software for clinical note transcription with the patient, who gave verbal consent to proceed.  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  He has experienced chronic sinus issues for most of his life, with symptoms worsening over time. He describes his sinuses as being filled with 'stuff' and sometimes experiences complete blockage on both sides, making it difficult to breathe. He uses a salt water  rinse, which helps loosen the congestion, improving his breathing and sleep. Additionally, he uses Flonase  and a prescribed nasal spray, both of which have been beneficial.  He has a history of prostate cancer treated with radiation therapy two to three years ago, with recent hematuria and is scheduled for additional procedures/imaging. PET/CT was negative for cancer recurrence.    Records Reviewed:  Initial Evaluation  Reason for Consult: chronic nasal congestion and nasal obstruction    HPI: Discussed the use of AI scribe software for clinical note transcription with the patient, who gave verbal consent to proceed.  History of Present Illness Mario [name] is an 83 year old male with hx of nasal surgery in his 20's, environmental allergies, who presents with dental pain and chronic nasal congestion. He was referred by his dentist for evaluation of sinus fullness noted on imaging.  He has been experiencing significant dental pain, particularly when eating, which led to a visit to his dentist two months ago. A panoramic dental X-ray revealed opacification in the left-sided sinuses, according to the dentist.  He has a longstanding history of sinus problems and chronic nasal congestion, which have been present for most of his life. 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 He was  unaware of the perforation until later, and his sinus issues have persisted since the surgery.  Currently, he is experiencing nasal congestion and is recovering from a recent episode of bronchitis. No smoking history is noted, and he does not have any dental implants.  He has been using Flonase  nasal spray nightly. He recently stopped using other nasal sprays and allergy medications as of last Thursday or Friday.    Records Reviewed:  PCP office visit 09/01/23 Mario [name] is an 83 year old male with atrial fibrillation who presents with difficulty breathing following chemical inhalation.   He began experiencing difficulty breathing a week ago after inhaling chemicals while working on a tractor. Initially, he sought care at [facility] in [place], where he was prescribed prednisone  and albuterol . Despite this treatment, his symptoms worsened, prompting a visit to urgent care on Saturday, where he was prescribed a Z-Pak, cough medicine, and nose spray. He notes some improvement in symptoms since starting the Z-Pak.   Although his breathing has improved starting today, he still feels 'tight and wheezy'.   He experienced a low-grade fever of 99.50F and has been coughing up dark brown sputum, which has since lightened to gray and is now mostly clear. He also reports significant nasal congestion and sinus pressure, which he describes as 'bad'. He has been using the prescribed nose spray and notes that it helps clear his nasal passages temporarily.   He has completed a five-day course of prednisone  and reports feeling jittery and uncomfortable while on it. He continues to use his albuterol  inhaler, which provides some relief.  Dx with bronchitis and given Wixela inhaler Rx   Past  Medical History:  Diagnosis Date   Anticoagulant long-term use    eliquis --- managed by cardiology   Arthritis    neck   Benign localized prostatic hyperplasia with lower urinary tract symptoms (LUTS)    Blister     06-25-2021  per pt has a small blister at end of tailbone from doing sit-up's, covers w/ band-aid   ED (erectile dysfunction)    First degree heart block    History of adenomatous polyp of colon    History of basal cell carcinoma (BCC) of skin    excision multiple area's   History of kidney stones    History of malignant melanoma of back 2000   mid back  s/p WLE 12/ 2011 stage 1, localized per pt and no recurrence   History of narrow angle glaucoma    per pt s/p laser both eyes no issues since approx 2006   History of squamous cell carcinoma of skin    excision multiple area's   Hypertension    Hypogonadism in male    Malignant neoplasm prostate (HCC) 03/2021   urologist--- dr bell/  radiation oncology-- dr patrcia;  dx 02/ 2077m  Gleason 3+4   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    per cardiac CT 02-24-2020   42mm   OSA on CPAP    per pt uses nightly   Persistent atrial fibrillation [facility])    cardiologist--- dr oneida. burnard;   DCCV 02-22-2020 and 12-01-2014;  03-02-2020  s/p afib ablation by Dr Kelsie   Pneumonia    Seasonal allergies    Wears hearing aid in both ears     Past Surgical History:  Procedure Laterality Date   A-FLUTTER ABLATION N/A 01/17/2022   Procedure: A-FLUTTER ABLATION;  Surgeon: Nancey Eulas BRAVO, MD;  Location: [facility] INVASIVE CV LAB;  Service: Cardiovascular;  Laterality: N/A;   ATRIAL FIBRILLATION ABLATION N/A 03/02/2020   Procedure: ATRIAL FIBRILLATION ABLATION;  Surgeon: Kelsie Agent, MD;  Location: [facility] INVASIVE CV LAB;  Service: Cardiovascular;  Laterality: N/A;   CARDIOVERSION N/A 12/01/2014   Procedure: CARDIOVERSION;  Surgeon: Debby DELENA burnard, MD;  Location: [facility] ENDOSCOPY;  Service: Cardiovascular;  Laterality: N/A;   CARDIOVERSION N/A 02/22/2020   Procedure: CARDIOVERSION;  Surgeon: Lonni Slain, MD;  Location: [facility] ENDOSCOPY;  Service: Cardiovascular;  Laterality: N/A;   CATARACT EXTRACTION W/ INTRAOCULAR LENS IMPLANT Bilateral    2000; 2004    COLONOSCOPY WITH ESOPHAGOGASTRODUODENOSCOPY (EGD)  09/2019   CYSTOSCOPY W/ URETEROSCOPY W/ LITHOTRIPSY  05/12/2003   @[facility]    ELBOW SURGERY Left 1984   tendon repair left arm   GOLD SEED IMPLANT N/A 06/27/2021   Procedure: GOLD SEED IMPLANT;  Surgeon: Carolee Sherwood JONETTA DOUGLAS, MD;  Location: [facility];  Service: Urology;  Laterality: N/A;   INCISION AND DRAINAGE OF WOUND Left 08/28/2016   Procedure: IRRIGATION AND DEBRIDEMENT WOUND;  Surgeon: Shari Easter, MD;  Location: [facility] OR;  Service: Orthopedics;  Laterality: Left;   LIPOMA EXCISION Right 04/18/2010   right thigh   MASS EXCISION Left 07/2001   excision left axila mass (benign)   MELANOMA EXCISION  01/2000   @ [facility];   WLE of mid back for melanoma and excision resection lipoma upper back   METACARPAL OSTEOTOMY Left 08/28/2016   Procedure: 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.;  Surgeon: Shari Easter, MD;  Location: [facility] OR;  Service: Orthopedics;  Laterality: Left;   MICROLARYNGOSCOPY WITH LASER N/A 01/25/2015   Procedure: MICROLARYNGOSCOPY ;  Surgeon: Vaughan Ricker, MD;  Location: [facility] OR;  Service: ENT;  Laterality: N/A;  Microlaryngoscopy with 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 with CO2 laser    PARTIAL KNEE ARTHROPLASTY Left 09/17/2023   Procedure: ARTHROPLASTY, KNEE, UNICOMPARTMENTAL;  Surgeon: Edna Toribio LABOR, MD;  Location: [facility] ORS;  Service: Orthopedics;  Laterality: Left;   REVERSE SHOULDER ARTHROPLASTY Right 12/05/2022   Procedure: REVERSE SHOULDER ARTHROPLASTY;  Surgeon: Cristy Bonner DASEN, MD;  Location: [facility];  Service: Orthopedics;  Laterality: Right;   SATURATION BIOPSY OF PROSTATE  01/18/2005   @[facility]   w/ anesthesia   SPACE OAR INSTILLATION N/A 06/27/2021   Procedure: SPACE OAR INSTILLATION;  Surgeon: Carolee Sherwood JONETTA DOUGLAS, MD;  Location: [facility];  Service: Urology;  Laterality: N/A;   TENDON REPAIR Right 2012   right  involving elbow, arm. hand   TONSILLECTOMY  1970     Family History  Problem Relation Age of Onset   Heart disease Mother    Stroke Father    Other Father        brain tumor-benign   Heart disease Sister    Heart disease Sister    Hypertension Sister    Colon cancer Neg Hx    Stomach cancer Neg Hx    Esophageal cancer Neg Hx    Pancreatic cancer Neg Hx    Liver disease Neg Hx     Social History:  reports that he quit smoking about 39 years ago. His smoking use included cigarettes. He started smoking about 46 years ago. He has never used smokeless tobacco. He reports that he does not currently use alcohol. He reports that he does not use drugs.  Allergies:  Allergies  Allergen Reactions   Contrast Media [Iodinated Contrast Media] Shortness Of Breath, Rash and Other (See Comments)    First noted after Coronary CTA 02/24/20. Pt experienced wheezing, shortness of breath and diffuse rash/welts over face and trunk.   Bee Venom Other (See Comments)    Becomes light headed; faint. Has to take benadryl     Lisinopril Other (See Comments)    REACTION: severe GI upset requiring EGD--temporally related to lisinopril--tolerated micardis    Medications: I have reviewed the patient's current medications.  The PMH, PSH, Medications, Allergies, and SH were reviewed and updated.  ROS: Constitutional: Negative for fever, weight loss and weight gain. Cardiovascular: Negative for chest pain and dyspnea on exertion. Respiratory: Is not experiencing shortness of breath at rest. Gastrointestinal: Negative for nausea and vomiting. Neurological: Negative for headaches. Psychiatric: The patient is not nervous/anxious  Blood pressure (!) 122/58, pulse 77, temperature 98.6 F (37 C), SpO2 95%. There is no height or weight on file to calculate BMI.  PHYSICAL EXAM:  Exam: General: Well-developed, well-nourished Respiratory Respiratory effort: Equal inspiration and expiration without stridor Cardiovascular Peripheral Vascular: Warm extremities  with equal color/perfusion Eyes: No nystagmus with equal extraocular motion bilaterally Neuro/Psych/Balance: Patient oriented to person, place, and time; Appropriate mood and affect; Gait is intact with no imbalance; Cranial nerves I-XII are intact Head and Face Inspection: Normocephalic and atraumatic without mass or lesion Palpation: Facial skeleton intact without bony stepoffs Salivary Glands: No mass or tenderness Facial Strength: Facial motility symmetric and full bilaterally ENT Pinna: External ear intact and fully developed Lips, Teeth, and gums: Mucosa and teeth intact and viable TMJ: No pain to palpation with full mobility Neck Neck and Trachea: Midline trachea without mass or lesion Thyroid : No mass or nodularity Lymphatics: No lymphadenopathy   Studies Reviewed: CXR 08/29/23  FINDINGS: No consolidation, pneumothorax or effusion. No edema. Normal cardiopericardial silhouette. Calcified aorta. Degenerative changes along the spine. Right shoulder reverse arthroplasty identified at the edge of the imaging field.   IMPRESSION: No acute cardiopulmonary disease.  CT max/face 10/08/23 IMPRESSION: 1. Moderate opacification of the left frontal sinus with expansion and bulging/thinning of the outer table cortex. 2. Near complete opacification of the ethmoid air cells. 3. Circumferential mucosal disease within the left maxillary sinus with radiodense secretions and within the floor of the right maxillary sinus. 4. Opacification of the frontal ethmoidal recesses, occlusion of the ostiomeatal complexes, and occlusion of the superior and middle meatuses. 5. Findings consistent with chronic sinusitis.  Assessment/Plan: Encounter Diagnoses  Name Primary?   Chronic sinusitis, unspecified location Yes   Nasal obstruction    Chronic nasal congestion    Nasal septal deviation    Nasal septal perforation    Environmental and seasonal allergies    Post-nasal drip      Assessment  and Plan Assessment & Plan Chronic left-sided sinus congestion on dental films Chronic nasal congestion and sx of difficulty breathing through his nose. No purulence or polyps noted on nasal endoscopy but left sided middle turbinate had severe edema and erythema, he also had a large posterior septal perforation - Order non-contrasted sinus CT to evaluate extent of sinus inflammation - Continue Flonase  nasal spray nightly. - Prescribed Xyzal   5 mg at night. - Instructed on saline nasal rinses. RTC after imaging   Update 11/04/23  Chronic sinusitis Chronic sinusitis with nasal obstruction due to chronic inflammation. CT shows maxillary, ethmoid and frontal sinus involvement. We discussed surgery and he is interested but would like to complete workup of hematuria and heal after his recent knee surgery. Discussed benefits and risks of surgery.  - Continue saline nasal rinses. We prescribed budesonide  to add to the rinses - Schedule follow-up in four months to reassess symptoms and discuss potential surgery.      Thank you for allowing me to participate in the care of this patient. Please do not hesitate to contact me with any questions or concerns.   Elena Larry, MD Otolaryngology [facility] Phone: [phone] Fax: [phone]    11/04/2023, 3:23 PM

## 2023-11-04 NOTE — Progress Notes (Signed)
 Patient explains that he is on meds for AFIB.

## 2023-11-04 NOTE — Patient Instructions (Signed)
 Sinus Surgery Post-Operative Care Instructions  What are the sinuses?  The sinuses are air filled cavities (or holes) in the skull that are located adjacent to the nose. The tissue (mucosa) that lines these cavities and the nose swells and secretes mucus in response to infection or environmental irritants. Normally, the mucus produced by the sinuses drains into the nose and is, then, either swallowed or coughed up. With infection, the swelling of the sinus mucosa can make drainage difficult, leading to chronic, recurrent infections.  The triad of nasal congestion, facial discomfort and discolored nasal drainage most frequently defines chronic sinusitis.  There are four pairs of sinuses. (See figure below) 1) Maxillary Sinuses (cheek sinuses) 2) Ethmoid Sinuses (the sinuses located between the eyes)  3) Sphenoid Sinuses (the sinuses located behind the nose) 4) Frontal Sinuses (the sinuses located above the eyes)    How is sinusitis treated?  Antibiotics, steroids, nasal sprays, and decongestants are often successful in treating short-term bouts of sinusitis. When medications fail to provide adequate relief from sinusitis, surgery must be considered.  What is sinus surgery?  1) The goal of sinus surgery is to enlarge the natural openings of the sinuses into the nose. Enlarging these openings makes it easier for the sinuses to drain, even when swollen from infection or environmental irritants. Sinus surgery is also used to remove nasal polyps, nasal masses, and, sometimes, to straighten the nasal septum.  2) Using small cameras with lights on the end (endoscopes) the surgery is performed through the nose, without the need for any external incisions. In addition to the use of endoscopes, special instruments have been designed to perform the task of removing thickened and diseased tissue from the opening of these sinuses.  3) Sinus surgery is generally an outpatient procedure, lasting from one to four  hours. Although nasal packing is no longer common, "spacers" are used to aid proper healing of the sinus mucosa. The spacers will be removed during your first post-operative appointment.   4) Recovery time varies from patient to patient, but, in general, usually lasts between one to two weeks. Initially, patients should expect to feel congested and some mild sinus pressure. As the sinuses slowly heal, this congestion and pressure will decrease.  5) It is important to remember that, while we perform the surgery, you play an active role in the success of its outcome. It is up to you to abide by the postoperative restrictions and implement the postoperative care instructions.   The Do's and Do Not's of postoperative sinus care:  DO: DO take the pain medication prescribed: ____________ every ___ hours as needed for pain. You may also use Tylenol for breakthrough pain or by itself (if it is sufficient to control your pain). DO take the antibiotics prescribed: ____________ - ___ times a day for ______ weeks/days. DO take the steroid prescribed: ____________ - daily as directed. Continue taking the steroid until you are instructed by your surgeon to stop. DO take live cultures while on the antibiotic: acidophilus/yogurt daily DO start your nasal irrigations the day after surgery.  These irrigations must be performed at least three times a day (more is preferable), however it does not hurt to perform them more frequently. This is essential to the healing process. It removes the crusts that form as the nasal tissue heals and prevents scarring within the nose. Please see the following page for specific instructions. DO cough and sneeze with your mouth open. DO eat a regular diet. DO take your pain  medication before your first postoperative appointment.  DO NOT: DO NOT perform any heavy lifting (nothing greater than 15lbs), bending, straining. DO NOT blow your nose or pick at your nose for at least 2  weeks DO NOT take aspirin or aspirin containing medications (Advil, Motrin, or any other NSAIDS) DO NOT fly without your doctor's clearance for 3-5 days after surgery DO NOT stop your prednisone until directed to do so by your surgeon.  Call Your Doctor Immediately If: Change in vision Increased swelling around the eyes Neck stiffness or deep head pain Continued Nausea or Vomiting Bright red blood that lasts more than ten minutes or causes choking Fever over 101 degrees     NASAL/SINUS IRRIGATIONS  It is required that you wash out your nose and sinus cavities with a saline solution. This is good in the post-operative period to flush out pus, crusts, and debris. In the long-term, this is also used to mechanically wash out infections. You can use the recipe below to make the irrigation solution or the packets that come with the [facility] Sinus Rinse squeeze bottle (see [facility] box instructions).  RECIPE:  1 quart boiled or distilled H2O  1 teaspoon canning/pickling/kosher salt (non-iodized)  1 teaspoon baking soda  Irrigate each nostril with 4oz [facility] squeeze bottle contains 8oz) of the above solution at least three times daily. While in the shower or leaning over a sink, aim the squeeze bottle (see figure 2) diagonally (away from the septum). The fluid will circulate in and out of your sinus cavities, coming back out the opposite nostril being irrigated. To accomplish this focus on making a "k" sound while you irrigate. This will close your palate so the irrigation does not wash out your mouth. The irrigations help to clean the clots from your nose and prevent scarring after surgery.   To view a video demonstration, please go to [email or web address]. Once on their home page, click the link on the left side of the screen reading, "[employer] Videos."  It may be convenient to mix larger quantities of the saline solution and store it in your refrigerator, warming up each days supply prior to  use. Consider buying one gallon of distilled water and adding 4 tsp of salt and 4 tsp of baking soda.

## 2023-11-04 NOTE — Telephone Encounter (Signed)
 Spoke with pharmacy regarding medication directions.

## 2023-11-09 ENCOUNTER — Ambulatory Visit
Admission: RE | Admit: 2023-11-09 | Discharge: 2023-11-09 | Disposition: A | Discharge status: Home / Self Care | Source: Ambulatory Visit | Attending: Urology | Admitting: Urology

## 2023-11-09 DIAGNOSIS — D3500 Benign neoplasm of unspecified adrenal gland: Secondary | ICD-10-CM

## 2023-11-09 MED ORDER — GADOPICLENOL 0.5 MMOL/ML IV SOLN
9.0000 mL | Freq: Once | INTRAVENOUS | Status: AC | PRN
  Administered 2023-11-09: 9 mL via INTRAVENOUS

## 2023-12-10 ENCOUNTER — Other Ambulatory Visit: Payer: Self-pay | Admitting: Surgery

## 2023-12-10 DIAGNOSIS — I701 Atherosclerosis of renal artery: Secondary | ICD-10-CM

## 2023-12-22 ENCOUNTER — Other Ambulatory Visit: Payer: Self-pay

## 2023-12-24 MED ORDER — IRBESARTAN 75 MG PO TABS: 75.0000 mg | ORAL_TABLET | Freq: Every day | ORAL | 0 refills | Status: AC

## 2024-01-19 ENCOUNTER — Ambulatory Visit (HOSPITAL_COMMUNITY)
Admission: RE | Admit: 2024-01-19 | Discharge: 2024-01-19 | Disposition: A | Source: Ambulatory Visit | Attending: Surgery | Admitting: Surgery

## 2024-01-19 ENCOUNTER — Ambulatory Visit: Admitting: Surgery

## 2024-01-19 ENCOUNTER — Other Ambulatory Visit: Payer: Self-pay

## 2024-01-19 ENCOUNTER — Encounter: Payer: Self-pay | Admitting: Surgery

## 2024-01-19 VITALS — BP 139/84 | HR 52 | Temp 98.2°F | Ht 71.0 in | Wt 197.0 lb

## 2024-01-19 DIAGNOSIS — Z91041 Radiographic dye allergy status: Secondary | ICD-10-CM

## 2024-01-19 DIAGNOSIS — I722 Aneurysm of renal artery: Secondary | ICD-10-CM

## 2024-01-19 DIAGNOSIS — I701 Atherosclerosis of renal artery: Secondary | ICD-10-CM

## 2024-01-19 MED ORDER — PREDNISONE 50 MG PO TABS: ORAL_TABLET | ORAL | 0 refills | Status: AC

## 2024-01-19 MED ORDER — DIPHENHYDRAMINE HCL 50 MG PO CAPS: ORAL_CAPSULE | ORAL | 0 refills | Status: AC

## 2024-01-19 NOTE — Progress Notes (Signed)
 [facility]ist [facility]  Patient name: Mario [name] MRN: [id number] DOB: [birth year] Sex: male   REQUESTING PROVIDER:    Sherwood Edison   REASON FOR CONSULT:    Renal artery aneurysm  HISTORY OF PRESENT ILLNESS:   Mario [name] is a 83 y.o. 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  He does not have any abdominal pain patient has a history of prostate cancer.  He is anticoagulated for A-fib.  He is medically managed for hypertension.  He is a former smoker.  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    PAST MEDICAL HISTORY    Past Medical History:  Diagnosis Date   Anticoagulant long-term use    eliquis --- managed by cardiology   Arthritis    neck   Benign localized prostatic hyperplasia with lower urinary tract symptoms (LUTS)    Blister    06-25-2021  per pt has a small blister at end of tailbone from doing sit-up's, covers w/ band-aid   ED (erectile dysfunction)    First degree heart block    History of adenomatous polyp of colon    History of basal cell carcinoma (BCC) of skin    excision multiple area's   History of kidney stones    History of malignant melanoma of back 2000   mid back  s/p WLE 12/ 2011 stage 1, localized per pt and no recurrence   History of narrow angle glaucoma    per pt s/p laser both eyes no issues since approx 2006   History of squamous cell carcinoma of skin    excision multiple area's   Hypertension    Hypogonadism in male    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 burnard;   DCCV 02-22-2020 and 12-01-2014;  03-02-2020  s/p afib ablation by Dr Kelsie   Pneumonia    Seasonal  allergies    Wears hearing aid in both ears      FAMILY HISTORY   Family History  Problem Relation Age of Onset   Heart disease Mother    Stroke Father    Other Father        brain tumor-benign   Heart disease Sister    Heart disease Sister    Hypertension Sister    Colon cancer Neg Hx    Stomach cancer Neg Hx    Esophageal cancer Neg Hx    Pancreatic cancer Neg Hx    Liver disease Neg Hx     SOCIAL HISTORY:   Social History   Socioeconomic History   Marital status: Married    Spouse name: Not on file   Number of children: 2   Years of education: Not on file   Highest education level: Not on file  Occupational History   Occupation: retired  Tobacco Use   Smoking status: Former    Current packs/day: 0.00    Types: Cigarettes    Start date: 11/20/1976    Quit date: 1986    Years since quitting: 39.9   Smokeless tobacco: Never  Vaping Use   Vaping status: Never Used  Substance and Sexual Activity   Alcohol use: Not Currently   Drug use: Never   Sexual activity: Not on file  Other Topics Concern   Not on file  Social History Narrative   Lives in [place].  Retired theatre stage manager.  Owned a fitness center.  Weight lifter   Social Drivers of Corporate Investment Banker Strain: Not on file  Food Insecurity: No Food Insecurity (10/08/2021)   Hunger Vital Sign    Worried About Running Out of Food in the Last Year: Never true    Ran Out of Food in the Last Year: Never true  Transportation Needs: No Transportation Needs (10/08/2021)   PRAPARE - Administrator, Civil Service (Medical): No    Lack of Transportation (Non-Medical): No  Physical Activity: Not on file  Stress: Not on file  Social Connections: Not on file  Intimate Partner Violence: Not on file    ALLERGIES:    Allergies  Allergen Reactions   Contrast Media [Iodinated Contrast Media] Shortness Of Breath, Rash and Other (See Comments)    First noted after Coronary CTA 02/24/20. Pt  experienced wheezing, shortness of breath and diffuse rash/welts over face and trunk.   Bee Venom Other (See Comments)    Becomes light headed; faint. Has to take benadryl     Lisinopril Other (See Comments)    REACTION: severe GI upset requiring EGD--temporally related to lisinopril--tolerated micardis    CURRENT MEDICATIONS:    Current Outpatient Medications  Medication Sig Dispense Refill   albuterol  (VENTOLIN  HFA) 108 (90 Base) MCG/ACT inhaler Inhale 1-2 puffs into the lungs every 6 (six) hours as needed for wheezing or shortness of breath. (Patient taking differently: Inhale 2 puffs into the lungs every 6 (six) hours as needed for wheezing or shortness of breath.) 8.5 g 0   amLODipine  (NORVASC ) 10 MG tablet Take 1 tablet (10 mg total) by mouth daily. 90 tablet 3   apixaban  (ELIQUIS ) 5 MG TABS tablet Take 1 tablet (5 mg total) by mouth 2 (two) times daily. You are to resume your Eliquis  after surgery to help reduce your risk of getting a blood clot.  For the first two days after surgery, take a half a dose (2.5 mg Eliquis ) twice a day.  Then on the third day, resume your full dose (5 mg Eliquis ) twice a day.  Take your Eliquis  for a minimum of 4 weeks from a post-operative standpoint.  Then follow the direction of your regular prescribing provider.     azelastine  (ASTELIN ) 0.1 % nasal spray Place 1 spray into both nostrils 2 (two) times daily. Use in each nostril as directed (Patient taking differently: Place 1 spray into both nostrils 2 (two) times daily.) 30 mL 1   budesonide  (PULMICORT ) 0.5 MG/2ML nebulizer solution Take 2 mLs (0.5 mg total) by nebulization in the morning and at bedtime. MIX 1 VIAL WITH 250CC OF NORMAL SALINE FOR SINUS WASH DAILY AS NEEDED 120 mL 12   Carboxymethylcellulose Sodium (THERATEARS PF OP) Place 1 drop into both eyes daily as needed (dry eyes).     carvedilol  (COREG ) 6.25 MG tablet Take 1 tablet by mouth in the morning and at bedtime. 180 tablet 3   fluticasone   (FLONASE ) 50 MCG/ACT nasal spray Place 2 sprays into both nostrils daily. 16 g 6   fluticasone -salmeterol (WIXELA INHUB) 100-50 MCG/ACT AEPB Inhale 1 puff into the lungs 2 (two) times daily. (Patient taking differently: Inhale 1 puff into the lungs daily.) 1  each 0   GLUCOSAMINE-CHONDROITIN PO Take 2 tablets by mouth daily.     hydrALAZINE  (APRESOLINE ) 25 MG tablet Take 1 tablet by mouth twice daily 180 tablet 3   hydrochlorothiazide  (MICROZIDE ) 12.5 MG capsule Take 12.5 mg by mouth daily as needed (swelling/fluid).     irbesartan  (AVAPRO ) 75 MG tablet Take 1 tablet (75 mg total) by mouth daily. 90 tablet 0   levocetirizine (XYZAL  ALLERGY 24HR) 5 MG tablet Take 1 tablet (5 mg total) by mouth every evening. 30 tablet 3   Lutein 20 MG CAPS Take 20 mg by mouth daily.     METAMUCIL FIBER PO Take 1 Scoop by mouth 4 (four) times a week.     Multiple Vitamin (MULTIVITAMIN) tablet Take 1 tablet by mouth daily.     omeprazole  (PRILOSEC) 40 MG capsule Take 1 capsule (40 mg total) by mouth daily for 21 days. 21 capsule 0   polyethylene glycol (MIRALAX ) 17 g packet Take 17 g by mouth daily. 14 each 0   Saw Palmetto, Serenoa repens, (SAW PALMETTO PO) Take 2 tablets by mouth daily.     testosterone  cypionate (DEPOTESTOSTERONE CYPIONATE) 200 MG/ML injection Inject 0.4 mLs into the muscle every Wednesday.     No current facility-administered medications for this visit.    REVIEW OF SYSTEMS:   [X]  denotes positive finding, [ ]  denotes negative finding Cardiac  Comments:  Chest pain or chest pressure:    Shortness of breath upon exertion:    Short of breath when lying flat:    Irregular heart rhythm:        Vascular    Pain in calf, thigh, or hip brought on by ambulation:    Pain in feet at night that wakes you up from your sleep:     Blood clot in your veins:    Leg swelling:         Pulmonary    Oxygen  at home:    Productive cough:     Wheezing:         Neurologic    Sudden weakness in arms or  legs:     Sudden numbness in arms or legs:     Sudden onset of difficulty speaking or slurred speech:    Temporary loss of vision in one eye:     Problems with dizziness:         Gastrointestinal    Blood in stool:      Vomited blood:         Genitourinary    Burning when urinating:     Blood in urine:        Psychiatric    Major depression:         Hematologic    Bleeding problems:    Problems with blood clotting too easily:        Skin    Rashes or ulcers:        Constitutional    Fever or chills:     PHYSICAL EXAM:   Vitals:   01/19/24 1134  BP: 139/84  Pulse: (!) 52  Temp: 98.2 F (36.8 C)  SpO2: 95%  Weight: 197 lb (89.4 kg)  Height: 5' 11 (1.803 m)    GENERAL: The patient is a well-nourished male, in no acute distress. The vital signs are documented above. CARDIAC: There is a regular rate and rhythm.  PULMONARY: Nonlabored respirations ABDOMEN: Soft and non-tender  MUSCULOSKELETAL: There are no major deformities or cyanosis. NEUROLOGIC: No focal weakness or  paresthesias are detected. SKIN: There are no ulcers or rashes noted. PSYCHIATRIC: The patient has a normal affect.  STUDIES:   I have reviewed the following: 1. The 2.3 cm nodule along the inferior aspect of the right adrenal gland is in continuity with the right renal artery and follows signal intensity of the renal artery on all acquired sequences compatible with an aneurysm/pseudoaneurysm. Suggest vascular surgery consultation. 2. Mild pancreatic ductal dilation extending from the tail to the ampulla measuring 6 mm in the pancreatic body. No discrete pancreatic lesion identified. Suggest further evaluation with EUS/ERCP. 3. Question asymmetric wall thickening versus underdistention of the ascending colon. Consider further evaluation with colonoscopy. 4. Bilateral hemorrhagic/proteinaceous renal cysts.    ASSESSMENT and PLAN   Right renal artery aneurysm: I discussed the MRI findings  with the patient.  I would like to get more information before making treatment recommendations.  I think the best is to get a CT angiogram to determine whether or not he is a candidate for endovascular repair.  I will get this done within the next 2 to 3 weeks and have him return for further discussions.   Malvina Serene CLORE, MD, FACS [facility] [phone] Pager [phone]

## 2024-01-29 ENCOUNTER — Ambulatory Visit: Admitting: Cardiovascular Disease

## 2024-01-29 ENCOUNTER — Ambulatory Visit (HOSPITAL_COMMUNITY): Admission: RE | Admit: 2024-01-29 | Attending: Surgery | Admitting: Surgery

## 2024-01-29 DIAGNOSIS — I722 Aneurysm of renal artery: Secondary | ICD-10-CM | POA: Diagnosis present

## 2024-01-29 MED ORDER — DIPHENHYDRAMINE HCL 50 MG/ML IJ SOLN
50.0000 mg | Freq: Once | INTRAMUSCULAR | Status: AC
  Administered 2024-01-29: 10:00:00 50 mg via INTRAVENOUS

## 2024-01-29 MED ORDER — IOHEXOL 350 MG/ML SOLN
80.0000 mL | Freq: Once | INTRAVENOUS | Status: AC | PRN
  Administered 2024-01-29: 10:00:00 80 mL via INTRAVENOUS

## 2024-01-30 ENCOUNTER — Encounter: Payer: Self-pay | Admitting: Cardiovascular Disease

## 2024-01-30 ENCOUNTER — Ambulatory Visit: Attending: Cardiovascular Disease | Admitting: Cardiovascular Disease

## 2024-01-30 VITALS — BP 104/50 | HR 78 | Ht 72.0 in | Wt 197.8 lb

## 2024-01-30 DIAGNOSIS — I10 Essential (primary) hypertension: Secondary | ICD-10-CM

## 2024-01-30 DIAGNOSIS — I722 Aneurysm of renal artery: Secondary | ICD-10-CM

## 2024-01-30 DIAGNOSIS — I700 Atherosclerosis of aorta: Secondary | ICD-10-CM | POA: Diagnosis not present

## 2024-01-30 DIAGNOSIS — I352 Nonrheumatic aortic (valve) stenosis with insufficiency: Secondary | ICD-10-CM | POA: Diagnosis not present

## 2024-01-30 DIAGNOSIS — I77810 Thoracic aortic ectasia: Secondary | ICD-10-CM

## 2024-01-30 DIAGNOSIS — G4733 Obstructive sleep apnea (adult) (pediatric): Secondary | ICD-10-CM

## 2024-01-30 DIAGNOSIS — D6869 Other thrombophilia: Secondary | ICD-10-CM

## 2024-01-30 DIAGNOSIS — I484 Atypical atrial flutter: Secondary | ICD-10-CM

## 2024-01-30 NOTE — Patient Instructions (Signed)
 Medication Instructions:  Stop Hydralazine  *If you need a refill on your cardiac medications before your next appointment, please call your pharmacy*  Lab Work: None ordered If you have labs (blood work) drawn today and your tests are completely normal, you will receive your results only by: MyChart Message (if you have MyChart) OR A paper copy in the mail If you have any lab test that is abnormal or we need to change your treatment, we will call you to review the results.  Testing/Procedures: None ordered  Follow-Up: At [facility], you and your health needs are our priority.  As part of our continuing mission to provide you with exceptional heart care, our providers are all part of one team.  This team includes your primary Cardiologist (physician) and Advanced Practice Providers or APPs (Physician Assistants and Nurse Practitioners) who all work together to provide you with the care you need, when you need it.  Your next appointment:   1 year(s)  Provider:   Dr Francyne  We recommend signing up for the patient portal called MyChart.  Sign up information is provided on this After Visit Summary.  MyChart is used to connect with patients for Virtual Visits (Telemedicine).  Patients are able to view lab/test results, encounter notes, upcoming appointments, etc.  Non-urgent messages can be sent to your provider as well.   To learn more about what you can do with MyChart, go to [email or web address].

## 2024-01-30 NOTE — Progress Notes (Signed)
 " Cardiology Office Note   Date:  01/30/2024  ID:  Mario [name], DOB 20-May-1940, MRN [id number] PCP: Mario Leita Repine, FNP (Inactive)  [facility] Providers Cardiologist:  Debby Sor, MD (Inactive) Electrophysiologist:  Eulas FORBES Furbish, MD     History of Present Illness Mario [name] is a 83 y.o. male with a history of obstructive sleep apnea, persistent atrial fibrillation (history of A-fib ablation with PVI 2022 after failing flecainide  antiarrhythmic therapy; repeat EP study 2023 showed all 4 pulmonary veins were isolated, new roofline and inferior posterior wall line ablation), recurrence as atypical atrial flutter, moderate aortic insufficiency, mild aortic root dilation, HTN, aortic atherosclerosis and right renal artery aneurysm, history of prostate cancer treated with radiation therapy, returning for follow-up.  At his follow-up visit with EP, Dr. Furbish, 1 year ago options for dofetilide loading, repeat ablation with pulsed field array versus continuing management with rate control only were discussed.  The patient prefers rate control.  He has been found to have a relatively large aneurysm of the right renal artery (roughly 2.3 cm diameter), discovered incidentally during an MRI performed for hematuria.  He just had a CT angiogram yesterday and has not yet reviewed the results with Dr. Serene.  Hopefully this can be treated with a covered stent.  He does have evidence of atherosclerosis in the abdominal aorta, but his coronary calcium score in 2022 at age 50 was only 50 (12th percentile).  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  He had left knee surgery (unicompartmental left medial knee arthroplasty) in August.  No bleeding problems on Eliquis .  He generally feels very well and remains very active.  He does not have the same strength since he underwent his right shoulder surgery in  2024, but continues exercising at the gym.  Most recent echo in December 2024 showed normal left ventricular systolic function with EF 55-60% and normal diastolic function, severe biatrial dilation, moderate aortic insufficiency, moderate dilation of the aortic root at 40 mm.  The mean aortic valve gradient was not measured.  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  He had a low risk nuclear stress test in 2016.  He had a remote normal cardiac catheterization in the 80s.  His stepfather Mario [name] was my patient for many years.  Mario [name]'s wife, Mario [name], would bring him to the appointments.  Studies Reviewed EKG Interpretation Date/Time:  Friday January 30 2024 09:21:52 EST Ventricular Rate:  78 PR Interval:    QRS Duration:  108 QT Interval:  350 QTC Calculation: 399 R Axis:   61  Text Interpretation: Atrial flutter with variable A-V block When compared with ECG of 29-Jul-2023 10:55, Atrial flutter has replaced Atrial fibrillation Nonspecific T wave abnormality now evident in Inferior leads Confirmed by Rocco Kerkhoff [phone]) on 01/30/2024 9:24:33 AM     Risk Assessment/Calculations  CHA2DS2-VASc Score = 4   This indicates a 4.8% annual risk of stroke. The patient's score is based upon: CHF History: 0 HTN History: 1 Diabetes History: 0 Stroke History: 0 Vascular Disease History: 1 Age Score: 2 Gender Score: 0            Physical Exam VS:  BP (!) 104/50 (BP Location: Left Arm, Patient Position: Sitting, Cuff Size: Normal)   Pulse 78   Ht 6' (1.829 m)  Wt 197 lb 12.8 oz (89.7 kg)   BMI 26.83 kg/m        Wt Readings from Last 3 Encounters:  01/30/24 197 lb 12.8 oz (89.7 kg)  01/19/24 197 lb (89.4 kg)  09/17/23 200 lb (90.7 kg)    GEN: Well nourished, well developed in no acute distress.  Appears very muscular and fit and younger than stated age. NECK: No JVD; No carotid bruits CARDIAC:  RRR, no murmurs, rubs, gallops RESPIRATORY:  Clear to auscultation without rales, wheezing or rhonchi  ABDOMEN: Soft, non-tender, non-distended EXTREMITIES:  No edema; No deformity   ASSESSMENT AND PLAN Atypical atrial flutter: He has had 2 previous ablation procedures including satisfactory isolation of all 4 pulmonary veins and the previous posterior wall ablation that was limited by heating of the esophagus.  Pulsed field array ablation has been offered to, but he is asymptomatic and would rather not have another procedure.  His rate control is excellent and he is compliant with anticoagulation. Anticoagulation: No problems with apixaban  other than easy bruising. Aortic atherosclerosis/right renal artery aneurysm: Reviewed yesterday's CT.  The aneurysm appears to be attached to the mid section of the right renal artery and hopefully Dr. Serene can treat this with a covered stent.  We discussed the importance of treating risk factors to prevent disease progression.  He does have significant atherosclerotic plaque in the aorta.  On the other hand he has not had any clinical manifestations of CAD or PAD at age 54.  LDL cholesterol is borderline at 85.  His coronary calcium score in 2022 was actually very low at only 29 (12th percentile) he prefers not to take lipid-lowering medications will focus on healthy diet and will continue to exercise. HTN: Blood pressure is quite low.  I do not think he needs to continue taking hydralazine  and stop this.  If his blood pressure continues to be low we can consider stopping the irbesartan  or cutting back on the amlodipine .  If on the contrary, his blood pressure gets to be too high, rather than restarting hydralazine  I would increase the dose of irbesartan .   Target systolic BP less than 130, but avoid diastolic less than 60. Aortic insufficiency: Reported as moderate on his most recent echocardiogram.  The aortic valve is trileaflet and he does not have meaningful  aortic stenosis but he has dilation of the ascending aorta.  Normal left ventricular size and function.  Asymptomatic.  May explain his rather low diastolic blood pressure.  Monitor with echo every 2-3 years. Borderline dilation of the ascending aorta: At 4.3 cm on CT angiogram from November 2023, virtually unchanged when compared to the previous CT 2 years earlier at 4.2 cm..  I doubt that we will need to consider preventative surgery for this gentleman, but this may become an issue if his aortic insufficiency worsens.. OSA: He believes treating this with CPAP was life-changing.  He is very compliant with therapy.  Will transition to Dr. Dorine sleep clinic.       Dispo: Stop hydralazine .  Follow-up in 1 year.  Signed, Jerel Balding, MD   "

## 2024-02-16 ENCOUNTER — Encounter: Payer: Self-pay | Admitting: Surgery

## 2024-02-16 ENCOUNTER — Ambulatory Visit: Attending: Surgery | Admitting: Surgery

## 2024-02-16 VITALS — BP 138/69 | HR 70 | Temp 98.3°F | Ht 72.0 in | Wt 194.3 lb

## 2024-02-16 DIAGNOSIS — I722 Aneurysm of renal artery: Secondary | ICD-10-CM | POA: Diagnosis not present

## 2024-02-16 NOTE — H&P (View-Only) (Signed)
 "                                    [facility]  Patient name: Mario [name] MRN: [id number] DOB: 04-May-1940 Sex: male   REASON FOR VISIT:    Follow up  HISOTRY OF PRESENT ILLNESS:    Mario [name] is a 84 y.o. male who I met in December 2025 for evaluation of a right renal artery aneurysm detected on a MRI for workup of hematuria.  He does not have any abdominal pain.  He does have a history of prostate cancer.  He is anticoagulated for atrial fibrillation.  He is medically managed for hypertension and is a former smoker.  He does still compete as a camera operator.  I sent him for a CT scan to better evaluate this.   PAST MEDICAL HISTORY:   Past Medical History:  Diagnosis Date   Anticoagulant long-term use    eliquis --- managed by cardiology   Arthritis    neck   Benign localized prostatic hyperplasia with lower urinary tract symptoms (LUTS)    Blister    06-25-2021  per pt has a small blister at end of tailbone from doing sit-up's, covers w/ band-aid   ED (erectile dysfunction)    First degree heart block    History of adenomatous polyp of colon    History of basal cell carcinoma (BCC) of skin    excision multiple area's   History of kidney stones    History of malignant melanoma of back 2000   mid back  s/p WLE 12/ 2011 stage 1, localized per pt and no recurrence   History of narrow angle glaucoma    per pt s/p laser both eyes no issues since approx 2006   History of squamous cell carcinoma of skin    excision multiple area's   Hypertension    Hypogonadism in male    Malignant neoplasm prostate (HCC) 03/2021   urologist--- dr bell/  radiation oncology-- dr patrcia;  dx 02/ 2069m  Gleason 3+4   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    per cardiac CT 02-24-2020   42mm   OSA on CPAP    per pt uses nightly   Persistent atrial fibrillation [facility])    cardiologist--- dr oneida. burnard;   DCCV 02-22-2020 and 12-01-2014;  03-02-2020  s/p afib ablation by Dr  Kelsie   Pneumonia    Seasonal allergies    Wears hearing aid in both ears      FAMILY HISTORY:   Family History  Problem Relation Age of Onset   Heart disease Mother    Stroke Father    Other Father        brain tumor-benign   Heart disease Sister    Heart disease Sister    Hypertension Sister    Colon cancer Neg Hx    Stomach cancer Neg Hx    Esophageal cancer Neg Hx    Pancreatic cancer Neg Hx    Liver disease Neg Hx     SOCIAL HISTORY:   Social History   Tobacco Use   Smoking status: Former    Current packs/day: 0.00    Types: Cigarettes    Start date: 11/20/1976    Quit date: 1986    Years since quitting: 40.0   Smokeless tobacco: Never  Substance Use Topics   Alcohol use: Not Currently  ALLERGIES:   Allergies[1]   CURRENT MEDICATIONS:   Current Outpatient Medications  Medication Sig Dispense Refill   albuterol  (VENTOLIN  HFA) 108 (90 Base) MCG/ACT inhaler Inhale 1-2 puffs into the lungs every 6 (six) hours as needed for wheezing or shortness of breath. (Patient not taking: Reported on 01/30/2024) 8.5 g 0   amLODipine  (NORVASC ) 10 MG tablet Take 1 tablet (10 mg total) by mouth daily. 90 tablet 3   apixaban  (ELIQUIS ) 5 MG TABS tablet Take 5 mg by mouth 2 (two) times daily.     azelastine  (ASTELIN ) 0.1 % nasal spray Place 1 spray into both nostrils 2 (two) times daily. Use in each nostril as directed (Patient not taking: Reported on 01/30/2024) 30 mL 1   budesonide  (PULMICORT ) 0.5 MG/2ML nebulizer solution Take 2 mLs (0.5 mg total) by nebulization in the morning and at bedtime. MIX 1 VIAL WITH 250CC OF NORMAL SALINE FOR SINUS WASH DAILY AS NEEDED (Patient not taking: Reported on 01/30/2024) 120 mL 12   Carboxymethylcellulose Sodium (THERATEARS PF OP) Place 1 drop into both eyes daily as needed (dry eyes).     carvedilol  (COREG ) 6.25 MG tablet Take 1 tablet by mouth in the morning and at bedtime. 180 tablet 3   fluticasone  (FLONASE ) 50 MCG/ACT nasal spray  Place 2 sprays into both nostrils daily. (Patient not taking: Reported on 01/30/2024) 16 g 6   fluticasone -salmeterol (WIXELA INHUB) 100-50 MCG/ACT AEPB Inhale 1 puff into the lungs 2 (two) times daily. (Patient taking differently: Inhale 1 puff into the lungs daily.) 1 each 0   GLUCOSAMINE-CHONDROITIN PO Take 2 tablets by mouth daily.     irbesartan  (AVAPRO ) 75 MG tablet Take 1 tablet (75 mg total) by mouth daily. 90 tablet 0   Lutein 20 MG CAPS Take 20 mg by mouth daily.     METAMUCIL FIBER PO Take 1 Scoop by mouth 4 (four) times a week.     Multiple Vitamin (MULTIVITAMIN) tablet Take 1 tablet by mouth daily.     Saw Palmetto, Serenoa repens, (SAW PALMETTO PO) Take 2 tablets by mouth daily.     testosterone  cypionate (DEPOTESTOSTERONE CYPIONATE) 200 MG/ML injection Inject 0.4 mLs into the muscle every Wednesday.     No current facility-administered medications for this visit.    REVIEW OF SYSTEMS:   [X]  denotes positive finding, [ ]  denotes negative finding Cardiac  Comments:  Chest pain or chest pressure:    Shortness of breath upon exertion:    Short of breath when lying flat:    Irregular heart rhythm:        Vascular    Pain in calf, thigh, or hip brought on by ambulation:    Pain in feet at night that wakes you up from your sleep:     Blood clot in your veins:    Leg swelling:         Pulmonary    Oxygen  at home:    Productive cough:     Wheezing:         Neurologic    Sudden weakness in arms or legs:     Sudden numbness in arms or legs:     Sudden onset of difficulty speaking or slurred speech:    Temporary loss of vision in one eye:     Problems with dizziness:         Gastrointestinal    Blood in stool:     Vomited blood:         Genitourinary  Burning when urinating:     Blood in urine:        Psychiatric    Major depression:         Hematologic    Bleeding problems:    Problems with blood clotting too easily:        Skin    Rashes or ulcers:         Constitutional    Fever or chills:      PHYSICAL EXAM:   There were no vitals filed for this visit.  GENERAL: The patient is a well-nourished male, in no acute distress. The vital signs are documented above. CARDIAC: There is a regular rate and rhythm PULMONARY: Non-labored respirations ABDOMEN: Soft and non-tender  MUSCULOSKELETAL: There are no major deformities or cyanosis. NEUROLOGIC: No focal weakness or paresthesias are detected. SKIN: There are no ulcers or rashes noted. PSYCHIATRIC: The patient has a normal affect.  STUDIES:   I have reviewed the CT scan with the following findings: VASCULAR   1. Right renal artery aneurysm. This is a saccular aneurysm involving the dominant right renal artery branch. Aneurysm neck appears to be wide measuring approximately 1.3 cm. Aneurysm sac measures up to 2.9 cm and stable since 07/25/2023. 2. 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 3. Mild stenosis at the origin of the right renal artery. 4.  Aortic Atherosclerosis (ICD10-I70.0).   NON-VASCULAR   1. Prostate enlargement with chronic bladder wall thickening. 2. No acute abnormality in the abdomen or pelvis. 3. Small amount of chronic fluid in the pelvis.  MEDICAL ISSUES:   Right renal artery aneurysm: We went over the CT scan today that shows a 3 cm right renal artery aneurysm.  He does have a early branch of his renal artery.  The lower branch provides blood flow to approximately 50% of kidney.  I think I can stent the upper branch which has the aneurysm without compromising blood flow to his kidney however I did discuss the possibility that this could impact renal function and perfusion of the kidney if the stent thrombosis or if I ended up covering the branch.  He wants to get this done as soon as possible as he is having a little bit of abdominal pain.  It is scheduled for next Tuesday.  He will need to be off of his Eliquis .  I plan on doing triple therapy for 1  month and then going to Eliquis  and aspirin     Malvina New, IV, MD, FACS [facility] [phone] Pager [phone]      [1]  Allergies Allergen Reactions   Contrast Media [Iodinated Contrast Media] Shortness Of Breath, Rash and Other (See Comments)    First noted after Coronary CTA 02/24/20. Pt experienced wheezing, shortness of breath and diffuse rash/welts over face and trunk.   Bee Venom Other (See Comments)    Becomes light headed; faint. Has to take benadryl     Lisinopril Other (See Comments)    REACTION: severe GI upset requiring EGD--temporally related to lisinopril--tolerated micardis   "

## 2024-02-16 NOTE — Progress Notes (Signed)
 "                                    [facility]  Patient name: Mario [name] MRN: [id number] DOB: 04-May-1940 Sex: male   REASON FOR VISIT:    Follow up  HISOTRY OF PRESENT ILLNESS:    Mario [name] is a 84 y.o. male who I met in December 2025 for evaluation of a right renal artery aneurysm detected on a MRI for workup of hematuria.  He does not have any abdominal pain.  He does have a history of prostate cancer.  He is anticoagulated for atrial fibrillation.  He is medically managed for hypertension and is a former smoker.  He does still compete as a camera operator.  I sent him for a CT scan to better evaluate this.   PAST MEDICAL HISTORY:   Past Medical History:  Diagnosis Date   Anticoagulant long-term use    eliquis --- managed by cardiology   Arthritis    neck   Benign localized prostatic hyperplasia with lower urinary tract symptoms (LUTS)    Blister    06-25-2021  per pt has a small blister at end of tailbone from doing sit-up's, covers w/ band-aid   ED (erectile dysfunction)    First degree heart block    History of adenomatous polyp of colon    History of basal cell carcinoma (BCC) of skin    excision multiple area's   History of kidney stones    History of malignant melanoma of back 2000   mid back  s/p WLE 12/ 2011 stage 1, localized per pt and no recurrence   History of narrow angle glaucoma    per pt s/p laser both eyes no issues since approx 2006   History of squamous cell carcinoma of skin    excision multiple area's   Hypertension    Hypogonadism in male    Malignant neoplasm prostate (HCC) 03/2021   urologist--- dr bell/  radiation oncology-- dr patrcia;  dx 02/ 2069m  Gleason 3+4   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    per cardiac CT 02-24-2020   42mm   OSA on CPAP    per pt uses nightly   Persistent atrial fibrillation [facility])    cardiologist--- dr oneida. burnard;   DCCV 02-22-2020 and 12-01-2014;  03-02-2020  s/p afib ablation by Dr  Kelsie   Pneumonia    Seasonal allergies    Wears hearing aid in both ears      FAMILY HISTORY:   Family History  Problem Relation Age of Onset   Heart disease Mother    Stroke Father    Other Father        brain tumor-benign   Heart disease Sister    Heart disease Sister    Hypertension Sister    Colon cancer Neg Hx    Stomach cancer Neg Hx    Esophageal cancer Neg Hx    Pancreatic cancer Neg Hx    Liver disease Neg Hx     SOCIAL HISTORY:   Social History   Tobacco Use   Smoking status: Former    Current packs/day: 0.00    Types: Cigarettes    Start date: 11/20/1976    Quit date: 1986    Years since quitting: 40.0   Smokeless tobacco: Never  Substance Use Topics   Alcohol use: Not Currently  ALLERGIES:   Allergies[1]   CURRENT MEDICATIONS:   Current Outpatient Medications  Medication Sig Dispense Refill   albuterol  (VENTOLIN  HFA) 108 (90 Base) MCG/ACT inhaler Inhale 1-2 puffs into the lungs every 6 (six) hours as needed for wheezing or shortness of breath. (Patient not taking: Reported on 01/30/2024) 8.5 g 0   amLODipine  (NORVASC ) 10 MG tablet Take 1 tablet (10 mg total) by mouth daily. 90 tablet 3   apixaban  (ELIQUIS ) 5 MG TABS tablet Take 5 mg by mouth 2 (two) times daily.     azelastine  (ASTELIN ) 0.1 % nasal spray Place 1 spray into both nostrils 2 (two) times daily. Use in each nostril as directed (Patient not taking: Reported on 01/30/2024) 30 mL 1   budesonide  (PULMICORT ) 0.5 MG/2ML nebulizer solution Take 2 mLs (0.5 mg total) by nebulization in the morning and at bedtime. MIX 1 VIAL WITH 250CC OF NORMAL SALINE FOR SINUS WASH DAILY AS NEEDED (Patient not taking: Reported on 01/30/2024) 120 mL 12   Carboxymethylcellulose Sodium (THERATEARS PF OP) Place 1 drop into both eyes daily as needed (dry eyes).     carvedilol  (COREG ) 6.25 MG tablet Take 1 tablet by mouth in the morning and at bedtime. 180 tablet 3   fluticasone  (FLONASE ) 50 MCG/ACT nasal spray  Place 2 sprays into both nostrils daily. (Patient not taking: Reported on 01/30/2024) 16 g 6   fluticasone -salmeterol (WIXELA INHUB) 100-50 MCG/ACT AEPB Inhale 1 puff into the lungs 2 (two) times daily. (Patient taking differently: Inhale 1 puff into the lungs daily.) 1 each 0   GLUCOSAMINE-CHONDROITIN PO Take 2 tablets by mouth daily.     irbesartan  (AVAPRO ) 75 MG tablet Take 1 tablet (75 mg total) by mouth daily. 90 tablet 0   Lutein 20 MG CAPS Take 20 mg by mouth daily.     METAMUCIL FIBER PO Take 1 Scoop by mouth 4 (four) times a week.     Multiple Vitamin (MULTIVITAMIN) tablet Take 1 tablet by mouth daily.     Saw Palmetto, Serenoa repens, (SAW PALMETTO PO) Take 2 tablets by mouth daily.     testosterone  cypionate (DEPOTESTOSTERONE CYPIONATE) 200 MG/ML injection Inject 0.4 mLs into the muscle every Wednesday.     No current facility-administered medications for this visit.    REVIEW OF SYSTEMS:   [X]  denotes positive finding, [ ]  denotes negative finding Cardiac  Comments:  Chest pain or chest pressure:    Shortness of breath upon exertion:    Short of breath when lying flat:    Irregular heart rhythm:        Vascular    Pain in calf, thigh, or hip brought on by ambulation:    Pain in feet at night that wakes you up from your sleep:     Blood clot in your veins:    Leg swelling:         Pulmonary    Oxygen  at home:    Productive cough:     Wheezing:         Neurologic    Sudden weakness in arms or legs:     Sudden numbness in arms or legs:     Sudden onset of difficulty speaking or slurred speech:    Temporary loss of vision in one eye:     Problems with dizziness:         Gastrointestinal    Blood in stool:     Vomited blood:         Genitourinary  Burning when urinating:     Blood in urine:        Psychiatric    Major depression:         Hematologic    Bleeding problems:    Problems with blood clotting too easily:        Skin    Rashes or ulcers:         Constitutional    Fever or chills:      PHYSICAL EXAM:   There were no vitals filed for this visit.  GENERAL: The patient is a well-nourished male, in no acute distress. The vital signs are documented above. CARDIAC: There is a regular rate and rhythm PULMONARY: Non-labored respirations ABDOMEN: Soft and non-tender  MUSCULOSKELETAL: There are no major deformities or cyanosis. NEUROLOGIC: No focal weakness or paresthesias are detected. SKIN: There are no ulcers or rashes noted. PSYCHIATRIC: The patient has a normal affect.  STUDIES:   I have reviewed the CT scan with the following findings: VASCULAR   1. Right renal artery aneurysm. This is a saccular aneurysm involving the dominant right renal artery branch. Aneurysm neck appears to be wide measuring approximately 1.3 cm. Aneurysm sac measures up to 2.9 cm and stable since 07/25/2023. 2. 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 3. Mild stenosis at the origin of the right renal artery. 4.  Aortic Atherosclerosis (ICD10-I70.0).   NON-VASCULAR   1. Prostate enlargement with chronic bladder wall thickening. 2. No acute abnormality in the abdomen or pelvis. 3. Small amount of chronic fluid in the pelvis.  MEDICAL ISSUES:   Right renal artery aneurysm: We went over the CT scan today that shows a 3 cm right renal artery aneurysm.  He does have a early branch of his renal artery.  The lower branch provides blood flow to approximately 50% of kidney.  I think I can stent the upper branch which has the aneurysm without compromising blood flow to his kidney however I did discuss the possibility that this could impact renal function and perfusion of the kidney if the stent thrombosis or if I ended up covering the branch.  He wants to get this done as soon as possible as he is having a little bit of abdominal pain.  It is scheduled for next Tuesday.  He will need to be off of his Eliquis .  I plan on doing triple therapy for 1  month and then going to Eliquis  and aspirin     Malvina New, IV, MD, FACS [facility] [phone] Pager [phone]      [1]  Allergies Allergen Reactions   Contrast Media [Iodinated Contrast Media] Shortness Of Breath, Rash and Other (See Comments)    First noted after Coronary CTA 02/24/20. Pt experienced wheezing, shortness of breath and diffuse rash/welts over face and trunk.   Bee Venom Other (See Comments)    Becomes light headed; faint. Has to take benadryl     Lisinopril Other (See Comments)    REACTION: severe GI upset requiring EGD--temporally related to lisinopril--tolerated micardis   "

## 2024-02-17 DIAGNOSIS — I722 Aneurysm of renal artery: Secondary | ICD-10-CM

## 2024-02-24 ENCOUNTER — Encounter (HOSPITAL_COMMUNITY): Payer: Self-pay | Admitting: Surgery

## 2024-02-24 ENCOUNTER — Ambulatory Visit (HOSPITAL_COMMUNITY): Admission: RE | Admit: 2024-02-24 | Discharge: 2024-02-24 | Disposition: A | Attending: Surgery | Admitting: Surgery

## 2024-02-24 ENCOUNTER — Other Ambulatory Visit: Payer: Self-pay

## 2024-02-24 ENCOUNTER — Encounter (HOSPITAL_COMMUNITY): Admission: RE | Disposition: A | Payer: Self-pay | Attending: Surgery

## 2024-02-24 DIAGNOSIS — Z87891 Personal history of nicotine dependence: Secondary | ICD-10-CM | POA: Insufficient documentation

## 2024-02-24 DIAGNOSIS — I4819 Other persistent atrial fibrillation: Secondary | ICD-10-CM | POA: Insufficient documentation

## 2024-02-24 DIAGNOSIS — I722 Aneurysm of renal artery: Secondary | ICD-10-CM

## 2024-02-24 DIAGNOSIS — Z7901 Long term (current) use of anticoagulants: Secondary | ICD-10-CM | POA: Diagnosis not present

## 2024-02-24 HISTORY — PX: RENAL INTERVENTION: CATH118261

## 2024-02-24 HISTORY — PX: RENAL ANGIOGRAPHY: CATH118260

## 2024-02-24 LAB — POCT I-STAT, CHEM 8
BUN: 22 mg/dL (ref 8–23)
Calcium, Ion: 1.31 mmol/L (ref 1.15–1.40)
Chloride: 100 mmol/L (ref 98–111)
Creatinine, Ser: 1 mg/dL (ref 0.61–1.24)
Glucose, Bld: 102 mg/dL — ABNORMAL HIGH (ref 70–99)
HCT: 45 % (ref 39.0–52.0)
Hemoglobin: 15.3 g/dL (ref 13.0–17.0)
Potassium: 4.3 mmol/L (ref 3.5–5.1)
Sodium: 141 mmol/L (ref 135–145)
TCO2: 25 mmol/L (ref 22–32)

## 2024-02-24 MED ORDER — ASPIRIN 81 MG PO CHEW
CHEWABLE_TABLET | ORAL | Status: DC | PRN
  Administered 2024-02-24: 81 mg via ORAL

## 2024-02-24 MED ORDER — SODIUM CHLORIDE 0.9% FLUSH: 3.0000 mL | Freq: Two times a day (BID) | INTRAVENOUS | Status: DC

## 2024-02-24 MED ORDER — LIDOCAINE HCL (PF) 1 % IJ SOLN
INTRAMUSCULAR | Status: AC
  Filled 2024-02-24: qty 30

## 2024-02-24 MED ORDER — OXYCODONE HCL 5 MG PO TABS: 5.0000 mg | ORAL_TABLET | ORAL | Status: DC | PRN

## 2024-02-24 MED ORDER — FENTANYL CITRATE (PF) 100 MCG/2ML IJ SOLN
INTRAMUSCULAR | Status: DC | PRN
  Administered 2024-02-24: 50 ug via INTRAVENOUS
  Administered 2024-02-24: 25 ug via INTRAVENOUS

## 2024-02-24 MED ORDER — SODIUM CHLORIDE 0.9% FLUSH: 3.0000 mL | INTRAVENOUS | Status: DC | PRN

## 2024-02-24 MED ORDER — CLOPIDOGREL BISULFATE 75 MG PO TABS: 75.0000 mg | ORAL_TABLET | Freq: Every day | ORAL | 11 refills | Status: AC

## 2024-02-24 MED ORDER — HYDRALAZINE HCL 20 MG/ML IJ SOLN: 5.0000 mg | INTRAMUSCULAR | Status: DC | PRN

## 2024-02-24 MED ORDER — CLOPIDOGREL BISULFATE 75 MG PO TABS
ORAL_TABLET | ORAL | Status: AC
  Filled 2024-02-24: qty 1

## 2024-02-24 MED ORDER — LIDOCAINE HCL (PF) 1 % IJ SOLN
INTRAMUSCULAR | Status: DC | PRN
  Administered 2024-02-24: 15 mL

## 2024-02-24 MED ORDER — HEPARIN (PORCINE) IN NACL 1000-0.9 UT/500ML-% IV SOLN
INTRAVENOUS | Status: DC | PRN
  Administered 2024-02-24 (×2): 500 mL

## 2024-02-24 MED ORDER — HEPARIN SODIUM (PORCINE) 1000 UNIT/ML IJ SOLN
INTRAMUSCULAR | Status: DC | PRN
  Administered 2024-02-24: 10000 [IU] via INTRAVENOUS

## 2024-02-24 MED ORDER — CEFAZOLIN SODIUM-DEXTROSE 2-4 GM/100ML-% IV SOLN: 2.0000 g | INTRAVENOUS | Status: DC

## 2024-02-24 MED ORDER — ASPIRIN 81 MG PO CHEW
CHEWABLE_TABLET | ORAL | Status: AC
  Filled 2024-02-24: qty 1

## 2024-02-24 MED ORDER — ASPIRIN 81 MG PO TBEC: 81.0000 mg | DELAYED_RELEASE_TABLET | Freq: Every day | ORAL | 2 refills | Status: AC

## 2024-02-24 MED ORDER — SODIUM CHLORIDE 0.9 % IV SOLN: INTRAVENOUS | Status: DC

## 2024-02-24 MED ORDER — MORPHINE SULFATE (PF) 2 MG/ML IV SOLN: 2.0000 mg | INTRAVENOUS | Status: DC | PRN

## 2024-02-24 MED ORDER — ACETAMINOPHEN 325 MG PO TABS: 650.0000 mg | ORAL_TABLET | ORAL | Status: DC | PRN

## 2024-02-24 MED ORDER — CHLORHEXIDINE GLUCONATE CLOTH 2 % EX PADS: 6.0000 | MEDICATED_PAD | Freq: Once | CUTANEOUS | Status: DC

## 2024-02-24 MED ORDER — LABETALOL HCL 5 MG/ML IV SOLN: 10.0000 mg | INTRAVENOUS | Status: DC | PRN

## 2024-02-24 MED ORDER — DIPHENHYDRAMINE HCL 50 MG/ML IJ SOLN
25.0000 mg | Freq: Once | INTRAMUSCULAR | Status: AC
  Administered 2024-02-24: 25 mg via INTRAVENOUS
  Filled 2024-02-24: qty 1

## 2024-02-24 MED ORDER — MIDAZOLAM HCL (PF) 2 MG/2ML IJ SOLN
INTRAMUSCULAR | Status: DC | PRN
  Administered 2024-02-24: 1 mg via INTRAVENOUS
  Administered 2024-02-24: 2 mg via INTRAVENOUS

## 2024-02-24 MED ORDER — ONDANSETRON HCL 4 MG/2ML IJ SOLN: 4.0000 mg | Freq: Four times a day (QID) | INTRAMUSCULAR | Status: DC | PRN

## 2024-02-24 MED ORDER — METHYLPREDNISOLONE SODIUM SUCC 125 MG IJ SOLR
125.0000 mg | Freq: Once | INTRAMUSCULAR | Status: AC
  Administered 2024-02-24: 125 mg via INTRAVENOUS
  Filled 2024-02-24: qty 2

## 2024-02-24 MED ORDER — CLOPIDOGREL BISULFATE 75 MG PO TABS: 75.0000 mg | ORAL_TABLET | Freq: Every day | ORAL | Status: DC

## 2024-02-24 MED ORDER — HEPARIN SODIUM (PORCINE) 1000 UNIT/ML IJ SOLN
INTRAMUSCULAR | Status: AC
  Filled 2024-02-24: qty 10

## 2024-02-24 MED ORDER — ASPIRIN 81 MG PO TBEC: 81.0000 mg | DELAYED_RELEASE_TABLET | Freq: Every day | ORAL | Status: DC

## 2024-02-24 MED ORDER — FENTANYL CITRATE (PF) 100 MCG/2ML IJ SOLN
INTRAMUSCULAR | Status: AC
  Filled 2024-02-24: qty 2

## 2024-02-24 MED ORDER — CLOPIDOGREL BISULFATE 300 MG PO TABS
ORAL_TABLET | ORAL | Status: DC | PRN
  Administered 2024-02-24: 75 mg via ORAL

## 2024-02-24 MED ORDER — SODIUM CHLORIDE 0.9 % WEIGHT BASED INFUSION: 1.0000 mL/kg/h | INTRAVENOUS | Status: DC

## 2024-02-24 MED ORDER — MIDAZOLAM HCL 2 MG/2ML IJ SOLN
INTRAMUSCULAR | Status: AC
  Filled 2024-02-24: qty 2

## 2024-02-24 MED ORDER — SODIUM CHLORIDE 0.9 % IV SOLN: 250.0000 mL | INTRAVENOUS | Status: DC | PRN

## 2024-02-24 NOTE — Interval H&P Note (Signed)
 History and Physical Interval Note:  02/24/2024 9:24 AM  Mario [name]  has presented today for surgery, with the diagnosis of renal r aneurysm.  The various methods of treatment have been discussed with the patient and family. After consideration of risks, benefits and other options for treatment, the patient has consented to  Procedures: RENAL INTERVENTION (N/A) RENAL ANGIOGRAPHY (N/A) as a surgical intervention.  The patient's history has been reviewed, patient examined, no change in status, stable for surgery.  I have reviewed the patient's chart and labs.  Questions were answered to the patient's satisfaction.     Malvina New

## 2024-02-24 NOTE — Op Note (Signed)
" ° ° °  Patient name: Mario [name] MRN: [id number] DOB: 1940/05/28 Sex: male  02/24/2024 Pre-operative Diagnosis: Right renal artery aneurysm Post-operative diagnosis:  Same Surgeon:  Malvina New Procedure Performed:  1.  Ultrasound-guided access, right femoral artery  2.  Abdominal aortogram  3.  Selective injection of with catheter in right renal artery and right renal secondary branch  4.  Endovascular pair of right renal artery aneurysm  5.  Conscious sedation, 46 minutes  6.  Closure device, Celt   Indications: This is an 84 year old gentleman with a roughly 3 cm right renal artery aneurysm who comes in today for endovascular pair  Procedure:  The patient was identified in the holding area and taken to room 8.  The patient was then placed supine on the table and prepped and draped in the usual sterile fashion.  A time out was called.  Conscious sedation was administered with the use of IV fentanyl  and Versed  under continuous physician and nurse monitoring.  Heart rate, blood pressure, and oxygen  saturation were continuously monitored.  Total sedation time was 46 minutes.  Ultrasound was used to evaluate the right common femoral artery.  It was patent .  A digital ultrasound image was acquired.  A micropuncture needle was used to access the right common femoral artery under ultrasound guidance.  An 018 wire was advanced without resistance and a micropuncture sheath was placed.  The 018 wire was removed and a benson wire was placed.  The micropuncture sheath was exchanged for a 5 french sheath.  An omniflush catheter was advanced over the wire to the level of L-1.  An abdominal angiogram was obtained.   Findings:   Aortogram: No significant renal artery stenosis was visualized.  There is a large right renal artery aneurysm.  The infrarenal abdominal aorta is widely patent.  Bilateral common and external iliac arteries are widely patent.  Right renal artery: Bifurcation of the main renal artery  occurs approximately 1 cm beyond the origin.  There is no renal artery stenosis at the origin.  In the superior branch there is a large renal artery aneurysm  Intervention: After the above images were acquired the decision was made to proceed with intervention.  A 6.5 cm Oscor sheath was inserted.  The patient was fully heparinized.  The Oscor sheath was deflected so that it engaged the right renal artery.  Selective injections were performed to better define the anatomy.  Next using an 035 Glidewire and a quick cross catheter I selected the superior pole branch which was confirmed with a contrast injection through the quick cross catheter at this level.  I then manipulated the Glidewire out beyond the aneurysm and performed additional angiography at that level confirming that I was across the lesion and to better define the anatomy.  I then switched out for a Rosen wire.  I selected a 5 x 39 VBX stent and deployed this across the aneurysm.  Completion imaging showed successful exclusion of the aneurysm with preservation of all branches.  Catheters and wires were removed.  The groin was closed with a Celt  Impression:  #1  Successful endovascular repair of right renal artery aneurysm using a 5 x 39 VBX  #2.  Patient will be on triple therapy for 1 month and then transition back to Eliquis  and aspirin   V. Malvina New, M.D., [facility] [place] Office: [phone] Pager:  [phone]  "

## 2024-02-24 NOTE — Discharge Instructions (Addendum)
 Restart Eliquis  on 02/26/2024  Femoral Site Care This sheet gives you information about how to care for yourself after your procedure. Your health care provider may also give you more specific instructions. If you have problems or questions, contact your health care provider. What can I expect after the procedure?  After the procedure, it is common to have: Bruising that usually fades within 1-2 weeks. Tenderness at the site. Follow these instructions at home: Wound care Follow instructions from your health care provider about how to take care of your insertion site. Make sure you: Wash your hands with soap and water  before you change your bandage (dressing). If soap and water  are not available, use hand sanitizer. Remove your dressing as told by your health care provider. 24 hours Do not take baths, swim, or use a hot tub until your health care provider approves. You may shower 24-48 hours after the procedure or as told by your health care provider. Gently wash the site with plain soap and water . Pat the area dry with a clean towel. Do not rub the site. This may cause bleeding. Do not apply powder or lotion to the site. Keep the site clean and dry. Check your femoral site every day for signs of infection. Check for: Redness, swelling, or pain. Fluid or blood. Warmth. Pus or a bad smell. Activity For the first 2-3 days after your procedure, or as long as directed: Avoid climbing stairs as much as possible. Do not squat. Do not lift anything that is heavier than 10 lb (4.5 kg), or the limit that you are told, until your health care provider says that it is safe. For 5 days Rest as directed. Avoid sitting for a long time without moving. Get up to take short walks every 1-2 hours. Do not drive for 24 hours if you were given a medicine to help you relax (sedative). General instructions Take over-the-counter and prescription medicines only as told by your health care provider. Keep all  follow-up visits as told by your health care provider. This is important. Contact a health care provider if you have: A fever or chills. You have redness, swelling, or pain around your insertion site. Get help right away if: The catheter insertion area swells very fast. You pass out. You suddenly start to sweat or your skin gets clammy. The catheter insertion area is bleeding, and the bleeding does not stop when you hold steady pressure on the area. The area near or just beyond the catheter insertion site becomes pale, cool, tingly, or numb. These symptoms may represent a serious problem that is an emergency. Do not wait to see if the symptoms will go away. Get medical help right away. Call your local emergency services (911 in the [place] Do not drive yourself to the hospital. Summary After the procedure, it is common to have bruising that usually fades within 1-2 weeks. Check your femoral site every day for signs of infection. Do not lift anything that is heavier than 10 lb (4.5 kg), or the limit that you are told, until your health care provider says that it is safe. This information is not intended to replace advice given to you by your health care provider. Make sure you discuss any questions you have with your health care provider. Document Revised: 02/10/2017 Document Reviewed: 02/10/2017 Elsevier Patient Education  2020 [employer].

## 2024-03-01 ENCOUNTER — Encounter: Admitting: Surgery

## 2024-03-04 ENCOUNTER — Ambulatory Visit (INDEPENDENT_AMBULATORY_CARE_PROVIDER_SITE_OTHER): Admitting: Otolaryngology

## 2024-03-04 ENCOUNTER — Encounter (INDEPENDENT_AMBULATORY_CARE_PROVIDER_SITE_OTHER): Payer: Self-pay | Admitting: Otolaryngology

## 2024-03-04 ENCOUNTER — Other Ambulatory Visit: Payer: Self-pay | Admitting: Cardiovascular Disease

## 2024-03-04 VITALS — BP 130/75 | HR 91 | Ht 72.0 in | Wt 195.0 lb

## 2024-03-04 DIAGNOSIS — Z87891 Personal history of nicotine dependence: Secondary | ICD-10-CM

## 2024-03-04 DIAGNOSIS — J328 Other chronic sinusitis: Secondary | ICD-10-CM

## 2024-03-04 DIAGNOSIS — J3489 Other specified disorders of nose and nasal sinuses: Secondary | ICD-10-CM | POA: Diagnosis not present

## 2024-03-04 DIAGNOSIS — J343 Hypertrophy of nasal turbinates: Secondary | ICD-10-CM | POA: Diagnosis not present

## 2024-03-04 DIAGNOSIS — R0981 Nasal congestion: Secondary | ICD-10-CM | POA: Diagnosis not present

## 2024-03-04 DIAGNOSIS — I4891 Unspecified atrial fibrillation: Secondary | ICD-10-CM

## 2024-03-04 NOTE — Progress Notes (Signed)
 Dear Dr. Jason, Here is my assessment for our mutual patient, Eon Zunker. Thank you for allowing me the opportunity to care for your patient. Please do not hesitate to contact me should you have any other questions. Sincerely, Dr. Eldora Blanch  Otolaryngology Clinic Note  HISTORY:  Initial visit (02/2024): Discussed the use of AI scribe software for clinical note transcription with the patient, who gave verbal consent to proceed.  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  He has longstanding sinonasal symptoms with recurrent episodes of severe nasal congestion causing near-complete obstruction, minimal nasal airflow, dry mouth, most noticeable with morning expectoration. He was prior seen by Dr. Soldatova multiple times who diagnosed him and started him on steroid sinus rinses. He reports that this has significantly improved his symptoms and he currently denies significant congestion or symptoms except for post-nasal drip. He denies anterior rhinorrhea, especially with any strenuous or non-strenuous activity. No headaches, light sensitivity or other meningitis symptoms including fever. No discolored drainage or facial pressure. He does weight lifting and has not had any issues with his nose. He denies current facial pain, pressure, purulent nasal drainage, or loss of smell.  He has one to two acute sinus infections per year, sometimes with low-grade fever, periorbital pain, and dental pain. Several weeks ago he had a cold but no ongoing symptoms of acute infection.  He underwent sinus surgery in his twenties, which resulted in a septal perforation. Unclear what surgery was performed. He has not had further sinonasal surgery.   He has multiple environmental allergies confirmed by testing (many years ago). He previously received weekly immunotherapy  without clear benefit. He uses intranasal fluticasone  as needed but is currently avoiding it because of bleeding concerns while anticoagulated.  He recently underwent renal artery stenting and is on AP/AC as a result    Tobacco: prior, not today  PMHx: extensive, see chart  RADIOGRAPHIC EVALUATION AND INDEPENDENT REVIEW OF OTHER RECORDS:: Dr. Okey notes reviewed CT Face 10/08/2023 independently interpreted with respect to sinuses: essentially pansinus opacification with large/polypoid MT v/s mass (unclear if from olfactory area)?  Past Medical History:  Diagnosis Date   Anticoagulant long-term use    eliquis --- managed by cardiology   Arthritis    neck   Benign localized prostatic hyperplasia with lower urinary tract symptoms (LUTS)    Blister    06-25-2021  per pt has a small blister at end of tailbone from doing sit-up's, covers w/ band-aid   ED (erectile dysfunction)    First degree heart block    History of adenomatous polyp of colon    History of basal cell carcinoma (BCC) of skin    excision multiple area's   History of kidney stones    History of malignant melanoma of back 2000   mid back  s/p WLE 12/ 2011 stage 1, localized per pt and no recurrence   History of narrow angle glaucoma    per pt s/p laser both eyes no issues since approx 2006   History of squamous cell carcinoma of skin    excision multiple area's   Hypertension    Hypogonadism in male    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 burnard;   DCCV 02-22-2020 and 12-01-2014;  03-02-2020  s/p afib ablation by Dr Kelsie   Pneumonia    Seasonal allergies    Wears hearing aid in both ears    Past Surgical History:  Procedure Laterality Date    A-FLUTTER ABLATION N/A 01/17/2022   Procedure: A-FLUTTER ABLATION;  Surgeon: Nancey Eulas BRAVO, MD;  Location: [facility];  Service: Cardiovascular;  Laterality: N/A;   ATRIAL FIBRILLATION ABLATION N/A 03/02/2020   Procedure: ATRIAL FIBRILLATION ABLATION;  Surgeon: Kelsie Agent, MD;  Location: [facility];  Service: Cardiovascular;  Laterality: N/A;   CARDIOVERSION N/A 12/01/2014   Procedure: CARDIOVERSION;  Surgeon: Debby DELENA Burnard, MD;  Location: [facility] ENDOSCOPY;  Service: Cardiovascular;  Laterality: N/A;   CARDIOVERSION N/A 02/22/2020   Procedure: CARDIOVERSION;  Surgeon: Lonni Slain, MD;  Location: [facility] ENDOSCOPY;  Service: Cardiovascular;  Laterality: N/A;   CATARACT EXTRACTION W/ INTRAOCULAR LENS IMPLANT Bilateral    2000; 2004   COLONOSCOPY WITH ESOPHAGOGASTRODUODENOSCOPY (EGD)  09/2019   CYSTOSCOPY W/ URETEROSCOPY W/ LITHOTRIPSY  05/12/2003   @[facility]    ELBOW SURGERY Left 1984   tendon repair left arm   GOLD SEED IMPLANT N/A 06/27/2021   Procedure: GOLD SEED IMPLANT;  Surgeon: Carolee Sherwood JONETTA DOUGLAS, MD;  Location: [facility];  Service: Urology;  Laterality: N/A;   INCISION AND DRAINAGE OF WOUND Left 08/28/2016   Procedure: IRRIGATION AND DEBRIDEMENT WOUND;  Surgeon: Shari Easter, MD;  Location: [facility] OR;  Service: Orthopedics;  Laterality: Left;   LIPOMA EXCISION Right 04/18/2010   right thigh   MASS EXCISION Left 07/2001   excision left axila mass (benign)   MELANOMA EXCISION  01/2000   @ [facility];   WLE of mid back for melanoma and excision resection lipoma upper back   METACARPAL OSTEOTOMY Left 08/28/2016   Procedure: 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 OR;  Service: Orthopedics;  Laterality: Left;   MICROLARYNGOSCOPY WITH LASER N/A 01/25/2015   Procedure: MICROLARYNGOSCOPY ;  Surgeon: Vaughan Ricker, MD;  Location: [facility] OR;  Service: ENT;  Laterality: N/A;  Microlaryngoscopy with excision of right medial  pyriform sinus mass with CO2 laser    PARTIAL KNEE ARTHROPLASTY Left 09/17/2023   Procedure: ARTHROPLASTY, KNEE, UNICOMPARTMENTAL;  Surgeon: Edna Toribio DELENA, MD;  Location: [facility] ORS;  Service: Orthopedics;  Laterality: Left;   RENAL ANGIOGRAPHY Right 02/24/2024   Procedure: RENAL ANGIOGRAPHY;  Surgeon: Serene Gaile ORN, MD;  Location: [facility];  Service: Cardiovascular;  Laterality: Right;   RENAL INTERVENTION Right 02/24/2024   Procedure: RENAL INTERVENTION;  Surgeon: Serene Gaile ORN, MD;  Location: [facility];  Service: Cardiovascular;  Laterality: Right;   REVERSE SHOULDER ARTHROPLASTY Right 12/05/2022   Procedure: REVERSE SHOULDER ARTHROPLASTY;  Surgeon: Cristy Bonner DASEN, MD;  Location: [facility];  Service: Orthopedics;  Laterality: Right;   SATURATION BIOPSY OF PROSTATE  01/18/2005   @[facility]   w/ anesthesia   SPACE OAR INSTILLATION N/A 06/27/2021   Procedure: SPACE OAR INSTILLATION;  Surgeon: Carolee Sherwood JONETTA DOUGLAS, MD;  Location: [facility];  Service: Urology;  Laterality: N/A;   TENDON REPAIR Right 2012   right  involving elbow, arm. hand   TONSILLECTOMY  1970   Family History  Problem Relation Age of Onset  Heart disease Mother    Stroke Father    Other Father        brain tumor-benign   Heart disease Sister    Heart disease Sister    Hypertension Sister    Colon cancer Neg Hx    Stomach cancer Neg Hx    Esophageal cancer Neg Hx    Pancreatic cancer Neg Hx    Liver disease Neg Hx    Social History   Tobacco Use   Smoking status: Former    Current packs/day: 0.00    Types: Cigarettes    Start date: 11/20/1976    Quit date: 1986    Years since quitting: 40.0   Smokeless tobacco: Never  Substance Use Topics   Alcohol use: Not Currently   Allergies[1] Current Outpatient Medications  Medication Sig Dispense Refill   amLODipine  (NORVASC ) 10 MG tablet Take 1 tablet (10 mg total) by mouth daily. 90 tablet 3   Ascorbic Acid  (VITAMIN C) 1000 MG tablet Take 1,000 mg by mouth daily.     aspirin  EC 81 MG tablet Take 1 tablet (81 mg total) by mouth daily. Swallow whole. 150 tablet 2   Carboxymethylcellulose Sod PF 0.25 % SOLN Place 1 drop into both eyes daily as needed (dry eyes).     carvedilol  (COREG ) 6.25 MG tablet Take 1 tablet by mouth in the morning and at bedtime. 180 tablet 3   cholecalciferol (VITAMIN D3) 25 MCG (1000 UNIT) tablet Take 1,000 Units by mouth daily.     clopidogrel  (PLAVIX ) 75 MG tablet Take 1 tablet (75 mg total) by mouth daily. 30 tablet 11   fluticasone  (FLONASE ) 50 MCG/ACT nasal spray Place 2 sprays into both nostrils daily. (Patient taking differently: Place 2 sprays into both nostrils at bedtime.) 16 g 6   GLUCOSAMINE-CHONDROITIN PO Take 2 tablets by mouth daily.     irbesartan  (AVAPRO ) 75 MG tablet Take 1 tablet (75 mg total) by mouth daily. 90 tablet 0   Lutein 20 MG CAPS Take 20 mg by mouth daily.     Magnesium 250 MG TABS Take 250 mg by mouth daily.     Multiple Vitamin (MULTIVITAMIN) tablet Take 1 tablet by mouth daily.     NON FORMULARY Pt uses a c-pap nightly     Nutritional Supplements (SALMON OIL) CAPS Take 1 capsule by mouth daily.     Saw Palmetto, Serenoa repens, (SAW PALMETTO PO) Take 2 tablets by mouth daily.     testosterone  cypionate (DEPOTESTOSTERONE CYPIONATE) 200 MG/ML injection Inject 100 mg into the muscle every 7 (seven) days.     triamcinolone  cream (KENALOG ) 0.1 % Apply 1 Application topically daily as needed (heat rash).     apixaban  (ELIQUIS ) 5 MG TABS tablet Take 1 tablet by mouth twice daily 180 tablet 1   No current facility-administered medications for this visit.   BP 130/75 (BP Location: Left Arm, Patient Position: Sitting, Cuff Size: Large)   Pulse 91   Ht 6' (1.829 m)   Wt 195 lb (88.5 kg)   SpO2 97%   BMI 26.45 kg/m   PHYSICAL EXAM:  BP 130/75 (BP Location: Left Arm, Patient Position: Sitting, Cuff Size: Large)   Pulse 91   Ht 6' (1.829 m)   Wt  195 lb (88.5 kg)   SpO2 97%   BMI 26.45 kg/m    Salient findings:  CN II-XII intact Bilateral EAC clear and TM intact with well pneumatized middle ear space Nose: Anterior rhinoscopy reveals  visualized septum relatively midline, bilateral IT with modest hypertrophy today.  Nasal endoscopy was indicated to better evaluate the nose and paranasal sinuses, given the patient's history and exam findings, and is detailed below. No lesions of oral cavity/oropharynx No obviously palpable neck masses/lymphadenopathy/thyromegaly No respiratory distress or stridor   PROCEDURE:  Prior to initiating any procedures, risks/benefits/alternatives were explained to the patient and verbal consent obtained. Diagnostic Nasal Endoscopy Pre-procedure diagnosis: Concern for chronic sinusitis Post-procedure diagnosis: same Indication: See pre-procedure diagnosis and physical exam above Complications: None apparent EBL: 0 mL Anesthesia: Lidocaine  4% and topical decongestant was topically sprayed in each nasal cavity  Description of Procedure:  Patient was identified. A rigid 30 degree endoscope was utilized to evaluate the sinonasal cavities, mucosa, sinus ostia and turbinates and septum.  Overall, signs of mucosal inflammation are not noted.  Also noted are large septal perforation which is more posterior and appears clean; there is clear significant middle turbinate hypertrophy(?) v/s polypoid middle turbinate which essentially obstructs both FSOT and narrows MM bilaterally. Does not appear to be an encephalocele (as the appearance)  No mucopurulence noted.  Right Middle meatus: no purulence Right SE Recess: clear Left MM: no purulence Left SE Recess: clear  CPT CODE -- 31231 - Mod 25   ASSESSMENT:  84 y.o. with:  1. Nasal mass   2. Other chronic sinusitis   3. Hypertrophy of both inferior nasal turbinates   4. Nasal obstruction   5. Nasal congestion   6. Nasal septal perforation    Does have b/l  essential pansinus opacification but he is not symptomatic. He also is currently on AP/AC with recent major surgical procedures and would like to avoid any further intervention if possible. Unclear what to make of the mass/MT lesion --- query if this is just hypertrophy after his septal perforation; cannot see obvious dehiscence of skull base but cuts were somewhat thick. No CSF leak sx. Discussed bx but patient deferred for now  PLAN: We've discussed issues and options today.  We reviewed the nasal endoscopy images together.  The risks, benefits and alternatives were discussed and questions answered.  He has elected to proceed with:  1) Will obtain MRI to further evaluate  2) Continue budesonide  rinses 3) Don't feel strongly re: abx/steroids since he is currently asymptomatic from CRS standpoint F/u 3 months, sooner depending on MRI results  See below regarding exact medications prescribed this encounter including dosages and route: No orders of the defined types were placed in this encounter.    Thank you for allowing me the opportunity to care for your patient. Please do not hesitate to contact me should you have any other questions.  Sincerely, Eldora Blanch, MD Otolaryngologist (ENT), [facility] Phone: [phone] Fax: [phone]  MDM:  [phone] I have personally spent 43 minutes involved in face-to-face and non-face-to-face activities for this patient on the day of the visit.  Professional time spent excludes any procedures performed but includes the following activities, in addition to those noted in the documentation: preparing to see the patient (review of outside documentation and results), performing a medically appropriate examination, counseling, documenting in the electronic health record, independently interpreting results (CT).   03/06/2024, 9:45 AM      [1]  Allergies Allergen Reactions   Contrast Media [Iodinated Contrast Media] Shortness Of  Breath, Rash and Other (See Comments)    First noted after Coronary CTA 02/24/20. Pt experienced wheezing, shortness of breath and diffuse rash/welts over face and trunk.  Bee Venom Other (See Comments)    Becomes light headed; faint. Has to take benadryl     Lisinopril Other (See Comments)    REACTION: severe GI upset requiring EGD--temporally related to lisinopril--tolerated micardis

## 2024-03-04 NOTE — Patient Instructions (Addendum)
 I have ordered an imaging study for you to complete prior to your next visit. Please call [facility] Scheduling at [phone] to schedule your imaging if you have not received a call within 24 hours. If you are unable to complete your imaging study prior to your next scheduled visit please call our office to let us  know.   Do it 2-3 weeks at least before you see Dr. Tobie

## 2024-03-09 ENCOUNTER — Ambulatory Visit (INDEPENDENT_AMBULATORY_CARE_PROVIDER_SITE_OTHER): Admitting: Otolaryngology

## 2024-03-09 ENCOUNTER — Other Ambulatory Visit: Payer: Self-pay

## 2024-03-09 DIAGNOSIS — I722 Aneurysm of renal artery: Secondary | ICD-10-CM

## 2024-04-01 ENCOUNTER — Ambulatory Visit (HOSPITAL_COMMUNITY)

## 2024-04-05 ENCOUNTER — Encounter: Admitting: Surgery

## 2024-04-09 ENCOUNTER — Ambulatory Visit: Admitting: Gastroenterology

## 2024-05-19 ENCOUNTER — Other Ambulatory Visit (HOSPITAL_COMMUNITY)

## 2024-06-03 ENCOUNTER — Ambulatory Visit (INDEPENDENT_AMBULATORY_CARE_PROVIDER_SITE_OTHER): Admitting: Otolaryngology
